# Patient Record
Sex: Female | Born: 1945 | Race: White | Hispanic: No | Smoking: Never smoker
Health system: Southern US, Community
[De-identification: ages and names within clinical notes are randomized; demographics above are authoritative.]

## PROBLEM LIST (undated history)

## (undated) DIAGNOSIS — E119 Type 2 diabetes mellitus without complications: Secondary | ICD-10-CM

## (undated) DIAGNOSIS — J45909 Unspecified asthma, uncomplicated: Secondary | ICD-10-CM

## (undated) DIAGNOSIS — F419 Anxiety disorder, unspecified: Secondary | ICD-10-CM

## (undated) DIAGNOSIS — I639 Cerebral infarction, unspecified: Secondary | ICD-10-CM

## (undated) DIAGNOSIS — I10 Essential (primary) hypertension: Secondary | ICD-10-CM

## (undated) HISTORY — PX: OTHER SURGICAL HISTORY: SHX169

## (undated) HISTORY — PX: CHOLECYSTECTOMY: SHX55

## (undated) HISTORY — DX: Anxiety disorder, unspecified: F41.9

---

## 2006-12-16 ENCOUNTER — Ambulatory Visit: Payer: Self-pay | Admitting: Family Medicine

## 2007-03-29 DIAGNOSIS — F419 Anxiety disorder, unspecified: Secondary | ICD-10-CM | POA: Insufficient documentation

## 2007-03-29 DIAGNOSIS — F411 Generalized anxiety disorder: Secondary | ICD-10-CM

## 2019-06-01 ENCOUNTER — Emergency Department (HOSPITAL_BASED_OUTPATIENT_CLINIC_OR_DEPARTMENT_OTHER): Payer: Medicare Other

## 2019-06-01 ENCOUNTER — Encounter (HOSPITAL_BASED_OUTPATIENT_CLINIC_OR_DEPARTMENT_OTHER): Payer: Self-pay | Admitting: *Deleted

## 2019-06-01 ENCOUNTER — Emergency Department (HOSPITAL_BASED_OUTPATIENT_CLINIC_OR_DEPARTMENT_OTHER)
Admission: EM | Admit: 2019-06-01 | Discharge: 2019-06-01 | Disposition: A | Discharge status: Home / Self Care | Payer: Medicare Other | Attending: Emergency Medicine | Admitting: Emergency Medicine

## 2019-06-01 ENCOUNTER — Emergency Department (HOSPITAL_COMMUNITY): Payer: Medicare Other

## 2019-06-01 ENCOUNTER — Other Ambulatory Visit: Payer: Self-pay

## 2019-06-01 DIAGNOSIS — W01190A Fall on same level from slipping, tripping and stumbling with subsequent striking against furniture, initial encounter: Secondary | ICD-10-CM | POA: Diagnosis not present

## 2019-06-01 DIAGNOSIS — J45909 Unspecified asthma, uncomplicated: Secondary | ICD-10-CM | POA: Diagnosis not present

## 2019-06-01 DIAGNOSIS — M79674 Pain in right toe(s): Secondary | ICD-10-CM | POA: Diagnosis not present

## 2019-06-01 DIAGNOSIS — E860 Dehydration: Secondary | ICD-10-CM | POA: Diagnosis not present

## 2019-06-01 DIAGNOSIS — I10 Essential (primary) hypertension: Secondary | ICD-10-CM | POA: Diagnosis not present

## 2019-06-01 DIAGNOSIS — N390 Urinary tract infection, site not specified: Secondary | ICD-10-CM | POA: Diagnosis not present

## 2019-06-01 DIAGNOSIS — Y939 Activity, unspecified: Secondary | ICD-10-CM | POA: Insufficient documentation

## 2019-06-01 DIAGNOSIS — Z79899 Other long term (current) drug therapy: Secondary | ICD-10-CM | POA: Diagnosis not present

## 2019-06-01 DIAGNOSIS — R4182 Altered mental status, unspecified: Secondary | ICD-10-CM | POA: Diagnosis present

## 2019-06-01 DIAGNOSIS — Y999 Unspecified external cause status: Secondary | ICD-10-CM | POA: Insufficient documentation

## 2019-06-01 DIAGNOSIS — Y929 Unspecified place or not applicable: Secondary | ICD-10-CM | POA: Diagnosis not present

## 2019-06-01 HISTORY — DX: Cerebral infarction, unspecified: I63.9

## 2019-06-01 HISTORY — DX: Unspecified asthma, uncomplicated: J45.909

## 2019-06-01 HISTORY — DX: Type 2 diabetes mellitus without complications: E11.9

## 2019-06-01 HISTORY — DX: Essential (primary) hypertension: I10

## 2019-06-01 LAB — URINALYSIS, ROUTINE W REFLEX MICROSCOPIC
Bilirubin Urine: NEGATIVE
Glucose, UA: NEGATIVE mg/dL
Ketones, ur: NEGATIVE mg/dL
Nitrite: NEGATIVE
Protein, ur: NEGATIVE mg/dL
Specific Gravity, Urine: 1.01 (ref 1.005–1.030)
pH: 7.5 (ref 5.0–8.0)

## 2019-06-01 LAB — COMPREHENSIVE METABOLIC PANEL
ALT: 22 U/L (ref 0–44)
AST: 32 U/L (ref 15–41)
Albumin: 4.1 g/dL (ref 3.5–5.0)
Alkaline Phosphatase: 85 U/L (ref 38–126)
Anion gap: 16 — ABNORMAL HIGH (ref 5–15)
BUN: 7 mg/dL — ABNORMAL LOW (ref 8–23)
CO2: 20 mmol/L — ABNORMAL LOW (ref 22–32)
Calcium: 9.3 mg/dL (ref 8.9–10.3)
Chloride: 97 mmol/L — ABNORMAL LOW (ref 98–111)
Creatinine, Ser: 0.58 mg/dL (ref 0.44–1.00)
GFR calc Af Amer: >60 mL/min (ref >60)
GFR calc non Af Amer: >60 mL/min (ref >60)
Glucose, Bld: 185 mg/dL — ABNORMAL HIGH (ref 70–99)
Potassium: 3.2 mmol/L — ABNORMAL LOW (ref 3.5–5.1)
Sodium: 133 mmol/L — ABNORMAL LOW (ref 135–145)
Total Bilirubin: 0.4 mg/dL (ref 0.3–1.2)
Total Protein: 7.9 g/dL (ref 6.5–8.1)

## 2019-06-01 LAB — CBC WITH DIFFERENTIAL/PLATELET
Abs Immature Granulocytes: 0.09 10*3/uL — ABNORMAL HIGH (ref 0.00–0.07)
Basophils Absolute: 0 10*3/uL (ref 0.0–0.1)
Basophils Relative: 0 %
Eosinophils Absolute: 0 10*3/uL (ref 0.0–0.5)
Eosinophils Relative: 0 %
HCT: 47.5 % — ABNORMAL HIGH (ref 36.0–46.0)
Hemoglobin: 15.5 g/dL — ABNORMAL HIGH (ref 12.0–15.0)
Immature Granulocytes: 0 %
Lymphocytes Relative: 7 %
Lymphs Abs: 1.4 10*3/uL (ref 0.7–4.0)
MCH: 27.9 pg (ref 26.0–34.0)
MCHC: 32.6 g/dL (ref 30.0–36.0)
MCV: 85.6 fL (ref 80.0–100.0)
Monocytes Absolute: 1.4 10*3/uL — ABNORMAL HIGH (ref 0.1–1.0)
Monocytes Relative: 7 %
Neutro Abs: 17.2 10*3/uL — ABNORMAL HIGH (ref 1.7–7.7)
Neutrophils Relative %: 86 %
Platelets: 394 10*3/uL (ref 150–400)
RBC: 5.55 MIL/uL — ABNORMAL HIGH (ref 3.87–5.11)
RDW: 13.3 % (ref 11.5–15.5)
WBC: 20.1 10*3/uL — ABNORMAL HIGH (ref 4.0–10.5)
nRBC: 0 % (ref 0.0–0.2)

## 2019-06-01 LAB — URINALYSIS, MICROSCOPIC (REFLEX)

## 2019-06-01 LAB — CBG MONITORING, ED: Glucose-Capillary: 181 mg/dL — ABNORMAL HIGH (ref 70–99)

## 2019-06-01 LAB — LACTIC ACID, PLASMA: Lactic Acid, Venous: 1.2 mmol/L (ref 0.5–1.9)

## 2019-06-01 IMAGING — DX DG CHEST 2V
2 series · 2 of 2 positions shown · non-contrast
Comparison: None.

CLINICAL DATA: Cough.

EXAM:
CHEST - 2 VIEW

[chest lat]
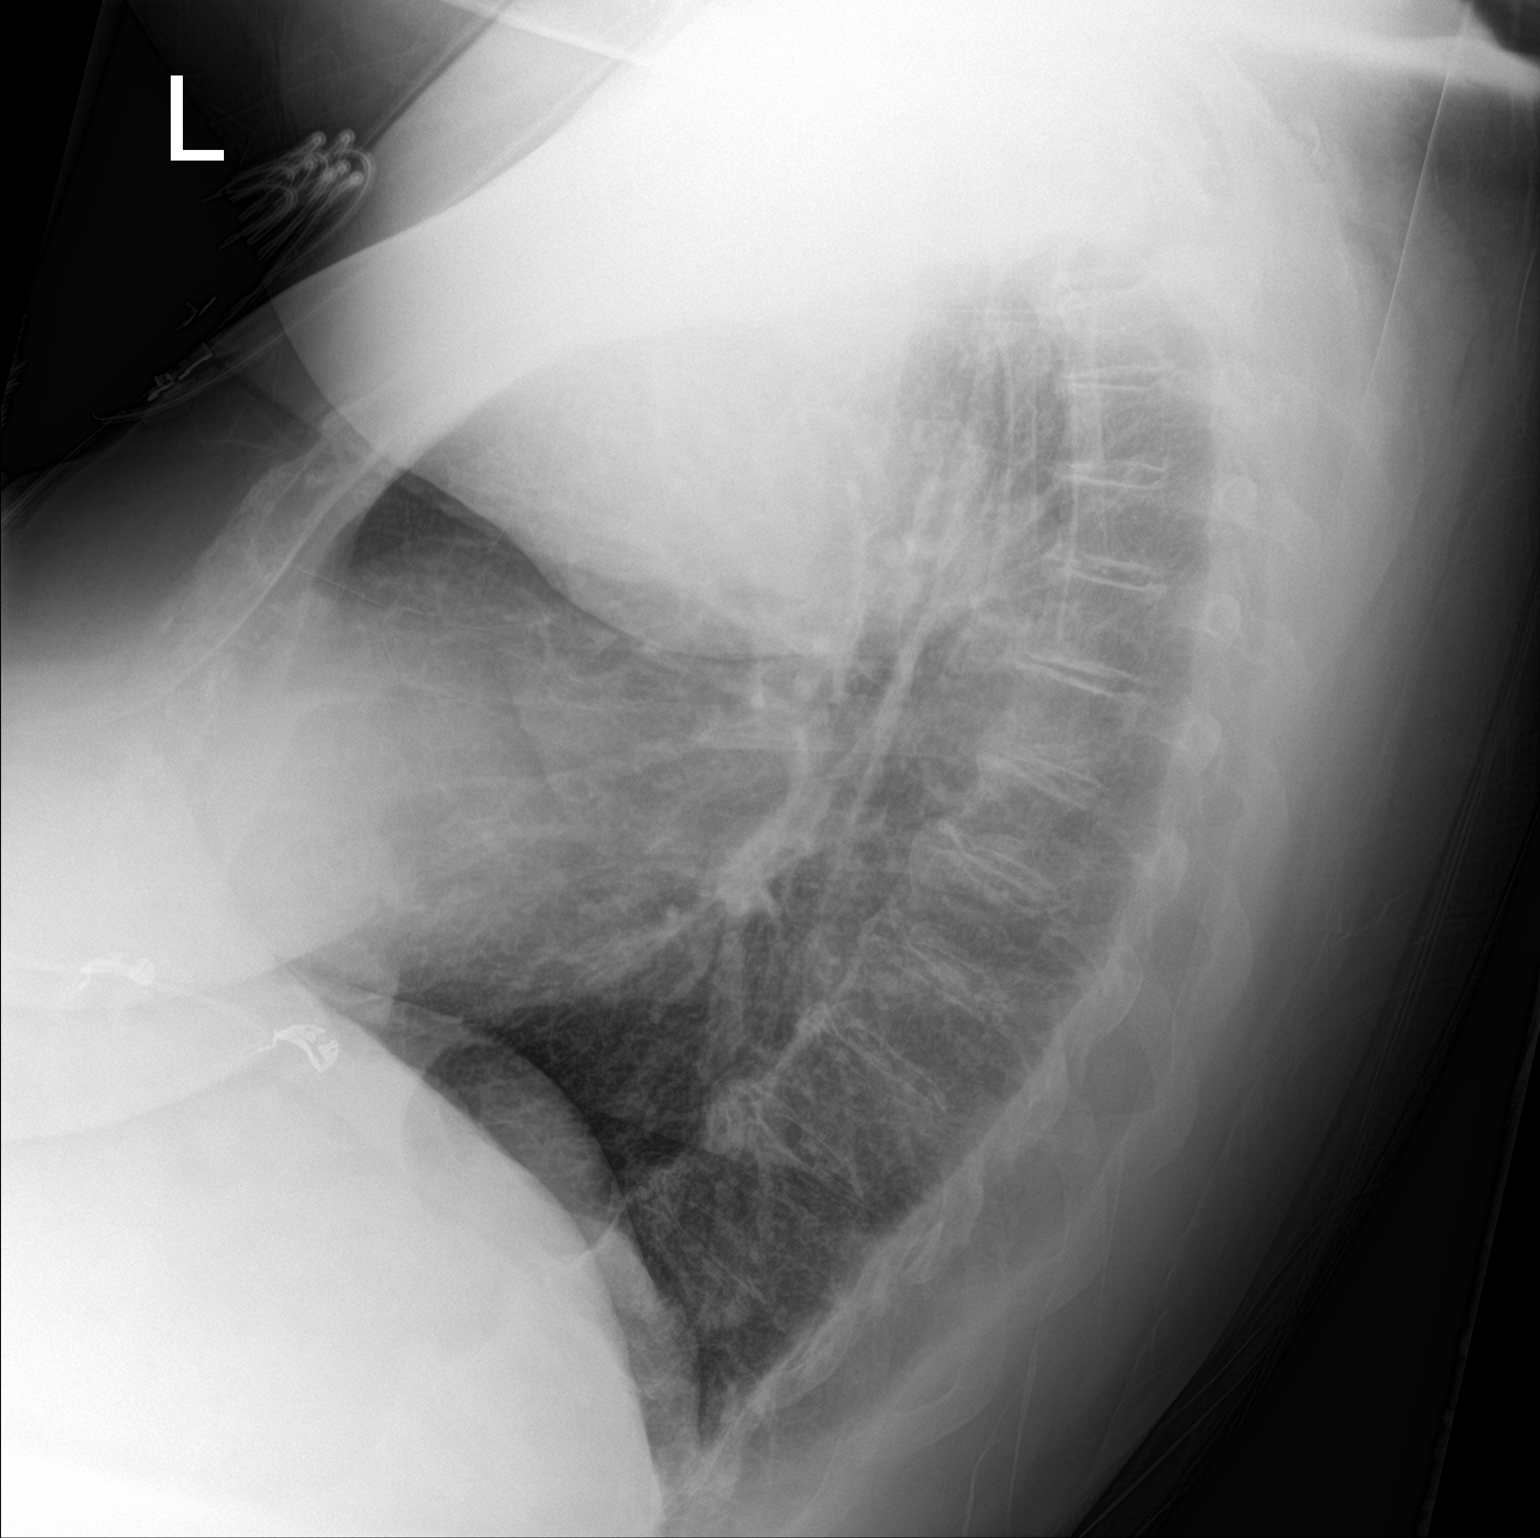

[chest ap strecther]
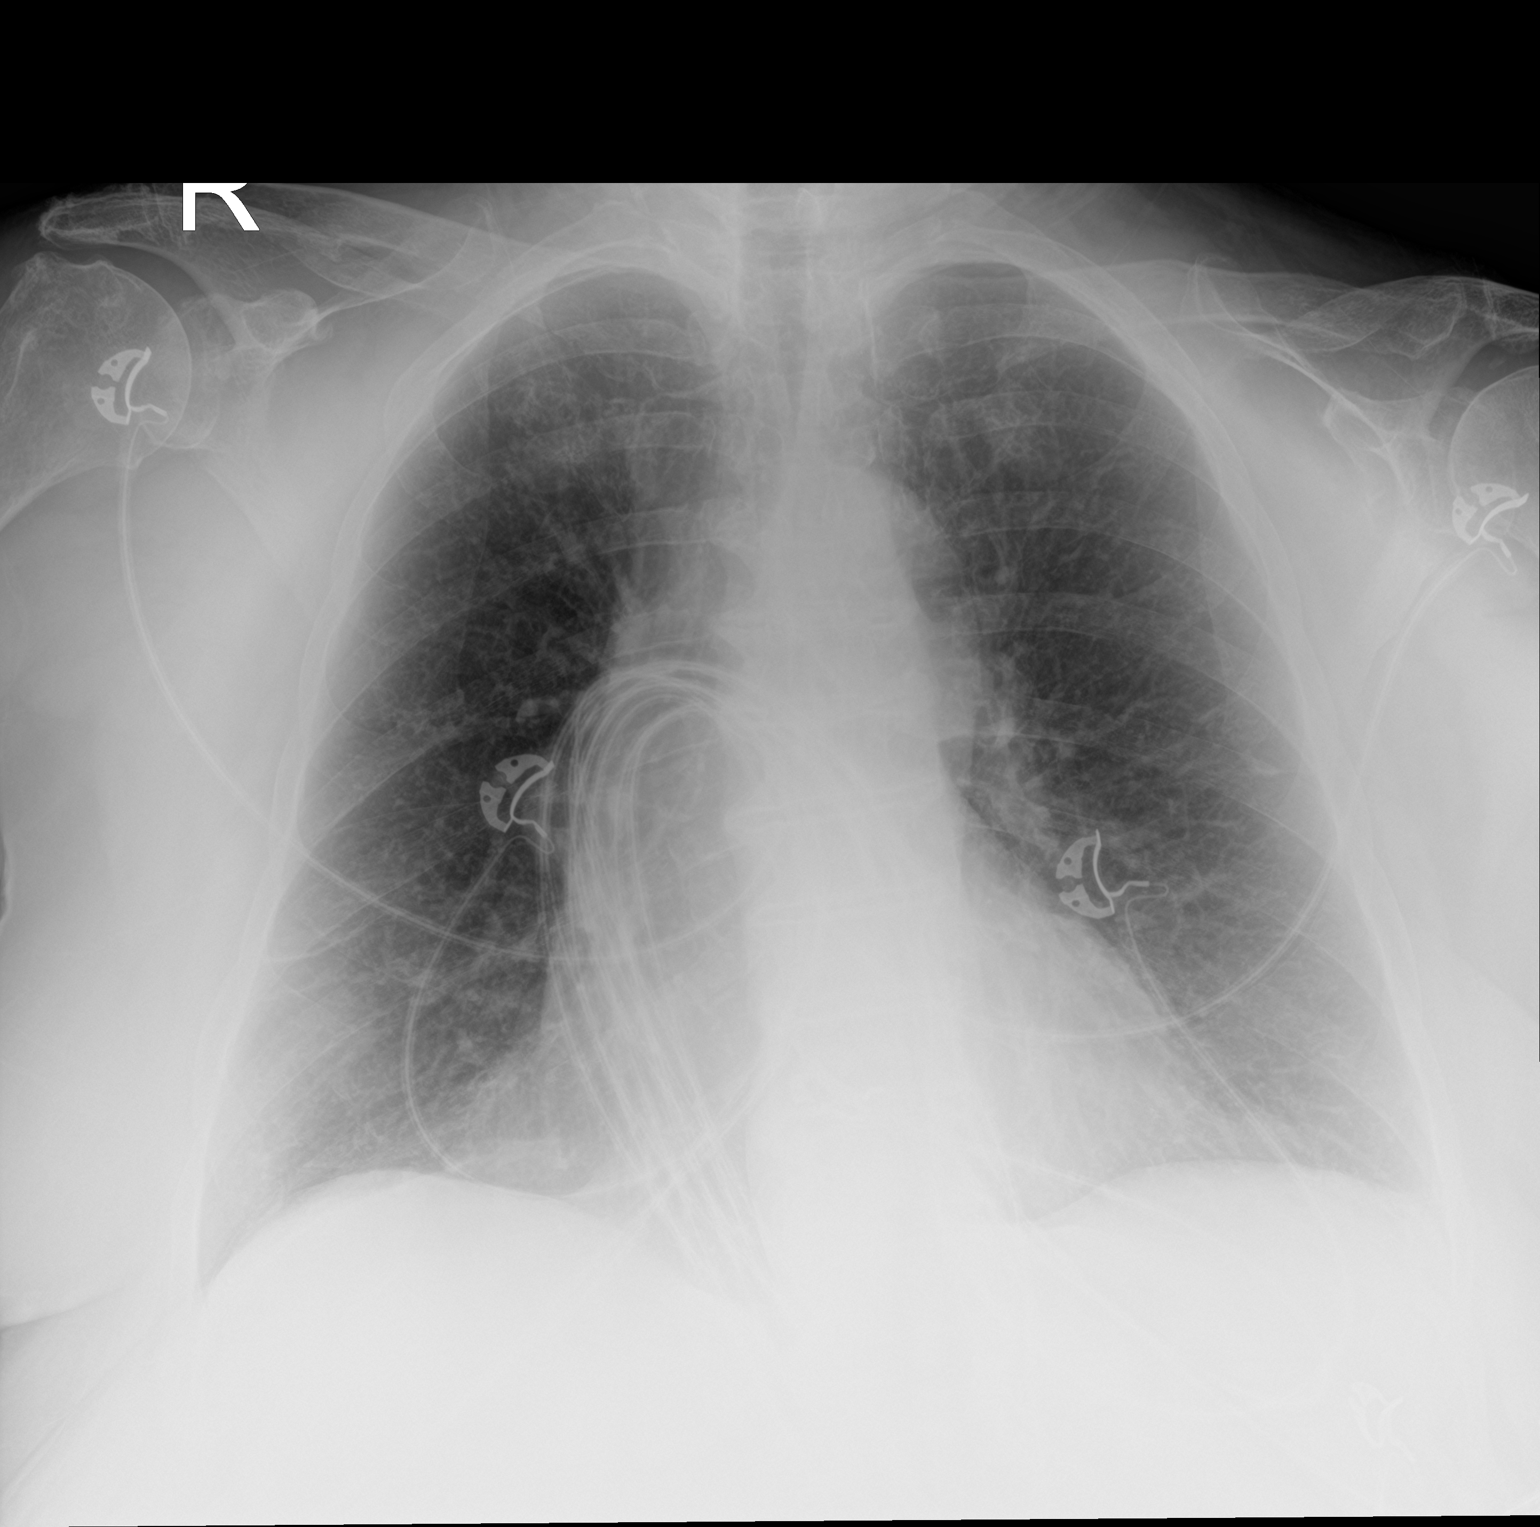

[2 of 2 positions shown; findings below may reference images not displayed]

FINDINGS: Heart size and pulmonary vascularity are normal and the lungs are
clear. No effusions. No significant bone abnormality.
IMPRESSION: No active cardiopulmonary disease.

## 2019-06-01 IMAGING — DX DG TOE 3RD 2+V*R*
3 series · 3 of 3 positions shown · non-contrast
Comparison: None.

CLINICAL DATA: Injure right third toe.

EXAM:
RIGHT THIRD TOE

[foot ap]
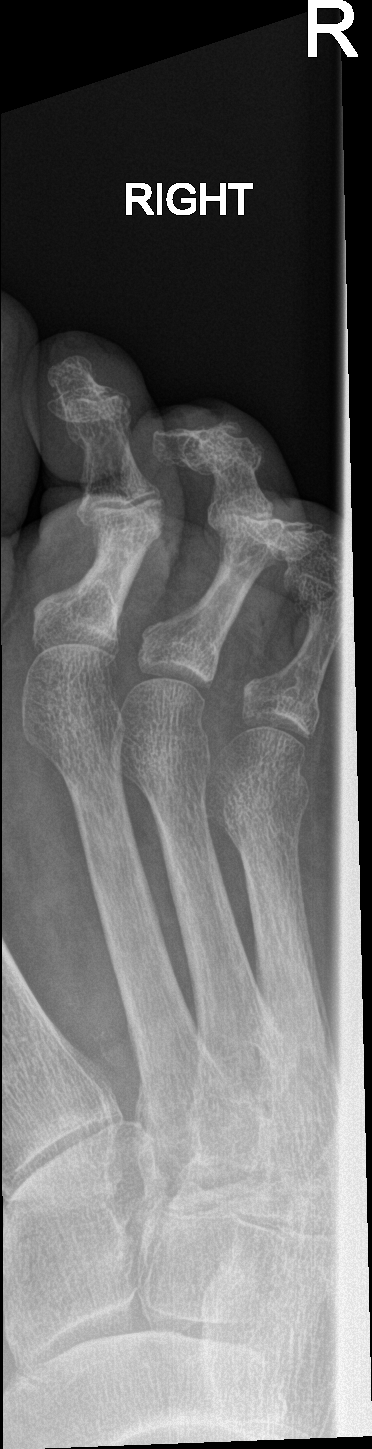

[foot lat]
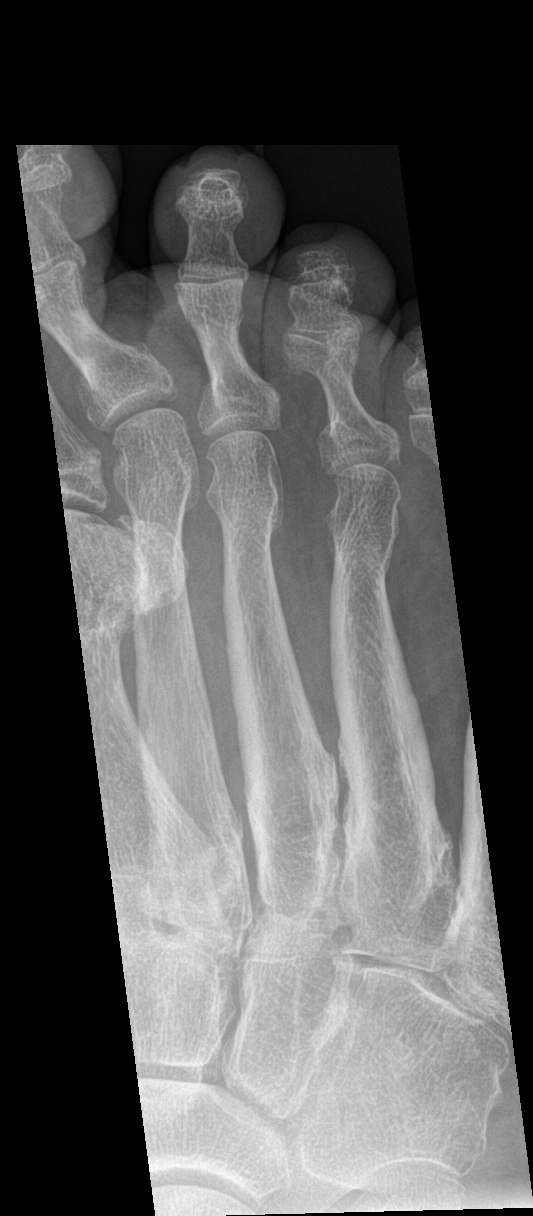

[foot obl]
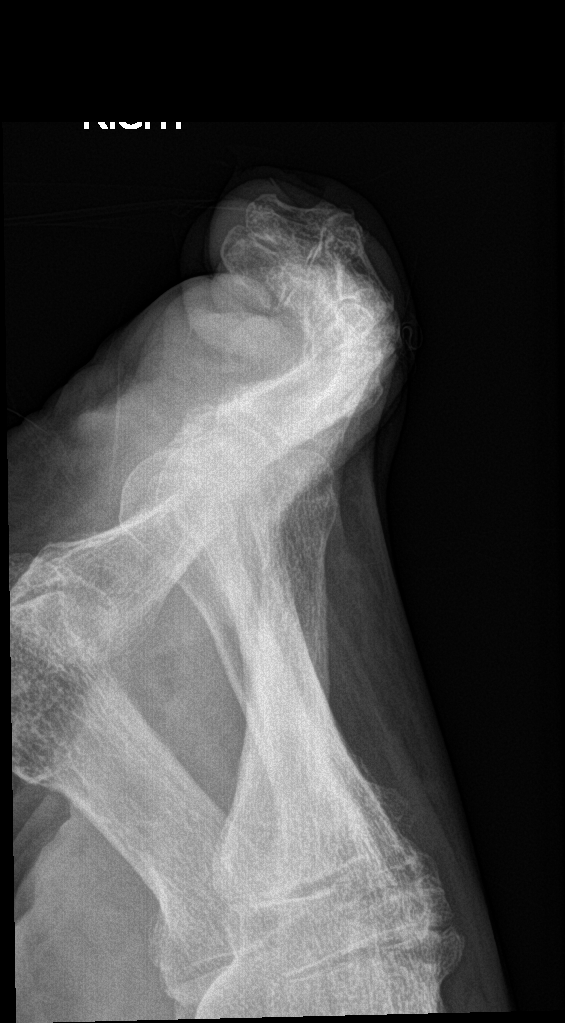

[3 of 3 positions shown; findings below may reference images not displayed]

FINDINGS: There is no evidence of fracture or dislocation. Exam is limited as
the toe is held in flexion on all views. Soft tissues are
unremarkable.
IMPRESSION: No gross acute fracture or dislocation.

## 2019-06-01 MED ORDER — LORAZEPAM 2 MG/ML IJ SOLN
1.0000 mg | Freq: Once | INTRAMUSCULAR | Status: AC
  Administered 2019-06-01: 1 mg via INTRAVENOUS
  Filled 2019-06-01: qty 1

## 2019-06-01 MED ORDER — SODIUM CHLORIDE 0.9 % IV BOLUS
1000.0000 mL | Freq: Once | INTRAVENOUS | Status: AC
  Administered 2019-06-01: 1000 mL via INTRAVENOUS

## 2019-06-01 MED ORDER — CEPHALEXIN 500 MG PO CAPS: 1000.0000 mg | ORAL_CAPSULE | Freq: Two times a day (BID) | ORAL | 0 refills | Status: DC

## 2019-06-01 NOTE — Discharge Instructions (Signed)
Return here as needed.  We will treat you for urinary tract infection.  Follow-up with your doctor.

## 2019-06-01 NOTE — ED Triage Notes (Signed)
Fell this morning, right middle toenail came off.  Patient's daughter stated that her mother is disoriented.

## 2019-06-01 NOTE — Progress Notes (Signed)
TOC CM received referral for [facility], contacted daughter, [name] left HIPAA compliant message for return call. [place], [place] ED TOC CM [phone]

## 2019-06-01 NOTE — ED Notes (Signed)
ED Provider at bedside. 

## 2019-06-01 NOTE — ED Notes (Signed)
Notified RN Elba Barman

## 2019-06-01 NOTE — ED Notes (Signed)
Dressing applied to right middle toe.  Patient is always crying and anxious.  Daughter at bedside.

## 2019-06-02 ENCOUNTER — Telehealth: Payer: Self-pay | Admitting: *Deleted

## 2019-06-02 NOTE — Telephone Encounter (Signed)
TOC CM attempted call to pt and number is not correct. Contacted dtr's number and left HIPAA compliant message for return call. Need to set up [facility]. [place], [place] ED TOC CM [phone]

## 2019-06-02 NOTE — Telephone Encounter (Signed)
TOC CM spoke to pt's dtr, [name]. Offered choice for [facility] and pt dtr agreeable to [place] for [facility]. Pt has a cane at home. States she recently moved from [place] and does not have a PCP. Will follow up with [facility] in [place] to arrange follow up appt. Will send notification to Dr Wynetta Emery to see if she will be willing to sign [place] orders until pt has established with a PCP in [place]. [place], [place] ED TOC CM [phone]

## 2019-06-04 ENCOUNTER — Telehealth: Payer: Self-pay | Admitting: *Deleted

## 2019-06-04 NOTE — Telephone Encounter (Signed)
TOC CM spoke to dtr, [name] to give information on MD appt on 06/06/2019 at 2 pm with Dr Luetta Nutting, at [place] [facility]. Updated dtr on [facility] with [place]. Sent message to [facility] liaison, Melissa about appt on 06/06/2019 . [place], [place] ED TOC CM [phone]

## 2019-06-05 NOTE — ED Provider Notes (Signed)
[facility] EMERGENCY DEPARTMENT Provider Note   CSN: [id number] Arrival date & time: 06/01/19  0909     History   Chief Complaint Chief Complaint  Patient presents with  . Fall  . Altered Mental Status    HPI Rebecca Barnes is a 73 y.o. female.     HPI Patient presents to the emergency department with an acute anxiety reaction and a fall where she struck her right third toe against the bed rail.  She states that the toe nail came off.  Patient states she is very upset because she has not taken her medicines today.  She states that nothing seems to make her condition better or worse.  She states she has no other complaints.  She states she did not fall and hit her head and has no other areas of pain other than her toe.  Patient has very upset and not giving me much history outside of this.  The daughter helps provide history for the patient as well. Past Medical History:  Diagnosis Date  . Asthma   . Diabetes mellitus without complication (HCC)   . Hypertension   . Stroke [facility])     Patient Active Problem List   Diagnosis Date Noted  . ANXIETY 03/29/2007    Past Surgical History:  Procedure Laterality Date  . arm fracture     arm surgery  . CHOLECYSTECTOMY       OB History    Gravida  1   Para      Term      Preterm      AB      Living  1     SAB      TAB      Ectopic      Multiple      Live Births               Home Medications    Prior to Admission medications   Medication Sig Start Date End Date Taking? Authorizing Provider  amLODipine (NORVASC) 5 MG tablet Take 5 mg by mouth daily.   Yes [provider]  busPIRone (BUSPAR) 5 MG tablet Take by mouth. 10/12/18  Yes [provider]  clonazePAM (KLONOPIN) 0.5 MG tablet Take 0.5 mg by mouth daily.   Yes [provider]  FLUoxetine (PROZAC) 20 MG capsule Take by mouth. 08/04/18  Yes [provider]  losartan (COZAAR) 100 MG tablet TAKE 1 TABLET  BY MOUTH EVERY DAY 01/08/19  Yes [provider]  metFORMIN (GLUCOPHAGE) 1000 MG tablet Take by mouth. 12/16/17  Yes [provider]  Multiple Vitamin (MULTI-VITAMIN) tablet Take by mouth.   Yes [provider]  rosuvastatin (CRESTOR) 5 MG tablet Take by mouth. 12/16/17  Yes [provider]  cephALEXin (KEFLEX) 500 MG capsule Take 2 capsules (1,000 mg total) by mouth 2 (two) times daily. 06/01/19   Kendrick Remigio, Cristal Deer, PA-C  fluticasone furoate-vilanterol (BREO ELLIPTA) 100-25 MCG/INH AEPB Inhale into the lungs. 03/17/18   [provider]    Family History Family History  Problem Relation Age of Onset  . Cancer Mother     Social History Social History   Tobacco Use  . Smoking status: Never Smoker  . Smokeless tobacco: Never Used  Substance Use Topics  . Alcohol use: Never    Frequency: Never  . Drug use: Yes    Types: Marijuana    Comment: ocassionally     Allergies   Patient has no known  allergies.   Review of Systems Review of Systems Level 5 caveat applies due to severe anxiety.  Physical Exam Updated Vital Signs BP (!) 143/80 (BP Location: Right Arm)   Pulse 86   Temp 98.3 F (36.8 C) (Oral)   Resp 16   Ht 5\' 6"  (1.676 m)   Wt 116.7 kg   SpO2 99%   BMI 41.53 kg/m   Physical Exam Vitals signs and nursing note reviewed.  Constitutional:      General: She is not in acute distress.    Appearance: She is well-developed.  HENT:     Head: Normocephalic and atraumatic.  Eyes:     Pupils: Pupils are equal, round, and reactive to light.  Neck:     Musculoskeletal: Normal range of motion and neck supple.  Cardiovascular:     Rate and Rhythm: Normal rate and regular rhythm.     Heart sounds: Normal heart sounds. No murmur. No friction rub. No gallop.   Pulmonary:     Effort: Pulmonary effort is normal. No respiratory distress.     Breath sounds: Normal breath sounds. No wheezing.  Abdominal:     General: Bowel sounds  are normal. There is no distension.     Palpations: Abdomen is soft.     Tenderness: There is no abdominal tenderness.  Skin:    General: Skin is warm and dry.     Capillary Refill: Capillary refill takes less than 2 seconds.     Findings: No erythema or rash.  Neurological:     Mental Status: She is alert and oriented to person, place, and time.     Motor: No abnormal muscle tone.     Coordination: Coordination normal.  Psychiatric:        Mood and Affect: Mood is anxious. Affect is tearful.        Behavior: Behavior is agitated.      ED Treatments / Results  Labs (all labs ordered are listed, but only abnormal results are displayed) Labs Reviewed  CBC WITH DIFFERENTIAL/PLATELET - Abnormal; Notable for the following components:      Result Value   WBC 20.1 (*)    RBC 5.55 (*)    Hemoglobin 15.5 (*)    HCT 47.5 (*)    Neutro Abs 17.2 (*)    Monocytes Absolute 1.4 (*)    Abs Immature Granulocytes 0.09 (*)    All other components within normal limits  COMPREHENSIVE METABOLIC PANEL - Abnormal; Notable for the following components:   Sodium 133 (*)    Potassium 3.2 (*)    Chloride 97 (*)    CO2 20 (*)    Glucose, Bld 185 (*)    BUN 7 (*)    Anion gap 16 (*)    All other components within normal limits  URINALYSIS, ROUTINE W REFLEX MICROSCOPIC - Abnormal; Notable for the following components:   Hgb urine dipstick TRACE (*)    Leukocytes,Ua TRACE (*)    All other components within normal limits  URINALYSIS, MICROSCOPIC (REFLEX) - Abnormal; Notable for the following components:   Bacteria, UA RARE (*)    All other components within normal limits  CBG MONITORING, ED - Abnormal; Notable for the following components:   Glucose-Capillary 181 (*)    All other components within normal limits  LACTIC ACID, PLASMA    EKG EKG Interpretation  Date/Time:  Friday June 01 2019 09:28:40 EDT Ventricular Rate:  112 PR Interval:    QRS Duration: 100 QT  Interval:  332 QTC  Calculation: 400 R Axis:   4 Text Interpretation:  Sinus tachycardia Consider right atrial enlargement Baseline wander in lead(s) II III aVR aVF V1 V2 V4 No STEMI  Confirmed by Octaviano Glow [phone]) on 06/01/2019 9:49:00 AM   Radiology No results found.  Procedures Procedures (including critical care time)  Medications Ordered in ED Medications  LORazepam (ATIVAN) injection 1 mg (1 mg Intravenous Given 06/01/19 1107)  sodium chloride 0.9 % bolus 1,000 mL (0 mLs Intravenous Stopped 06/01/19 1500)     Initial Impression / Assessment and Plan / ED Course  I have reviewed the triage vital signs and the nursing notes.  Pertinent labs & imaging results that were available during my care of the patient were reviewed by me and considered in my medical decision making (see chart for details).        Significantly calm down over her visit here in the emergency department her entire work-up today was negative other than a urinary tract infection.  I feel the patient is vastly improved and stable for discharge home.  Her vital signs have normalized and she is feeling better per her report.  Patient to be referred back to her primary care doctor. Final Clinical Impressions(s) / ED Diagnoses   Final diagnoses:  Lower urinary tract infectious disease  Dehydration    ED Discharge Orders         Ordered    cephALEXin (KEFLEX) 500 MG capsule  2 times daily     06/01/19 [address] PA-C 06/05/19 1622    Wyvonnia Dusky, MD 06/05/19 2040

## 2019-06-06 ENCOUNTER — Ambulatory Visit (INDEPENDENT_AMBULATORY_CARE_PROVIDER_SITE_OTHER): Payer: Medicare Other | Admitting: Family Medicine

## 2019-06-06 ENCOUNTER — Other Ambulatory Visit: Payer: Self-pay

## 2019-06-06 ENCOUNTER — Encounter: Payer: Self-pay | Admitting: Family Medicine

## 2019-06-06 VITALS — HR 94 | Temp 98.1°F | Ht 65.0 in | Wt 248.8 lb

## 2019-06-06 DIAGNOSIS — R209 Unspecified disturbances of skin sensation: Secondary | ICD-10-CM

## 2019-06-06 DIAGNOSIS — D72829 Elevated white blood cell count, unspecified: Secondary | ICD-10-CM | POA: Diagnosis not present

## 2019-06-06 DIAGNOSIS — Z23 Encounter for immunization: Secondary | ICD-10-CM

## 2019-06-06 DIAGNOSIS — F418 Other specified anxiety disorders: Secondary | ICD-10-CM

## 2019-06-06 DIAGNOSIS — G3281 Cerebellar ataxia in diseases classified elsewhere: Secondary | ICD-10-CM

## 2019-06-06 DIAGNOSIS — R4789 Other speech disturbances: Secondary | ICD-10-CM

## 2019-06-06 DIAGNOSIS — E559 Vitamin D deficiency, unspecified: Secondary | ICD-10-CM | POA: Diagnosis not present

## 2019-06-06 DIAGNOSIS — E119 Type 2 diabetes mellitus without complications: Secondary | ICD-10-CM

## 2019-06-06 DIAGNOSIS — R531 Weakness: Secondary | ICD-10-CM

## 2019-06-06 LAB — COMPREHENSIVE METABOLIC PANEL
ALT: 31 U/L (ref 0–35)
AST: 37 U/L (ref 0–37)
Albumin: 4.1 g/dL (ref 3.5–5.2)
Alkaline Phosphatase: 79 U/L (ref 39–117)
BUN: 9 mg/dL (ref 6–23)
CO2: 24 mEq/L (ref 19–32)
Calcium: 9.9 mg/dL (ref 8.4–10.5)
Chloride: 94 mEq/L — ABNORMAL LOW (ref 96–112)
Creatinine, Ser: 0.55 mg/dL (ref 0.40–1.20)
GFR: 108.31 mL/min (ref >60.00)
Glucose, Bld: 119 mg/dL — ABNORMAL HIGH (ref 70–99)
Potassium: 3.6 mEq/L (ref 3.5–5.1)
Sodium: 129 mEq/L — ABNORMAL LOW (ref 135–145)
Total Bilirubin: 0.4 mg/dL (ref 0.2–1.2)
Total Protein: 7.5 g/dL (ref 6.0–8.3)

## 2019-06-06 LAB — LIPID PANEL
Cholesterol: 198 mg/dL (ref 0–200)
HDL: 72.2 mg/dL (ref >39.00)
LDL Cholesterol: 91 mg/dL (ref 0–99)
NonHDL: 126.15
Total CHOL/HDL Ratio: 3
Triglycerides: 178 mg/dL — ABNORMAL HIGH (ref 0.0–149.0)
VLDL: 35.6 mg/dL (ref 0.0–40.0)

## 2019-06-06 LAB — CBC WITH DIFFERENTIAL/PLATELET
Basophils Absolute: 0 10*3/uL (ref 0.0–0.1)
Basophils Relative: 0.4 % (ref 0.0–3.0)
Eosinophils Absolute: 0.1 10*3/uL (ref 0.0–0.7)
Eosinophils Relative: 1.2 % (ref 0.0–5.0)
HCT: 42.3 % (ref 36.0–46.0)
Hemoglobin: 14.5 g/dL (ref 12.0–15.0)
Lymphocytes Relative: 27.9 % (ref 12.0–46.0)
Lymphs Abs: 3.1 10*3/uL (ref 0.7–4.0)
MCHC: 34.2 g/dL (ref 30.0–36.0)
MCV: 84.2 fl (ref 78.0–100.0)
Monocytes Absolute: 0.9 10*3/uL (ref 0.1–1.0)
Monocytes Relative: 8.2 % (ref 3.0–12.0)
Neutro Abs: 6.8 10*3/uL (ref 1.4–7.7)
Neutrophils Relative %: 62.3 % (ref 43.0–77.0)
Platelets: 392 10*3/uL (ref 150.0–400.0)
RBC: 5.03 Mil/uL (ref 3.87–5.11)
RDW: 13.4 % (ref 11.5–15.5)
WBC: 11 10*3/uL — ABNORMAL HIGH (ref 4.0–10.5)

## 2019-06-06 LAB — VITAMIN B12: Vitamin B-12: 319 pg/mL (ref 211–911)

## 2019-06-06 LAB — HEMOGLOBIN A1C: Hgb A1c MFr Bld: 7.4 % — ABNORMAL HIGH (ref 4.6–6.5)

## 2019-06-06 LAB — TSH: TSH: 1.53 u[IU]/mL (ref 0.35–4.50)

## 2019-06-06 LAB — VITAMIN D 25 HYDROXY (VIT D DEFICIENCY, FRACTURES): VITD: 39.94 ng/mL (ref 30.00–100.00)

## 2019-06-06 MED ORDER — CLONAZEPAM 0.5 MG PO TABS: 0.5000 mg | ORAL_TABLET | Freq: Two times a day (BID) | ORAL | 2 refills | Status: DC | PRN

## 2019-06-06 MED ORDER — FLUOXETINE HCL 20 MG PO CAPS: 20.0000 mg | ORAL_CAPSULE | Freq: Every day | ORAL | 3 refills | Status: DC

## 2019-06-06 NOTE — Patient Instructions (Signed)
It was very nice to meet you today! We'll be in touch with lab results You will be contacted to schedule MRI.   Please contact us with any questions.

## 2019-06-07 ENCOUNTER — Encounter: Payer: Self-pay | Admitting: Family Medicine

## 2019-06-07 DIAGNOSIS — R209 Unspecified disturbances of skin sensation: Secondary | ICD-10-CM | POA: Insufficient documentation

## 2019-06-07 DIAGNOSIS — E1351 Other specified diabetes mellitus with diabetic peripheral angiopathy without gangrene: Secondary | ICD-10-CM | POA: Insufficient documentation

## 2019-06-07 DIAGNOSIS — R531 Weakness: Secondary | ICD-10-CM | POA: Insufficient documentation

## 2019-06-07 DIAGNOSIS — D72829 Elevated white blood cell count, unspecified: Secondary | ICD-10-CM | POA: Insufficient documentation

## 2019-06-07 DIAGNOSIS — F418 Other specified anxiety disorders: Secondary | ICD-10-CM | POA: Insufficient documentation

## 2019-06-07 DIAGNOSIS — E119 Type 2 diabetes mellitus without complications: Secondary | ICD-10-CM | POA: Insufficient documentation

## 2019-06-07 DIAGNOSIS — R4789 Other speech disturbances: Secondary | ICD-10-CM | POA: Insufficient documentation

## 2019-06-07 NOTE — Assessment & Plan Note (Signed)
-  Some improvement with tx of UTI.  Has HH PT and nursing ordered.

## 2019-06-07 NOTE — Assessment & Plan Note (Signed)
-  Recheck WBC after starting tx from UTI, likely from combination of infection and hemoconcentration.

## 2019-06-07 NOTE — Assessment & Plan Note (Signed)
-  Continue fluoxetine with buspar and clonazepam as needed.  -Given information of [place] behavioral health counseling.

## 2019-06-07 NOTE — Assessment & Plan Note (Signed)
-  Update a1c today.  -Check b12 given sensation disturbance and gait issues with long term metformin use.

## 2019-06-07 NOTE — Progress Notes (Signed)
Rebecca Barnes - 73 y.o. female MRN [id number]  Date of birth: Aug 10, 1946  Subjective Chief Complaint  Patient presents with  . Establish Care    Pt had a bad fall last friday, and now she's having more falls than usual.    HPI Rebecca Barnes is a 73 y.o. female with history of HTN, T2DM, asthma and anxiety with depression here today for initial visit and hospital f/u.  She recently move from [place] to [place] and is living with her daughter Rebecca Barnes who accompanies her today.  She was recently in the hospital due to fall and confusion. She was found to have UA consistent with UTI and elevated WBC count.  She was started on cephalexin and has had some improvement since starting this.  She does have some continued generalzed weakness and trouble with word findings, mild confusion at times.  She does have anxiety and this is making this a little worse, they are interested in having her see a therapist.  She does take clonazepam as needed but she has been on this for several years without side effects and dosage has not changed. She denies urinary symptoms at this time.  She has not had any hypoglycemia and BP has been well controlled. 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  ROS:  A comprehensive ROS was completed and negative except as noted per HPI    Allergies  Allergen Reactions  . Sitagliptin     Other reaction(s): DIZZINESS. (JANUVIA)  . Sulfa Antibiotics     (SULFA)    Past Medical History:  Diagnosis Date  . Anxiety   . Asthma   . Diabetes mellitus without complication ([place])   . Hypertension   . Stroke [facility])     Past Surgical History:  Procedure Laterality Date  . arm fracture     arm surgery  . CHOLECYSTECTOMY      Social History   Socioeconomic History  . Marital status: Married    Spouse name: Not on file  . Number of children: Not on file  . Years of education: Not on file  . Highest education level: Not on file   Occupational History  . Not on file  Social Needs  . Financial resource strain: Not on file  . Food insecurity    Worry: Not on file    Inability: Not on file  . Transportation needs    Medical: Not on file    Non-medical: Not on file  Tobacco Use  . Smoking status: Never Smoker  . Smokeless tobacco: Never Used  Substance and Sexual Activity  . Alcohol use: Never    Frequency: Never  . Drug use: Yes    Types: Marijuana    Comment: ocassionally  . Sexual activity: Not Currently  Lifestyle  . Physical activity    Days per week: Not on file    Minutes per session: Not on file  . Stress: Not on file  Relationships  . Social Herbalist on phone: Not on file    Gets together: Not on file    Attends religious service: Not on file    Active member of club or organization: Not on file    Attends meetings of clubs or organizations: Not on file    Relationship status: Not on file  Other Topics Concern  . Not on file  Social History Narrative  . Not on file    Family History  Problem  Relation Age of Onset  . Cancer Mother     Health Maintenance  Topic Date Due  . Hepatitis C Screening  August 26, 1945  . MAMMOGRAM  04/22/1996  . COLONOSCOPY  04/22/1996  . DEXA SCAN  04/23/2011  . TETANUS/TDAP  09/17/2026  . INFLUENZA VACCINE  Completed  . PNA vac Low Risk Adult  Completed    ----------------------------------------------------------------------------------------------------------------------------------------------------------------------------------------------------------------- Physical Exam Pulse 94   Temp 98.1 F (36.7 C)   Ht 5\' 5"  (1.651 m)   Wt 248 lb 12.8 oz (112.9 kg)   SpO2 96%   BMI 41.40 kg/m   Physical Exam Constitutional:      Appearance: She is obese. She is not ill-appearing.  HENT:     Head: Normocephalic and atraumatic.     Mouth/Throat:     Mouth: Mucous membranes are moist.  Eyes:     General: No scleral icterus. Neck:      Musculoskeletal: Neck supple. No muscular tenderness.  Cardiovascular:     Rate and Rhythm: Normal rate and regular rhythm.  Pulmonary:     Effort: Pulmonary effort is normal.     Breath sounds: Normal breath sounds.  Abdominal:     General: Abdomen is flat.  Neurological:     Mental Status: She is alert.     Cranial Nerves: No cranial nerve deficit.     Comments: Generalized weakness with slow gait. LLE 4/5 compared to 5/5 on R.  LUE and RUE with normal strength.   Psychiatric:        Mood and Affect: Mood normal.        Behavior: Behavior normal.     ------------------------------------------------------------------------------------------------------------------------------------------------------------------------------------------------------------------- Assessment and Plan  Anxiety with depression -Continue fluoxetine with buspar and clonazepam as needed.  -Given information of [place] behavioral health counseling.   Type 2 diabetes mellitus without complication, without long-term current use of insulin (HCC) -Update a1c today.  -Check b12 given sensation disturbance and gait issues with long term metformin use.   Word finding difficulty -May be related to metabolic encephalopathy from UTI but really hasn't had a whole lot of improvement with treatment of UTI.   -Will order MRI brain.    Leukocytosis -Recheck WBC after starting tx from UTI, likely from combination of infection and hemoconcentration.   Generalized weakness -Some improvement with tx of UTI.  Has HH PT and nursing ordered.

## 2019-06-07 NOTE — Assessment & Plan Note (Signed)
-  May be related to metabolic encephalopathy from UTI but really hasn't had a whole lot of improvement with treatment of UTI.   -Will order MRI brain.

## 2019-06-08 ENCOUNTER — Telehealth: Payer: Self-pay | Admitting: Family Medicine

## 2019-06-08 NOTE — Telephone Encounter (Signed)
Home Health Verbal Orders - Caller/Agency: [facility] Number: [phone] Requesting PT Frequency: 1x a week for 1 week  2x's a week for 3 weeks 1x a week for 5 weeks

## 2019-06-11 NOTE — Telephone Encounter (Signed)
Talked to cecilia and let her know VO was okay per pcp.

## 2019-06-11 NOTE — Telephone Encounter (Signed)
Ok to give VO? 

## 2019-06-12 ENCOUNTER — Ambulatory Visit: Payer: Self-pay | Admitting: Family Medicine

## 2019-06-12 NOTE — Progress Notes (Signed)
Please let patient know that her labs show   - her sodium is a little low.  This appears to be consistent with prior levels and may be related to fluoxetine.  We don't need to do anything about this at this time but we'll continue to monitor with routine labs.   -Her WBC have improved -Cholesterol levels look good -A1c is up a little bit from 6.6 in February to 7.4 now.  Lets try following a low carb diet before adding additional medications.  I will plan to see her back in about 3 months.

## 2019-06-14 ENCOUNTER — Telehealth: Payer: Self-pay | Admitting: Family Medicine

## 2019-06-14 NOTE — Telephone Encounter (Signed)
Left Rebecca Barnes a VM to give me a callback to discuss her questions.

## 2019-06-14 NOTE — Telephone Encounter (Signed)
Patient daughter called and said that they have not heard from her MRI schedule. She would like a call back with more information to when she will have it done.

## 2019-06-18 ENCOUNTER — Telehealth: Payer: Self-pay | Admitting: Family Medicine

## 2019-06-18 NOTE — Telephone Encounter (Addendum)
I called and left message on voicemail that [facility] called and left message on patient voicemail on 06/15/2019 to call and schedule MR. I left [employer] number on voicemail.

## 2019-06-19 ENCOUNTER — Telehealth: Payer: Self-pay

## 2019-06-19 NOTE — Telephone Encounter (Signed)
Home Health nurse aware/thx dmf

## 2019-06-19 NOTE — Telephone Encounter (Signed)
If not having any symptoms at this time I think we can have nurse or PT check at next scheduled visit.

## 2019-06-19 NOTE — Telephone Encounter (Signed)
[name] at [facility] -BP 180/90 sitting a few minutes -BP Standing 170/92  She stated to the nurse that she had taken her BP med about 3 hours prior to her coming to the home (amlodipine) Pt denies any Sx associated with HTN; she was upset after fecal incontinence which could be a contributing factor She takes Losartan in afternoon Plz advise what you would like nurse to do and may leave detailed message at [phone]/XJOI has PT going to help pt as well/thx dmf

## 2019-07-05 ENCOUNTER — Telehealth: Payer: Self-pay

## 2019-07-05 NOTE — Telephone Encounter (Signed)
Copied from [place] [phone]. Topic: General - Other >> Jul 05, 2019  4:54 PM Yvette Rack wrote: Reason for CRM: Pattie with [facility] stated pt is having a hard time affording the fluticasone furoate-vilanterol (BREO ELLIPTA) 100-25 MCG/INH AEPB and would like to know if there are any samples that pt can be given. Pattie stated if there are no samples available please send a refill request to pt pharmacy. Cb# [phone]

## 2019-07-06 ENCOUNTER — Other Ambulatory Visit: Payer: Self-pay

## 2019-07-06 MED ORDER — FLUOXETINE HCL 20 MG PO CAPS: 20.0000 mg | ORAL_CAPSULE | Freq: Every day | ORAL | 3 refills | Status: DC

## 2019-07-06 NOTE — Telephone Encounter (Signed)
We do not have samples at our clinic.  Ok to refill.

## 2019-07-09 ENCOUNTER — Other Ambulatory Visit: Payer: Self-pay | Admitting: Family Medicine

## 2019-07-09 ENCOUNTER — Telehealth: Payer: Self-pay | Admitting: Family Medicine

## 2019-07-09 ENCOUNTER — Other Ambulatory Visit: Payer: Self-pay

## 2019-07-09 MED ORDER — METFORMIN HCL 1000 MG PO TABS: 1000.0000 mg | ORAL_TABLET | Freq: Two times a day (BID) | ORAL | 2 refills | Status: DC

## 2019-07-09 NOTE — Telephone Encounter (Signed)
Pharm is calling and pt needs a refill on metformin 1000 mg. This med prescribed by her old md

## 2019-07-09 NOTE — Telephone Encounter (Signed)
Completed.

## 2019-07-14 ENCOUNTER — Ambulatory Visit
Admission: RE | Admit: 2019-07-14 | Discharge: 2019-07-14 | Disposition: A | Discharge status: Home / Self Care | Payer: Medicare Other | Source: Ambulatory Visit | Attending: Family Medicine | Admitting: Family Medicine

## 2019-07-14 ENCOUNTER — Other Ambulatory Visit: Payer: Self-pay

## 2019-07-14 DIAGNOSIS — R4789 Other speech disturbances: Secondary | ICD-10-CM

## 2019-07-14 DIAGNOSIS — G3281 Cerebellar ataxia in diseases classified elsewhere: Secondary | ICD-10-CM

## 2019-07-14 IMAGING — MR MR HEAD W/O CM
10 series · 48 of 48 positions shown · non-contrast
Comparison: None.

CLINICAL DATA: Cerebellar ataxia.  Word-finding difficulty.

EXAM:
MRI HEAD WITHOUT CONTRAST
TECHNIQUE: Multiplanar, multiecho pulse sequences of the brain and surrounding
structures were obtained without intravenous contrast.

[Series 6: T1 · sagittal · 4.0mm · 0.75mm/px · 3 of 31 slices shown (1 of 2)]
[im 1/31]
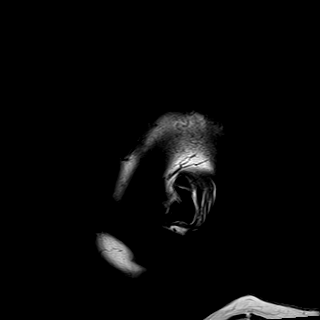
[im 16/31]
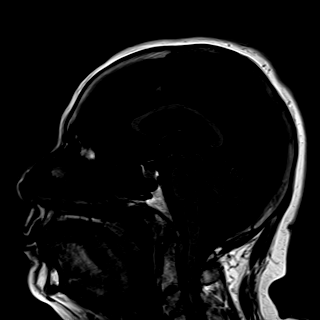
[im 31/31]
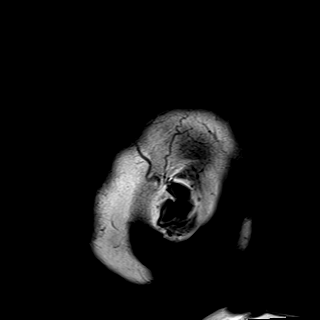

[Series 7: DWI · axial · 3.0mm · 1.50mm/px · z∈[-81,+63]mm · 7 of 84 slices shown (1 of 4)]
[im 1/84]
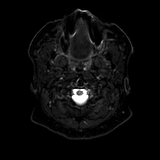
[im 14/84]
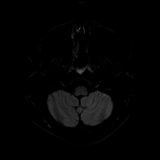
[im 28/84]
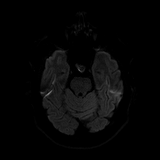
[im 42/84]
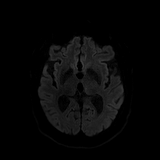
[im 56/84]
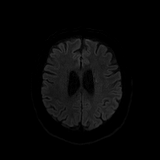
[im 70/84]
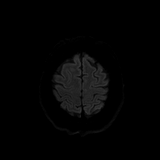
[im 84/84]
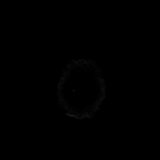

[Series 8: DWI · axial · 3.0mm · 1.50mm/px · z∈[-81,+63]mm · 3 of 42 slices shown (2 of 4)]
[im 1/42]
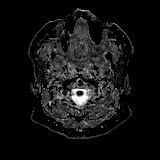
[im 21/42]
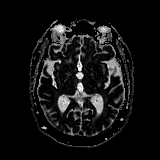
[im 42/42]
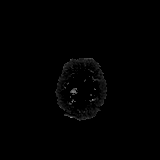

[Series 9: DWI · coronal · 5.0mm · 1.44mm/px · 5 of 60 slices shown (3 of 4)]
[im 1/60]
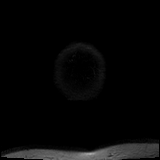
[im 15/60]
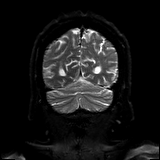
[im 30/60]
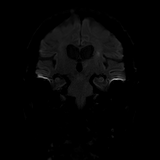
[im 45/60]
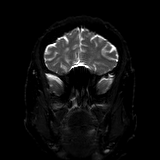
[im 60/60]
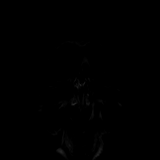

[Series 10: DWI · coronal · 5.0mm · 1.44mm/px · 2 of 30 slices shown (4 of 4)]
[im 1/30]
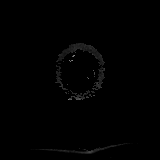
[im 30/30]
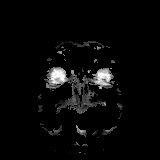

[Series 11: T2 · axial · 4.0mm · 0.38mm/px · z∈[-85,+67]mm · 2 of 31 slices shown (1 of 2)]
[im 1/31]
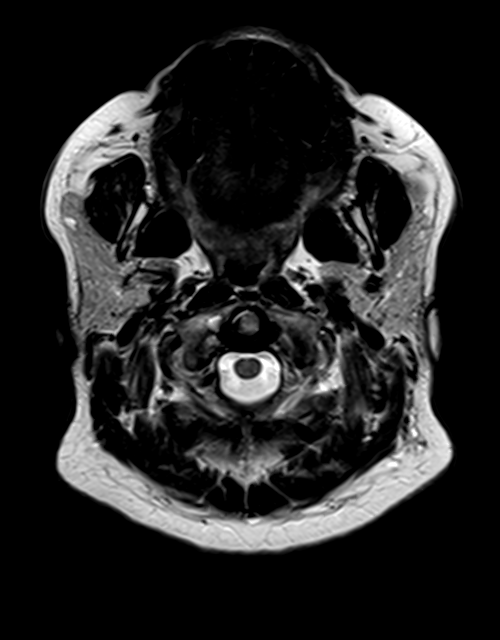
[im 31/31]
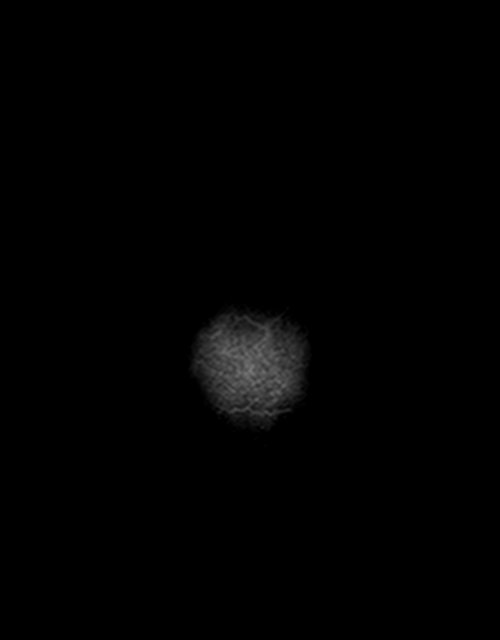

[Series 12: FLAIR · axial · 3.0mm · 0.75mm/px · z∈[-85,+67]mm · 2 of 27 slices shown]
[im 1/27]
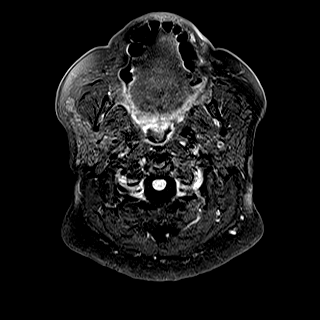
[im 27/27]
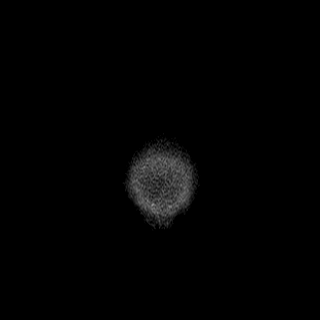

[Series 14: swi_images · axial · 1.5mm · 0.94mm/px · z∈[-85,+66]mm · 8 of 104 slices shown]
[im 1/104]
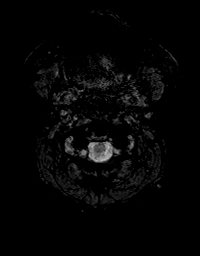
[im 15/104]
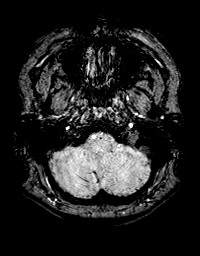
[im 30/104]
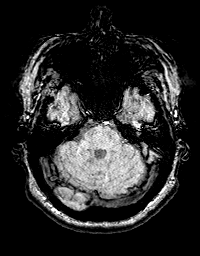
[im 45/104]
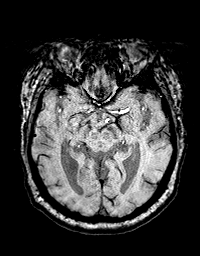
[im 59/104]
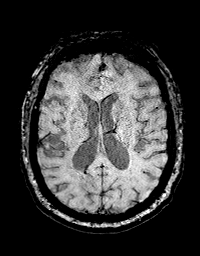
[im 74/104]
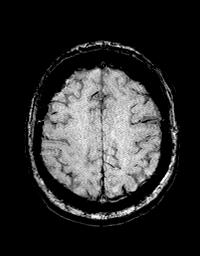
[im 89/104]
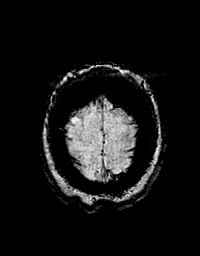
[im 104/104]
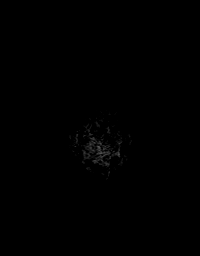

[Series 15: T1 · axial · 1.0mm · 0.94mm/px · z∈[-87,+68]mm · 13 of 160 slices shown (2 of 2)]
[im 1/160]
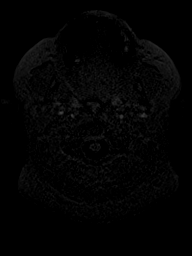
[im 14/160]
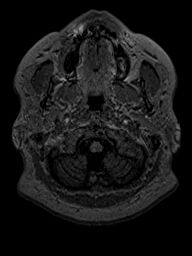
[im 27/160]
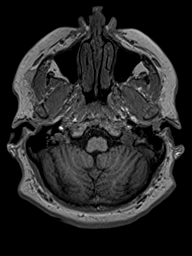
[im 40/160]
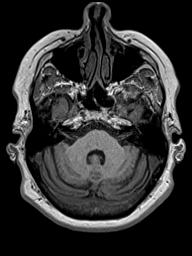
[im 54/160]
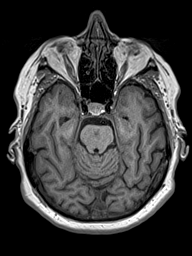
[im 67/160]
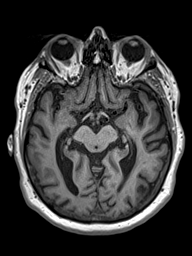
[im 80/160]
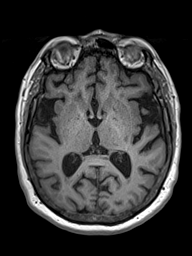
[im 93/160]
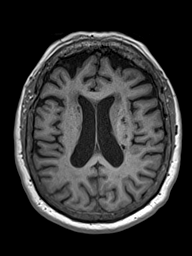
[im 107/160]
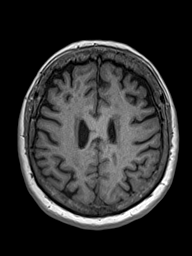
[im 120/160]
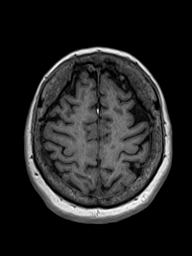
[im 133/160]
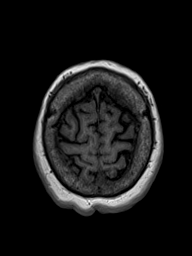
[im 146/160]
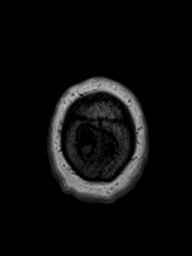
[im 160/160]
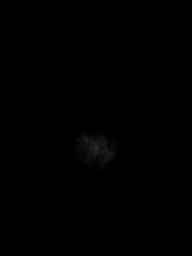

[Series 16: T2 · coronal · 4.5mm · 0.36mm/px · 3 of 33 slices shown (2 of 2)]
[im 1/33]
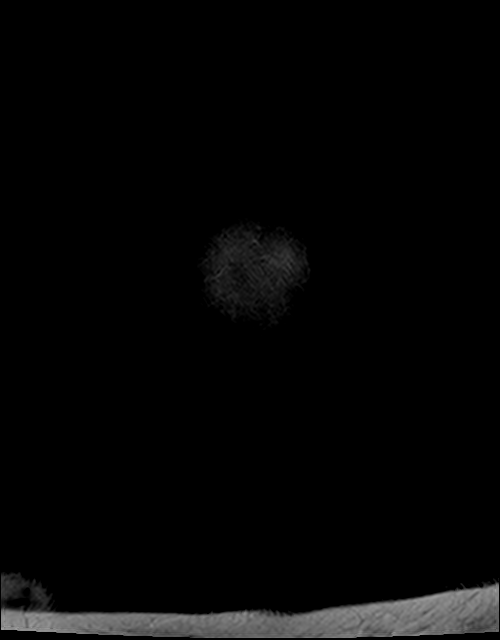
[im 17/33]
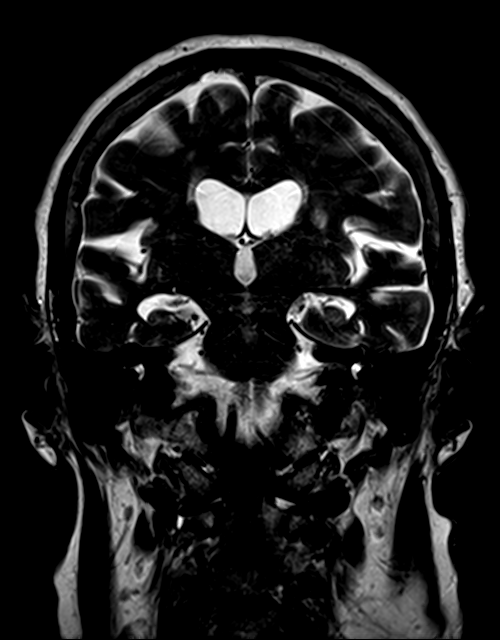
[im 33/33]
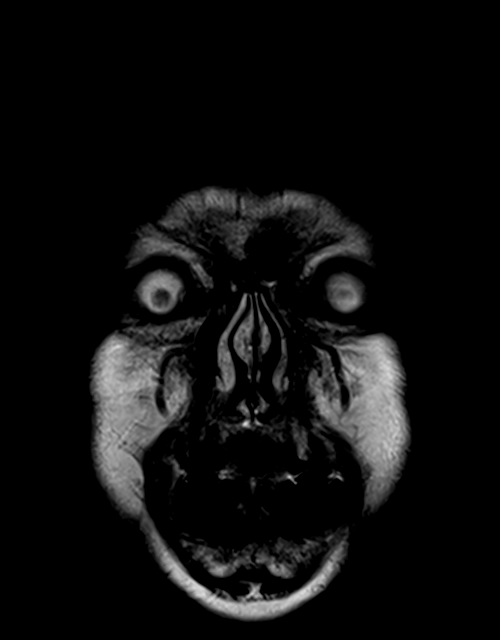

[48 of 48 positions shown; findings below may reference images not displayed]

FINDINGS: Brain: Mild cerebral atrophy. Cerebellar volume normal. Negative for
hydrocephalus. Negative for acute infarct. Moderate chronic
microvascular ischemic changes in the white matter. Chronic ischemia
in the left thalamus and central pons. Negative for hemorrhage mass
or fluid collection. No midline shift.

Vascular: Normal arterial flow voids

Skull and upper cervical spine: Negative

Sinuses/Orbits: Negative

Other: None
IMPRESSION: No acute abnormality

Cerebral atrophy with moderate chronic ischemic changes in the white
matter and pons.

## 2019-07-17 NOTE — Progress Notes (Signed)
Please let patient and/or daughter know that MRI shows some atrophy of the brain and chronic changes related to vascular disease likely from diabetes.  Has her gait or word finding difficulty improved at all?  If not I would recommend that we have her see neurology.

## 2019-09-10 ENCOUNTER — Telehealth: Payer: Self-pay | Admitting: Family Medicine

## 2019-09-10 ENCOUNTER — Other Ambulatory Visit: Payer: Self-pay | Admitting: Family Medicine

## 2019-09-10 MED ORDER — CLONAZEPAM 0.5 MG PO TABS: 0.5000 mg | ORAL_TABLET | Freq: Two times a day (BID) | ORAL | 2 refills | Status: DC | PRN

## 2019-09-10 MED ORDER — FLUOXETINE HCL 20 MG PO CAPS: 20.0000 mg | ORAL_CAPSULE | Freq: Every day | ORAL | 3 refills | Status: DC

## 2019-09-10 NOTE — Telephone Encounter (Signed)
Last OV 06/06/19 Last fill Klonopin 06/06/19  #60/2 Last fill Prozac 07/06/19  #30/3

## 2019-09-10 NOTE — Telephone Encounter (Signed)
Rx renewal completed.

## 2019-09-10 NOTE — Telephone Encounter (Signed)
Patient calling office for RX refill for klonopin and prozac. Please call patient at [phone] once meds have been refilled.

## 2019-09-13 NOTE — Telephone Encounter (Signed)
Patient called office about the status of RX. Patient advised RX had been sent to pharmacy.

## 2019-09-17 ENCOUNTER — Other Ambulatory Visit: Payer: Self-pay

## 2019-09-17 ENCOUNTER — Ambulatory Visit: Payer: Medicare Other | Admitting: Family Medicine

## 2019-09-18 ENCOUNTER — Encounter: Payer: Self-pay | Admitting: Family Medicine

## 2019-09-18 ENCOUNTER — Ambulatory Visit (INDEPENDENT_AMBULATORY_CARE_PROVIDER_SITE_OTHER): Payer: Medicare Other | Admitting: Family Medicine

## 2019-09-18 VITALS — BP 140/82 | HR 101 | Temp 97.0°F | Ht 65.0 in | Wt 256.8 lb

## 2019-09-18 DIAGNOSIS — F418 Other specified anxiety disorders: Secondary | ICD-10-CM

## 2019-09-18 DIAGNOSIS — E119 Type 2 diabetes mellitus without complications: Secondary | ICD-10-CM

## 2019-09-18 DIAGNOSIS — Z1231 Encounter for screening mammogram for malignant neoplasm of breast: Secondary | ICD-10-CM

## 2019-09-18 DIAGNOSIS — R4789 Other speech disturbances: Secondary | ICD-10-CM | POA: Diagnosis not present

## 2019-09-18 DIAGNOSIS — Z135 Encounter for screening for eye and ear disorders: Secondary | ICD-10-CM

## 2019-09-18 DIAGNOSIS — I10 Essential (primary) hypertension: Secondary | ICD-10-CM | POA: Insufficient documentation

## 2019-09-18 DIAGNOSIS — R531 Weakness: Secondary | ICD-10-CM

## 2019-09-18 LAB — HEMOGLOBIN A1C: Hgb A1c MFr Bld: 7.3 % — ABNORMAL HIGH (ref 4.6–6.5)

## 2019-09-18 MED ORDER — CLONAZEPAM 0.5 MG PO TABS: 0.5000 mg | ORAL_TABLET | Freq: Two times a day (BID) | ORAL | 2 refills | Status: DC | PRN

## 2019-09-18 NOTE — Assessment & Plan Note (Signed)
Update a1c today.  Continue metformin. Recommend low carb diet.

## 2019-09-18 NOTE — Assessment & Plan Note (Signed)
Generalized deconditioning in setting of acute UTI. Improved with tx of infection and course of PT.

## 2019-09-18 NOTE — Assessment & Plan Note (Addendum)
Stable with current medications.  PDMP reviewed.  Discussed limiting clonazepam as much as possible and continue fluoxetine and buspar.

## 2019-09-18 NOTE — Patient Instructions (Addendum)
Health Maintenance Due  Topic Date Due  . Hepatitis C Screening  [birth year]  . FOOT EXAM  04/22/1956  . OPHTHALMOLOGY EXAM  04/22/1956  . MAMMOGRAM  04/22/1996    No flowsheet data found.    Diabetes Mellitus and Nutrition, Adult When you have diabetes (diabetes mellitus), it is very important to have healthy eating habits because your blood sugar (glucose) levels are greatly affected by what you eat and drink. Eating healthy foods in the appropriate amounts, at about the same times every day, can help you:  Control your blood glucose.  Lower your risk of heart disease.  Improve your blood pressure.  Reach or maintain a healthy weight. Every person with diabetes is different, and each person has different needs for a meal plan. Your health care provider may recommend that you work with a diet and nutrition specialist (dietitian) to make a meal plan that is best for you. Your meal plan may vary depending on factors such as:  The calories you need.  The medicines you take.  Your weight.  Your blood glucose, blood pressure, and cholesterol levels.  Your activity level.  Other health conditions you have, such as heart or kidney disease. How do carbohydrates affect me? Carbohydrates, also called carbs, affect your blood glucose level more than any other type of food. Eating carbs naturally raises the amount of glucose in your blood. Carb counting is a method for keeping track of how many carbs you eat. Counting carbs is important to keep your blood glucose at a healthy level, especially if you use insulin or take certain oral diabetes medicines. It is important to know how many carbs you can safely have in each meal. This is different for every person. Your dietitian can help you calculate how many carbs you should have at each meal and for each snack. Foods that contain carbs include:  Bread, cereal, rice, pasta, and crackers.  Potatoes and corn.  Peas, beans, and  lentils.  Milk and yogurt.  Fruit and juice.  Desserts, such as cakes, cookies, ice cream, and candy. How does alcohol affect me? Alcohol can cause a sudden decrease in blood glucose (hypoglycemia), especially if you use insulin or take certain oral diabetes medicines. Hypoglycemia can be a life-threatening condition. Symptoms of hypoglycemia (sleepiness, dizziness, and confusion) are similar to symptoms of having too much alcohol. If your health care provider says that alcohol is safe for you, follow these guidelines:  Limit alcohol intake to no more than 1 drink per day for nonpregnant women and 2 drinks per day for men. One drink equals 12 oz of beer, 5 oz of wine, or 1 oz of hard liquor.  Do not drink on an empty stomach.  Keep yourself hydrated with water, diet soda, or unsweetened iced tea.  Keep in mind that regular soda, juice, and other mixers may contain a lot of sugar and must be counted as carbs. What are tips for following this plan?  Reading food labels  Start by checking the serving size on the "Nutrition Facts" label of packaged foods and drinks. The amount of calories, carbs, fats, and other nutrients listed on the label is based on one serving of the item. Many items contain more than one serving per package.  Check the total grams (g) of carbs in one serving. You can calculate the number of servings of carbs in one serving by dividing the total carbs by 15. For example, if a food has 30 g of total  carbs, it would be equal to 2 servings of carbs.  Check the number of grams (g) of saturated and trans fats in one serving. Choose foods that have low or no amount of these fats.  Check the number of milligrams (mg) of salt (sodium) in one serving. Most people should limit total sodium intake to less than 2,300 mg per day.  Always check the nutrition information of foods labeled as "low-fat" or "nonfat". These foods may be higher in added sugar or refined carbs and should  be avoided.  Talk to your dietitian to identify your daily goals for nutrients listed on the label. Shopping  Avoid buying canned, premade, or processed foods. These foods tend to be high in fat, sodium, and added sugar.  Shop around the outside edge of the grocery store. This includes fresh fruits and vegetables, bulk grains, fresh meats, and fresh dairy. Cooking  Use low-heat cooking methods, such as baking, instead of high-heat cooking methods like deep frying.  Cook using healthy oils, such as olive, canola, or sunflower oil.  Avoid cooking with butter, cream, or high-fat meats. Meal planning  Eat meals and snacks regularly, preferably at the same times every day. Avoid going long periods of time without eating.  Eat foods high in fiber, such as fresh fruits, vegetables, beans, and whole grains. Talk to your dietitian about how many servings of carbs you can eat at each meal.  Eat 4-6 ounces (oz) of lean protein each day, such as lean meat, chicken, fish, eggs, or tofu. One oz of lean protein is equal to: ? 1 oz of meat, chicken, or fish. ? 1 egg. ?  cup of tofu.  Eat some foods each day that contain healthy fats, such as avocado, nuts, seeds, and fish. Lifestyle  Check your blood glucose regularly.  Exercise regularly as told by your health care provider. This may include: ? 150 minutes of moderate-intensity or vigorous-intensity exercise each week. This could be brisk walking, biking, or water aerobics. ? Stretching and doing strength exercises, such as yoga or weightlifting, at least 2 times a week.  Take medicines as told by your health care provider.  Do not use any products that contain nicotine or tobacco, such as cigarettes and e-cigarettes. If you need help quitting, ask your health care provider.  Work with a Veterinary surgeon or diabetes educator to identify strategies to manage stress and any emotional and social challenges. Questions to ask a health care  provider  Do I need to meet with a diabetes educator?  Do I need to meet with a dietitian?  What number can I call if I have questions?  When are the best times to check my blood glucose? Where to find more information:  American Diabetes Association: diabetes.org  Academy of Nutrition and Dietetics: www.eatright.[employer] of Diabetes and Digestive and Kidney Diseases (NIH): CarFlippers.tn Summary  A healthy meal plan will help you control your blood glucose and maintain a healthy lifestyle.  Working with a diet and nutrition specialist (dietitian) can help you make a meal plan that is best for you.  Keep in mind that carbohydrates (carbs) and alcohol have immediate effects on your blood glucose levels. It is important to count carbs and to use alcohol carefully. This information is not intended to replace advice given to you by your health care provider. Make sure you discuss any questions you have with your health care provider. Document Revised: 07/22/2017 Document Reviewed: 09/13/2016 Elsevier Patient Education  [phone]  [employer].

## 2019-09-18 NOTE — Progress Notes (Signed)
Rebecca Barnes - 74 y.o. female MRN [id number]  Date of birth: 20-Feb-1946  Subjective Chief Complaint  Patient presents with  . Diabetes    follow up.  Pt hhad a recent ER visit for rt foot, 2nd toe issuse.  Ptr is due for a foot exam.    HPI Rebecca Barnes is a 74 y.o. female with history of HTN, T2DM, and anxiety here today for routine follow up.  Since her last visit with me she was seen in ED due to splitting part of her toenail off.  This has healed well.   -HTN:  Current mgmt with amlodipine and losartan.  She is doing well with this and denies side effects including symptoms of hypotension.  She has not had headache, vision change, chest pain, increased edema or shortness of breath.   -T2DM:  She does not check glucose at home regularly.  Reports that she feels "good".  She is taking metformin as directed.  She denies increased thirst or urination. She is taking crestor 5mg  for cholesterol as well.   -Anxiety:  She report that anxiety is pretty well controlled at this time.  She is currently taking fluoxetine and buspar with clonazepam as needed.  #60 clonazepam is lasting her about 2 months at this time.  She denies side effects with medication.  She was having some problems with word finding but this seemed to be related to encephalopathy from  UTI. This has improved.  MRI of the brain in 06/2020 without acute findings.   -Gait abnormality:  Has completed course of PT with improvement in her gait.  She feels much more steady on her feet now.   ROS:  A comprehensive ROS was completed and negative except as noted per HPI  Allergies  Allergen Reactions  . Sitagliptin     Other reaction(s): DIZZINESS. (JANUVIA)  . Sulfa Antibiotics     (SULFA)    Past Medical History:  Diagnosis Date  . Anxiety   . Asthma   . Diabetes mellitus without complication ([place])   . Hypertension   . Stroke [facility])     Past Surgical History:  Procedure Laterality Date  . arm fracture     arm  surgery  . CHOLECYSTECTOMY      Social History   Socioeconomic History  . Marital status: Married    Spouse name: Not on file  . Number of children: Not on file  . Years of education: Not on file  . Highest education level: Not on file  Occupational History  . Not on file  Tobacco Use  . Smoking status: Never Smoker  . Smokeless tobacco: Never Used  Substance and Sexual Activity  . Alcohol use: Never  . Drug use: Yes    Types: Marijuana    Comment: ocassionally  . Sexual activity: Not Currently  Other Topics Concern  . Not on file  Social History Narrative  . Not on file   Social Determinants of Health   Financial Resource Strain:   . Difficulty of Paying Living Expenses: Not on file  Food Insecurity:   . Worried About Charity fundraiser in the Last Year: Not on file  . Ran Out of Food in the Last Year: Not on file  Transportation Needs:   . Lack of Transportation (Medical): Not on file  . Lack of Transportation (Non-Medical): Not on file  Physical Activity:   . Days of Exercise per Week: Not on file  . Minutes of Exercise per  Session: Not on file  Stress:   . Feeling of Stress : Not on file  Social Connections:   . Frequency of Communication with Friends and Family: Not on file  . Frequency of Social Gatherings with Friends and Family: Not on file  . Attends Religious Services: Not on file  . Active Member of Clubs or Organizations: Not on file  . Attends Banker Meetings: Not on file  . Marital Status: Not on file    Family History  Problem Relation Age of Onset  . Cancer Mother     Health Maintenance  Topic Date Due  . Hepatitis C Screening  [birth year]  . FOOT EXAM  04/22/1956  . OPHTHALMOLOGY EXAM  04/22/1956  . MAMMOGRAM  04/22/1996  . HEMOGLOBIN A1C  12/05/2019  . Fecal DNA (Cologuard)  07/11/2020  . TETANUS/TDAP  09/17/2026  . INFLUENZA VACCINE  Completed  . DEXA SCAN  Completed  . PNA vac Low Risk Adult  Completed     ----------------------------------------------------------------------------------------------------------------------------------------------------------------------------------------------------------------- Physical Exam BP 140/82 (BP Location: Left Arm, Patient Position: Sitting, Cuff Size: Large)   Pulse (!) 101   Temp (!) 97 F (36.1 C) (Temporal)   Ht 5\' 5"  (1.651 m)   Wt 256 lb 12.8 oz (116.5 kg)   SpO2 98%   BMI 42.73 kg/m   Physical Exam Constitutional:      Appearance: Normal appearance.  HENT:     Head: Normocephalic and atraumatic.     Mouth/Throat:     Mouth: Mucous membranes are moist.  Eyes:     General: No scleral icterus. Cardiovascular:     Rate and Rhythm: Normal rate and regular rhythm.  Pulmonary:     Effort: Pulmonary effort is normal.     Breath sounds: Normal breath sounds.  Musculoskeletal:     Cervical back: Neck supple.  Skin:    General: Skin is warm and dry.  Neurological:     Mental Status: She is alert.  Psychiatric:        Mood and Affect: Mood normal.        Behavior: Behavior normal.    Diabetic Foot Exam - Simple   Simple Foot Form Diabetic Foot exam was performed with the following findings: Yes 09/18/2019  2:37 PM  Visual Inspection No deformities, no ulcerations, no other skin breakdown bilaterally: Yes Sensation Testing Intact to touch and monofilament testing bilaterally: Yes Pulse Check Posterior Tibialis and Dorsalis pulse intact bilaterally: Yes Comments      ------------------------------------------------------------------------------------------------------------------------------------------------------------------------------------------------------------------- Assessment and Plan  Word finding difficulty Resolved  Anxiety with depression Stable with current medications.  PDMP reviewed.  Discussed limiting clonazepam as much as possible and continue fluoxetine and buspar.    Generalized  weakness Generalized deconditioning in setting of acute UTI. Improved with tx of infection and course of PT.   Type 2 diabetes mellitus without complication, without long-term current use of insulin (HCC) Update a1c today.  Continue metformin. Recommend low carb diet.   Essential hypertension BP is well controlled, continue current medications.    This visit occurred during the SARS-CoV-2 public health emergency.  Safety protocols were in place, including screening questions prior to the visit, additional usage of staff PPE, and extensive cleaning of exam room while observing appropriate contact time as indicated for disinfecting solutions.

## 2019-09-18 NOTE — Assessment & Plan Note (Signed)
-  BP is well controlled, continue current medications.  

## 2019-09-18 NOTE — Assessment & Plan Note (Signed)
Resolved

## 2019-10-19 ENCOUNTER — Telehealth: Payer: Self-pay | Admitting: Family Medicine

## 2019-10-19 MED ORDER — BUSPIRONE HCL 5 MG PO TABS: 5.0000 mg | ORAL_TABLET | Freq: Two times a day (BID) | ORAL | 0 refills | Status: DC

## 2019-10-19 MED ORDER — AMLODIPINE BESYLATE 5 MG PO TABS: 5.0000 mg | ORAL_TABLET | Freq: Every day | ORAL | 0 refills | Status: DC

## 2019-10-19 NOTE — Telephone Encounter (Signed)
Pt has toc visit set with Dr Barron Alvine but needs a refill on Amlodipine and Buspirone she is out

## 2019-10-19 NOTE — Telephone Encounter (Signed)
Last OV 09/18/2019 Last fill for Buspirone 10/12/18, historical provider Amlodipine historical provider

## 2019-10-19 NOTE — Telephone Encounter (Signed)
Both Rx refilled 

## 2019-10-25 ENCOUNTER — Ambulatory Visit (INDEPENDENT_AMBULATORY_CARE_PROVIDER_SITE_OTHER): Payer: Medicare Other | Admitting: Family Medicine

## 2019-10-25 ENCOUNTER — Encounter: Payer: Self-pay | Admitting: Family Medicine

## 2019-10-25 VITALS — Ht 65.0 in | Wt 256.0 lb

## 2019-10-25 DIAGNOSIS — F418 Other specified anxiety disorders: Secondary | ICD-10-CM

## 2019-10-25 MED ORDER — FLUOXETINE HCL 20 MG PO CAPS: 20.0000 mg | ORAL_CAPSULE | Freq: Every day | ORAL | 3 refills | Status: DC

## 2019-10-25 NOTE — Progress Notes (Signed)
Virtual Visit via Video Note  I connected with Rebecca Barnes on 10/25/19 at 10:30 AM EST by a video enabled telemedicine application and verified that I am speaking with the correct person using two identifiers. Location patient: home Location provider:  home office Persons participating in the virtual visit: patient, provider  I discussed the limitations of evaluation and management by telemedicine and the availability of in person appointments. The patient expressed understanding and agreed to proceed.  Chief Complaint  Patient presents with  . Establish Care    TOC/medication refill     HPI: Rebecca Barnes is a 74 y.o. female who is a former pt of Dr. Zigmund Daniel. Today she is following up on her anxiety which is not a new issue for the patient. She is on prozac 20mg  daily as well as buspar 5mg  BID. She has been on prozac 20mg  daily x years, doing well. No issues/concerns. Denies side effects from medications.     Past Medical History:  Diagnosis Date  . Anxiety   . Asthma   . Diabetes mellitus without complication ([place])   . Hypertension   . Stroke [facility])     Past Surgical History:  Procedure Laterality Date  . arm fracture     arm surgery  . CHOLECYSTECTOMY      Family History  Problem Relation Age of Onset  . Cancer Mother     Social History   Tobacco Use  . Smoking status: Never Smoker  . Smokeless tobacco: Never Used  Substance Use Topics  . Alcohol use: Never  . Drug use: Yes    Types: Marijuana    Comment: ocassionally     Current Outpatient Medications:  .  amLODipine (NORVASC) 5 MG tablet, Take 1 tablet (5 mg total) by mouth daily., Disp: 90 tablet, Rfl: 0 .  busPIRone (BUSPAR) 5 MG tablet, Take 1 tablet (5 mg total) by mouth 2 (two) times daily., Disp: 180 tablet, Rfl: 0 .  clonazePAM (KLONOPIN) 0.5 MG tablet, Take 1 tablet (0.5 mg total) by mouth 2 (two) times daily as needed for anxiety., Disp: 60 tablet, Rfl: 2 .  FLUoxetine (PROZAC) 20 MG  capsule, Take 1 capsule (20 mg total) by mouth daily., Disp: 30 capsule, Rfl: 3 .  losartan (COZAAR) 100 MG tablet, TAKE 1 TABLET BY MOUTH EVERY DAY, Disp: , Rfl:  .  metFORMIN (GLUCOPHAGE) 1000 MG tablet, Take 1 tablet (1,000 mg total) by mouth 2 (two) times daily with a meal., Disp: 180 tablet, Rfl: 2 .  Multiple Vitamin (MULTI-VITAMIN) tablet, Take by mouth., Disp: , Rfl:  .  rosuvastatin (CRESTOR) 5 MG tablet, Take by mouth., Disp: , Rfl:  .  fluticasone furoate-vilanterol (BREO ELLIPTA) 100-25 MCG/INH AEPB, Inhale into the lungs., Disp: , Rfl:   Allergies  Allergen Reactions  . Sitagliptin     Other reaction(s): DIZZINESS. (JANUVIA)  . Sulfa Antibiotics     (SULFA)      ROS: See pertinent positives and negatives per HPI.   EXAM:  VITALS per patient if applicable: Ht 5\' 5"  (1.651 m)   Wt 256 lb (116.1 kg)   BMI 42.60 kg/m    GENERAL: alert, oriented, appears well and in no acute distress  HEENT: atraumatic, conjunctiva clear, no obvious abnormalities on inspection of external nose and ears  NECK: normal movements of the head and neck  LUNGS: on inspection no signs of respiratory distress, breathing rate appears normal, no obvious gross SOB, gasping or wheezing, no conversational dyspnea  CV:  no obvious cyanosis  PSYCH/NEURO: pleasant and cooperative, speech and thought processing grossly intact   ASSESSMENT AND PLAN: 1. Anxiety with depression - chronic issue, stable Refill: - FLUoxetine (PROZAC) 20 MG capsule; Take 1 capsule (20 mg total) by mouth daily.  Dispense: 90 capsule; Refill: 3 - cont with buspar 5mg  BID  - f/u PRN Discussed plan and reviewed medications with patient, including risks, benefits, and potential side effects. Pt expressed understand. All questions answered.  I discussed the assessment and treatment plan with the patient. The patient was provided an opportunity to ask questions and all were answered. The patient agreed with the plan and  demonstrated an understanding of the instructions.   The patient was advised to call back or seek an in-person evaluation if the symptoms worsen or if the condition fails to improve as anticipated.   , DO

## 2019-11-06 ENCOUNTER — Ambulatory Visit: Payer: Medicare Other

## 2019-11-29 ENCOUNTER — Ambulatory Visit
Admission: RE | Admit: 2019-11-29 | Discharge: 2019-11-29 | Disposition: A | Discharge status: Home / Self Care | Payer: Medicare Other | Source: Ambulatory Visit | Attending: Family Medicine | Admitting: Family Medicine

## 2019-11-29 ENCOUNTER — Other Ambulatory Visit: Payer: Self-pay

## 2019-11-29 DIAGNOSIS — Z1231 Encounter for screening mammogram for malignant neoplasm of breast: Secondary | ICD-10-CM

## 2019-12-31 ENCOUNTER — Telehealth: Payer: Self-pay | Admitting: Family Medicine

## 2019-12-31 NOTE — Telephone Encounter (Signed)
Caller Name: Anarely Phone: [phone]  clonazePAM (KLONOPIN) 0.5 MG tablet - pt is out - pt takes 1-2/day prn FLUoxetine (PROZAC) 20 MG capsule  - pt is out - pt taking 2/day. RX from Dr. Ashley Royalty was 2/day, most recent RX from Dr. Salena Saner is 1/day.   Pt was advised she may have to wait until Dr. Salena Saner is in the office Wednesday. Please advise pt.  [id number] [place], [place] - [address] AT [facility] [address] Phone:  [phone]  Fax:  [phone]

## 2020-01-01 ENCOUNTER — Other Ambulatory Visit: Payer: Self-pay

## 2020-01-01 DIAGNOSIS — F418 Other specified anxiety disorders: Secondary | ICD-10-CM

## 2020-01-01 NOTE — Telephone Encounter (Signed)
Last fill Clonazepam 09/18/19  #60/2 Last OV 10/25/19 I informed pt that there should be 3 refills left for the Fluoxentine and if not then to call office back.

## 2020-01-01 NOTE — Telephone Encounter (Signed)
I informed pt that there should be 3 refills left for the Fluoxentine and if not then to call office back.  Pt aware.

## 2020-01-02 MED ORDER — CLONAZEPAM 0.5 MG PO TABS: 0.5000 mg | ORAL_TABLET | Freq: Two times a day (BID) | ORAL | 2 refills | Status: DC | PRN

## 2020-01-17 ENCOUNTER — Other Ambulatory Visit: Payer: Self-pay | Admitting: Family Medicine

## 2020-01-18 ENCOUNTER — Other Ambulatory Visit: Payer: Self-pay | Admitting: Family Medicine

## 2020-01-28 ENCOUNTER — Other Ambulatory Visit: Payer: Self-pay | Admitting: Family Medicine

## 2020-01-28 NOTE — Telephone Encounter (Signed)
Last OV 10/25/19 Last fill 10/19/19  #90/0

## 2020-02-12 ENCOUNTER — Other Ambulatory Visit: Payer: Self-pay

## 2020-02-12 NOTE — Progress Notes (Signed)
Subjective:   Rebecca Barnes is a 74 y.o. female who presents for Medicare Annual (Subsequent) preventive examination.  Review of Systems:   Cardiac Risk Factors include: advanced age (>48men, >69 women);diabetes mellitus;hypertension;obesity (BMI >30kg/m2);sedentary lifestyle     Objective:     Vitals: BP 136/78 (BP Location: Right Arm, Patient Position: Sitting, Cuff Size: Large)   Pulse 100   Temp (!) 97.5 F (36.4 C) (Temporal)   Resp 16   Ht 5\' 5"  (1.651 m)   Wt 269 lb 11.2 oz (122.3 kg)   SpO2 95%   BMI 44.88 kg/m   Body mass index is 44.88 kg/m.  Advanced Directives 02/13/2020 06/01/2019  Does Patient Have a Medical Advance Directive? No No  Would patient like information on creating a medical advance directive? No - Patient declined -    Tobacco Social History   Tobacco Use  Smoking Status Never Smoker  Smokeless Tobacco Never Used     Counseling given: Not Answered   Clinical Intake:  Pre-visit preparation completed: Yes  Pain : No/denies pain     Nutritional Status: BMI > 30  Obese Nutritional Risks: Nausea/ vomitting/ diarrhea (Diarrhea x several days) Diabetes: Yes CBG done?: No Did pt. bring in CBG monitor from home?: No  How often do you need to have someone help you when you read instructions, pamphlets, or other written materials from your doctor or pharmacy?: 1 - Never  Nutrition Risk Assessment:  Has the patient had any N/V/D within the last 2 months?  Yes-diarrhea x 2 days Does the patient have any non-healing wounds?  No  Has the patient had any unintentional weight loss or weight gain?  No   Diabetes:  Is the patient diabetic?  Yes  If diabetic, was a CBG obtained today?  No  Did the patient bring in their glucometer from home?  No  How often do you monitor your CBG's? occasionally.   Financial Strains and Diabetes Management:  Are you having any financial strains with the device, your supplies or your medication? Yes .  Cannot afford inhaler-referred to CCM-pharmacist Does the patient want to be seen by Chronic Care Management for management of their diabetes?  No  Would the patient like to be referred to a Nutritionist or for Diabetic Management?  No   Diabetic Exams:  Diabetic Eye Exam:Overdue for diabetic eye exam. Pt has been advised about the importance in completing this exam. Patient states she has an appt in July.  Diabetic Foot Exam: Completed 09/18/2019..   Interpreter Needed?: No  Information entered by :: Caroleen Hamman LPN  Past Medical History:  Diagnosis Date  . Anxiety   . Asthma   . Diabetes mellitus without complication ([place])   . Hypertension   . Stroke [facility])    Past Surgical History:  Procedure Laterality Date  . arm fracture     arm surgery  . CHOLECYSTECTOMY     Family History  Problem Relation Age of Onset  . Cancer Mother    Social History   Socioeconomic History  . Marital status: Widowed    Spouse name: Not on file  . Number of children: Not on file  . Years of education: Not on file  . Highest education level: Not on file  Occupational History  . Occupation: Retired  Tobacco Use  . Smoking status: Never Smoker  . Smokeless tobacco: Never Used  Substance and Sexual Activity  . Alcohol use: Never  . Drug use: Yes  Types: Marijuana    Comment: ocassionally  . Sexual activity: Not Currently  Other Topics Concern  . Not on file  Social History Narrative  . Not on file   Social Determinants of Health   Financial Resource Strain: Low Risk   . Difficulty of Paying Living Expenses: Not hard at all  Food Insecurity: No Food Insecurity  . Worried About Programme researcher, broadcasting/film/video in the Last Year: Never true  . Ran Out of Food in the Last Year: Never true  Transportation Needs: No Transportation Needs  . Lack of Transportation (Medical): No  . Lack of Transportation (Non-Medical): No  Physical Activity: Inactive  . Days of Exercise per Week: 0 days  .  Minutes of Exercise per Session: 0 min  Stress: No Stress Concern Present  . Feeling of Stress : Not at all  Social Connections: Socially Isolated  . Frequency of Communication with Friends and Family: More than three times a week  . Frequency of Social Gatherings with Friends and Family: Once a week  . Attends Religious Services: Never  . Active Member of Clubs or Organizations: No  . Attends Banker Meetings: Never  . Marital Status: Widowed    Outpatient Encounter Medications as of 02/13/2020  Medication Sig  . amLODipine (NORVASC) 5 MG tablet TAKE 1 TABLET(5 MG) BY MOUTH DAILY  . busPIRone (BUSPAR) 5 MG tablet Take 1 tablet (5 mg total) by mouth 2 (two) times daily.  . clonazePAM (KLONOPIN) 0.5 MG tablet Take 1 tablet (0.5 mg total) by mouth 2 (two) times daily as needed for anxiety.  Marland Kitchen FLUoxetine (PROZAC) 20 MG capsule Take 1 capsule (20 mg total) by mouth daily.  Marland Kitchen losartan (COZAAR) 100 MG tablet TAKE 1 TABLET BY MOUTH EVERY DAY  . metFORMIN (GLUCOPHAGE) 1000 MG tablet Take 1 tablet (1,000 mg total) by mouth 2 (two) times daily with a meal.  . Multiple Vitamin (MULTI-VITAMIN) tablet Take by mouth.  . rosuvastatin (CRESTOR) 5 MG tablet Take by mouth.  . fluticasone furoate-vilanterol (BREO ELLIPTA) 100-25 MCG/INH AEPB Inhale into the lungs. (Patient not taking: Reported on 02/13/2020)  . [DISCONTINUED] amLODipine (NORVASC) 5 MG tablet TAKE 1 TABLET(5 MG) BY MOUTH DAILY   No facility-administered encounter medications on file as of 02/13/2020.    Activities of Daily Living In your present state of health, do you have any difficulty performing the following activities: 02/13/2020  Hearing? Y  Comment hearing aids  Vision? N  Difficulty concentrating or making decisions? Y  Comment short term memory loss  Walking or climbing stairs? Y  Comment stairs  Dressing or bathing? N  Doing errands, shopping? N  Preparing Food and eating ? N  Using the Toilet? N  In the past  six months, have you accidently leaked urine? N  Do you have problems with loss of bowel control? N  Managing your Medications? N  Managing your Finances? N  Housekeeping or managing your Housekeeping? N  Some recent data might be hidden    Patient Care Team: [name], DO as PCP - General (Family Medicine)    Assessment:   This is a routine wellness examination for Myda.  Exercise Activities and Dietary recommendations Current Exercise Habits: The patient does not participate in regular exercise at present, Exercise limited by: None identified  Goals Addressed            This Visit's Progress   . Patient Stated       More exercise.  Joined the gym       Fall Risk: Fall Risk  02/13/2020 06/06/2019  Falls in the past year? 0 1  Number falls in past yr: 0 1  Injury with Fall? 0 1  Risk for fall due to : History of fall(s) -  Follow up Falls prevention discussed -    FALL RISK PREVENTION PERTAINING TO THE HOME:  Any stairs in or around the home? No    Home free of loose throw rugs in walkways, pet beds, electrical cords, etc? Yes  Adequate lighting in your home to reduce risk of falls? Yes   ASSISTIVE DEVICES UTILIZED TO PREVENT FALLS:  Life alert? No  Use of a cane, walker or w/c? No  Grab bars in the bathroom? No  Shower chair or bench in shower? No  Elevated toilet seat or a handicapped toilet? No   TIMED UP AND GO:  Was the test performed? Yes .  Length of time to ambulate 10 feet: 12 sec.   GAIT:  Appearance of gait:  Gait slow and steady without the use of an assistive device.  Education: Fall risk prevention has been discussed.  Intervention(s) required? No   DME/home health order needed?  No    Depression Screen PHQ 2/9 Scores 02/13/2020 09/18/2019  PHQ - 2 Score 0 0  PHQ- 9 Score - 3     Cognitive Function: Patient states she has some mild issues with short term memory but nothing severe. She still drives & lives alone.       6CIT Screen 02/13/2020  What Year? 0 points  What month? 0 points  What time? 0 points  Count back from 20 0 points  Months in reverse 0 points  Repeat phrase 0 points  Total Score 0    Immunization History  Administered Date(s) Administered  . Fluad Quad(high Dose 65+) 06/06/2019  . Influenza-Unspecified 04/16/2017  . Pneumococcal Conjugate-13 09/17/2016  . Pneumococcal Polysaccharide-23 02/11/2009, 07/06/2018  . Tdap 09/17/2016    Qualifies for Shingles Vaccine? Yes  Zostavax completed never. Due for Shingrix. Education has been provided regarding the importance of this vaccine. Pt has been advised to call insurance company to determine out of pocket expense. Advised may also receive vaccine at local pharmacy or Health Dept. Verbalized acceptance and understanding.  Tdap: Up to date   Flu Vaccine: Due 04/2020  Pneumococcal Vaccine: Completed  Covid-19 Vaccine:  Completed vaccines; unsure of date. Patient to bring card to next office visit  Screening Tests Health Maintenance  Topic Date Due  . Hepatitis C Screening  Never done  . OPHTHALMOLOGY EXAM  Never done  . COVID-19 Vaccine (1) Never done  . MAMMOGRAM  02/12/2021 (Originally 04/22/1996)  . HEMOGLOBIN A1C  03/17/2020  . INFLUENZA VACCINE  03/23/2020  . Fecal DNA (Cologuard)  07/11/2020  . FOOT EXAM  09/17/2020  . TETANUS/TDAP  09/17/2026  . DEXA SCAN  Completed  . PNA vac Low Risk Adult  Completed    Cancer Screenings:  Colorectal Screening: Completed Cologuard 07/11/2017. Repeat every 3 years.  Mammogram: Completed unsure of date. Repeat every year. Patient declined  Bone Density: Completed 05/02/2018.  Repeat every 2 years.   Lung Cancer Screening: (Low Dose CT Chest recommended if Age 4-80 years, 30 pack-year currently smoking OR have quit w/in 15years.) does not qualify.    Additional Screening:  Hepatitis C Screening: does qualify. Discuss with PCP  Dental Screening: Recommended annual dental  exams for proper oral hygiene  Community Resource Referral:  CRR required this visit?  No       Plan:  I have personally reviewed and addressed the Medicare Annual Wellness questionnaire and have noted the following in the patient's chart:  A. Medical and social history B. Use of alcohol, tobacco or illicit drugs  C. Current medications and supplements D. Functional ability and status E.  Nutritional status F.  Physical activity G. Advance directives H. List of other physicians I.  Hospitalizations, surgeries, and ER visits in previous 12 months J.  Vitals K. Screenings such as hearing and vision if needed, cognitive and depression L. Referrals and appointments   In addition, I have reviewed and discussed with patient certain preventive protocols, quality metrics, and best practice recommendations. A written personalized care plan for preventive services as well as general preventive health recommendations were provided to patient.   Signed,    Roanna Raider, LPN  10/16/95 Nurse Health Advisor   Nurse Notes: Referral placed to CCM-pharmacist to discuss options for assistance with cost of inhaler.

## 2020-02-13 ENCOUNTER — Ambulatory Visit (INDEPENDENT_AMBULATORY_CARE_PROVIDER_SITE_OTHER): Payer: Medicare Other

## 2020-02-13 ENCOUNTER — Ambulatory Visit: Payer: Medicare Other | Admitting: *Deleted

## 2020-02-13 VITALS — BP 136/78 | HR 100 | Temp 97.5°F | Resp 16 | Ht 65.0 in | Wt 269.7 lb

## 2020-02-13 DIAGNOSIS — Z0000 Encounter for general adult medical examination without abnormal findings: Secondary | ICD-10-CM | POA: Diagnosis not present

## 2020-02-13 NOTE — Patient Instructions (Signed)
Rebecca Barnes , Thank you for taking time to come for your Medicare Wellness Visit. I appreciate your ongoing commitment to your health goals. Please review the following plan we discussed and let me know if I can assist you in the future.   Screening recommendations/referrals: Colonoscopy: Cologuard completed-07/11/2017- Due 07/11/2020 Mammogram: Declined. Call the office if you change your mind. Bone Density: Completed 05/02/2018-Due 05/02/2020 Recommended yearly ophthalmology/optometry visit for glaucoma screening and checkup Recommended yearly dental visit for hygiene and checkup  Vaccinations: Influenza vaccine: Up to Date- Due 04/2020 Pneumococcal vaccine: Completed vaccines Tdap vaccine: Up to date-09/17/2016- Next due 09/17/2026 Shingles vaccine: Discuss with pharmacy   Covid-19:Completed vaccines. Bring card to next office visit.  Advanced directives: Discussed. Declined at this time.  Conditions/risks identified: See problem list Will place referral to discuss options for help with cost of inhaler.  Next appointment: Follow up in one year for your annual wellness visit    Preventive Care 65 Years and Older, Female Preventive care refers to lifestyle choices and visits with your health care provider that can promote health and wellness. What does preventive care include?  A yearly physical exam. This is also called an annual well check.  Dental exams once or twice a year.  Routine eye exams. Ask your health care provider how often you should have your eyes checked.  Personal lifestyle choices, including:  Daily care of your teeth and gums.  Regular physical activity.  Eating a healthy diet.  Avoiding tobacco and drug use.  Limiting alcohol use.  Practicing safe sex.  Taking low-dose aspirin every day.  Taking vitamin and mineral supplements as recommended by your health care provider. What happens during an annual well check? The services and screenings done by  your health care provider during your annual well check will depend on your age, overall health, lifestyle risk factors, and family history of disease. Counseling  Your health care provider may ask you questions about your:  Alcohol use.  Tobacco use.  Drug use.  Emotional well-being.  Home and relationship well-being.  Sexual activity.  Eating habits.  History of falls.  Memory and ability to understand (cognition).  Work and work Statistician.  Reproductive health. Screening  You may have the following tests or measurements:  Height, weight, and BMI.  Blood pressure.  Lipid and cholesterol levels. These may be checked every 5 years, or more frequently if you are over 47 years old.  Skin check.  Lung cancer screening. You may have this screening every year starting at age 17 if you have a 30-pack-year history of smoking and currently smoke or have quit within the past 15 years.  Fecal occult blood test (FOBT) of the stool. You may have this test every year starting at age 85.  Flexible sigmoidoscopy or colonoscopy. You may have a sigmoidoscopy every 5 years or a colonoscopy every 10 years starting at age 60.  Hepatitis C blood test.  Hepatitis B blood test.  Sexually transmitted disease (STD) testing.  Diabetes screening. This is done by checking your blood sugar (glucose) after you have not eaten for a while (fasting). You may have this done every 1-3 years.  Bone density scan. This is done to screen for osteoporosis. You may have this done starting at age 30.  Mammogram. This may be done every 1-2 years. Talk to your health care provider about how often you should have regular mammograms. Talk with your health care provider about your test results, treatment options, and if  necessary, the need for more tests. Vaccines  Your health care provider may recommend certain vaccines, such as:  Influenza vaccine. This is recommended every year.  Tetanus,  diphtheria, and acellular pertussis (Tdap, Td) vaccine. You may need a Td booster every 10 years.  Zoster vaccine. You may need this after age 90.  Pneumococcal 13-valent conjugate (PCV13) vaccine. One dose is recommended after age 66.  Pneumococcal polysaccharide (PPSV23) vaccine. One dose is recommended after age 96. Talk to your health care provider about which screenings and vaccines you need and how often you need them. This information is not intended to replace advice given to you by your health care provider. Make sure you discuss any questions you have with your health care provider. Document Released: 09/05/2015 Document Revised: 04/28/2016 Document Reviewed: 06/10/2015 Elsevier Interactive Patient Education  2017 [employer].  Fall Prevention in the Home Falls can cause injuries. They can happen to people of all ages. There are many things you can do to make your home safe and to help prevent falls. What can I do on the outside of my home?  Regularly fix the edges of walkways and driveways and fix any cracks.  Remove anything that might make you trip as you walk through a door, such as a raised step or threshold.  Trim any bushes or trees on the path to your home.  Use bright outdoor lighting.  Clear any walking paths of anything that might make someone trip, such as rocks or tools.  Regularly check to see if handrails are loose or broken. Make sure that both sides of any steps have handrails.  Any raised decks and porches should have guardrails on the edges.  Have any leaves, snow, or ice cleared regularly.  Use sand or salt on walking paths during winter.  Clean up any spills in your garage right away. This includes oil or grease spills. What can I do in the bathroom?  Use night lights.  Install grab bars by the toilet and in the tub and shower. Do not use towel bars as grab bars.  Use non-skid mats or decals in the tub or shower.  If you need to sit down in  the shower, use a plastic, non-slip stool.  Keep the floor dry. Clean up any water that spills on the floor as soon as it happens.  Remove soap buildup in the tub or shower regularly.  Attach bath mats securely with double-sided non-slip rug tape.  Do not have throw rugs and other things on the floor that can make you trip. What can I do in the bedroom?  Use night lights.  Make sure that you have a light by your bed that is easy to reach.  Do not use any sheets or blankets that are too big for your bed. They should not hang down onto the floor.  Have a firm chair that has side arms. You can use this for support while you get dressed.  Do not have throw rugs and other things on the floor that can make you trip. What can I do in the kitchen?  Clean up any spills right away.  Avoid walking on wet floors.  Keep items that you use a lot in easy-to-reach places.  If you need to reach something above you, use a strong step stool that has a grab bar.  Keep electrical cords out of the way.  Do not use floor polish or wax that makes floors slippery. If you must  use wax, use non-skid floor wax.  Do not have throw rugs and other things on the floor that can make you trip. What can I do with my stairs?  Do not leave any items on the stairs.  Make sure that there are handrails on both sides of the stairs and use them. Fix handrails that are broken or loose. Make sure that handrails are as long as the stairways.  Check any carpeting to make sure that it is firmly attached to the stairs. Fix any carpet that is loose or worn.  Avoid having throw rugs at the top or bottom of the stairs. If you do have throw rugs, attach them to the floor with carpet tape.  Make sure that you have a light switch at the top of the stairs and the bottom of the stairs. If you do not have them, ask someone to add them for you. What else can I do to help prevent falls?  Wear shoes that:  Do not have high  heels.  Have rubber bottoms.  Are comfortable and fit you well.  Are closed at the toe. Do not wear sandals.  If you use a stepladder:  Make sure that it is fully opened. Do not climb a closed stepladder.  Make sure that both sides of the stepladder are locked into place.  Ask someone to hold it for you, if possible.  Clearly mark and make sure that you can see:  Any grab bars or handrails.  First and last steps.  Where the edge of each step is.  Use tools that help you move around (mobility aids) if they are needed. These include:  Canes.  Walkers.  Scooters.  Crutches.  Turn on the lights when you go into a dark area. Replace any light bulbs as soon as they burn out.  Set up your furniture so you have a clear path. Avoid moving your furniture around.  If any of your floors are uneven, fix them.  If there are any pets around you, be aware of where they are.  Review your medicines with your doctor. Some medicines can make you feel dizzy. This can increase your chance of falling. Ask your doctor what other things that you can do to help prevent falls. This information is not intended to replace advice given to you by your health care provider. Make sure you discuss any questions you have with your health care provider. Document Released: 06/05/2009 Document Revised: 01/15/2016 Document Reviewed: 09/13/2014 Elsevier Interactive Patient Education  2017 [employer].

## 2020-02-14 ENCOUNTER — Telehealth: Payer: Self-pay | Admitting: Family Medicine

## 2020-02-14 NOTE — Progress Notes (Signed)
  Chronic Care Management   Outreach Note  02/14/2020 Name: Rebecca Barnes MRN: [id number] DOB: 09/02/1945  Referred by: [name], DO Reason for referral : No chief complaint on file.   An unsuccessful telephone outreach was attempted today. The patient was referred to the pharmacist for assistance with care management and care coordination. This note is not being shared with the patient for the following reason: To respect privacy (The patient or proxy has requested that the information not be shared).  Follow Up Plan:   Lynnae January Upstream Scheduler

## 2020-02-29 ENCOUNTER — Other Ambulatory Visit: Payer: Self-pay | Admitting: Family Medicine

## 2020-03-24 ENCOUNTER — Telehealth: Payer: Self-pay

## 2020-03-24 NOTE — Telephone Encounter (Signed)
03/24/20 Left message for patient to return my call regarding information for Medicare Extrahelp. Olean Ree [phone]

## 2020-04-02 NOTE — Telephone Encounter (Signed)
04/02/20 Spoke with patient to fill out patient portion on [employer]  PAP application for Breo. Emailed form to [employer] to have physician complete and sign the prescriber portion.  Patient stated she could come in and sign the application  when ready.  Rebecca Barnes [phone]

## 2020-04-03 ENCOUNTER — Telehealth: Payer: Self-pay | Admitting: Family Medicine

## 2020-04-03 NOTE — Progress Notes (Signed)
  Chronic Care Management   Outreach Note  04/03/2020 Name: Rebecca Barnes MRN: [id number] DOB: 1946/05/01  Referred by: [name], DO Reason for referral : No chief complaint on file.   An unsuccessful telephone outreach was attempted today. The patient was referred to the pharmacist for assistance with care management and care coordination.   Follow Up Plan:   Lynnae January Upstream Scheduler

## 2020-04-07 NOTE — Telephone Encounter (Signed)
04/07/20 Spoke with patient to let her know she can come to the office to sign the [facility] patient assistance program application per [employer].  Also let her know that she would need a copy of her Medicare card and prescription receipts. No other resources needed at this time. Olean Ree [phone]

## 2020-04-09 MED ORDER — FLUTICASONE FUROATE-VILANTEROL 100-25 MCG/INH IN AEPB: 1.0000 | INHALATION_SPRAY | Freq: Every day | RESPIRATORY_TRACT | 5 refills | Status: DC

## 2020-04-09 NOTE — Telephone Encounter (Signed)
Rx printed and signed. Will have this placed at front desk for pt to pick up

## 2020-04-09 NOTE — Addendum Note (Signed)
Addended by: [name] on: 04/09/2020 10:21 AM   Modules accepted: Orders

## 2020-04-09 NOTE — Telephone Encounter (Signed)
Patient has asked that someone give her a call regarding previous messages.  Thank you

## 2020-04-09 NOTE — Telephone Encounter (Signed)
Patient is applying for assistance from [employer] for her Earlie Server. She will need to send the prescription with the application. Can you please print a prescription for her. I will call her to pick up when ready. Thanks, MS LPN

## 2020-04-09 NOTE — Telephone Encounter (Signed)
Patient notified that form & prescription are ready to be picked up.

## 2020-04-09 NOTE — Telephone Encounter (Signed)
Rx was left up front in the folder with pt's name on it for pick up.

## 2020-04-10 ENCOUNTER — Telehealth: Payer: Self-pay | Admitting: Family Medicine

## 2020-04-10 NOTE — Telephone Encounter (Signed)
Attempted to reach patient for CCM appointment.  Left voicemail to return call.

## 2020-04-10 NOTE — Progress Notes (Signed)
  Chronic Care Management   Outreach Note  04/10/2020 Name: Rebecca Barnes MRN: [id number] DOB: 07/11/1946  Referred by: [name], DO Reason for referral : No chief complaint on file.   An unsuccessful telephone outreach was attempted today. The patient was referred to the pharmacist for assistance with care management and care coordination.   Follow Up Plan:   Lynnae January Upstream Scheduler

## 2020-04-16 LAB — HM DIABETES EYE EXAM

## 2020-04-18 ENCOUNTER — Encounter: Payer: Self-pay | Admitting: Family Medicine

## 2020-04-22 ENCOUNTER — Other Ambulatory Visit: Payer: Self-pay

## 2020-04-23 ENCOUNTER — Encounter: Payer: Self-pay | Admitting: Family Medicine

## 2020-04-23 ENCOUNTER — Ambulatory Visit (INDEPENDENT_AMBULATORY_CARE_PROVIDER_SITE_OTHER): Payer: Medicare Other | Admitting: Family Medicine

## 2020-04-23 VITALS — BP 138/82 | HR 109 | Temp 98.6°F | Ht 65.0 in | Wt 264.4 lb

## 2020-04-23 DIAGNOSIS — E119 Type 2 diabetes mellitus without complications: Secondary | ICD-10-CM | POA: Diagnosis not present

## 2020-04-23 DIAGNOSIS — S39011A Strain of muscle, fascia and tendon of abdomen, initial encounter: Secondary | ICD-10-CM

## 2020-04-23 DIAGNOSIS — I10 Essential (primary) hypertension: Secondary | ICD-10-CM

## 2020-04-23 DIAGNOSIS — F418 Other specified anxiety disorders: Secondary | ICD-10-CM | POA: Diagnosis not present

## 2020-04-23 LAB — LIPID PANEL
Cholesterol: 189 mg/dL (ref 0–200)
HDL: 69.7 mg/dL (ref >39.00)
LDL Cholesterol: 86 mg/dL (ref 0–99)
NonHDL: 118.89
Total CHOL/HDL Ratio: 3
Triglycerides: 165 mg/dL — ABNORMAL HIGH (ref 0.0–149.0)
VLDL: 33 mg/dL (ref 0.0–40.0)

## 2020-04-23 LAB — BASIC METABOLIC PANEL
BUN: 7 mg/dL (ref 6–23)
CO2: 25 mEq/L (ref 19–32)
Calcium: 9.4 mg/dL (ref 8.4–10.5)
Chloride: 93 mEq/L — ABNORMAL LOW (ref 96–112)
Creatinine, Ser: 0.58 mg/dL (ref 0.40–1.20)
GFR: 101.62 mL/min (ref >60.00)
Glucose, Bld: 221 mg/dL — ABNORMAL HIGH (ref 70–99)
Potassium: 3.9 mEq/L (ref 3.5–5.1)
Sodium: 128 mEq/L — ABNORMAL LOW (ref 135–145)

## 2020-04-23 LAB — HEMOGLOBIN A1C: Hgb A1c MFr Bld: 7.8 % — ABNORMAL HIGH (ref 4.6–6.5)

## 2020-04-23 LAB — ALT: ALT: 19 U/L (ref 0–35)

## 2020-04-23 LAB — AST: AST: 19 U/L (ref 0–37)

## 2020-04-23 MED ORDER — LOSARTAN POTASSIUM 100 MG PO TABS: 100.0000 mg | ORAL_TABLET | Freq: Every day | ORAL | 3 refills | Status: DC

## 2020-04-23 MED ORDER — ROSUVASTATIN CALCIUM 5 MG PO TABS: 5.0000 mg | ORAL_TABLET | Freq: Every day | ORAL | 3 refills | Status: DC

## 2020-04-23 MED ORDER — CLONAZEPAM 0.5 MG PO TABS: 0.5000 mg | ORAL_TABLET | Freq: Two times a day (BID) | ORAL | 2 refills | Status: DC | PRN

## 2020-04-23 MED ORDER — AMLODIPINE BESYLATE 5 MG PO TABS: 5.0000 mg | ORAL_TABLET | Freq: Every day | ORAL | 3 refills | Status: DC

## 2020-04-23 MED ORDER — IBUPROFEN 600 MG PO TABS: 600.0000 mg | ORAL_TABLET | Freq: Two times a day (BID) | ORAL | 0 refills | Status: DC | PRN

## 2020-04-23 MED ORDER — METFORMIN HCL 1000 MG PO TABS: 1000.0000 mg | ORAL_TABLET | Freq: Two times a day (BID) | ORAL | 2 refills | Status: DC

## 2020-04-23 MED ORDER — CYCLOBENZAPRINE HCL 5 MG PO TABS: 5.0000 mg | ORAL_TABLET | Freq: Every day | ORAL | 0 refills | Status: DC

## 2020-04-23 NOTE — Patient Instructions (Addendum)
[email or web address]  [facility]: [email or web address]  [facility] [email or web address]  [facility] [email or web address]  [email or web address]   For your side: Warm compress 2-3x/day - 15 min on then off Take ibuprofen 600mg  2x/day with food x 7-10 days Take flexeril 5mg  at bedtime x 3-4 nights - may make sleepy Gentle stretching exercise after heating pad

## 2020-04-23 NOTE — Progress Notes (Signed)
Rebecca Barnes is a 74 y.o. female  Chief Complaint  Patient presents with  . Fall    fell last Sunday//pt said her right side and hip is in alot of pain an asking for something for pain//pt needs refills on clonasepam and metformin    HPI: Rebecca Barnes is a 74 y.o. female who complains of falling 1+ week ago onto her right side. She crawled into her bathroom and tried to pull herself up. She feels she pulled a muscle in her Rt side as she was working to get herself up.  She has a PMHx significant for CVA and states her balance is not that great.   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 She needs a refill of her clonazepam.   She is overdue for labs for HTN, DM. She needs med refills   Past Medical History:  Diagnosis Date  . Anxiety   . Asthma   . Diabetes mellitus without complication (HCC)   . Hypertension   . Stroke [facility])     Past Surgical History:  Procedure Laterality Date  . arm fracture     arm surgery  . CHOLECYSTECTOMY      Social History   Socioeconomic History  . Marital status: Widowed    Spouse name: Not on file  . Number of children: Not on file  . Years of education: Not on file  . Highest education level: Not on file  Occupational History  . Occupation: Retired  Tobacco Use  . Smoking status: Never Smoker  . Smokeless tobacco: Never Used  Substance and Sexual Activity  . Alcohol use: Never  . Drug use: Yes    Types: Marijuana    Comment: ocassionally  . Sexual activity: Not Currently  Other Topics Concern  . Not on file  Social History Narrative  . Not on file   Social Determinants of Health   Financial Resource Strain: Low Risk   . Difficulty of Paying Living Expenses: Not hard at all  Food Insecurity: No Food Insecurity  . Worried About Programme researcher, broadcasting/film/video in the Last Year: Never true  . Ran Out of Food in the Last Year: Never true  Transportation Needs: No Transportation  Needs  . Lack of Transportation (Medical): No  . Lack of Transportation (Non-Medical): No  Physical Activity: Inactive  . Days of Exercise per Week: 0 days  . Minutes of Exercise per Session: 0 min  Stress: No Stress Concern Present  . Feeling of Stress : Not at all  Social Connections: Socially Isolated  . Frequency of Communication with Friends and Family: More than three times a week  . Frequency of Social Gatherings with Friends and Family: Once a week  . Attends Religious Services: Never  . Active Member of Clubs or Organizations: No  . Attends Banker Meetings: Never  . Marital Status: Widowed  Intimate Partner Violence: Not At Risk  . Fear of Current or Ex-Partner: No  . Emotionally Abused: No  . Physically Abused: No  . Sexually Abused: No    Family History  Problem Relation Age of Onset  . Cancer Mother      Immunization History  Administered Date(s) Administered  . Fluad Quad(high Dose 65+) 06/06/2019  . Influenza-Unspecified 04/16/2017  . Pneumococcal Conjugate-13 09/17/2016  . Pneumococcal Polysaccharide-23 02/11/2009, 07/06/2018  . Tdap 09/17/2016    Outpatient Encounter Medications as of 04/23/2020  Medication  Sig  . amLODipine (NORVASC) 5 MG tablet TAKE 1 TABLET(5 MG) BY MOUTH DAILY  . busPIRone (BUSPAR) 5 MG tablet TAKE 1 TABLET(5 MG) BY MOUTH TWICE DAILY  . clonazePAM (KLONOPIN) 0.5 MG tablet Take 1 tablet (0.5 mg total) by mouth 2 (two) times daily as needed for anxiety.  Marland Kitchen FLUoxetine (PROZAC) 20 MG capsule Take 1 capsule (20 mg total) by mouth daily.  . fluticasone furoate-vilanterol (BREO ELLIPTA) 100-25 MCG/INH AEPB Inhale 1 puff into the lungs daily.  Marland Kitchen losartan (COZAAR) 100 MG tablet TAKE 1 TABLET BY MOUTH EVERY DAY  . metFORMIN (GLUCOPHAGE) 1000 MG tablet Take 1 tablet (1,000 mg total) by mouth 2 (two) times daily with a meal.  . Multiple Vitamin (MULTI-VITAMIN) tablet Take by mouth.  . rosuvastatin (CRESTOR) 5 MG tablet Take by mouth.     No facility-administered encounter medications on file as of 04/23/2020.     ROS: Pertinent positives and negatives noted in HPI. Remainder of ROS non-contributory   Allergies  Allergen Reactions  . Sitagliptin     Other reaction(s): DIZZINESS. (JANUVIA)  . Sulfa Antibiotics     (SULFA)    BP 138/82 (BP Location: Right Arm, Patient Position: Sitting, Cuff Size: Large)   Pulse (!) 109   Temp 98.6 F (37 C) (Temporal)   Ht 5\' 5"  (1.651 m)   Wt 264 lb 6.4 oz (119.9 kg)   SpO2 97%   BMI 44.00 kg/m   Physical Exam Constitutional:      General: She is not in acute distress.    Appearance: She is obese. She is not ill-appearing.  Cardiovascular:     Rate and Rhythm: Tachycardia present.     Pulses: Normal pulses.     Heart sounds: Normal heart sounds.  Pulmonary:     Effort: Pulmonary effort is normal.     Breath sounds: Normal breath sounds. No wheezing.  Musculoskeletal:     Right lower leg: No edema.     Left lower leg: No edema.  Neurological:     Mental Status: She is alert.  Psychiatric:        Attention and Perception: Attention normal.        Mood and Affect: Mood is anxious.        Speech: Speech normal.        Behavior: Behavior normal.      A/P:   1. Type 2 diabetes mellitus without complication, without long-term current use of insulin (HCC) - controlled, not regularly checking BS at home - Hemoglobin A1c - ALT - AST - Lipid panel - Microalbumin / creatinine urine ratio Refill: - metFORMIN (GLUCOPHAGE) 1000 MG tablet; Take 1 tablet (1,000 mg total) by mouth 2 (two) times daily with a meal.  Dispense: 180 tablet; Refill: 2 - rosuvastatin (CRESTOR) 5 MG tablet; Take 1 tablet (5 mg total) by mouth daily.  Dispense: 90 tablet; Refill: 3 - f/u in 3 mo  2. Essential hypertension - controlled, at goal - Basic metabolic panel Refill: - losartan (COZAAR) 100 MG tablet; Take 1 tablet (100 mg total) by mouth daily.  Dispense: 90 tablet; Refill: 3 -  amLODipine (NORVASC) 5 MG tablet; Take 1 tablet (5 mg total) by mouth daily.  Dispense: 90 tablet; Refill: 3  3. Anxiety with depression - not well-controlled - names of BH counselors/groups provided - strongly encouraged pt to establish care - cont prozac Refill: - clonazePAM (KLONOPIN) 0.5 MG tablet; Take 1 tablet (0.5 mg total) by mouth 2 (two)  times daily as needed for anxiety.  Dispense: 60 tablet; Refill: 2 - database reviewed and appropriate  4. Strain of abdominal wall, initial encounter - heat 2-3x/day followed by ROM/stretching exercises Rx: - ibuprofen (ADVIL) 600 MG tablet; Take 1 tablet (600 mg total) by mouth 2 (two) times daily as needed.  Dispense: 30 tablet; Refill: 0 - take BID with food x 7-10 days Rx: - cyclobenzaprine (FLEXERIL) 5 MG tablet; Take 1 tablet (5 mg total) by mouth at bedtime.  Dispense: 30 tablet; Refill: 0 - pt to take at bedtime x 3-4 nights   This visit occurred during the SARS-CoV-2 public health emergency.  Safety protocols were in place, including screening questions prior to the visit, additional usage of staff PPE, and extensive cleaning of exam room while observing appropriate contact time as indicated for disinfecting solutions.

## 2020-05-05 ENCOUNTER — Other Ambulatory Visit: Payer: Self-pay

## 2020-05-05 ENCOUNTER — Telehealth: Payer: Self-pay | Admitting: *Deleted

## 2020-05-05 ENCOUNTER — Emergency Department (HOSPITAL_BASED_OUTPATIENT_CLINIC_OR_DEPARTMENT_OTHER)
Admission: EM | Admit: 2020-05-05 | Discharge: 2020-05-05 | Disposition: A | Discharge status: Home / Self Care | Payer: Medicare Other | Attending: Emergency Medicine | Admitting: Emergency Medicine

## 2020-05-05 ENCOUNTER — Telehealth: Payer: Self-pay | Admitting: Family Medicine

## 2020-05-05 ENCOUNTER — Encounter (HOSPITAL_BASED_OUTPATIENT_CLINIC_OR_DEPARTMENT_OTHER): Payer: Self-pay | Admitting: Emergency Medicine

## 2020-05-05 ENCOUNTER — Emergency Department (HOSPITAL_BASED_OUTPATIENT_CLINIC_OR_DEPARTMENT_OTHER): Payer: Medicare Other

## 2020-05-05 DIAGNOSIS — Z79899 Other long term (current) drug therapy: Secondary | ICD-10-CM | POA: Insufficient documentation

## 2020-05-05 DIAGNOSIS — E119 Type 2 diabetes mellitus without complications: Secondary | ICD-10-CM | POA: Diagnosis not present

## 2020-05-05 DIAGNOSIS — R42 Dizziness and giddiness: Secondary | ICD-10-CM | POA: Diagnosis present

## 2020-05-05 DIAGNOSIS — I10 Essential (primary) hypertension: Secondary | ICD-10-CM | POA: Insufficient documentation

## 2020-05-05 DIAGNOSIS — J45909 Unspecified asthma, uncomplicated: Secondary | ICD-10-CM | POA: Insufficient documentation

## 2020-05-05 DIAGNOSIS — Z7984 Long term (current) use of oral hypoglycemic drugs: Secondary | ICD-10-CM | POA: Diagnosis not present

## 2020-05-05 LAB — CBC
HCT: 43.8 % (ref 36.0–46.0)
Hemoglobin: 14.5 g/dL (ref 12.0–15.0)
MCH: 27 pg (ref 26.0–34.0)
MCHC: 33.1 g/dL (ref 30.0–36.0)
MCV: 81.6 fL (ref 80.0–100.0)
Platelets: 434 10*3/uL — ABNORMAL HIGH (ref 150–400)
RBC: 5.37 MIL/uL — ABNORMAL HIGH (ref 3.87–5.11)
RDW: 13.9 % (ref 11.5–15.5)
WBC: 12.7 10*3/uL — ABNORMAL HIGH (ref 4.0–10.5)
nRBC: 0 % (ref 0.0–0.2)

## 2020-05-05 LAB — URINALYSIS, ROUTINE W REFLEX MICROSCOPIC
Bilirubin Urine: NEGATIVE
Glucose, UA: 250 mg/dL — AB
Ketones, ur: NEGATIVE mg/dL
Leukocytes,Ua: NEGATIVE
Nitrite: NEGATIVE
Protein, ur: 30 mg/dL — AB
Specific Gravity, Urine: 1.02 (ref 1.005–1.030)
pH: 6.5 (ref 5.0–8.0)

## 2020-05-05 LAB — BASIC METABOLIC PANEL
Anion gap: 14 (ref 5–15)
BUN: 12 mg/dL (ref 8–23)
CO2: 22 mmol/L (ref 22–32)
Calcium: 9.3 mg/dL (ref 8.9–10.3)
Chloride: 95 mmol/L — ABNORMAL LOW (ref 98–111)
Creatinine, Ser: 0.57 mg/dL (ref 0.44–1.00)
GFR calc Af Amer: >60 mL/min (ref >60)
GFR calc non Af Amer: >60 mL/min (ref >60)
Glucose, Bld: 238 mg/dL — ABNORMAL HIGH (ref 70–99)
Potassium: 3.4 mmol/L — ABNORMAL LOW (ref 3.5–5.1)
Sodium: 131 mmol/L — ABNORMAL LOW (ref 135–145)

## 2020-05-05 LAB — URINALYSIS, MICROSCOPIC (REFLEX)

## 2020-05-05 IMAGING — CT CT HEAD W/O CM
3 series · 15 of 47 positions shown, 18 images · non-contrast
Comparison: None.

CLINICAL DATA: Dizziness with recent fall

EXAM:
CT HEAD WITHOUT CONTRAST
TECHNIQUE: Contiguous axial images were obtained from the base of the skull
through the vertex without intravenous contrast.

[Series 2: head wo · axial · 0.40mm/px · z∈[-171,-41]mm · 9 of 32 slices shown, 12 images]
[im 3/32  brain]
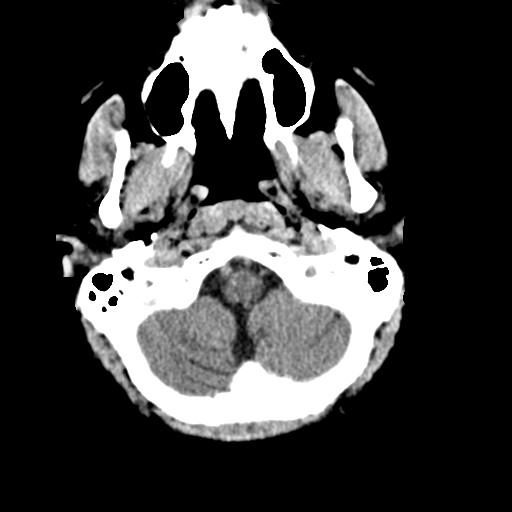
[im 3/32  bone]
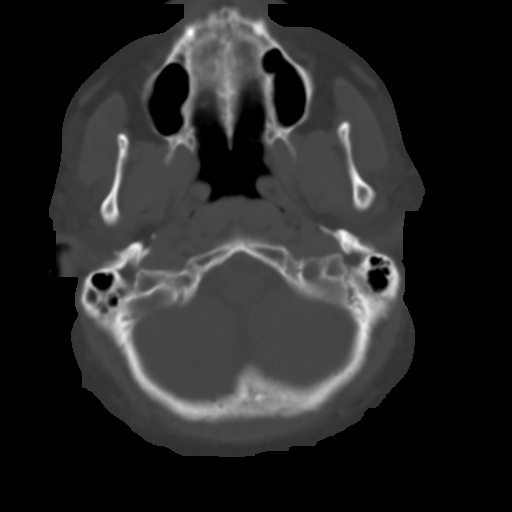
[im 6/32  brain]
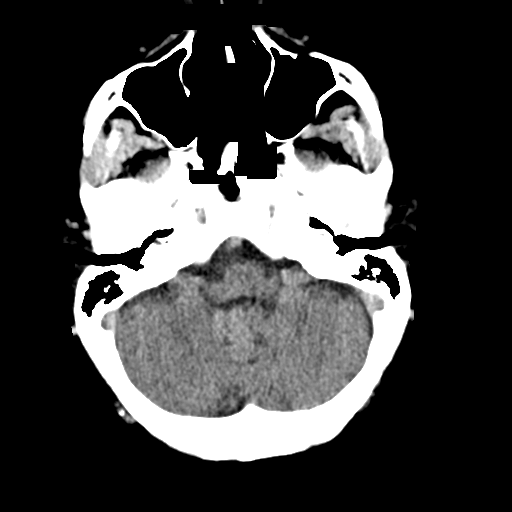
[im 9/32  brain]
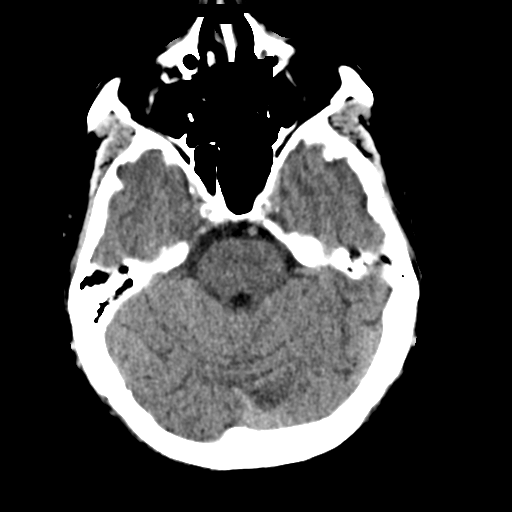
[im 12/32  brain]
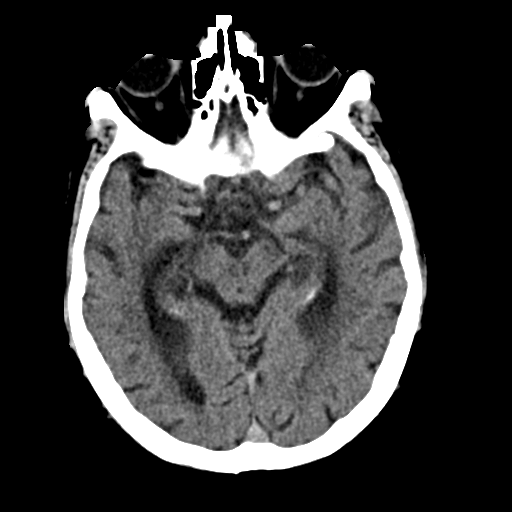
[im 17/32  brain]
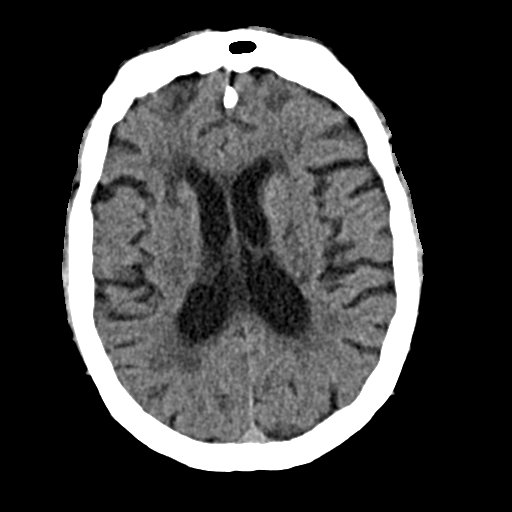
[im 17/32  bone]
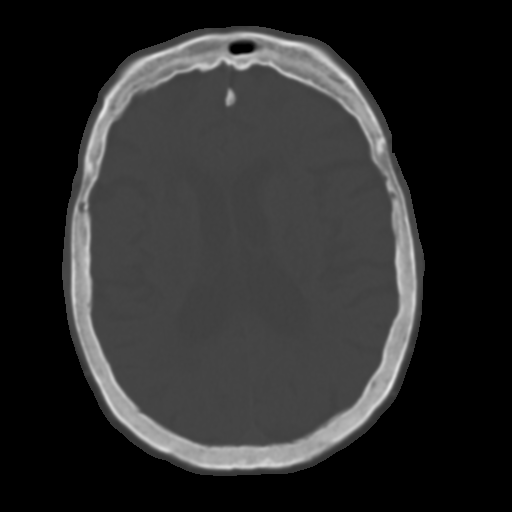
[im 20/32  brain]
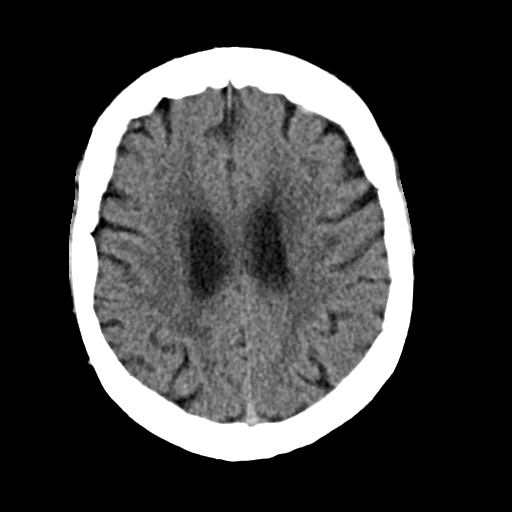
[im 23/32  brain]
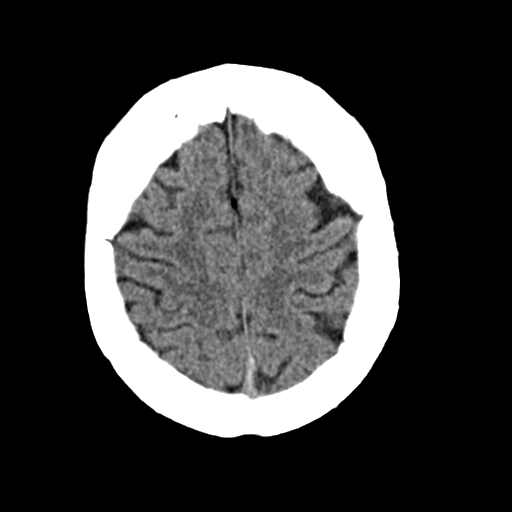
[im 26/32  brain]
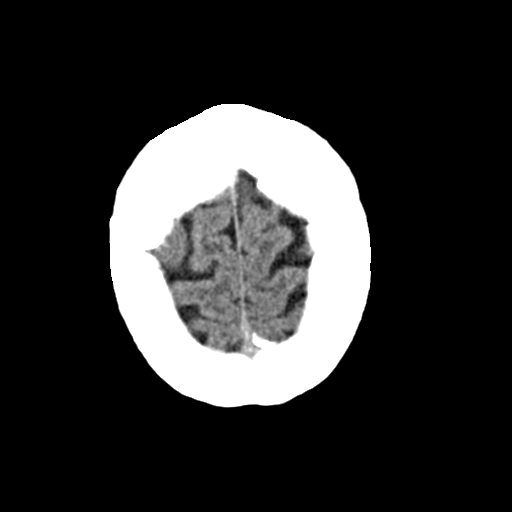
[im 29/32  brain]
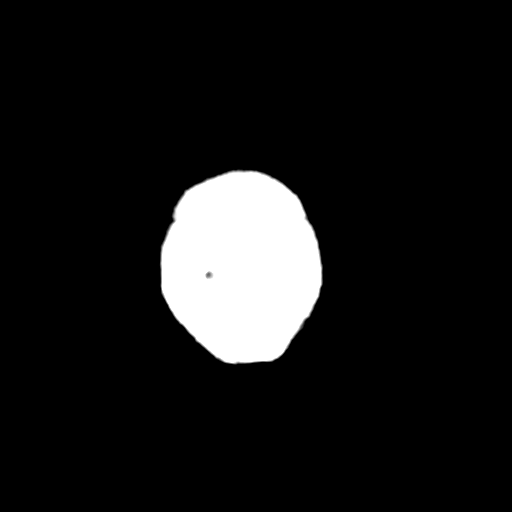
[im 29/32  bone]
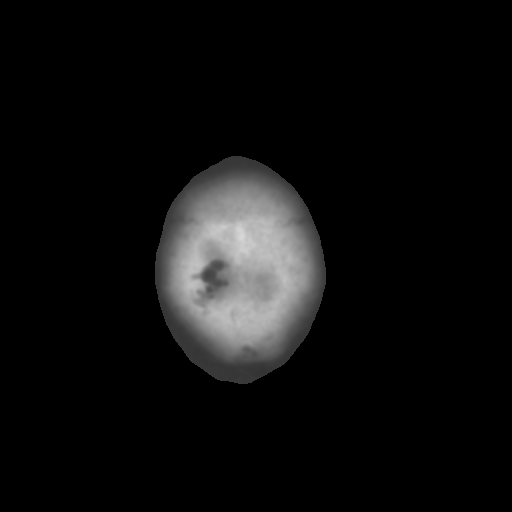

[Series 4: coronal soft · coronal · 0.31mm/px · 3 of 73 slices shown]
[im 25/73  brain]
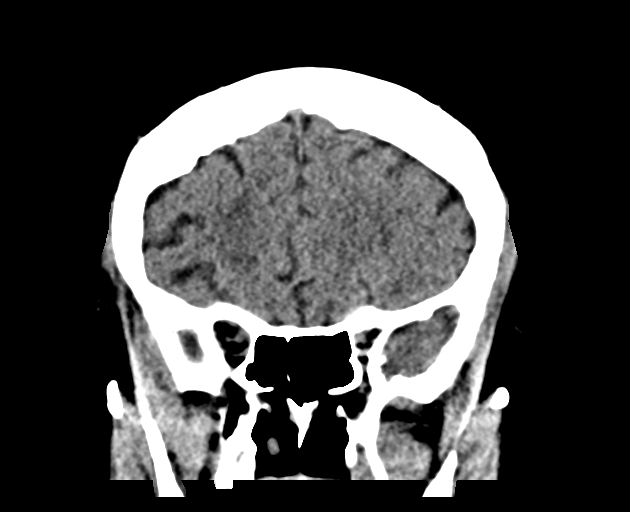
[im 33/73  brain]
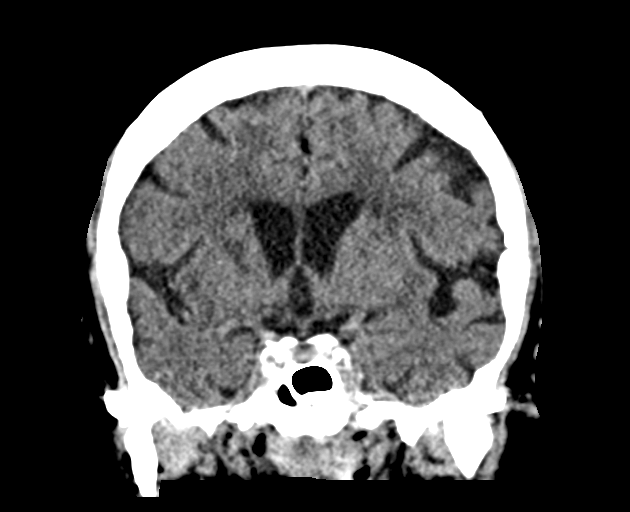
[im 41/73  brain]
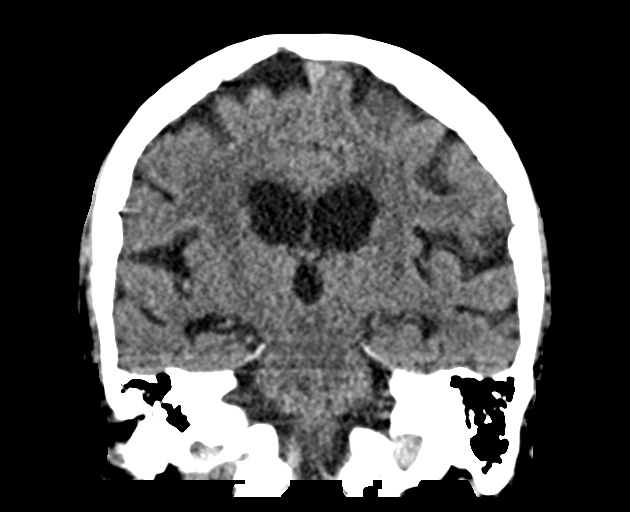

[Series 5: sag soft · sagittal · 0.31mm/px · 3 of 67 slices shown]
[im 23/67  brain]
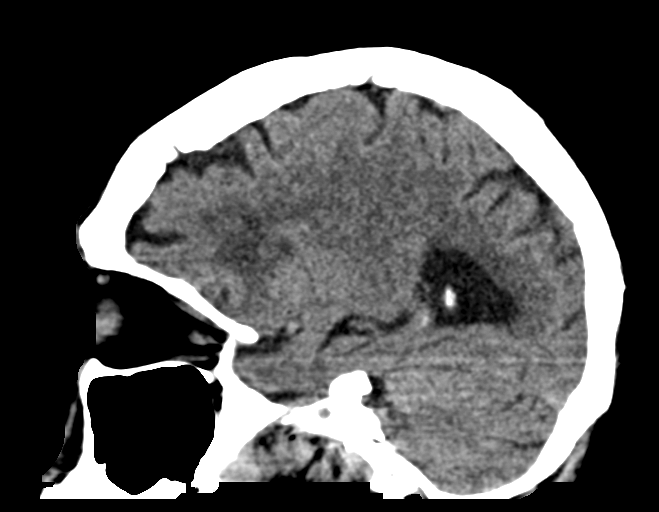
[im 34/67  brain]
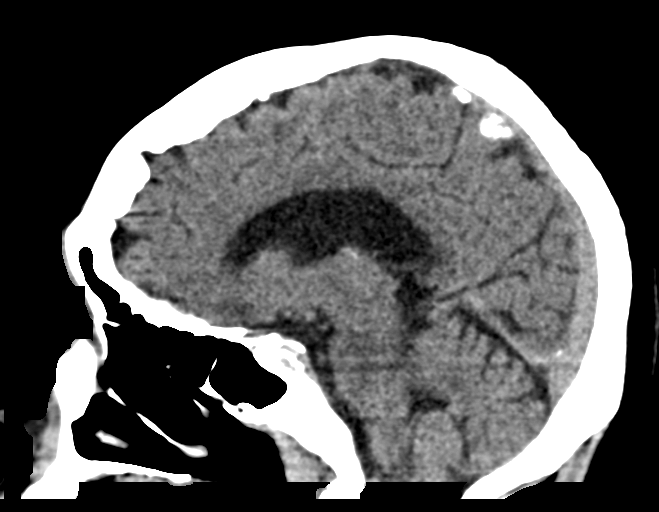
[im 45/67  brain]
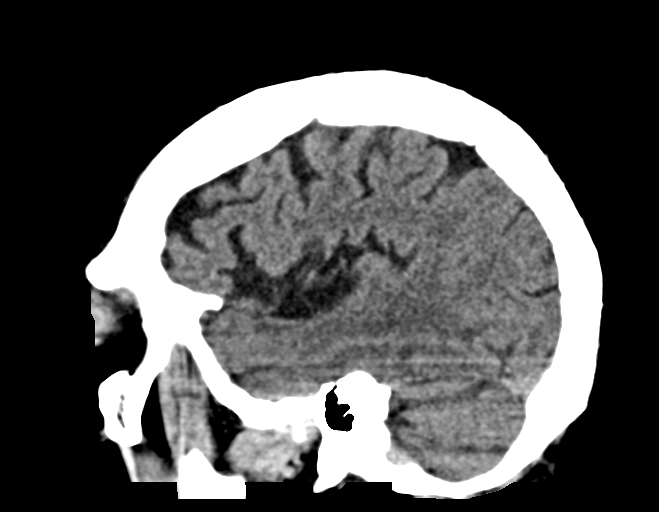

[15 of 47 positions shown; findings below may reference images not displayed]

FINDINGS: Brain: There is mild to moderate diffuse atrophy. There is no
intracranial mass, hemorrhage, extra-axial fluid collection, or
midline shift. There is small vessel disease in the centra semiovale
bilaterally. There is evidence of a small prior lacunar infarct in
the inferior left centrum semiovale. Small vessel disease is
apparent in the mid pons region. No acute appearing infarct is
evident on this study.

Vascular: There is no hyperdense vessel. There is calcification in
each carotid siphon region.

Skull: The bony calvarium appears intact.

Sinuses/Orbits: Paranasal sinuses are clear. Orbits appear symmetric
bilaterally.

Other: Mastoid air cells are clear.
IMPRESSION: Atrophy with small vessel disease in the periventricular white
matter and mid pons region. Prior small lacunar infarct inferior
left centrum semiovale. No acute infarct. No mass or hemorrhage.

Foci of arterial vascular calcification noted.

## 2020-05-05 MED ORDER — MECLIZINE HCL 25 MG PO TABS: 25.0000 mg | ORAL_TABLET | Freq: Three times a day (TID) | ORAL | 0 refills | Status: DC | PRN

## 2020-05-05 MED ORDER — MECLIZINE HCL 25 MG PO TABS
25.0000 mg | ORAL_TABLET | Freq: Once | ORAL | Status: AC
  Administered 2020-05-05: 25 mg via ORAL
  Filled 2020-05-05: qty 1

## 2020-05-05 NOTE — Telephone Encounter (Signed)
Patient is calling and stated that she experiencing some dizziness and has had some recent falls. Transferred caller to the nurse triage for further evaluation.

## 2020-05-05 NOTE — ED Triage Notes (Signed)
Pt from home brought in by PT[facility] stating her anxiety is really high started on crestor  And she thinks  It is making her dizzy she stopped it and then her daughter told her to take it and she got dizzy agagin and fell last night, pt is very anxious

## 2020-05-05 NOTE — Telephone Encounter (Signed)
TOC CM referral to assist with SNF. Message sent to ED provider necessary  Paperwork for [facility]. Dtr states she has Rollator. She has stopped eating her meals. Dtr scheduled a f/u appt for 05/09/2020. Explained she can have MD assist with SNF placement from home setting. States the long term goal is to put in a Senior home and/or ALF. Dtr has CM contact information for any assistance. Isidoro Donning RN CCM, [facility] ED TOC CM [phone]

## 2020-05-05 NOTE — ED Notes (Signed)
Pt back from CT her IV is out

## 2020-05-05 NOTE — ED Provider Notes (Signed)
[facility] EMERGENCY DEPARTMENT Provider Note  CSN: [id number] Arrival date & time: 05/05/20 1028    History Chief Complaint  Patient presents with  . Dizziness  . Fall  . Anxiety    HPI  Rebecca Barnes is a 74 y.o. female with history of gait problems and anxiety was recently started on Crestor by her PCP. Daughter at bedside provides much of the history. She reports the patient took her first and only dose of Crestor 2 nights ago. She reports she had onset of room-spinning dizziness the following day. Some positional component to her dizziness but not consistently so. She had a fall last night but denies any injuries. She has had several falls over the last few months. Had done some PT at one point which helped. The patient lives alone and gets very anxious at times. She takes Klonopin for same. She denies any numbness, weakness, facial droop, slurred speech. She has not had any fever, cough, congestion, CP or SOB. No abdominal pain, N/V/D. She reports polyuria, but no dysuria.    Past Medical History:  Diagnosis Date  . Anxiety   . Asthma   . Diabetes mellitus without complication (HCC)   . Hypertension   . Stroke [facility])     Past Surgical History:  Procedure Laterality Date  . arm fracture     arm surgery  . CHOLECYSTECTOMY      Family History  Problem Relation Age of Onset  . Cancer Mother     Social History   Tobacco Use  . Smoking status: Never Smoker  . Smokeless tobacco: Never Used  Substance Use Topics  . Alcohol use: Never  . Drug use: Yes    Types: Marijuana    Comment: ocassionally     Home Medications Prior to Admission medications   Medication Sig Start Date End Date Taking? Authorizing Provider  amLODipine (NORVASC) 5 MG tablet Take 1 tablet (5 mg total) by mouth daily. 04/23/20   Cirigliano, Mary K, DO  busPIRone (BUSPAR) 5 MG tablet TAKE 1 TABLET(5 MG) BY MOUTH TWICE DAILY 03/03/20   Cirigliano, Jearld Lesch, DO  clonazePAM (KLONOPIN) 0.5  MG tablet Take 1 tablet (0.5 mg total) by mouth 2 (two) times daily as needed for anxiety. 04/23/20   Cirigliano, Jearld Lesch, DO  cyclobenzaprine (FLEXERIL) 5 MG tablet Take 1 tablet (5 mg total) by mouth at bedtime. 04/23/20   Cirigliano, Jearld Lesch, DO  FLUoxetine (PROZAC) 20 MG capsule Take 1 capsule (20 mg total) by mouth daily. 10/25/19   Cirigliano, Mary K, DO  fluticasone furoate-vilanterol (BREO ELLIPTA) 100-25 MCG/INH AEPB Inhale 1 puff into the lungs daily. 04/09/20   Cirigliano, Jearld Lesch, DO  ibuprofen (ADVIL) 600 MG tablet Take 1 tablet (600 mg total) by mouth 2 (two) times daily as needed. 04/23/20   Cirigliano, Jearld Lesch, DO  losartan (COZAAR) 100 MG tablet Take 1 tablet (100 mg total) by mouth daily. 04/23/20   Cirigliano, Jearld Lesch, DO  meclizine (ANTIVERT) 25 MG tablet Take 1 tablet (25 mg total) by mouth 3 (three) times daily as needed for dizziness. 05/05/20   Pollyann Savoy, MD  metFORMIN (GLUCOPHAGE) 1000 MG tablet Take 1 tablet (1,000 mg total) by mouth 2 (two) times daily with a meal. 04/23/20   Cirigliano, Jearld Lesch, DO  Multiple Vitamin (MULTI-VITAMIN) tablet Take by mouth.    [provider]  rosuvastatin (CRESTOR) 5 MG tablet Take 1 tablet (5 mg total) by mouth daily. 04/23/20  [name], DO     Allergies    Sitagliptin and Sulfa antibiotics   Review of Systems   Review of Systems A comprehensive review of systems was completed and negative except as noted in HPI.    Physical Exam BP (!) 152/65 (BP Location: Right Arm)   Pulse (!) 107   Temp 98.3 F (36.8 C)   Resp 19   Ht 5\' 5"  (1.651 m)   Wt 121.6 kg   SpO2 95%   BMI 44.60 kg/m   Physical Exam Vitals and nursing note reviewed.  Constitutional:      Appearance: Normal appearance.  HENT:     Head: Normocephalic and atraumatic.     Right Ear: Tympanic membrane normal.     Left Ear: Tympanic membrane normal.     Nose: Nose normal.     Mouth/Throat:     Mouth: Mucous membranes are moist.  Eyes:      Extraocular Movements: Extraocular movements intact.     Conjunctiva/sclera: Conjunctivae normal.  Cardiovascular:     Rate and Rhythm: Normal rate.  Pulmonary:     Effort: Pulmonary effort is normal.     Breath sounds: Normal breath sounds.  Abdominal:     General: Abdomen is flat.     Palpations: Abdomen is soft.     Tenderness: There is no abdominal tenderness.  Musculoskeletal:        General: No swelling. Normal range of motion.     Cervical back: Neck supple.  Skin:    General: Skin is warm and dry.  Neurological:     General: No focal deficit present.     Mental Status: She is alert.     Cranial Nerves: No cranial nerve deficit.     Sensory: No sensory deficit.     Motor: No weakness.     Coordination: Coordination normal.  Psychiatric:        Mood and Affect: Mood normal.      ED Results / Procedures / Treatments   Labs (all labs ordered are listed, but only abnormal results are displayed) Labs Reviewed  BASIC METABOLIC PANEL - Abnormal; Notable for the following components:      Result Value   Sodium 131 (*)    Potassium 3.4 (*)    Chloride 95 (*)    Glucose, Bld 238 (*)    All other components within normal limits  CBC - Abnormal; Notable for the following components:   WBC 12.7 (*)    RBC 5.37 (*)    Platelets 434 (*)    All other components within normal limits  URINALYSIS, ROUTINE W REFLEX MICROSCOPIC - Abnormal; Notable for the following components:   APPearance CLOUDY (*)    Glucose, UA 250 (*)    Hgb urine dipstick TRACE (*)    Protein, ur 30 (*)    All other components within normal limits  URINALYSIS, MICROSCOPIC (REFLEX) - Abnormal; Notable for the following components:   Bacteria, UA FEW (*)    All other components within normal limits  URINE CULTURE    EKG EKG Interpretation  Date/Time:  Monday May 05 2020 10:42:20 EDT Ventricular Rate:  110 PR Interval:    QRS Duration: 102 QT Interval:  349 QTC Calculation: 473 R  Axis:   29 Text Interpretation: Sinus tachycardia Consider right atrial enlargement Baseline wander in lead(s) I II aVR No significant change since last tracing Confirmed by [id number] [phone]) on 05/05/2020 10:44:36 AM  Radiology CT Head Wo Contrast  Result Date: 05/05/2020 CLINICAL DATA:  Dizziness with recent fall EXAM: CT HEAD WITHOUT CONTRAST TECHNIQUE: Contiguous axial images were obtained from the base of the skull through the vertex without intravenous contrast. COMPARISON:  None. FINDINGS: Brain: There is mild to moderate diffuse atrophy. There is no intracranial mass, hemorrhage, extra-axial fluid collection, or midline shift. There is small vessel disease in the centra semiovale bilaterally. There is evidence of a small prior lacunar infarct in the inferior left centrum semiovale. Small vessel disease is apparent in the mid pons region. No acute appearing infarct is evident on this study. Vascular: There is no hyperdense vessel. There is calcification in each carotid siphon region. Skull: The bony calvarium appears intact. Sinuses/Orbits: Paranasal sinuses are clear. Orbits appear symmetric bilaterally. Other: Mastoid air cells are clear. IMPRESSION: Atrophy with small vessel disease in the periventricular white matter and mid pons region. Prior small lacunar infarct inferior left centrum semiovale. No acute infarct. No mass or hemorrhage. Foci of arterial vascular calcification noted. Electronically Signed   By: Bretta Bang III M.D.   On: 05/05/2020 11:18    Procedures Procedures  Medications Ordered in the ED Medications  meclizine (ANTIVERT) tablet 25 mg (25 mg Oral Given 05/05/20 1157)     MDM Rules/Calculators/A&P MDM Patient with nonspecific dizziness and anxiety with a fall last night. She describes the dizziness as vertiginous. Will check her labs, including UA and add head CT. EKG with mild tachycardia, but otherwise unremarkable. Exam is benign.  ED Course  I  have reviewed the triage vital signs and the nursing notes.  Pertinent labs & imaging results that were available during my care of the patient were reviewed by me and considered in my medical decision making (see chart for details).  Clinical Course as of May 05 1406  Mon May 05, 2020  1123 CT neg for acute process. CBC with mildly elevated WBC but otherwise normal.    [CS]  1133 BMP shows mild hyperglycemia, Na is about at baseline.    [CS]  1339 Patient refuses in-and-out cath, will continue to encourage to provide a urine sample for analysis. Otherwise ED workup unremarkable thus far.    [CS]  1358 UA with few bacterial but not convincing for UTI. Will send for culture. Patient reports symptom improvement and would like to go home. Plan Rx for Meclizine for vertiginous dizziness. Daughter requesting call back from [facility] to assist with getting the patient into an independent living facility. PCP followup. RTED for any other concerns.    [CS]    Clinical Course User Index [CS] Pollyann Savoy, MD    Final Clinical Impression(s) / ED Diagnoses Final diagnoses:  Vertigo    Rx / DC Orders ED Discharge Orders         Ordered    meclizine (ANTIVERT) 25 MG tablet  3 times daily PRN        05/05/20 1406           Pollyann Savoy, MD 05/05/20 1407

## 2020-05-05 NOTE — ED Triage Notes (Signed)
Per EMS: pt stated on Crestor on Friday, dizziness started Friday evening.  Pt fell at home yesterday.

## 2020-05-06 LAB — URINE CULTURE

## 2020-05-09 ENCOUNTER — Ambulatory Visit (INDEPENDENT_AMBULATORY_CARE_PROVIDER_SITE_OTHER): Payer: Medicare Other | Admitting: Family Medicine

## 2020-05-09 ENCOUNTER — Other Ambulatory Visit: Payer: Self-pay

## 2020-05-09 ENCOUNTER — Encounter: Payer: Self-pay | Admitting: Family Medicine

## 2020-05-09 VITALS — BP 140/76 | HR 105 | Temp 96.8°F | Ht 65.0 in | Wt 263.4 lb

## 2020-05-09 DIAGNOSIS — R42 Dizziness and giddiness: Secondary | ICD-10-CM | POA: Diagnosis not present

## 2020-05-09 DIAGNOSIS — Z23 Encounter for immunization: Secondary | ICD-10-CM

## 2020-05-09 DIAGNOSIS — Z9181 History of falling: Secondary | ICD-10-CM

## 2020-05-09 DIAGNOSIS — F418 Other specified anxiety disorders: Secondary | ICD-10-CM

## 2020-05-09 NOTE — Progress Notes (Signed)
Rebecca Barnes is a 74 y.o. female  Chief Complaint  Patient presents with  . Hospitalization Follow-up    Vertigo 05/05/20, wants flu shot today    HPI: Rebecca Barnes is a 74 y.o. female seen today for ER f/u. She is alone for this appt. Pt was seen at [facility] ER on 05/05/20 for vertigo. ER physician note states pt experienced symptoms after started new med - crestor - recently Rx'd by PCP however pt has been on this med for quite some time. It was refilled at her last OV. CT head was done and no acute findings. Labs remarkable for mildly increased WBC, mild hyponatremia and hypokalemia, and urine culture showed "multiple species present" and repeat/recollection was advised.   Today pt denies dizziness, lightheadedness. She is taking meclizine once per day and feels this works Personnel officer" She has been using a Teaching laboratory technician. She feels this helps her balance and makes her less anxious about falling.   Pt is open to moving to ALF and daughter is helping pt with that. They have appt set up next week (pt is unsure of name of facility, it is in [place] near her daughter)  Pt has significant anxiety, depression - not new. She is open to seeing a Veterinary surgeon.  [facility] [phone] Karin Golden  Past Medical History:  Diagnosis Date  . Anxiety   . Asthma   . Diabetes mellitus without complication (HCC)   . Hypertension   . Stroke [facility])     Past Surgical History:  Procedure Laterality Date  . arm fracture     arm surgery  . CHOLECYSTECTOMY      Social History   Socioeconomic History  . Marital status: Widowed    Spouse name: Not on file  . Number of children: Not on file  . Years of education: Not on file  . Highest education level: Not on file  Occupational History  . Occupation: Retired  Tobacco Use  . Smoking status: Never Smoker  . Smokeless tobacco: Never Used  Substance and Sexual Activity  . Alcohol use: Never  . Drug use: Yes     Types: Marijuana    Comment: ocassionally  . Sexual activity: Not Currently  Other Topics Concern  . Not on file  Social History Narrative  . Not on file   Social Determinants of Health   Financial Resource Strain: Low Risk   . Difficulty of Paying Living Expenses: Not hard at all  Food Insecurity: No Food Insecurity  . Worried About Programme researcher, broadcasting/film/video in the Last Year: Never true  . Ran Out of Food in the Last Year: Never true  Transportation Needs: No Transportation Needs  . Lack of Transportation (Medical): No  . Lack of Transportation (Non-Medical): No  Physical Activity: Inactive  . Days of Exercise per Week: 0 days  . Minutes of Exercise per Session: 0 min  Stress: No Stress Concern Present  . Feeling of Stress : Not at all  Social Connections: Socially Isolated  . Frequency of Communication with Friends and Family: More than three times a week  . Frequency of Social Gatherings with Friends and Family: Once a week  . Attends Religious Services: Never  . Active Member of Clubs or Organizations: No  . Attends Banker Meetings: Never  . Marital Status: Widowed  Intimate Partner Violence: Not At Risk  . Fear of Current or Ex-Partner: No  . Emotionally Abused: No  . Physically Abused:  No  . Sexually Abused: No    Family History  Problem Relation Age of Onset  . Cancer Mother      Immunization History  Administered Date(s) Administered  . Fluad Quad(high Dose 65+) 06/06/2019  . Influenza-Unspecified 04/16/2017  . Pneumococcal Conjugate-13 09/17/2016  . Pneumococcal Polysaccharide-23 02/11/2009, 07/06/2018  . Tdap 09/17/2016    Outpatient Encounter Medications as of 05/09/2020  Medication Sig  . amLODipine (NORVASC) 5 MG tablet Take 1 tablet (5 mg total) by mouth daily.  . busPIRone (BUSPAR) 5 MG tablet TAKE 1 TABLET(5 MG) BY MOUTH TWICE DAILY  . clonazePAM (KLONOPIN) 0.5 MG tablet Take 1 tablet (0.5 mg total) by mouth 2 (two) times daily as needed  for anxiety.  . cyclobenzaprine (FLEXERIL) 5 MG tablet Take 1 tablet (5 mg total) by mouth at bedtime.  Marland Kitchen FLUoxetine (PROZAC) 20 MG capsule Take 1 capsule (20 mg total) by mouth daily.  . fluticasone furoate-vilanterol (BREO ELLIPTA) 100-25 MCG/INH AEPB Inhale 1 puff into the lungs daily.  Marland Kitchen ibuprofen (ADVIL) 600 MG tablet Take 1 tablet (600 mg total) by mouth 2 (two) times daily as needed.  Marland Kitchen losartan (COZAAR) 100 MG tablet Take 1 tablet (100 mg total) by mouth daily.  . metFORMIN (GLUCOPHAGE) 1000 MG tablet Take 1 tablet (1,000 mg total) by mouth 2 (two) times daily with a meal.  . Multiple Vitamin (MULTI-VITAMIN) tablet Take by mouth.  . rosuvastatin (CRESTOR) 5 MG tablet Take 1 tablet (5 mg total) by mouth daily.  . meclizine (ANTIVERT) 25 MG tablet Take 1 tablet (25 mg total) by mouth 3 (three) times daily as needed for dizziness.   No facility-administered encounter medications on file as of 05/09/2020.     ROS: Pertinent positives and negatives noted in HPI. Remainder of ROS non-contributory    Allergies  Allergen Reactions  . Sitagliptin     Other reaction(s): DIZZINESS. (JANUVIA)  . Sulfa Antibiotics     (SULFA)    BP 140/76   Pulse (!) 105   Temp (!) 96.8 F (36 C) (Temporal)   Ht 5\' 5"  (1.651 m)   Wt 263 lb 6.4 oz (119.5 kg)   SpO2 94%   BMI 43.83 kg/m    BP Readings from Last 3 Encounters:  05/09/20 140/76  05/05/20 (!) 152/65  04/23/20 138/82   Pulse Readings from Last 3 Encounters:  05/09/20 (!) 105  05/05/20 (!) 107  04/23/20 (!) 109   Wt Readings from Last 3 Encounters:  05/09/20 263 lb 6.4 oz (119.5 kg)  05/05/20 268 lb (121.6 kg)  04/23/20 264 lb 6.4 oz (119.9 kg)     Physical Exam Constitutional:      General: She is not in acute distress.    Appearance: She is obese. She is not ill-appearing.  HENT:     Right Ear: Tympanic membrane and ear canal normal.     Left Ear: Tympanic membrane and ear canal normal.  Cardiovascular:     Rate  and Rhythm: Normal rate and regular rhythm.     Pulses: Normal pulses.  Pulmonary:     Effort: Pulmonary effort is normal. No respiratory distress.  Lymphadenopathy:     Cervical: No cervical adenopathy.  Neurological:     Mental Status: She is alert and oriented to person, place, and time. Mental status is at baseline.  Psychiatric:        Mood and Affect: Affect is tearful.      A/P:  1. Need for influenza vaccination -  Flu Vaccine QUAD High Dose(Fluad)  2. At high risk for falls - using rollator walker and pt feels more stable with this - pt and daughter are looking into ALF. Pt understands need for this but is also anxious about it  3. Anxiety with depression - long-standing issue, worse since husband died and pt lives alone - Ambulatory referral to Psychology  4. Vertigo - resolved, taking meclizine once daily - gave pt Brandt-Darhof exercises to do at home   This visit occurred during the SARS-CoV-2 public health emergency.  Safety protocols were in place, including screening questions prior to the visit, additional usage of staff PPE, and extensive cleaning of exam room while observing appropriate contact time as indicated for disinfecting solutions.

## 2020-05-09 NOTE — Patient Instructions (Addendum)
[facility] [phone] Karin Golden Referral placed today, ok to call to schedule

## 2020-05-27 ENCOUNTER — Encounter: Payer: Self-pay | Admitting: Family Medicine

## 2020-06-17 ENCOUNTER — Telehealth: Payer: Self-pay | Admitting: Family Medicine

## 2020-06-17 NOTE — Telephone Encounter (Signed)
Deridre from [facility] health requesting call back from Dr Frankey Shown nurse at [phone]. Please advise

## 2020-06-18 ENCOUNTER — Other Ambulatory Visit: Payer: Self-pay

## 2020-06-18 NOTE — Progress Notes (Signed)
Added new medication yto patients med list , per [facility].

## 2020-06-18 NOTE — Telephone Encounter (Signed)
I spoke with Rebecca Barnes and she is requesting orders for Nursing Home Health, disease and medication management.

## 2020-06-18 NOTE — Telephone Encounter (Signed)
Ok to give verbal order for services requested 

## 2020-06-19 NOTE — Telephone Encounter (Signed)
Ok to give verbal order for all requests below

## 2020-06-19 NOTE — Telephone Encounter (Signed)
Rebecca Barnes from [facility] calling about occupational therapy, 1 time a week for 4 weeks for ADL Transfers and IADL. Rebecca Barnes's number is [phone], Ok to leave message on his secure voicemail

## 2020-06-19 NOTE — Telephone Encounter (Signed)
lft a VM on secure line that occupational therapy is okay per Dr Salena Saner to [facility].   Then called and spoke to Deridre @ [facility] to give verbal okay for nursing home health and medication management.  Dm/cma

## 2020-06-20 ENCOUNTER — Telehealth: Payer: Self-pay | Admitting: Family Medicine

## 2020-06-20 NOTE — Telephone Encounter (Signed)
[facility]ise[facility] is calling in regards to patients Amlodipine. She states that patient has been taking 5mg  instead of 10mg . Her blood pressure today was 140/70. Rehab ordered Plavix 75mg  for 1 week, but patient never received a prescription for it. Please give her a call back at [phone] and advise.

## 2020-06-20 NOTE — Telephone Encounter (Signed)
plavix was to be for 21 days end date of 06/18/20 and then transition to 325mg  aspirin so at this point pt can continue on 325mg  aspirin

## 2020-06-20 NOTE — Telephone Encounter (Signed)
Spoke to [place], patient's BP is well controled on the Amlodipine 5 mg and  Wants to know since patient never received the weeks worth of plavix from Rehab and is now on Aspirin 325 mg daily, what your thoughts were. Please review and advise.   Thanks.   Dm/cma

## 2020-06-23 NOTE — Telephone Encounter (Signed)
Spoke to [employer], and advise of Dr [employer] recommendations.   No further questions.  Dm/cma

## 2020-06-25 ENCOUNTER — Inpatient Hospital Stay: Payer: Medicare Other | Admitting: Family Medicine

## 2020-06-26 ENCOUNTER — Other Ambulatory Visit: Payer: Self-pay

## 2020-06-27 ENCOUNTER — Encounter: Payer: Self-pay | Admitting: Family Medicine

## 2020-06-27 ENCOUNTER — Ambulatory Visit (INDEPENDENT_AMBULATORY_CARE_PROVIDER_SITE_OTHER): Payer: Medicare Other | Admitting: Family Medicine

## 2020-06-27 VITALS — BP 124/70 | HR 75 | Temp 98.6°F | Ht 65.0 in | Wt 259.2 lb

## 2020-06-27 DIAGNOSIS — R531 Weakness: Secondary | ICD-10-CM | POA: Diagnosis not present

## 2020-06-27 DIAGNOSIS — I639 Cerebral infarction, unspecified: Secondary | ICD-10-CM | POA: Diagnosis not present

## 2020-06-27 DIAGNOSIS — E119 Type 2 diabetes mellitus without complications: Secondary | ICD-10-CM

## 2020-06-27 DIAGNOSIS — I10 Essential (primary) hypertension: Secondary | ICD-10-CM | POA: Diagnosis not present

## 2020-06-27 NOTE — Patient Instructions (Addendum)
Shingrix (shingles vaccine) 2 shot series  Covid booster  You do not need to be taking: crestor (rosuvastatin) Baby aspirin 81mg  daily   You should be taking: Atorvastatin (lipitor) 80mg  daily Aspirin 325mg  daily

## 2020-06-27 NOTE — Progress Notes (Signed)
Rebecca Barnes is a 74 y.o. female  Chief Complaint  Patient presents with  . Hospitalization Follow-up    hospital f/u (05/30/20) stroke,    HPI: Rebecca Barnes is a 74 y.o. female is seen today with her daughter for f/u after hospitalization and STR for CVA. She was admitted to [facility] on 05/28/2020 with recurrent falls, dizziness and ataxia. Seen by neurology, diagnosed with acute left pontine infarct. Treated with aspirin, Plavix (21 days) then recommended to de-escalate to aspirin 325 mg monotherapy along with lipitor 80mg  daily.  D/C recommendations: -Check lipid panel (cholesterol) in 6-9 months. Last LDL was 46 on 10/6 - goal LDL <70 -Check Hgb A1c (diabetes) in 3 months Last Hba1c 7.3 on 10/6 - goal A1C <7 -Regular BP monitoring  Hospital records reviewed by me today.   Pt is doing PT for Rt knee pain. She is ambulating with a walker. She has home health aid, OT, PT, speech therapy.    Past Medical History:  Diagnosis Date  . Anxiety   . Asthma   . Diabetes mellitus without complication (HCC)   . Hypertension   . Stroke [facility])     Past Surgical History:  Procedure Laterality Date  . arm fracture     arm surgery  . CHOLECYSTECTOMY      Social History   Socioeconomic History  . Marital status: Widowed    Spouse name: Not on file  . Number of children: Not on file  . Years of education: Not on file  . Highest education level: Not on file  Occupational History  . Occupation: Retired  Tobacco Use  . Smoking status: Never Smoker  . Smokeless tobacco: Never Used  Substance and Sexual Activity  . Alcohol use: Never  . Drug use: Yes    Types: Marijuana    Comment: ocassionally  . Sexual activity: Not Currently  Other Topics Concern  . Not on file  Social History Narrative  . Not on file   Social Determinants of Health   Financial Resource Strain: Low Risk   . Difficulty of Paying Living Expenses: Not hard at all  Food Insecurity: No Food Insecurity  .  Worried About [facility] in the Last Year: Never true  . Ran Out of Food in the Last Year: Never true  Transportation Needs: No Transportation Needs  . Lack of Transportation (Medical): No  . Lack of Transportation (Non-Medical): No  Physical Activity: Inactive  . Days of Exercise per Week: 0 days  . Minutes of Exercise per Session: 0 min  Stress: No Stress Concern Present  . Feeling of Stress : Not at all  Social Connections: Socially Isolated  . Frequency of Communication with Friends and Family: More than three times a week  . Frequency of Social Gatherings with Friends and Family: Once a week  . Attends Religious Services: Never  . Active Member of Clubs or Organizations: No  . Attends Programme researcher, broadcasting/film/video Meetings: Never  . Marital Status: Widowed  Intimate Partner Violence: Not At Risk  . Fear of Current or Ex-Partner: No  . Emotionally Abused: No  . Physically Abused: No  . Sexually Abused: No    Family History  Problem Relation Age of Onset  . Cancer Mother      Immunization History  Administered Date(s) Administered  . Fluad Quad(high Dose 65+) 06/06/2019  . Influenza-Unspecified 04/16/2017, 05/08/2020  . Janssen (J&J) SARS-COV-2 Vaccination 11/04/2019  . Pneumococcal Conjugate-13 09/17/2016  . Pneumococcal Polysaccharide-23  02/11/2009, 07/06/2018  . Tdap 09/17/2016    Outpatient Encounter Medications as of 06/27/2020  Medication Sig  . acetaminophen (TYLENOL) 325 MG tablet Take by mouth.  Marland Kitchen amLODipine (NORVASC) 5 MG tablet Take 1 tablet (5 mg total) by mouth daily.  Marland Kitchen aspirin 325 MG tablet Take by mouth.  Marland Kitchen atorvastatin (LIPITOR) 80 MG tablet Take by mouth.  . bisacodyl (DULCOLAX) 10 MG suppository Place rectally.  . busPIRone (BUSPAR) 5 MG tablet TAKE 1 TABLET(5 MG) BY MOUTH TWICE DAILY  . clonazePAM (KLONOPIN) 0.5 MG tablet Take 1 tablet (0.5 mg total) by mouth 2 (two) times daily as needed for anxiety.  Marland Kitchen FLUoxetine (PROZAC) 20 MG capsule Take 1  capsule (20 mg total) by mouth daily.  Marland Kitchen losartan (COZAAR) 100 MG tablet Take 1 tablet (100 mg total) by mouth daily.  . metFORMIN (GLUCOPHAGE-XR) 500 MG 24 hr tablet Take by mouth.  . Multiple Vitamin (MULTI-VITAMIN) tablet Take by mouth.  . senna-docusate (SENOKOT-S) 8.6-50 MG tablet Take by mouth.  . vitamin B-12 (CYANOCOBALAMIN) 500 MCG tablet Take by mouth.  . [DISCONTINUED] clopidogrel (PLAVIX) 75 MG tablet Take by mouth.  . [DISCONTINUED] rosuvastatin (CRESTOR) 5 MG tablet Take 1 tablet (5 mg total) by mouth daily.  . [DISCONTINUED] amLODipine (NORVASC) 10 MG tablet Take by mouth. (Patient not taking: Reported on 06/27/2020)  . [DISCONTINUED] cyclobenzaprine (FLEXERIL) 5 MG tablet Take 1 tablet (5 mg total) by mouth at bedtime. (Patient not taking: Reported on 06/27/2020)  . [DISCONTINUED] fluticasone furoate-vilanterol (BREO ELLIPTA) 100-25 MCG/INH AEPB Inhale 1 puff into the lungs daily. (Patient not taking: Reported on 06/27/2020)  . [DISCONTINUED] ibuprofen (ADVIL) 600 MG tablet Take 1 tablet (600 mg total) by mouth 2 (two) times daily as needed. (Patient not taking: Reported on 06/27/2020)  . [DISCONTINUED] meclizine (ANTIVERT) 25 MG tablet Take 1 tablet (25 mg total) by mouth 3 (three) times daily as needed for dizziness. (Patient not taking: Reported on 06/27/2020)   No facility-administered encounter medications on file as of 06/27/2020.     ROS: Pertinent positives and negatives noted in HPI. Remainder of ROS non-contributory   Allergies  Allergen Reactions  . Sitagliptin     Other reaction(s): DIZZINESS. (JANUVIA)  . Sulfa Antibiotics     (SULFA)    BP 124/70   Pulse 75   Temp 98.6 F (37 C) (Temporal)   Ht 5\' 5"  (1.651 m)   Wt 259 lb 3.2 oz (117.6 kg)   SpO2 93%   BMI 43.13 kg/m    BP Readings from Last 3 Encounters:  06/27/20 124/70  05/09/20 140/76  05/05/20 (!) 152/65   Pulse Readings from Last 3 Encounters:  06/27/20 75  05/09/20 (!) 105  05/05/20 (!)  107   Wt Readings from Last 3 Encounters:  06/27/20 259 lb 3.2 oz (117.6 kg)  05/09/20 263 lb 6.4 oz (119.5 kg)  05/05/20 268 lb (121.6 kg)    Physical Exam Constitutional:      General: She is not in acute distress.    Appearance: She is obese. She is not ill-appearing.  Cardiovascular:     Rate and Rhythm: Normal rate and regular rhythm.  Pulmonary:     Effort: No respiratory distress.  Musculoskeletal:     Right lower leg: No edema.     Left lower leg: No edema.  Neurological:     General: No focal deficit present.     Mental Status: She is alert and oriented to person, place, and time.  Psychiatric:        Mood and Affect: Mood normal.        Behavior: Behavior normal.      A/P:  1. Cerebrovascular accident (CVA), unspecified mechanism (HCC) - minimal residual effects - on ASA 325mg , lipitor 80mg  daily - cont with HH - PT, OT, speech therapy - ambulating with walker  - lipid panel in/around 11/2020  2. Essential hypertension - controlled, at goal - cont current meds - amlodipine 5mg  daily, losartan 100mg  daily  3. Type 2 diabetes mellitus without complication, without long-term current use of insulin (HCC) - last A1C = 7.3 (05/2020) - due in 08/2020 - cont on metformin XR 500mg  daily  4. Generalized weakness - cont with home PT/OT    This visit occurred during the SARS-CoV-2 public health emergency.  Safety protocols were in place, including screening questions prior to the visit, additional usage of staff PPE, and extensive cleaning of exam room while observing appropriate contact time as indicated for disinfecting solutions.

## 2020-07-03 ENCOUNTER — Telehealth: Payer: Medicare Other | Admitting: Family Medicine

## 2020-07-03 ENCOUNTER — Telehealth: Payer: Self-pay

## 2020-07-03 NOTE — Telephone Encounter (Signed)
Pt experienced a fall yesterday, loss of balance, w/no injury. Deirdre, with [facility] wanted to let pt's PCP know.

## 2020-07-03 NOTE — Telephone Encounter (Signed)
Message below noted  Thank you

## 2020-07-21 NOTE — Telephone Encounter (Signed)
Spoke to patient and she states that she has only been taking 500 mg of Metformin daily and wants to know if she should increase to the 1000 mg daily due to her last lab results? She also needs a refill on the Buspirone 5 mg.  She took the last one today. patient aware that you ar not in the office till weds and okay with waiting for response.    Please review and advise.  Thanks.  Dm/cma

## 2020-07-21 NOTE — Telephone Encounter (Signed)
Caller Name: Dynesha Call back phone #: [phone]  MEDICATION(S): metformin 500mg  1/day - pt said that this is what she currently has. She asked for a phone call to notify if needing to be changed based on the msg below.  Pt also needing busPIRone 5mg .   Pt is out of both medications  Has the patient contacted their pharmacy? Yes.    Preferred Pharmacy: [facility], #[id number] - [address] AT [facility] [address]  [address], [place] [facility]

## 2020-07-21 NOTE — Telephone Encounter (Signed)
lft VM to rtn call. Dm/cma  

## 2020-07-22 ENCOUNTER — Other Ambulatory Visit: Payer: Self-pay

## 2020-07-22 MED ORDER — METFORMIN HCL ER (OSM) 1000 MG PO TB24
1000.0000 mg | ORAL_TABLET | Freq: Every day | ORAL | 1 refills | Status: DC
Start: 2020-07-22 — End: 2020-10-29

## 2020-07-22 MED ORDER — BUSPIRONE HCL 5 MG PO TABS
ORAL_TABLET | ORAL | 0 refills | Status: DC
Start: 2020-07-22 — End: 2020-10-24

## 2020-07-22 NOTE — Telephone Encounter (Signed)
lft VM that metformin was changed to 1000  Mg daily and refill of Buspar sent to the pharmacy.  Dm/cma

## 2020-07-22 NOTE — Addendum Note (Signed)
Addended by: Waymond Cera on: 07/22/2020 04:26 PM   Modules accepted: Orders

## 2020-08-04 ENCOUNTER — Encounter: Payer: Self-pay | Admitting: Family Medicine

## 2020-08-11 ENCOUNTER — Telehealth: Payer: Self-pay

## 2020-08-11 DIAGNOSIS — Z111 Encounter for screening for respiratory tuberculosis: Secondary | ICD-10-CM

## 2020-08-11 NOTE — Telephone Encounter (Signed)
Patient calling to inquire about some lab work that is needed for nursing home.  Patient stated that a TB gold blood test is needed and also a form needs to be signed and faxed off to [facility]. Fax# [phone], [facility]. Pt is asking to come in for the lab on Thursday of this week. Please advise.  CB# [phone]

## 2020-08-13 NOTE — Telephone Encounter (Signed)
Lab ordered and pt can schedule lab appt. Form can be dropped off for completion

## 2020-08-14 ENCOUNTER — Other Ambulatory Visit: Payer: Self-pay

## 2020-08-14 ENCOUNTER — Other Ambulatory Visit (INDEPENDENT_AMBULATORY_CARE_PROVIDER_SITE_OTHER): Payer: Medicare Other

## 2020-08-14 DIAGNOSIS — Z111 Encounter for screening for respiratory tuberculosis: Secondary | ICD-10-CM

## 2020-08-14 NOTE — Telephone Encounter (Signed)
Patient dropped off FL-2 form to be completed by her provider. She will also need a copy of her labs sent with the FL-2. Patient is also requesting orders for a hospital bed. Form placed in folder in front office.

## 2020-08-17 LAB — QUANTIFERON-TB GOLD PLUS
Mitogen-NIL: >10 IU/mL
NIL: 0.05 IU/mL
QuantiFERON-TB Gold Plus: NEGATIVE
TB1-NIL: <0 IU/mL
TB2-NIL: 0 IU/mL

## 2020-08-19 NOTE — Telephone Encounter (Signed)
Patients daughter is calling to see if paperwork can be completed by Friday.

## 2020-08-19 NOTE — Telephone Encounter (Signed)
Pt's daughter calling about the FL2 form.  Pt's daughter would like to know if it would be done by the 30th of this month.  I informed the daughter that Dr. Salena Saner is out of the office until next Wednesday, and that I would send message out tomorrow to our NP that will be here helping out.  Please advise.  CB# [phone]

## 2020-08-20 NOTE — Telephone Encounter (Signed)
After showing Hyman Hopes the forms, notice they require an MD sig.

## 2020-08-21 ENCOUNTER — Other Ambulatory Visit: Payer: Self-pay | Admitting: Family Medicine

## 2020-08-21 DIAGNOSIS — F418 Other specified anxiety disorders: Secondary | ICD-10-CM

## 2020-08-21 NOTE — Telephone Encounter (Signed)
lft VM to rtn call @ [phone], daughter.  Dm/cma

## 2020-08-21 NOTE — Telephone Encounter (Signed)
Spoke to [place]  @ [employer], since provider is out of office, they will accept signature of a NP. Form given to provider to fill out.  Dm/cma

## 2020-08-21 NOTE — Telephone Encounter (Signed)
Filled out the FL-2 form after Worthy Rancher signed it.  Called and spoke to [place] @ [facility] to help get information correct on the form then faxed it to [phone] Attn: Olivia Mackie Hopan.  Dm/cma

## 2020-08-25 NOTE — Telephone Encounter (Signed)
Last OV 06/27/20 Last fill 10/25/19  #90/3

## 2020-10-24 ENCOUNTER — Other Ambulatory Visit: Payer: Self-pay | Admitting: Family Medicine

## 2020-10-24 NOTE — Telephone Encounter (Signed)
Refill request for   Buspirone 5 mg  LR 07/22/20, #180, 0 rf LOV 06/27/20 FOV  None scheduled.   please review and advise. Thanks.  Dm/cma

## 2020-10-28 ENCOUNTER — Telehealth: Payer: Self-pay | Admitting: Family Medicine

## 2020-10-28 DIAGNOSIS — F418 Other specified anxiety disorders: Secondary | ICD-10-CM

## 2020-10-28 NOTE — Telephone Encounter (Signed)
Pt called and said she is now using the [facility] on [address] [place], [place] [address], she needs meds sent in 1. FLUoxetine (PROZAC) 20 MG capsule 2. metformin (FORTAMET) 1000 MG (OSM) 24 hr tablet 3. busPIRone (BUSPAR) 5 MG tablet (This was filled on 10/24/20 but was sent to her old pharmacy so she wanted to know if it could be sent here instead)

## 2020-10-29 ENCOUNTER — Other Ambulatory Visit: Payer: Self-pay

## 2020-10-29 DIAGNOSIS — I10 Essential (primary) hypertension: Secondary | ICD-10-CM

## 2020-10-29 DIAGNOSIS — F418 Other specified anxiety disorders: Secondary | ICD-10-CM

## 2020-10-29 MED ORDER — METFORMIN HCL ER (OSM) 1000 MG PO TB24
1000.0000 mg | ORAL_TABLET | Freq: Every day | ORAL | 1 refills | Status: DC
Start: 2020-10-29 — End: 2020-11-06

## 2020-10-29 MED ORDER — FLUOXETINE HCL 20 MG PO CAPS: ORAL_CAPSULE | ORAL | 1 refills | Status: DC

## 2020-10-29 MED ORDER — BUSPIRONE HCL 5 MG PO TABS
ORAL_TABLET | ORAL | 1 refills | Status: DC
Start: 2020-10-29 — End: 2021-03-05

## 2020-10-29 NOTE — Telephone Encounter (Signed)
Sent a message to Dr. Salena Saner about refill requests.

## 2020-10-29 NOTE — Telephone Encounter (Signed)
Please refill requested meds for 90 day supply and 1 RF

## 2020-10-29 NOTE — Telephone Encounter (Signed)
I have them teed up and ready for you.

## 2020-10-29 NOTE — Telephone Encounter (Signed)
Pt request pharmacy change and for medication to be sent to new pharmacy. Last OV 06/27/20  Last fill for Fluoxetine 08/25/20  #90/3 Last fill for Metformin 11//  #90/1 Last fill Buspirone /  #180/0/

## 2020-10-29 NOTE — Telephone Encounter (Signed)
Refills sent. Thank you!

## 2020-10-30 MED ORDER — CLONAZEPAM 0.5 MG PO TABS: 0.5000 mg | ORAL_TABLET | Freq: Two times a day (BID) | ORAL | 2 refills | Status: DC | PRN

## 2020-10-30 NOTE — Telephone Encounter (Signed)
Refill sent.

## 2020-10-30 NOTE — Telephone Encounter (Signed)
Pt called and forgot to request refill for clonazePAM (KLONOPIN) 0.5 MG tablet as well

## 2020-11-05 NOTE — Telephone Encounter (Signed)
Rebecca Barnes (Key: [id number]) [phone] metFORMIN HCl ER (OSM) 1000MG  er tablets     Status: PA Response - Denied [employer] denied medication due to it not in pt's plan Drug list.  Metformin ER, or provider need a specific medical reasons why covered drug is not appropriate for pt.  Please advise.

## 2020-11-06 MED ORDER — METFORMIN HCL ER 500 MG PO TB24: 1000.0000 mg | ORAL_TABLET | Freq: Every day | ORAL | 3 refills | Status: DC

## 2020-11-06 NOTE — Addendum Note (Signed)
Addended by: [name] on: 11/06/2020 10:02 AM   Modules accepted: Orders

## 2020-11-06 NOTE — Telephone Encounter (Signed)
New Rx sent.

## 2020-12-03 ENCOUNTER — Telehealth: Payer: Self-pay | Admitting: Family Medicine

## 2020-12-03 NOTE — Telephone Encounter (Signed)
Steven-[facility] is calling to get verbal orders for  PT 1 week 1, 2 week 2, and 1 week 2. Please call him at [phone].

## 2020-12-03 NOTE — Telephone Encounter (Signed)
I LM for pt to call the office to schedule an office visit with provider within 28days.

## 2020-12-03 NOTE — Telephone Encounter (Signed)
Steven-from [facility] has been informed with the [facility], per Dr. Doreene Burke.

## 2020-12-03 NOTE — Telephone Encounter (Signed)
I called and spoke with Rebecca Barnes to inform him that Dr. Salena Saner is ooo for the rest of this week.  Rebecca Barnes asked if another provider could possibly give the orders, pt has had a recent stroke in Nov and has had 2 falls in the 2 weeks.  I told him that I would send to our office doctor of the day and give him a call back.  Please advise.

## 2020-12-03 NOTE — Telephone Encounter (Signed)
Okay to refer? 

## 2020-12-11 NOTE — Telephone Encounter (Signed)
Melissa from [facility] is calling to get verbal orders for OT twice a week for 2 weeks and once a week for 5 weeks. Please call her back at [phone].

## 2020-12-11 NOTE — Telephone Encounter (Signed)
Left a detailed message on voicemail, informing Melissa of the ok for verbal order, per Dr. Salena Saner.

## 2020-12-11 NOTE — Telephone Encounter (Signed)
Ok for verbal order for requested order

## 2020-12-21 ENCOUNTER — Other Ambulatory Visit: Payer: Self-pay | Admitting: Family Medicine

## 2020-12-21 DIAGNOSIS — F418 Other specified anxiety disorders: Secondary | ICD-10-CM

## 2020-12-22 NOTE — Telephone Encounter (Signed)
Last OV 06/27/20 Last fill 10/30/20  #60/2

## 2021-01-02 ENCOUNTER — Telehealth: Payer: Self-pay | Admitting: Family Medicine

## 2021-01-02 NOTE — Telephone Encounter (Signed)
I spoke with Rebecca Barnes, Clinical manager for [facility],  and informed her that Dr. Salena Saner is out of the office until Wednesday of next week.  Rebecca Barnes said that is ok for Dr. Salena Saner to get back to them next week.  Rebecca Barnes said that if she is not there, to leave the message with Clydie Braun.

## 2021-01-02 NOTE — Telephone Encounter (Signed)
[name] Clinical manager at [facility] calling so they can get an order for skilled nursing for pt. Callback [phone]

## 2021-01-08 NOTE — Telephone Encounter (Signed)
I called the number below and [name] was not in today.  I spoke with [facility] and gave her the ok for skilled nursing, per Dr. Salena Saner.

## 2021-01-08 NOTE — Telephone Encounter (Signed)
Ok to give verbal order for skilled nursing for pt

## 2021-02-05 ENCOUNTER — Telehealth: Payer: Medicare Other | Admitting: Family Medicine

## 2021-02-05 ENCOUNTER — Other Ambulatory Visit: Payer: Self-pay

## 2021-02-05 NOTE — Progress Notes (Signed)
Pt cancelled appt

## 2021-02-24 ENCOUNTER — Ambulatory Visit: Payer: Medicare Other

## 2021-02-24 ENCOUNTER — Telehealth: Payer: Self-pay | Admitting: Family Medicine

## 2021-02-24 NOTE — Telephone Encounter (Signed)
Left message for patient to call back and reschedule 02/24/21 Medicare Annual Wellness Visit (AWV) in office.   If not able to come in office, please offer to do virtually or by telephone.   Last AWV:02/13/2020  Please schedule at anytime with Nurse Health Advisor.

## 2021-02-26 ENCOUNTER — Telehealth: Payer: Medicare Other | Admitting: Family Medicine

## 2021-03-05 ENCOUNTER — Encounter: Payer: Self-pay | Admitting: Family Medicine

## 2021-03-05 ENCOUNTER — Telehealth (INDEPENDENT_AMBULATORY_CARE_PROVIDER_SITE_OTHER): Payer: Medicare Other | Admitting: Family Medicine

## 2021-03-05 DIAGNOSIS — I10 Essential (primary) hypertension: Secondary | ICD-10-CM

## 2021-03-05 DIAGNOSIS — F418 Other specified anxiety disorders: Secondary | ICD-10-CM | POA: Diagnosis not present

## 2021-03-05 DIAGNOSIS — E119 Type 2 diabetes mellitus without complications: Secondary | ICD-10-CM

## 2021-03-05 MED ORDER — METFORMIN HCL ER 500 MG PO TB24: 1000.0000 mg | ORAL_TABLET | Freq: Every day | ORAL | 1 refills | Status: DC

## 2021-03-05 MED ORDER — CLONAZEPAM 0.5 MG PO TABS: 0.5000 mg | ORAL_TABLET | Freq: Two times a day (BID) | ORAL | 2 refills | Status: DC | PRN

## 2021-03-05 MED ORDER — ROSUVASTATIN CALCIUM 5 MG PO TABS: 5.0000 mg | ORAL_TABLET | Freq: Every day | ORAL | 1 refills | Status: DC

## 2021-03-05 MED ORDER — FLUOXETINE HCL 20 MG PO CAPS: ORAL_CAPSULE | ORAL | 1 refills | Status: DC

## 2021-03-05 NOTE — Progress Notes (Signed)
Virtual Visit via Telephone Note  I connected with Rebecca Barnes on 03/05/21 at  4:00 PM EDT by telephone and verified that I am speaking with the correct person using two identifiers.   I discussed the limitations, risks, security and privacy concerns of performing an evaluation and management service by telephone and the availability of in person appointments. I also discussed with the patient that there may be a patient responsible charge related to this service. The patient expressed understanding and agreed to proceed.  Location patient: home Location provider: work  Participants present for the call: patient, provider Patient did not have a visit in the prior 7 days to address this/these issue(s).  Chief Complaint  Patient presents with   Follow-up    Pt call for DM follow up and Med refill     History of Present Illness: Rebecca Barnes is a 75 y.o. female patient seen today for medication refills and f/u on HTN, DM, anxiety. For HTN, pt takes losartan 100mg  daily and norvasc 5mg  daily.    Past Medical History:  Diagnosis Date   Anxiety    Asthma    Diabetes mellitus without complication (HCC)    Hypertension    Stroke [facility])     Past Surgical History:  Procedure Laterality Date   arm fracture     arm surgery   CHOLECYSTECTOMY      Social History   Tobacco Use   Smoking status: Never   Smokeless tobacco: Never  Substance Use Topics   Alcohol use: Never   Drug use: Yes    Types: Marijuana    Comment: ocassionally    Family History  Problem Relation Age of Onset   Cancer Mother     Outpatient Encounter Medications as of 03/05/2021  Medication Sig   acetaminophen (TYLENOL) 325 MG tablet Take by mouth.   amLODipine (NORVASC) 5 MG tablet Take 1 tablet (5 mg total) by mouth daily.   aspirin 325 MG tablet Take by mouth.   losartan (COZAAR) 100 MG tablet Take 1 tablet (100 mg total) by mouth daily.   Multiple Vitamin (MULTI-VITAMIN) tablet Take by mouth.    [DISCONTINUED] clonazePAM (KLONOPIN) 0.5 MG tablet Take 1 tablet (0.5 mg total) by mouth 2 (two) times daily as needed for anxiety.   [DISCONTINUED] FLUoxetine (PROZAC) 20 MG capsule TAKE 1 CAPSULE(20 MG) BY MOUTH DAILY   [DISCONTINUED] metFORMIN (GLUCOPHAGE XR) 500 MG 24 hr tablet Take 2 tablets (1,000 mg total) by mouth daily with breakfast.   clonazePAM (KLONOPIN) 0.5 MG tablet Take 1 tablet (0.5 mg total) by mouth 2 (two) times daily as needed for anxiety.   FLUoxetine (PROZAC) 20 MG capsule TAKE 1 CAPSULE(20 MG) BY MOUTH DAILY   metFORMIN (GLUCOPHAGE XR) 500 MG 24 hr tablet Take 2 tablets (1,000 mg total) by mouth daily with breakfast.   rosuvastatin (CRESTOR) 5 MG tablet Take 1 tablet (5 mg total) by mouth daily.   [DISCONTINUED] atorvastatin (LIPITOR) 80 MG tablet Take by mouth. (Patient not taking: Reported on 03/05/2021)   [DISCONTINUED] bisacodyl (DULCOLAX) 10 MG suppository Place rectally. (Patient not taking: Reported on 03/05/2021)   [DISCONTINUED] busPIRone (BUSPAR) 5 MG tablet TAKE 1 TABLET(5 MG) BY MOUTH TWICE DAILY. (Patient not taking: Reported on 03/05/2021)   [DISCONTINUED] busPIRone (BUSPAR) 5 MG tablet Take 1 tablet by mouth daily. (Patient not taking: Reported on 03/05/2021)   [DISCONTINUED] rosuvastatin (CRESTOR) 5 MG tablet Take 1 tablet by mouth daily. (Patient not taking: Reported on 03/05/2021)   [DISCONTINUED]  senna-docusate (SENOKOT-S) 8.6-50 MG tablet Take by mouth. (Patient not taking: Reported on 03/05/2021)   No facility-administered encounter medications on file as of 03/05/2021.     Allergies  Allergen Reactions   Sitagliptin     Other reaction(s): DIZZINESS. (JANUVIA)   Sulfa Antibiotics     (SULFA)      ROS: See pertinent positives and negatives per HPI.   Observations/Objective:  Wt Readings from Last 3 Encounters:  06/27/20 259 lb 3.2 oz (117.6 kg)  05/09/20 263 lb 6.4 oz (119.5 kg)  05/05/20 268 lb (121.6 kg)   Temp Readings from Last 3  Encounters:  06/27/20 98.6 F (37 C) (Temporal)  05/09/20 (!) 96.8 F (36 C) (Temporal)  05/05/20 98.3 F (36.8 C)   BP Readings from Last 3 Encounters:  06/27/20 124/70  05/09/20 140/76  05/05/20 (!) 152/65   Pulse Readings from Last 3 Encounters:  06/27/20 75  05/09/20 (!) 105  05/05/20 (!) 107    Patient sounds well on the phone. I do not appreciate any SOB. Speech and thought processing are grossly intact. Patient reported vitals:  There were no vitals taken for this visit.   Assessment and Plan: 1. Anxiety with depression - overall controlled, stable - database reviewed and appropriate Refill: - FLUoxetine (PROZAC) 20 MG capsule; TAKE 1 CAPSULE(20 MG) BY MOUTH DAILY  Dispense: 90 capsule; Refill: 1 - clonazePAM (KLONOPIN) 0.5 MG tablet; Take 1 tablet (0.5 mg total) by mouth 2 (two) times daily as needed for anxiety.  Dispense: 60 tablet; Refill: 2 - needs UTD at next OV in 14mo  2. Type 2 diabetes mellitus without complication, without long-term current use of insulin (HCC) - overdue for A1C - last done in 05/2020 = 7.3 - Hemoglobin A1c; Future Refill: - metFORMIN (GLUCOPHAGE XR) 500 MG 24 hr tablet; Take 2 tablets (1,000 mg total) by mouth daily with breakfast.  Dispense: 180 tablet; Refill: 1 - rosuvastatin (CRESTOR) 5 MG tablet; Take 1 tablet (5 mg total) by mouth daily.  Dispense: 90 tablet; Refill: 1  3. Essential hypertension - controlled, at goal - cont losartan 100mg  daily and norvasc 5mg  daily - Basic metabolic panel; Future - CBC; Future  Lab appt in coming weeks then in-person f/u in 3 mo  I did not refer this patient for an OV in the next 24 hours for this/these issue(s).  I discussed the assessment and treatment plan with the patient. The patient was provided an opportunity to ask questions and all were answered. The patient agreed with the plan and demonstrated an understanding of the instructions.   The patient was advised to call back or seek  an in-person evaluation if the symptoms worsen or if the condition fails to improve as anticipated.  I provided 16 minutes of non-face-to-face time during this encounter.   , DO

## 2021-03-11 ENCOUNTER — Telehealth: Payer: Self-pay | Admitting: Family Medicine

## 2021-03-11 NOTE — Telephone Encounter (Signed)
Left message for patient to call back and schedule Medicare Annual Wellness Visit (AWV).  Please offer to do virtually or by telephone.   Last AWV:02/13/2020  Please schedule at anytime with Nurse Health Advisor.

## 2021-03-14 ENCOUNTER — Inpatient Hospital Stay (HOSPITAL_COMMUNITY)
Admission: EM | Admit: 2021-03-14 | Discharge: 2021-03-18 | DRG: 641 | Disposition: A | Discharge status: Skilled Nursing Facility | Payer: Medicare Other | Source: Skilled Nursing Facility | Attending: Internal Medicine | Admitting: Internal Medicine

## 2021-03-14 ENCOUNTER — Other Ambulatory Visit: Payer: Self-pay

## 2021-03-14 ENCOUNTER — Encounter (HOSPITAL_COMMUNITY): Payer: Self-pay | Admitting: Emergency Medicine

## 2021-03-14 ENCOUNTER — Emergency Department (HOSPITAL_COMMUNITY): Payer: Medicare Other

## 2021-03-14 DIAGNOSIS — E871 Hypo-osmolality and hyponatremia: Principal | ICD-10-CM

## 2021-03-14 DIAGNOSIS — W19XXXA Unspecified fall, initial encounter: Secondary | ICD-10-CM | POA: Diagnosis present

## 2021-03-14 DIAGNOSIS — D75839 Thrombocytosis, unspecified: Secondary | ICD-10-CM | POA: Diagnosis present

## 2021-03-14 DIAGNOSIS — N179 Acute kidney failure, unspecified: Secondary | ICD-10-CM | POA: Diagnosis present

## 2021-03-14 DIAGNOSIS — J45909 Unspecified asthma, uncomplicated: Secondary | ICD-10-CM | POA: Diagnosis present

## 2021-03-14 DIAGNOSIS — Z6841 Body Mass Index (BMI) 40.0 and over, adult: Secondary | ICD-10-CM

## 2021-03-14 DIAGNOSIS — E861 Hypovolemia: Secondary | ICD-10-CM | POA: Diagnosis present

## 2021-03-14 DIAGNOSIS — F418 Other specified anxiety disorders: Secondary | ICD-10-CM

## 2021-03-14 DIAGNOSIS — Z79899 Other long term (current) drug therapy: Secondary | ICD-10-CM

## 2021-03-14 DIAGNOSIS — Z7982 Long term (current) use of aspirin: Secondary | ICD-10-CM

## 2021-03-14 DIAGNOSIS — F32A Depression, unspecified: Secondary | ICD-10-CM | POA: Diagnosis present

## 2021-03-14 DIAGNOSIS — E119 Type 2 diabetes mellitus without complications: Secondary | ICD-10-CM | POA: Diagnosis present

## 2021-03-14 DIAGNOSIS — I10 Essential (primary) hypertension: Secondary | ICD-10-CM | POA: Diagnosis present

## 2021-03-14 DIAGNOSIS — Z9049 Acquired absence of other specified parts of digestive tract: Secondary | ICD-10-CM

## 2021-03-14 DIAGNOSIS — R531 Weakness: Secondary | ICD-10-CM

## 2021-03-14 DIAGNOSIS — E878 Other disorders of electrolyte and fluid balance, not elsewhere classified: Secondary | ICD-10-CM | POA: Diagnosis present

## 2021-03-14 DIAGNOSIS — Z20822 Contact with and (suspected) exposure to covid-19: Secondary | ICD-10-CM | POA: Diagnosis present

## 2021-03-14 DIAGNOSIS — F419 Anxiety disorder, unspecified: Secondary | ICD-10-CM | POA: Diagnosis present

## 2021-03-14 DIAGNOSIS — M25571 Pain in right ankle and joints of right foot: Secondary | ICD-10-CM | POA: Diagnosis present

## 2021-03-14 DIAGNOSIS — Z8249 Family history of ischemic heart disease and other diseases of the circulatory system: Secondary | ICD-10-CM

## 2021-03-14 DIAGNOSIS — E876 Hypokalemia: Secondary | ICD-10-CM | POA: Diagnosis present

## 2021-03-14 DIAGNOSIS — Z833 Family history of diabetes mellitus: Secondary | ICD-10-CM

## 2021-03-14 DIAGNOSIS — I6930 Unspecified sequelae of cerebral infarction: Secondary | ICD-10-CM

## 2021-03-14 DIAGNOSIS — E860 Dehydration: Secondary | ICD-10-CM | POA: Diagnosis present

## 2021-03-14 DIAGNOSIS — Y92009 Unspecified place in unspecified non-institutional (private) residence as the place of occurrence of the external cause: Secondary | ICD-10-CM

## 2021-03-14 DIAGNOSIS — I69341 Monoplegia of lower limb following cerebral infarction affecting right dominant side: Secondary | ICD-10-CM

## 2021-03-14 DIAGNOSIS — E785 Hyperlipidemia, unspecified: Secondary | ICD-10-CM | POA: Diagnosis present

## 2021-03-14 DIAGNOSIS — Z7984 Long term (current) use of oral hypoglycemic drugs: Secondary | ICD-10-CM

## 2021-03-14 DIAGNOSIS — E669 Obesity, unspecified: Secondary | ICD-10-CM | POA: Diagnosis present

## 2021-03-14 DIAGNOSIS — R519 Headache, unspecified: Secondary | ICD-10-CM | POA: Diagnosis present

## 2021-03-14 DIAGNOSIS — D751 Secondary polycythemia: Secondary | ICD-10-CM | POA: Diagnosis present

## 2021-03-14 LAB — RESP PANEL BY RT-PCR (FLU A&B, COVID) ARPGX2
Influenza A by PCR: NEGATIVE
Influenza B by PCR: NEGATIVE
SARS Coronavirus 2 by RT PCR: NEGATIVE

## 2021-03-14 LAB — DIFFERENTIAL
Abs Immature Granulocytes: 0.03 10*3/uL (ref 0.00–0.07)
Basophils Absolute: 0 10*3/uL (ref 0.0–0.1)
Basophils Relative: 0 %
Eosinophils Absolute: 0.1 10*3/uL (ref 0.0–0.5)
Eosinophils Relative: 1 %
Immature Granulocytes: 0 %
Lymphocytes Relative: 27 %
Lymphs Abs: 2.8 10*3/uL (ref 0.7–4.0)
Monocytes Absolute: 1.2 10*3/uL — ABNORMAL HIGH (ref 0.1–1.0)
Monocytes Relative: 11 %
Neutro Abs: 6.2 10*3/uL (ref 1.7–7.7)
Neutrophils Relative %: 61 %

## 2021-03-14 LAB — CBC
HCT: 44.5 % (ref 36.0–46.0)
Hemoglobin: 15.3 g/dL — ABNORMAL HIGH (ref 12.0–15.0)
MCH: 27.5 pg (ref 26.0–34.0)
MCHC: 34.4 g/dL (ref 30.0–36.0)
MCV: 79.9 fL — ABNORMAL LOW (ref 80.0–100.0)
Platelets: 448 10*3/uL — ABNORMAL HIGH (ref 150–400)
RBC: 5.57 MIL/uL — ABNORMAL HIGH (ref 3.87–5.11)
RDW: 13.3 % (ref 11.5–15.5)
WBC: 10.3 10*3/uL (ref 4.0–10.5)
nRBC: 0 % (ref 0.0–0.2)

## 2021-03-14 LAB — COMPREHENSIVE METABOLIC PANEL
ALT: 17 U/L (ref 0–44)
AST: 24 U/L (ref 15–41)
Albumin: 4.1 g/dL (ref 3.5–5.0)
Alkaline Phosphatase: 76 U/L (ref 38–126)
Anion gap: 14 (ref 5–15)
BUN: 15 mg/dL (ref 8–23)
CO2: 22 mmol/L (ref 22–32)
Calcium: 9.7 mg/dL (ref 8.9–10.3)
Chloride: 86 mmol/L — ABNORMAL LOW (ref 98–111)
Creatinine, Ser: 1.05 mg/dL — ABNORMAL HIGH (ref 0.44–1.00)
GFR, Estimated: 56 mL/min — ABNORMAL LOW (ref >60)
Glucose, Bld: 149 mg/dL — ABNORMAL HIGH (ref 70–99)
Potassium: 3.3 mmol/L — ABNORMAL LOW (ref 3.5–5.1)
Sodium: 122 mmol/L — ABNORMAL LOW (ref 135–145)
Total Bilirubin: 0.9 mg/dL (ref 0.3–1.2)
Total Protein: 7.6 g/dL (ref 6.5–8.1)

## 2021-03-14 LAB — I-STAT VENOUS BLOOD GAS, ED
Acid-Base Excess: 0 mmol/L (ref 0.0–2.0)
Bicarbonate: 24.2 mmol/L (ref 20.0–28.0)
Calcium, Ion: 1.16 mmol/L (ref 1.15–1.40)
HCT: 49 % — ABNORMAL HIGH (ref 36.0–46.0)
Hemoglobin: 16.7 g/dL — ABNORMAL HIGH (ref 12.0–15.0)
O2 Saturation: 77 %
Potassium: 3.5 mmol/L (ref 3.5–5.1)
Sodium: 122 mmol/L — ABNORMAL LOW (ref 135–145)
TCO2: 25 mmol/L (ref 22–32)
pCO2, Ven: 37.1 mmHg — ABNORMAL LOW (ref 44.0–60.0)
pH, Ven: 7.421 (ref 7.250–7.430)
pO2, Ven: 40 mmHg (ref 32.0–45.0)

## 2021-03-14 LAB — PROTIME-INR
INR: 1 (ref 0.8–1.2)
Prothrombin Time: 13.3 seconds (ref 11.4–15.2)

## 2021-03-14 LAB — APTT: aPTT: 32 seconds (ref 24–36)

## 2021-03-14 LAB — GLUCOSE, CAPILLARY: Glucose-Capillary: 166 mg/dL — ABNORMAL HIGH (ref 70–99)

## 2021-03-14 IMAGING — CT CT HEAD W/O CM
4 series · 17 of 47 positions shown, 19 images · non-contrast
Comparison: [DATE].

CLINICAL DATA: Syncope, fall.

EXAM:
CT HEAD WITHOUT CONTRAST
TECHNIQUE: Contiguous axial images were obtained from the base of the skull
through the vertex without intravenous contrast.

[Series 3: head without · axial · non-contrast · 0.44mm/px · z∈[-98,+42]mm · 7 of 38 slices shown, 9 images]
[im 5/38  brain]
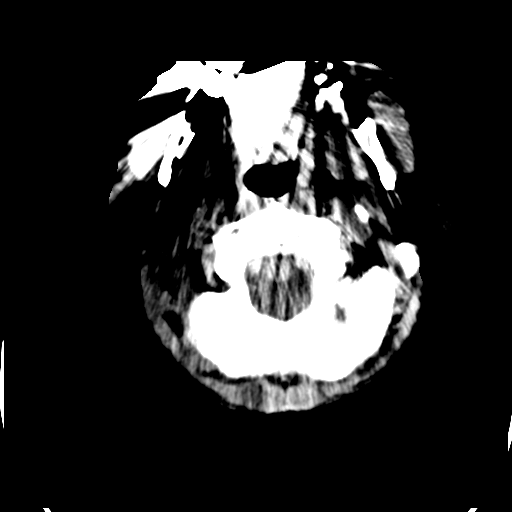
[im 5/38  bone]
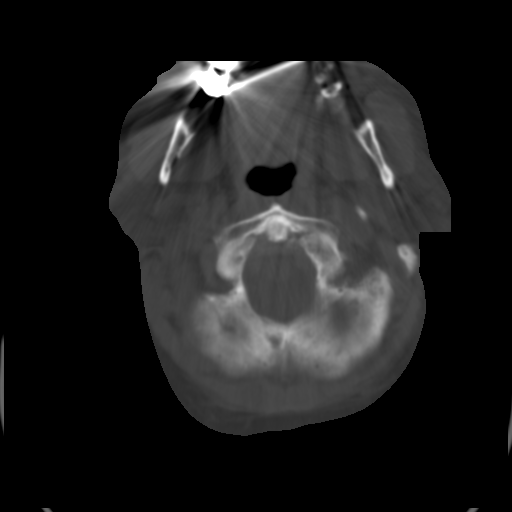
[im 10/38  brain]
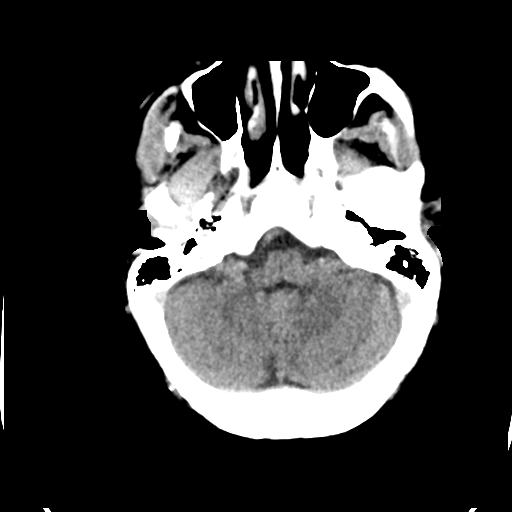
[im 14/38  brain]
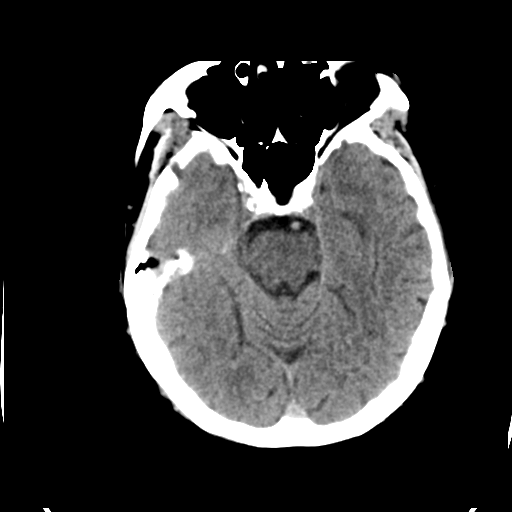
[im 19/38  brain]
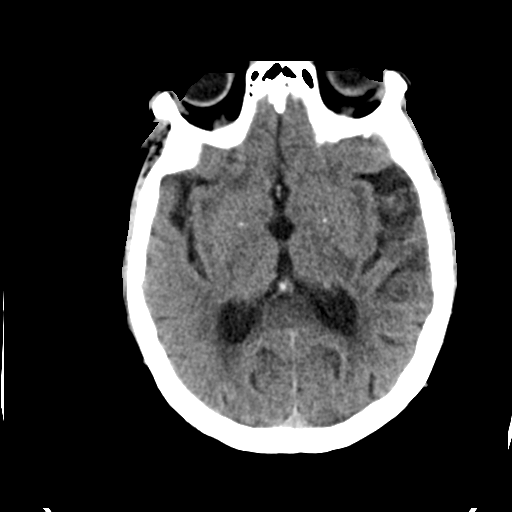
[im 24/38  brain]
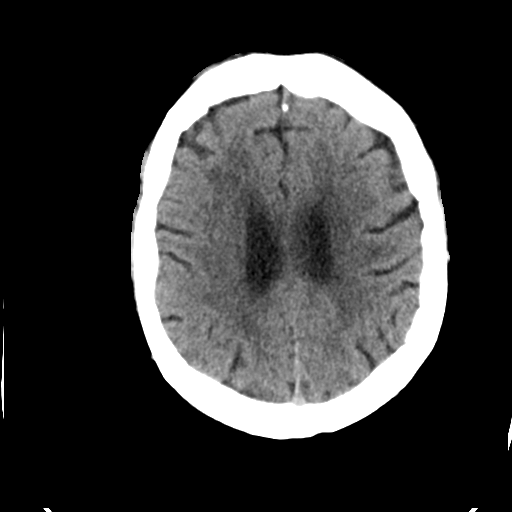
[im 24/38  bone]
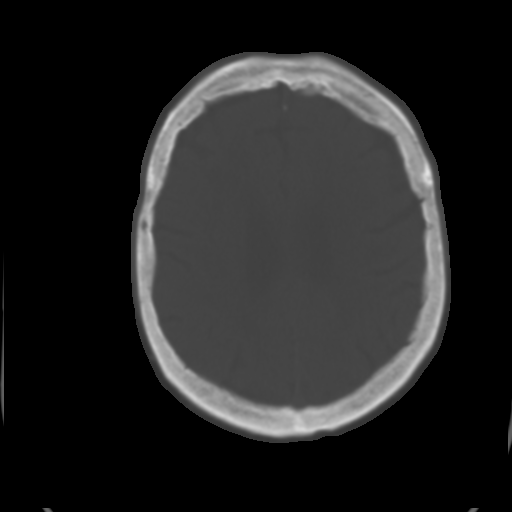
[im 28/38  brain]
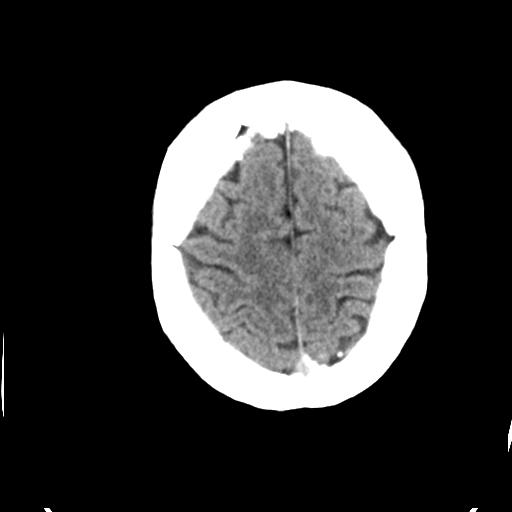
[im 33/38  brain]
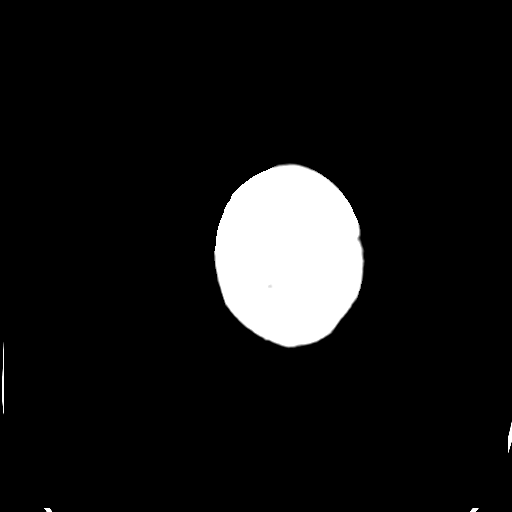

[Series 4: head bone · axial · 0.44mm/px · z∈[-100,-36]mm · 4 of 94 slices shown]
[im 10/94  bone]
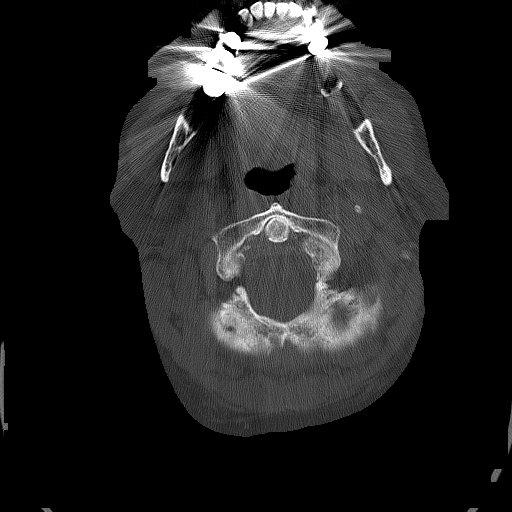
[im 19/94  bone]
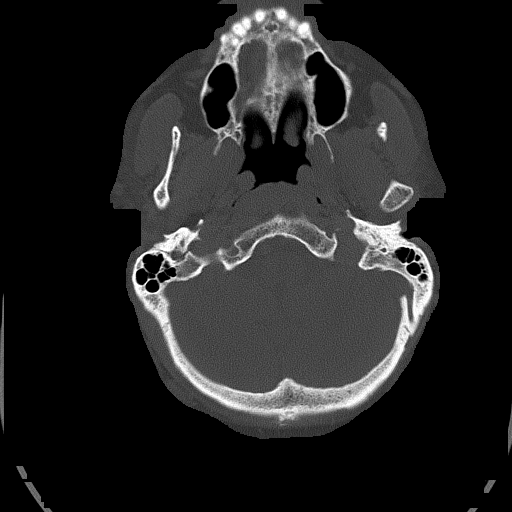
[im 28/94  bone]
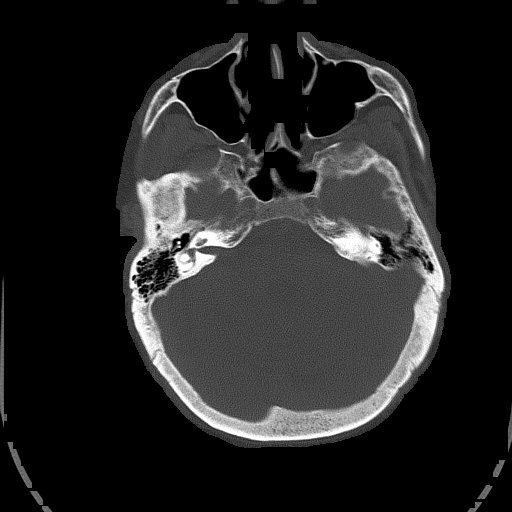
[im 42/94  bone]
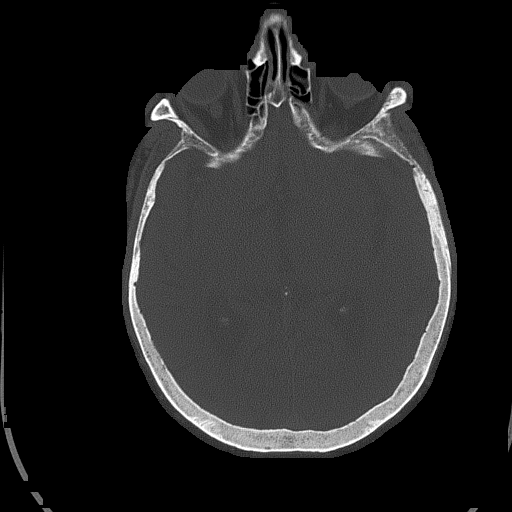

[Series 5: head without cor · coronal · non-contrast · 0.36mm/px · 3 of 67 slices shown]
[im 26/67  brain]
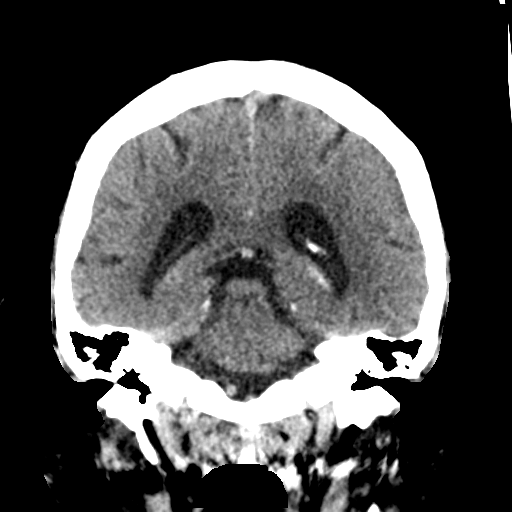
[im 31/67  brain]
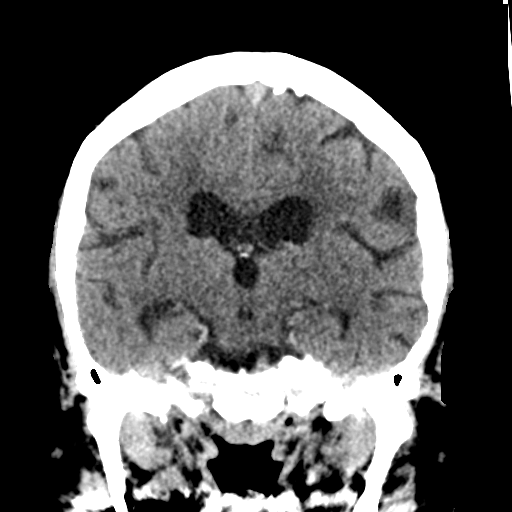
[im 36/67  brain]
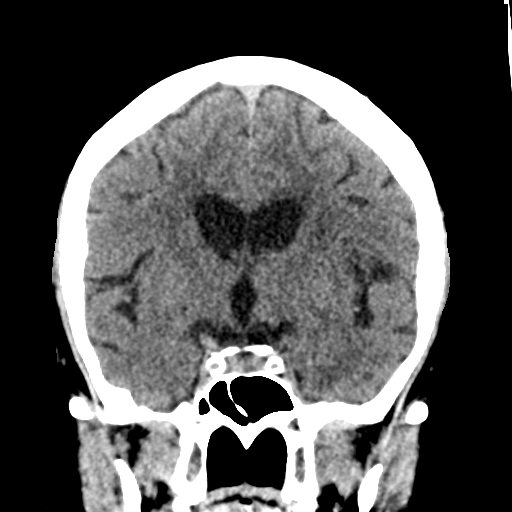

[Series 6: head without sag · sagittal · non-contrast · 0.36mm/px · 3 of 57 slices shown]
[im 19/57  brain]
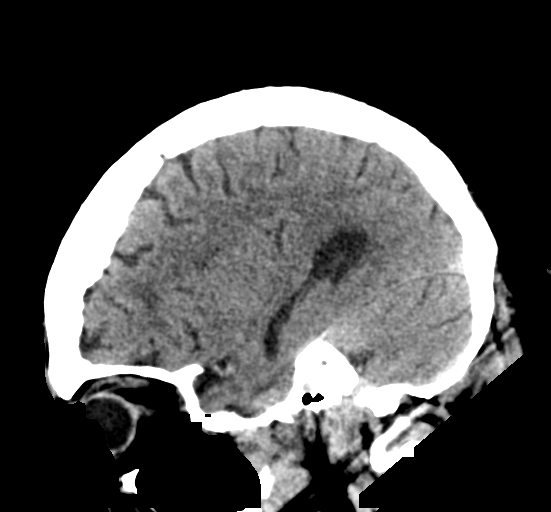
[im 29/57  brain]
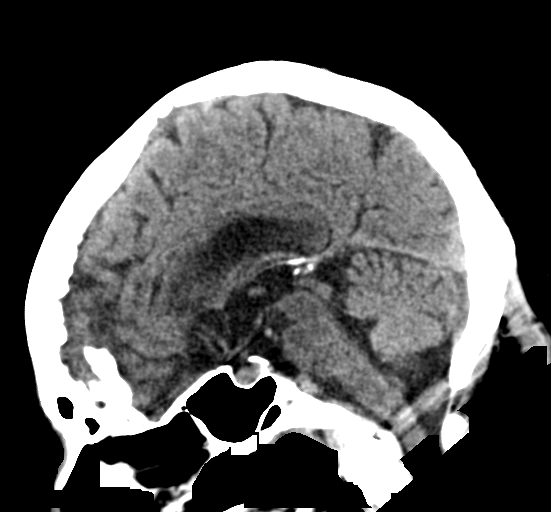
[im 38/57  brain]
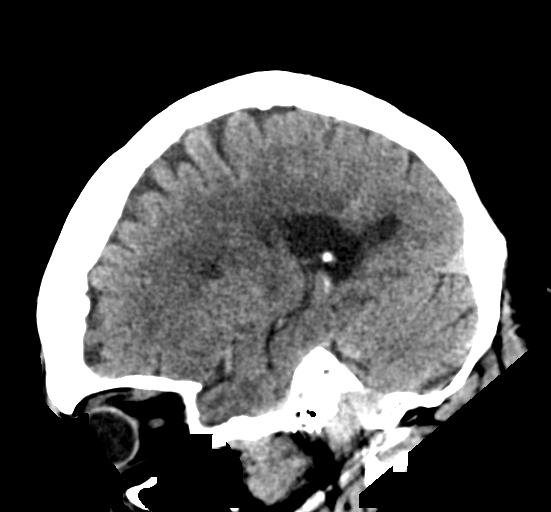

[17 of 47 positions shown; findings below may reference images not displayed]

FINDINGS: Brain: Mild chronic ischemic white matter disease is noted. No mass
effect or midline shift is noted. Ventricular size is within normal
limits. There is no evidence of mass lesion, hemorrhage or acute
infarction.

Vascular: No hyperdense vessel or unexpected calcification.

Skull: Normal. Negative for fracture or focal lesion.

Sinuses/Orbits: No acute finding.

Other: None.
IMPRESSION: No acute intracranial abnormality seen.

## 2021-03-14 MED ORDER — CLONAZEPAM 0.5 MG PO TABS
0.5000 mg | ORAL_TABLET | Freq: Two times a day (BID) | ORAL | Status: DC | PRN
  Administered 2021-03-14 – 2021-03-16 (×3): 0.5 mg via ORAL
  Filled 2021-03-14 (×4): qty 1

## 2021-03-14 MED ORDER — ACETAMINOPHEN 650 MG RE SUPP: 650.0000 mg | Freq: Four times a day (QID) | RECTAL | Status: DC | PRN

## 2021-03-14 MED ORDER — METFORMIN HCL ER 500 MG PO TB24
1000.0000 mg | ORAL_TABLET | Freq: Every day | ORAL | Status: DC
  Administered 2021-03-15 – 2021-03-18 (×4): 1000 mg via ORAL
  Filled 2021-03-14 (×4): qty 2

## 2021-03-14 MED ORDER — INSULIN ASPART 100 UNIT/ML IJ SOLN
0.0000 [IU] | Freq: Three times a day (TID) | INTRAMUSCULAR | Status: DC
  Administered 2021-03-15: 2 [IU] via SUBCUTANEOUS
  Administered 2021-03-15: 3 [IU] via SUBCUTANEOUS
  Administered 2021-03-15 – 2021-03-18 (×6): 2 [IU] via SUBCUTANEOUS

## 2021-03-14 MED ORDER — SODIUM CHLORIDE 0.9 % IV BOLUS: 1000.0000 mL | Freq: Once | INTRAVENOUS | Status: DC

## 2021-03-14 MED ORDER — SODIUM CHLORIDE 0.9 % IV SOLN: INTRAVENOUS | Status: DC

## 2021-03-14 MED ORDER — LORAZEPAM 2 MG/ML IJ SOLN
1.0000 mg | Freq: Once | INTRAMUSCULAR | Status: AC
  Administered 2021-03-14: 1 mg via INTRAVENOUS
  Filled 2021-03-14: qty 1

## 2021-03-14 MED ORDER — POTASSIUM CHLORIDE IN NACL 20-0.9 MEQ/L-% IV SOLN
INTRAVENOUS | Status: DC
  Filled 2021-03-14 (×3): qty 1000

## 2021-03-14 MED ORDER — ENOXAPARIN SODIUM 40 MG/0.4ML IJ SOSY
40.0000 mg | PREFILLED_SYRINGE | INTRAMUSCULAR | Status: DC
Start: 2021-03-14 — End: 2021-03-16
  Administered 2021-03-14 – 2021-03-15 (×2): 40 mg via SUBCUTANEOUS
  Filled 2021-03-14 (×2): qty 0.4

## 2021-03-14 MED ORDER — POTASSIUM CHLORIDE CRYS ER 20 MEQ PO TBCR
40.0000 meq | EXTENDED_RELEASE_TABLET | Freq: Once | ORAL | Status: AC
  Administered 2021-03-14: 40 meq via ORAL
  Filled 2021-03-14: qty 2

## 2021-03-14 MED ORDER — ASPIRIN EC 81 MG PO TBEC
81.0000 mg | DELAYED_RELEASE_TABLET | Freq: Every day | ORAL | Status: DC
  Administered 2021-03-15 – 2021-03-18 (×4): 81 mg via ORAL
  Filled 2021-03-14 (×4): qty 1

## 2021-03-14 MED ORDER — ACETAMINOPHEN 325 MG PO TABS
650.0000 mg | ORAL_TABLET | Freq: Four times a day (QID) | ORAL | Status: DC | PRN
  Administered 2021-03-14 – 2021-03-17 (×5): 650 mg via ORAL
  Filled 2021-03-14 (×5): qty 2

## 2021-03-14 MED ORDER — AMLODIPINE BESYLATE 5 MG PO TABS
5.0000 mg | ORAL_TABLET | Freq: Every day | ORAL | Status: DC
  Administered 2021-03-15 – 2021-03-18 (×4): 5 mg via ORAL
  Filled 2021-03-14 (×4): qty 1

## 2021-03-14 MED ORDER — FLUOXETINE HCL 20 MG PO CAPS
20.0000 mg | ORAL_CAPSULE | Freq: Every day | ORAL | Status: DC
  Administered 2021-03-15 – 2021-03-18 (×4): 20 mg via ORAL
  Filled 2021-03-14 (×4): qty 1

## 2021-03-14 MED ORDER — ROSUVASTATIN CALCIUM 5 MG PO TABS
5.0000 mg | ORAL_TABLET | Freq: Every evening | ORAL | Status: DC
  Administered 2021-03-15 – 2021-03-17 (×3): 5 mg via ORAL
  Filled 2021-03-14 (×3): qty 1

## 2021-03-14 NOTE — Progress Notes (Signed)
Patient arrived from ED, A/Ox4 shared that she is scared of falling. VSS. TELE applied and confirmed. CBG 166. Safety precautions and orders reviewed. Bed alarm activated. Will continue to monitor.

## 2021-03-14 NOTE — ED Triage Notes (Signed)
Pt from [facility] after a fall 11 today. Pt became dizzy, lost balance and fell on carpet. Hx of CVA with right leg deficits. Daughter reports pt is anxious and acting similar to previous strokes.

## 2021-03-14 NOTE — ED Provider Notes (Signed)
Emergency Medicine Provider Triage Evaluation Note  Rebecca Barnes , a 75 y.o. female  was evaluated in triage.  Pt complains of a fall that occurred around 11 AM this morning.  Patient denies any dizziness.  She states that she was experiencing "her vision going white" while walking on a carpeted surface.  She became lightheaded and fell on the carpet.  History of CVA with chronic right leg deficits.  Does not feel that her weakness is worsening.  Denies any other areas of acute weakness.  Patient does note a mild headache.  No chest pain or shortness of breath.  Patient reports anxiety regarding her symptoms.  Her daughter is concerned that patient might be acting similar to her previous CVAs.  Her daughter does note that she was complaining of pain to her side over the past couple of days as well as urinary frequency.  Physical Exam  BP (!) 108/54 (BP Location: Right Arm)   Pulse (!) 114   Temp 97.8 F (36.6 C) (Oral)   Resp 16   Ht 5\' 5"  (1.651 m)   Wt 120 kg   SpO2 94%   BMI 44.02 kg/m  Gen:   Awake, no distress   Resp:  Normal effort  MSK:   Moves extremities without difficulty  Other:  Strength 4/5 in the right leg.  Strength 5/5 in both arms and the left leg.  No obvious facial droop.  Extraocular movements are intact.  No arm drift.  Medical Decision Making  Medically screening exam initiated at 5:12 PM.  Appropriate orders placed.  Rebecca Barnes was informed that the remainder of the evaluation will be completed by another provider, this initial triage assessment does not replace that evaluation, and the importance of remaining in the ED until their evaluation is complete.  Patient outside of the tPA window.  Exam not consistent with LVO.  Will obtain stroke work-up.  CT scan of the head.   Rebecca Ravel, PA-C 03/14/21 1714    03/16/21, MD 03/15/21 1109

## 2021-03-14 NOTE — ED Provider Notes (Signed)
[facility] EMERGENCY DEPARTMENT Provider Note   CSN: [id number] Arrival date & time: 03/14/21  1617     History Chief Complaint  Patient presents with   Rebecca Barnes is a 75 y.o. female.  Rebecca Barnes is a 23 YOF accompanied by daughter with h/o prior CVA, DM, HTN, and anxiety that is presenting today for recent fall from standing due to dizziness. No prior exertion or abnormal activity. Pt describes walking at her ALF and suddenly feeling dizzy with "everything whiting out." Pt endorses hitting her head but denies LOC. Pt denies any acute muscle weakness, changes in sensation but endorses transient changes in speech. She also reports just not feeling well over the last few days.  Additionally daughter makes note that following pt's fall pt was confused outside of pt's baseline. PT denies SOB, chest pain, N/V, changes in BM, fever, night sweats, and urinary symptoms.  BM's normal.   Pt had a CVA last year in 2021 resulting in residual right leg weakness. Pt takes amlodipine for HTN, and fluoxetine for anxiety. Only medication changes was addition of statin this recent Monday. PT has been taking in food appropriately but does endorse drinking 1-1.5L of water a day but can't specify how long she has been drinking that much.  She has been eating but low sodium meals and No Alcohol consumption.   The history is provided by the patient and a relative.  Fall This is a recurrent problem. The current episode started 6 to 12 hours ago. The problem occurs rarely. The problem has not changed since onset.Nothing aggravates the symptoms. Nothing relieves the symptoms.      Past Medical History:  Diagnosis Date   Anxiety    Asthma    Diabetes mellitus without complication (HCC)    Hypertension    Stroke [facility])     Patient Active Problem List   Diagnosis Date Noted   Essential hypertension 09/18/2019   Anxiety with depression 06/07/2019   Type 2 diabetes mellitus  without complication, without long-term current use of insulin (HCC) 06/07/2019   Disturbance of skin sensation 06/07/2019   Leukocytosis 06/07/2019   T2DM (type 2 diabetes mellitus) (HCC) 06/07/2019   Generalized weakness 06/07/2019   ANXIETY 03/29/2007    Past Surgical History:  Procedure Laterality Date   arm fracture     arm surgery   CHOLECYSTECTOMY       OB History     Gravida  1   Para      Term      Preterm      AB      Living  1      SAB      IAB      Ectopic      Multiple      Live Births              Family History  Problem Relation Age of Onset   Cancer Mother     Social History   Tobacco Use   Smoking status: Never   Smokeless tobacco: Never  Substance Use Topics   Alcohol use: Never   Drug use: Yes    Types: Marijuana    Comment: ocassionally    Home Medications Prior to Admission medications   Medication Sig Start Date End Date Taking? Authorizing Provider  acetaminophen (TYLENOL) 325 MG tablet Take by mouth. 06/13/20   [provider]  amLODipine (NORVASC) 5 MG tablet Take 1 tablet (5  mg total) by mouth daily. 04/23/20   Cirigliano, Jearld LeschMary K, DO  aspirin 325 MG tablet Take by mouth. 06/19/20   [provider]  clonazePAM (KLONOPIN) 0.5 MG tablet Take 1 tablet (0.5 mg total) by mouth 2 (two) times daily as needed for anxiety. 03/05/21   Cirigliano, Mary K, DO  FLUoxetine (PROZAC) 20 MG capsule TAKE 1 CAPSULE(20 MG) BY MOUTH DAILY 03/05/21   Cirigliano, Jearld LeschMary K, DO  losartan (COZAAR) 100 MG tablet Take 1 tablet (100 mg total) by mouth daily. 04/23/20   Cirigliano, Jearld LeschMary K, DO  metFORMIN (GLUCOPHAGE XR) 500 MG 24 hr tablet Take 2 tablets (1,000 mg total) by mouth daily with breakfast. 03/05/21   Overton Mamirigliano, Mary K, DO  Multiple Vitamin (MULTI-VITAMIN) tablet Take by mouth.    [provider]  rosuvastatin (CRESTOR) 5 MG tablet Take 1 tablet (5 mg total) by mouth daily. 03/05/21   Cirigliano, Jearld LeschMary K, DO     Allergies    Sitagliptin and Sulfa antibiotics  Review of Systems   Review of Systems  Constitutional: Negative.   HENT: Negative.    Eyes: Negative.   Respiratory: Negative.    Cardiovascular: Negative.   Gastrointestinal: Negative.   Endocrine: Negative.   Genitourinary: Negative.   Musculoskeletal: Negative.   Skin: Negative.   Neurological:        Right leg weakness 3/5 other extremities 5/5  All other systems reviewed and are negative.  Physical Exam Updated Vital Signs BP 105/79   Pulse (!) 107   Temp 97.8 F (36.6 C) (Oral)   Resp 17   Ht 5\' 5"  (1.651 m)   Wt 120 kg   SpO2 96%   BMI 44.02 kg/m   Physical Exam Vitals and nursing note reviewed.  Constitutional:      General: She is not in acute distress.    Appearance: Normal appearance. She is well-developed.  HENT:     Head: Normocephalic and atraumatic.  Eyes:     General: No visual field deficit.    Pupils: Pupils are equal, round, and reactive to light.  Cardiovascular:     Rate and Rhythm: Regular rhythm. Tachycardia present.     Pulses: Normal pulses.     Heart sounds: Normal heart sounds. No murmur heard.   No friction rub.  Pulmonary:     Effort: Pulmonary effort is normal.     Breath sounds: Normal breath sounds. No wheezing or rales.  Abdominal:     General: Bowel sounds are normal. There is no distension.     Palpations: Abdomen is soft.     Tenderness: There is no abdominal tenderness. There is no guarding or rebound.  Musculoskeletal:        General: No tenderness. Normal range of motion.     Cervical back: Normal range of motion.     Right lower leg: No edema.     Left lower leg: No edema.     Comments: RIght leg 3/5 Strength. No pedal edema.  Skin:    General: Skin is warm and dry.     Capillary Refill: Capillary refill takes less than 2 seconds.     Findings: No rash.  Neurological:     Mental Status: She is alert and oriented to person, place, and time. Mental status is at  baseline.     Cranial Nerves: No cranial nerve deficit, dysarthria or facial asymmetry.     Sensory: Sensation is intact. No sensory deficit.     Motor: Motor  function is intact. No weakness or pronator drift.     Coordination: Coordination normal.     Gait: Gait is intact.  Psychiatric:        Mood and Affect: Mood normal.        Behavior: Behavior normal.     Comments: Mildly anxious and agitated    ED Results / Procedures / Treatments   Labs (all labs ordered are listed, but only abnormal results are displayed) Labs Reviewed  CBC - Abnormal; Notable for the following components:      Result Value   RBC 5.57 (*)    Hemoglobin 15.3 (*)    MCV 79.9 (*)    Platelets 448 (*)    All other components within normal limits  DIFFERENTIAL - Abnormal; Notable for the following components:   Monocytes Absolute 1.2 (*)    All other components within normal limits  COMPREHENSIVE METABOLIC PANEL - Abnormal; Notable for the following components:   Sodium 122 (*)    Potassium 3.3 (*)    Chloride 86 (*)    Glucose, Bld 149 (*)    Creatinine, Ser 1.05 (*)    GFR, Estimated 56 (*)    All other components within normal limits  I-STAT VENOUS BLOOD GAS, ED - Abnormal; Notable for the following components:   pCO2, Ven 37.1 (*)    Sodium 122 (*)    HCT 49.0 (*)    Hemoglobin 16.7 (*)    All other components within normal limits  RESP PANEL BY RT-PCR (FLU A&B, COVID) ARPGX2  PROTIME-INR  APTT  ETHANOL  RAPID URINE DRUG SCREEN, HOSP PERFORMED  URINALYSIS, ROUTINE W REFLEX MICROSCOPIC  OSMOLALITY, URINE  I-STAT CHEM 8, ED    EKG EKG Interpretation  Date/Time:  Saturday March 14 2021 18:56:49 EDT Ventricular Rate:  109 PR Interval:  175 QRS Duration: 93 QT Interval:  343 QTC Calculation: 462 R Axis:   -37 Text Interpretation: Sinus tachycardia Consider right atrial enlargement Inferior infarct, old No significant change since last tracing Confirmed by Gwyneth Sprout ([id number]) on  03/14/2021 7:19:19 PM  Radiology CT Head Wo Contrast  Result Date: 03/14/2021 CLINICAL DATA:  Syncope, fall. EXAM: CT HEAD WITHOUT CONTRAST TECHNIQUE: Contiguous axial images were obtained from the base of the skull through the vertex without intravenous contrast. COMPARISON:  May 05, 2020. FINDINGS: Brain: Mild chronic ischemic white matter disease is noted. No mass effect or midline shift is noted. Ventricular size is within normal limits. There is no evidence of mass lesion, hemorrhage or acute infarction. Vascular: No hyperdense vessel or unexpected calcification. Skull: Normal. Negative for fracture or focal lesion. Sinuses/Orbits: No acute finding. Other: None. IMPRESSION: No acute intracranial abnormality seen. Electronically Signed   By: Lupita Raider M.D.   On: 03/14/2021 18:28    Procedures Procedures   Medications Ordered in ED Medications  LORazepam (ATIVAN) injection 1 mg (has no administration in time range)  0.9 %  sodium chloride infusion (has no administration in time range)    ED Course  I have reviewed the triage vital signs and the nursing notes.  Pertinent labs & imaging results that were available during my care of the patient were reviewed by me and considered in my medical decision making (see chart for details).    MDM Rules/Calculators/A&P                           Rebecca Barnes is a 75 YOF well-appearing  with h/o CVA, HTN, DM, and anxiety that is being admitted to the hospital in stable condition for the workup and management of dizziness resulting in fall from standing likely due to hyponatremia. EKG was reassuring for sinus rhythm. CT w/o contrast ruled out CVA but pt refusing MRI at this time. CBC in ED was unremarkable for infection. CMP revealed hypotonic hyponatremia with Ns of 122 additional. Given lab findings, euvolemic status, h/o CVA and fluoxetine use SIADH vs. Primary polydipsia as the cause for hypotonic hyponatremia is difficult. Further  urine studies will be collected to determine SIADH vs. Primary Polydipsia. Maintenance fluids will be initiated.  Dizziness: -EKG - HEad ct w/o contrast wnl - VBG wnl - CBC wnl - CMP (hypokalemia, hypochloremia with increased creatinine 7/22) - U-tox - REsp Panel neg   Hyponatremia: - Urinalysis - mIVF 167mL/hr - Admit to floor - Urine Osmolality - UA - Creatinine  Anxiety - Ativan prn  Headache: Due to stress of today's events - Acetaminophen 650mg  prn  MDM   Amount and/or Complexity of Data Reviewed Clinical lab tests: ordered and reviewed Tests in the radiology section of CPT: ordered and reviewed Tests in the medicine section of CPT: ordered and reviewed Obtain history from someone other than the patient: yes Review and summarize past medical records: yes Discuss the patient with other providers: yes Independent visualization of images, tracings, or specimens: yes  Risk of Complications, Morbidity, and/or Mortality Presenting problems: moderate Diagnostic procedures: moderate Management options: moderate  Patient Progress Patient progress: stable    Final Clinical Impression(s) / ED Diagnoses Final diagnoses:  Hyponatremia  Fall in home, initial encounter    Rx / DC Orders ED Discharge Orders     None        , MD 03/14/21 2204

## 2021-03-14 NOTE — H&P (Signed)
History and Physical    Rebecca Barnes FIE:[id number] DOB: 1946-02-07 DOA: 03/14/2021  PCP: [name], DO   Patient coming from: Assisted living facility.  I have personally briefly reviewed patient's old medical records in [facility] Link  Chief Complaint: Dizziness and fall.  HPI: Rebecca Barnes is a 75 y.o. female with medical history significant of anxiety, asthma, type II DM, hypertension, history of other nonhemorrhagic CVA with residual RLE weakness who is brought to the emergency department due to having dizziness this morning associated with vision changes (stated earlier "everything whiting out") resulting in a partially  averted fall while she was at her assisted living complex.  She was diaphoretic for a few minutes, but stated that she had eaten oatmeal earlier.  She denied dyspnea, chest pain, palpitations, recent PND, orthopnea or pitting edema of the lower extremities.  No abdominal pain, nausea, emesis, constipation, melena or hematochezia.  Few days ago, she had 2 episodes of loose stools, that has since then resolved.  She has also felt some dysuria recently, but denied frequency, hematuria or flank pain.  No polyuria, polydipsia, polyphagia or blurred vision.  Her hemoglobin A1c was 7.4% earlier today.   While complain to her the results of the work-up, she denied using diuretics or Potomania.  Last year and late September and early October, she also had a similar picture although her sodium levels were higher.  ED Course: Initial vital signs were temperature 97.8 F, pulse 114, respirations 16, BP 108/54 mmHg and O2 sat 94% on room air.  The patient was started on normal saline infusion at 150 mL/h and lorazepam 1 mg IVP was given.  Lab work: An i-STAT venous blood gas showed a PCO2 of 37.1 mmHg, sodium of 122 mmol/L, hemoglobin of 16.7 g/dL room hematocrit of 16.6%.  CBC showed a white count of 10.3 with a normal differential, hemoglobin 15.3 g/dL and platelets of  063.  She had an unremarkable PT/INR/PTT.  Sodium was 122, potassium 3.3, chloride 86 and CO2 22 mmol/L.  Anion gap was normal.  Glucose 149, BUN 15 and creatinine 1.05 mg/dL.  Calcium and the hepatic functions are within normal limits.  Imaging: CT head without contrast did not show any acute intracranial abnormality.  Please see images and full radiology report for further detail.  Review of Systems: As per HPI otherwise all other systems reviewed and are negative.  Past Medical History:  Diagnosis Date   Anxiety    Asthma    Diabetes mellitus without complication (HCC)    Hypertension    Stroke [facility])     Past Surgical History:  Procedure Laterality Date   arm fracture     arm surgery   CHOLECYSTECTOMY     Social History  reports that she has never smoked. She has never used smokeless tobacco. She reports current drug use. Drug: Marijuana. She reports that she does not drink alcohol.  Allergies  Allergen Reactions   Sitagliptin     Other reaction(s): DIZZINESS. (JANUVIA)   Sulfa Antibiotics     (SULFA)   Family History  Problem Relation Age of Onset   Cancer Mother    Prior to Admission medications   Medication Sig Start Date End Date Taking? Authorizing Provider  acetaminophen (TYLENOL) 325 MG tablet Take by mouth. 06/13/20   [provider]  amLODipine (NORVASC) 5 MG tablet Take 1 tablet (5 mg total) by mouth daily. 04/23/20   CiriglianoJearld Lesch, DO  aspirin 325 MG tablet Take  by mouth. 06/19/20   [provider]  clonazePAM (KLONOPIN) 0.5 MG tablet Take 1 tablet (0.5 mg total) by mouth 2 (two) times daily as needed for anxiety. 03/05/21   Cirigliano, Mary K, DO  FLUoxetine (PROZAC) 20 MG capsule TAKE 1 CAPSULE(20 MG) BY MOUTH DAILY 03/05/21   Cirigliano, Jearld Lesch, DO  losartan (COZAAR) 100 MG tablet Take 1 tablet (100 mg total) by mouth daily. 04/23/20   Cirigliano, Jearld Lesch, DO  metFORMIN (GLUCOPHAGE XR) 500 MG 24 hr tablet Take 2 tablets (1,000 mg total) by  mouth daily with breakfast. 03/05/21   [name], DO  Multiple Vitamin (MULTI-VITAMIN) tablet Take by mouth.    [provider]  rosuvastatin (CRESTOR) 5 MG tablet Take 1 tablet (5 mg total) by mouth daily. 03/05/21   [name], DO   Physical Exam: Vitals:   03/14/21 1640 03/14/21 1701 03/14/21 1920  BP: (!) 108/54  105/79  Pulse: (!) 114  (!) 107  Resp: 16  17  Temp: 97.8 F (36.6 C)    TempSrc: Oral    SpO2: 94%  96%  Weight:  120 kg   Height:  5\' 5"  (1.651 m)    Constitutional: NAD, calm, comfortable Eyes: PERRL, lids and conjunctivae normal.  Mildly injected sclera. ENMT: Mucous membranes are moist, but lips look mildly dry.. Posterior pharynx clear of any exudate or lesions. Neck: normal, supple, no masses, no thyromegaly Respiratory: clear to auscultation bilaterally, no wheezing, no crackles. Normal respiratory effort. No accessory muscle use.  Cardiovascular: Tachycardic in the low 100s with a regular rhythm, no murmurs / rubs / gallops. No extremity edema. 2+ pedal pulses. No carotid bruits.  Abdomen: Obese, no distention.  Bowel sounds positive.  Soft, no tenderness, no masses palpated. No hepatosplenomegaly. Musculoskeletal: Mild generalized weakness.  No clubbing / cyanosis. Good ROM, no contractures. Normal muscle tone.  Skin: no acute rashes, lesions, ulcers on very limited dermatological examination. Neurologic: CN 2-12 grossly intact. Sensation intact, DTR normal. Strength 4/5 in RLE, 5/5 on all other extremities. Psychiatric: Normal judgment and insight. Alert and oriented x 3. Normal mood.   Labs on Admission: I have personally reviewed following labs and imaging studies  CBC: Recent Labs  Lab 03/14/21 1724 03/14/21 1734  WBC 10.3  --   NEUTROABS 6.2  --   HGB 15.3* 16.7*  HCT 44.5 49.0*  MCV 79.9*  --   PLT 448*  --    Basic Metabolic Panel: Recent Labs  Lab 03/14/21 1724 03/14/21 1734  NA 122* 122*  K 3.3* 3.5  CL 86*  --    CO2 22  --   GLUCOSE 149*  --   BUN 15  --   CREATININE 1.05*  --   CALCIUM 9.7  --    GFR: Estimated Creatinine Clearance: 61 mL/min (A) (by C-G formula based on SCr of 1.05 mg/dL (H)).  Liver Function Tests: Recent Labs  Lab 03/14/21 1724  AST 24  ALT 17  ALKPHOS 76  BILITOT 0.9  PROT 7.6  ALBUMIN 4.1   Radiological Exams on Admission: CT Head Wo Contrast  Result Date: 03/14/2021 CLINICAL DATA:  Syncope, fall. EXAM: CT HEAD WITHOUT CONTRAST TECHNIQUE: Contiguous axial images were obtained from the base of the skull through the vertex without intravenous contrast. COMPARISON:  May 05, 2020. FINDINGS: Brain: Mild chronic ischemic white matter disease is noted. No mass effect or midline shift is noted. Ventricular size is within normal limits. There is no  evidence of mass lesion, hemorrhage or acute infarction. Vascular: No hyperdense vessel or unexpected calcification. Skull: Normal. Negative for fracture or focal lesion. Sinuses/Orbits: No acute finding. Other: None. IMPRESSION: No acute intracranial abnormality seen. Electronically Signed   By: Lupita Raider M.D.   On: 03/14/2021 18:28    EKG: Independently reviewed.  Vent. rate 109 BPM PR interval 175 ms QRS duration 93 ms QT/QTcB 343/462 ms P-R-T axes 75 -37 75 Sinus tachycardia Consider right atrial enlargement Inferior infarct, old  Assessment/Plan Principal Problem:   Hyponatremia Observation/telemetry. Fluid restriction. Continue NS plus KCl infusion. Check urine sodium. Check serum and urine osmolality. Temporarily liberalize oral sodium intake. Follow-up sodium level in AM.  Active Problems:   Anxiety with depression Resume fluoxetine 20 mg p.o. daily. Continue clonazepam 0.5 mg p.o. twice daily as needed    Type 2 diabetes mellitus without complication,    without long-term current use of insulin (HCC) Carbohydrate modified diet. Continue metformin 1000 mg p.o. twice daily. CBG monitoring  with RI SS.    Essential hypertension Transiently hypotensive post lorazepam. Hold losartan tonight. Monitor blood pressure.    Hypokalemia Replacing. Check magnesium and phosphorus level.    Polycythemia/thrombocytosis Hemoconcentration? Continue aspirin. Follow-up H&H and platelet count.    Late effect of CVA (RLE weakness) On neurochecks. Supportive care.    Generalized weakness Likely due to hyponatremia. Consider PT evaluation if no improvement.    DVT prophylaxis: Lovenox SQ. Code Status:   Full code. Family Communication:   Disposition Plan:   Patient is from:  Assisted living.  Anticipated DC to:  Assisted living.  Anticipated DC date:  03/16/2021.  Anticipated DC barriers: Clinical status.  Consults called:   Admission status:  Observation/telemetry.  Severity of Illness:  High severity after presenting with a history of a fall secondary to dizziness with vision changes.  Her work-up showed hyponatremia and the patient will remain for 24 to 48 hours for close observation and further work-up.  Bobette Mo MD [facility]  How to contact the [facility] Attending or Consulting provider 7A - 7P or covering provider during after hours 7P -7A, for this patient?   Check the care team in [facility] and look for a) attending/consulting [facility] provider listed and b) the [facility] team listed Log into [email or web address] and use [place]'s universal password to access. If you do not have the password, please contact the hospital operator. Locate the [facility] provider you are looking for under [facility] and page to a number that you can be directly reached. If you still have difficulty reaching the provider, please page the [facility] (Director on Call) for the Hospitalists listed on amion for assistance.  03/14/2021, 8:31 PM   This document was prepared using Dragon voice recognition software and may contain some unintended transcription errors.

## 2021-03-14 NOTE — Plan of Care (Signed)
  Problem: Education: Goal: Knowledge of General Education information will improve Description Including pain rating scale, medication(s)/side effects and non-pharmacologic comfort measures Outcome: Progressing   

## 2021-03-15 DIAGNOSIS — W19XXXA Unspecified fall, initial encounter: Secondary | ICD-10-CM | POA: Diagnosis present

## 2021-03-15 DIAGNOSIS — D75839 Thrombocytosis, unspecified: Secondary | ICD-10-CM | POA: Diagnosis present

## 2021-03-15 DIAGNOSIS — Z20822 Contact with and (suspected) exposure to covid-19: Secondary | ICD-10-CM | POA: Diagnosis present

## 2021-03-15 DIAGNOSIS — F32A Depression, unspecified: Secondary | ICD-10-CM | POA: Diagnosis present

## 2021-03-15 DIAGNOSIS — Z833 Family history of diabetes mellitus: Secondary | ICD-10-CM | POA: Diagnosis not present

## 2021-03-15 DIAGNOSIS — E871 Hypo-osmolality and hyponatremia: Principal | ICD-10-CM

## 2021-03-15 DIAGNOSIS — Z9049 Acquired absence of other specified parts of digestive tract: Secondary | ICD-10-CM | POA: Diagnosis not present

## 2021-03-15 DIAGNOSIS — E785 Hyperlipidemia, unspecified: Secondary | ICD-10-CM | POA: Diagnosis present

## 2021-03-15 DIAGNOSIS — E876 Hypokalemia: Secondary | ICD-10-CM | POA: Diagnosis present

## 2021-03-15 DIAGNOSIS — E669 Obesity, unspecified: Secondary | ICD-10-CM | POA: Diagnosis present

## 2021-03-15 DIAGNOSIS — D751 Secondary polycythemia: Secondary | ICD-10-CM | POA: Diagnosis present

## 2021-03-15 DIAGNOSIS — E119 Type 2 diabetes mellitus without complications: Secondary | ICD-10-CM | POA: Diagnosis present

## 2021-03-15 DIAGNOSIS — F419 Anxiety disorder, unspecified: Secondary | ICD-10-CM | POA: Diagnosis present

## 2021-03-15 DIAGNOSIS — E861 Hypovolemia: Secondary | ICD-10-CM | POA: Diagnosis present

## 2021-03-15 DIAGNOSIS — R519 Headache, unspecified: Secondary | ICD-10-CM | POA: Diagnosis present

## 2021-03-15 DIAGNOSIS — N179 Acute kidney failure, unspecified: Secondary | ICD-10-CM | POA: Diagnosis present

## 2021-03-15 DIAGNOSIS — J45909 Unspecified asthma, uncomplicated: Secondary | ICD-10-CM | POA: Diagnosis present

## 2021-03-15 DIAGNOSIS — Z8249 Family history of ischemic heart disease and other diseases of the circulatory system: Secondary | ICD-10-CM | POA: Diagnosis not present

## 2021-03-15 DIAGNOSIS — Z7984 Long term (current) use of oral hypoglycemic drugs: Secondary | ICD-10-CM | POA: Diagnosis not present

## 2021-03-15 DIAGNOSIS — Z7982 Long term (current) use of aspirin: Secondary | ICD-10-CM | POA: Diagnosis not present

## 2021-03-15 DIAGNOSIS — I69341 Monoplegia of lower limb following cerebral infarction affecting right dominant side: Secondary | ICD-10-CM | POA: Diagnosis not present

## 2021-03-15 DIAGNOSIS — Z6841 Body Mass Index (BMI) 40.0 and over, adult: Secondary | ICD-10-CM | POA: Diagnosis not present

## 2021-03-15 DIAGNOSIS — I10 Essential (primary) hypertension: Secondary | ICD-10-CM | POA: Diagnosis present

## 2021-03-15 DIAGNOSIS — E878 Other disorders of electrolyte and fluid balance, not elsewhere classified: Secondary | ICD-10-CM | POA: Diagnosis present

## 2021-03-15 DIAGNOSIS — Z79899 Other long term (current) drug therapy: Secondary | ICD-10-CM | POA: Diagnosis not present

## 2021-03-15 LAB — URINALYSIS, ROUTINE W REFLEX MICROSCOPIC
Bacteria, UA: NONE SEEN
Bilirubin Urine: NEGATIVE
Glucose, UA: 50 mg/dL — AB
Ketones, ur: 5 mg/dL — AB
Leukocytes,Ua: NEGATIVE
Nitrite: NEGATIVE
Protein, ur: NEGATIVE mg/dL
Specific Gravity, Urine: 1.011 (ref 1.005–1.030)
pH: 6 (ref 5.0–8.0)

## 2021-03-15 LAB — BASIC METABOLIC PANEL
Anion gap: 12 (ref 5–15)
Anion gap: 10 (ref 5–15)
BUN: 12 mg/dL (ref 8–23)
BUN: 10 mg/dL (ref 8–23)
CO2: 22 mmol/L (ref 22–32)
CO2: 21 mmol/L — ABNORMAL LOW (ref 22–32)
Calcium: 9.3 mg/dL (ref 8.9–10.3)
Calcium: 9.3 mg/dL (ref 8.9–10.3)
Chloride: 90 mmol/L — ABNORMAL LOW (ref 98–111)
Chloride: 94 mmol/L — ABNORMAL LOW (ref 98–111)
Creatinine, Ser: 0.64 mg/dL (ref 0.44–1.00)
Creatinine, Ser: 0.53 mg/dL (ref 0.44–1.00)
GFR, Estimated: >60 mL/min (ref >60)
GFR, Estimated: >60 mL/min (ref >60)
Glucose, Bld: 140 mg/dL — ABNORMAL HIGH (ref 70–99)
Glucose, Bld: 111 mg/dL — ABNORMAL HIGH (ref 70–99)
Potassium: 3.2 mmol/L — ABNORMAL LOW (ref 3.5–5.1)
Potassium: 3.5 mmol/L (ref 3.5–5.1)
Sodium: 124 mmol/L — ABNORMAL LOW (ref 135–145)
Sodium: 125 mmol/L — ABNORMAL LOW (ref 135–145)

## 2021-03-15 LAB — RAPID URINE DRUG SCREEN, HOSP PERFORMED
Amphetamines: NOT DETECTED
Barbiturates: NOT DETECTED
Benzodiazepines: NOT DETECTED
Cocaine: NOT DETECTED
Opiates: NOT DETECTED
Tetrahydrocannabinol: NOT DETECTED

## 2021-03-15 LAB — CBC
HCT: 41.2 % (ref 36.0–46.0)
Hemoglobin: 14.6 g/dL (ref 12.0–15.0)
MCH: 28 pg (ref 26.0–34.0)
MCHC: 35.4 g/dL (ref 30.0–36.0)
MCV: 78.9 fL — ABNORMAL LOW (ref 80.0–100.0)
Platelets: 357 10*3/uL (ref 150–400)
RBC: 5.22 MIL/uL — ABNORMAL HIGH (ref 3.87–5.11)
RDW: 13.2 % (ref 11.5–15.5)
WBC: 10.4 10*3/uL (ref 4.0–10.5)
nRBC: 0 % (ref 0.0–0.2)

## 2021-03-15 LAB — MAGNESIUM: Magnesium: 1.8 mg/dL (ref 1.7–2.4)

## 2021-03-15 LAB — ETHANOL: Alcohol, Ethyl (B): <10 mg/dL (ref <10)

## 2021-03-15 LAB — GLUCOSE, CAPILLARY
Glucose-Capillary: 150 mg/dL — ABNORMAL HIGH (ref 70–99)
Glucose-Capillary: 194 mg/dL — ABNORMAL HIGH (ref 70–99)
Glucose-Capillary: 131 mg/dL — ABNORMAL HIGH (ref 70–99)
Glucose-Capillary: 143 mg/dL — ABNORMAL HIGH (ref 70–99)

## 2021-03-15 LAB — PHOSPHORUS: Phosphorus: 4.4 mg/dL (ref 2.5–4.6)

## 2021-03-15 MED ORDER — SODIUM CHLORIDE 0.9 % IV SOLN: INTRAVENOUS | Status: DC

## 2021-03-15 MED ORDER — POTASSIUM CHLORIDE CRYS ER 20 MEQ PO TBCR
40.0000 meq | EXTENDED_RELEASE_TABLET | Freq: Once | ORAL | Status: AC
  Administered 2021-03-15: 40 meq via ORAL
  Filled 2021-03-15: qty 2

## 2021-03-15 MED ORDER — SENNOSIDES-DOCUSATE SODIUM 8.6-50 MG PO TABS: 1.0000 | ORAL_TABLET | Freq: Every evening | ORAL | Status: DC | PRN

## 2021-03-15 MED ORDER — DM-GUAIFENESIN ER 30-600 MG PO TB12: 1.0000 | ORAL_TABLET | Freq: Two times a day (BID) | ORAL | Status: DC | PRN

## 2021-03-15 MED ORDER — IPRATROPIUM-ALBUTEROL 0.5-2.5 (3) MG/3ML IN SOLN: 3.0000 mL | RESPIRATORY_TRACT | Status: DC | PRN

## 2021-03-15 MED ORDER — HYDRALAZINE HCL 20 MG/ML IJ SOLN: 10.0000 mg | INTRAMUSCULAR | Status: DC | PRN

## 2021-03-15 MED ORDER — OXYCODONE HCL 5 MG PO TABS
5.0000 mg | ORAL_TABLET | ORAL | Status: DC | PRN
Start: 2021-03-15 — End: 2021-03-18
  Administered 2021-03-15 – 2021-03-18 (×4): 5 mg via ORAL
  Filled 2021-03-15 (×4): qty 1

## 2021-03-15 MED ORDER — TRAZODONE HCL 50 MG PO TABS
50.0000 mg | ORAL_TABLET | Freq: Every evening | ORAL | Status: DC | PRN
  Administered 2021-03-15 – 2021-03-17 (×2): 50 mg via ORAL
  Filled 2021-03-15 (×2): qty 1

## 2021-03-15 NOTE — Progress Notes (Signed)
Staffs responded to bed alarm noted patient pulling her IV out stating she doesn't want it in. Patient voice that she is upset because no one is giving her pain medication or her sleeping medication. RN and NT provide emotional support and reassurance that it is shift change and its only 730PM and will admin her med once chart reviewed. Patient states that she takes her sleeping and anxiety medication after dinner in the afternoon and unsure the exact time. Medication admin per request. PEri/abth provided for comfort at this time. Will continue to  monitor.

## 2021-03-15 NOTE — Plan of Care (Signed)
  Problem: Education: Goal: Knowledge of General Education information will improve Description Including pain rating scale, medication(s)/side effects and non-pharmacologic comfort measures Outcome: Progressing   

## 2021-03-15 NOTE — Progress Notes (Signed)
PROGRESS NOTE    Vannah Nadal  FIE:[id number] DOB: Sep 11, 1945 DOA: 03/14/2021 PCP: [name], DO   Brief Narrative:   75 y.o. female with medical history significant of anxiety, asthma, type II DM, hypertension, history of other nonhemorrhagic CVA with residual RLE weakness who is brought to the emergency department due to having dizziness this morning associated with vision changes (stated earlier "everything whiting out") resulting in a partially  averted fall while she was at her assisted living complex.  Upon admission noted to be clinically dehydrated with hyponatremia.   Assessment & Plan:   Principal Problem:   Hyponatremia Active Problems:   Anxiety with depression   Type 2 diabetes mellitus without complication, without long-term current use of insulin (HCC)   Generalized weakness   Essential hypertension   Hypokalemia   Thrombocytosis   Polycythemia   Late effect of CVA (RLE weakness)   Hypovolemic hyponatremia, sodium 122 -Check urine studies.  Sodium slowly improving.  Check BMP again this afternoon.  Continue normal saline.    Anxiety with depression Resume fluoxetine 20 mg p.o. daily.  Doubt this is contributing Continue clonazepam 0.5 mg p.o. twice daily as needed     Type 2 diabetes mellitus without complication,    without long-term current use of insulin (HCC) Carbohydrate modified diet. Continue metformin 1000 mg p.o. twice daily. Sliding scale and Accu-Cheks     Essential hypertension Norvasc 5 mg daily.  Losartan on hold     Hypokalemia Replete as needed     Polycythemia/thrombocytosis -Continue daily aspirin     Late effect of CVA (RLE weakness) On neurochecks. Supportive care.     Generalized weakness PT/OT evaluation   Hyperlipidemia - Crestor   DVT prophylaxis: Lovenox Code Status: Full code Family Communication:      Dispo: The patient is from: ALF              Anticipated d/c is to: ALF              Patient  currently is not medically stable to d/c.  Patient still hyponatremic, requiring IV fluids and periodic BMP   Difficult to place patient No       Subjective: Reporting of right ankle pain but mobility is okay.  Tells me before she passed out her vision turned white and became lightheaded.  Review of Systems Otherwise negative except as per HPI, including: General: Denies fever, chills, night sweats or unintended weight loss. Resp: Denies cough, wheezing, shortness of breath. Cardiac: Denies chest pain, palpitations, orthopnea, paroxysmal nocturnal dyspnea. GI: Denies abdominal pain, nausea, vomiting, diarrhea or constipation GU: Denies dysuria, frequency, hesitancy or incontinence MS: Denies muscle aches, joint pain or swelling Neuro: Denies headache, neurologic deficits (focal weakness, numbness, tingling), abnormal gait Psych: Denies anxiety, depression, SI/HI/AVH Skin: Denies new rashes or lesions ID: Denies sick contacts, exotic exposures, travel  Examination: Constitutional: Not in acute distress Respiratory: Clear to auscultation bilaterally Cardiovascular: Normal sinus rhythm, no rubs Abdomen: Nontender nondistended good bowel sounds Musculoskeletal: No edema noted Skin: No rashes seen Neurologic: CN 2-12 grossly intact.  And nonfocal Psychiatric: Normal judgment and insight. Alert and oriented x 3. Normal mood.   Objective: Vitals:   03/14/21 2212 03/14/21 2240 03/15/21 0321 03/15/21 0745  BP:  110/62 135/76 (!) 186/67  Pulse:  (!) 105 98 93  Resp:  18 18 17   Temp: 97.8 F (36.6 C) 97.9 F (36.6 C) (!) 97.5 F (36.4 C) 98.1 F (36.7 C)  TempSrc: Oral  Oral Oral Oral  SpO2:  97%  98%  Weight:  100.6 kg    Height:  5\' 5"  (1.651 m)      Intake/Output Summary (Last 24 hours) at 03/15/2021 0932 Last data filed at 03/15/2021 0406 Gross per 24 hour  Intake 696.77 ml  Output 0 ml  Net 696.77 ml   Filed Weights   03/14/21 1701 03/14/21 2240  Weight: 120 kg  100.6 kg     Data Reviewed:   CBC: Recent Labs  Lab 03/14/21 1724 03/14/21 1734 03/15/21 0605  WBC 10.3  --  10.4  NEUTROABS 6.2  --   --   HGB 15.3* 16.7* 14.6  HCT 44.5 49.0* 41.2  MCV 79.9*  --  78.9*  PLT 448*  --  357   Basic Metabolic Panel: Recent Labs  Lab 03/14/21 1724 03/14/21 1734 03/14/21 2349 03/15/21 0605  NA 122* 122*  --  124*  K 3.3* 3.5  --  3.2*  CL 86*  --   --  90*  CO2 22  --   --  22  GLUCOSE 149*  --   --  140*  BUN 15  --   --  12  CREATININE 1.05*  --   --  0.64  CALCIUM 9.7  --   --  9.3  MG  --   --  1.8  --   PHOS  --   --  4.4  --    GFR: Estimated Creatinine Clearance: 72.5 mL/min (by C-G formula based on SCr of 0.64 mg/dL). Liver Function Tests: Recent Labs  Lab 03/14/21 1724  AST 24  ALT 17  ALKPHOS 76  BILITOT 0.9  PROT 7.6  ALBUMIN 4.1   No results for input(s): LIPASE, AMYLASE in the last 168 hours. No results for input(s): AMMONIA in the last 168 hours. Coagulation Profile: Recent Labs  Lab 03/14/21 1724  INR 1.0   Cardiac Enzymes: No results for input(s): CKTOTAL, CKMB, CKMBINDEX, TROPONINI in the last 168 hours. BNP (last 3 results) No results for input(s): PROBNP in the last 8760 hours. HbA1C: No results for input(s): HGBA1C in the last 72 hours. CBG: Recent Labs  Lab 03/14/21 2249 03/15/21 0558  GLUCAP 166* 150*   Lipid Profile: No results for input(s): CHOL, HDL, LDLCALC, TRIG, CHOLHDL, LDLDIRECT in the last 72 hours. Thyroid Function Tests: No results for input(s): TSH, T4TOTAL, FREET4, T3FREE, THYROIDAB in the last 72 hours. Anemia Panel: No results for input(s): VITAMINB12, FOLATE, FERRITIN, TIBC, IRON, RETICCTPCT in the last 72 hours. Sepsis Labs: No results for input(s): PROCALCITON, LATICACIDVEN in the last 168 hours.  Recent Results (from the past 240 hour(s))  Resp Panel by RT-PCR (Flu A&B, Covid) Nasopharyngeal Swab     Status: None   Collection Time: 03/14/21  5:11 PM   Specimen:  Nasopharyngeal Swab; Nasopharyngeal(NP) swabs in vial transport medium  Result Value Ref Range Status   SARS Coronavirus 2 by RT PCR NEGATIVE NEGATIVE Final    Comment: (NOTE) SARS-CoV-2 target nucleic acids are NOT DETECTED.  The SARS-CoV-2 RNA is generally detectable in upper respiratory specimens during the acute phase of infection. The lowest concentration of SARS-CoV-2 viral copies this assay can detect is 138 copies/mL. A negative result does not preclude SARS-Cov-2 infection and should not be used as the sole basis for treatment or other patient management decisions. A negative result may occur with  improper specimen collection/handling, submission of specimen other than nasopharyngeal swab, presence of viral mutation(s) within  the areas targeted by this assay, and inadequate number of viral copies(<138 copies/mL). A negative result must be combined with clinical observations, patient history, and epidemiological information. The expected result is Negative.  Fact Sheet for Patients:  BloggerCourse.com  Fact Sheet for Healthcare Providers:  SeriousBroker.it  This test is no t yet approved or cleared by the [place] FDA and  has been authorized for detection and/or diagnosis of SARS-CoV-2 by FDA under an Emergency Use Authorization (EUA). This EUA will remain  in effect (meaning this test can be used) for the duration of the COVID-19 declaration under Section 564(b)(1) of the Act, 21 U.S.C.section 360bbb-3(b)(1), unless the authorization is terminated  or revoked sooner.       Influenza A by PCR NEGATIVE NEGATIVE Final   Influenza B by PCR NEGATIVE NEGATIVE Final    Comment: (NOTE) The Xpert Xpress SARS-CoV-2/FLU/RSV plus assay is intended as an aid in the diagnosis of influenza from Nasopharyngeal swab specimens and should not be used as a sole basis for treatment. Nasal washings and aspirates are unacceptable for  Xpert Xpress SARS-CoV-2/FLU/RSV testing.  Fact Sheet for Patients: BloggerCourse.com  Fact Sheet for Healthcare Providers: SeriousBroker.it  This test is not yet approved or cleared by the [place] FDA and has been authorized for detection and/or diagnosis of SARS-CoV-2 by FDA under an Emergency Use Authorization (EUA). This EUA will remain in effect (meaning this test can be used) for the duration of the COVID-19 declaration under Section 564(b)(1) of the Act, 21 U.S.C. section 360bbb-3(b)(1), unless the authorization is terminated or revoked.  Performed at [facility], [address] [place], [place] [address]          Radiology Studies: CT Head Wo Contrast  Result Date: 03/14/2021 CLINICAL DATA:  Syncope, fall. EXAM: CT HEAD WITHOUT CONTRAST TECHNIQUE: Contiguous axial images were obtained from the base of the skull through the vertex without intravenous contrast. COMPARISON:  May 05, 2020. FINDINGS: Brain: Mild chronic ischemic white matter disease is noted. No mass effect or midline shift is noted. Ventricular size is within normal limits. There is no evidence of mass lesion, hemorrhage or acute infarction. Vascular: No hyperdense vessel or unexpected calcification. Skull: Normal. Negative for fracture or focal lesion. Sinuses/Orbits: No acute finding. Other: None. IMPRESSION: No acute intracranial abnormality seen. Electronically Signed   By: Lupita Raider M.D.   On: 03/14/2021 18:28        Scheduled Meds:  amLODipine  5 mg Oral Daily   aspirin EC  81 mg Oral Daily   enoxaparin (LOVENOX) injection  40 mg Subcutaneous Q24H   FLUoxetine  20 mg Oral Daily   insulin aspart  0-15 Units Subcutaneous TID WC   metFORMIN  1,000 mg Oral Q breakfast   potassium chloride  40 mEq Oral Once   rosuvastatin  5 mg Oral QPM   Continuous Infusions:  sodium chloride       LOS: 0 days   Time spent= 35  mins    Arnold Kester Joline Maxcy, MD [facility]  If 7PM-7AM, please contact night-coverage  03/15/2021, 9:32 AM

## 2021-03-15 NOTE — Evaluation (Signed)
Occupational Therapy Evaluation Patient Details Name: Rebecca Barnes MRN: [id number] DOB: 02/13/1946 Today's Date: 03/15/2021    History of Present Illness 75 yo female admitted with dizziness with fall with vision change. Pt noted to be dehydrated and hyponatremia.  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 Pt currently with functional limitiations due to the deficits listed below (see OT problem list). Pt reports fall at ALF and then a second fall acutely after CT scan. Pt fearful of falling at this time. Pt with good return demo use of stedy for [facility] transfer with decrease anxiety. Pt lives alone in ALF apartment with kitchenette. Pt only makes tea in her kitchen space.  Pt will benefit from skilled OT to increase their independence and safety with adls and balance to allow discharge SNF pending progress back to RW use.     Follow Up Recommendations  SNF    Equipment Recommendations  3 in 1 bedside commode    Recommendations for Other Services       Precautions / Restrictions Precautions Precautions: Fall Precaution Comments: x2 falls in 12 hours ( one in hosptial per patient)      Mobility Bed Mobility Overal bed mobility: Modified Independent             General bed mobility comments: heavy use of bed rail and HOB >30 degrees    Transfers Overall transfer level: Needs assistance   Transfers: Sit to/from Stand Sit to Stand: Min guard;From elevated surface         General transfer comment: pt able to power up and weight bear on R LE without complaints    Balance                                           ADL either performed or assessed with clinical judgement   ADL Overall ADL's : Needs assistance/impaired Eating/Feeding: Set up;Bed level Eating/Feeding Details (indicate cue type and reason): noted to have food in bed  with patient Grooming: Wash/dry hands;Min guard;Bed level       Lower Body Bathing: Moderate assistance           Toilet Transfer: Min guard (STEDY) Toilet Transfer Details (indicate cue type and reason): pt able to use one step sequence to progress to stedy transfer to bedside commode Toileting- Clothing Manipulation and Hygiene: Min guard Toileting - Clothing Manipulation Details (indicate cue type and reason): static standing for peri care in stedy leaning on bar       General ADL Comments: pt very anxious about falling so using stedy to assess static standing balance and allow for [facility] safely     Vision Baseline Vision/History: Wears glasses Wears Glasses: At all times Vision Assessment?: Yes Eye Alignment: Within Functional Limits Ocular Range of Motion: Within Functional Limits Additional Comments: pt reports back to baseline. pt reports white color ( change in color noted) and change in acuity but has resolved. Pt able to scan during session for items. OT to further assess     Perception     Praxis      Pertinent Vitals/Pain Pain Assessment: Faces Faces Pain Scale: Hurts even more Pain Location: R leg reports hurts from fall acutely Pain Descriptors / Indicators: Discomfort Pain Intervention(s): Monitored during session;Repositioned  Hand Dominance Right   Extremity/Trunk Assessment Upper Extremity Assessment Upper Extremity Assessment: Overall WFL for tasks assessed   Lower Extremity Assessment Lower Extremity Assessment: Defer to PT evaluation   Cervical / Trunk Assessment Cervical / Trunk Assessment: Kyphotic;Other exceptions (body habitus with rounded shoulders)   Communication Communication Communication: No difficulties   Cognition Arousal/Alertness: Awake/alert Behavior During Therapy: Anxious (liable) Overall Cognitive Status: No family/caregiver present to determine baseline cognitive functioning                                  General Comments: pt noted to have cognitive deficits. pt needs 1 step commands. pt reports fall acutely but only chart review indicates fall at ALF. pt anxious about falling   General Comments  noted to have cut on L knee    Exercises     Shoulder Instructions      Home Living Family/patient expects to be discharged to:: Other (Comment)                                 Additional Comments: ALF [facility]      Prior Functioning/Environment Level of Independence: Independent                 OT Problem List: Decreased activity tolerance;Impaired balance (sitting and/or standing);Decreased cognition;Decreased safety awareness;Decreased knowledge of use of DME or AE;Decreased knowledge of precautions;Obesity;Pain      OT Treatment/Interventions: Self-care/ADL training;Energy conservation;DME and/or AE instruction;Manual therapy;Therapeutic activities;Cognitive remediation/compensation;Patient/family education;Balance training;Therapeutic exercise;Neuromuscular education    OT Goals(Current goals can be found in the care plan section) Acute Rehab OT Goals Patient Stated Goal: to not fall OT Goal Formulation: With patient Time For Goal Achievement: 03/29/21 Potential to Achieve Goals: Good  OT Frequency: Min 2X/week   Barriers to D/C: Decreased caregiver support  ALF apartment with kitchenette       Co-evaluation              AM-PAC OT "6 Clicks" Daily Activity     Outcome Measure Help from another person eating meals?: A Little Help from another person taking care of personal grooming?: A Lot Help from another person toileting, which includes using toliet, bedpan, or urinal?: A Lot Help from another person bathing (including washing, rinsing, drying)?: A Lot Help from another person to put on and taking off regular upper body clothing?: A Lot Help from another person to put on and taking off regular lower body clothing?: A Lot 6 Click Score:  13   End of Session Nurse Communication: Mobility status;Precautions  Activity Tolerance: Patient tolerated treatment well Patient left: in bed;with call bell/phone within reach;with bed alarm set  OT Visit Diagnosis: Unsteadiness on feet (R26.81);Muscle weakness (generalized) (M62.81);Pain Pain - Right/Left: Right Pain - part of body: Leg                Time: 0258-5277 OT Time Calculation (min): 22 min Charges:  OT General Charges $OT Visit: 1 Visit OT Evaluation $OT Eval Moderate Complexity: 1 Mod   Brynn, OTR/L  Acute Rehabilitation Services Pager: [phone] Office: [phone] .   Mateo Flow 03/15/2021, 11:56 AM

## 2021-03-16 DIAGNOSIS — E871 Hypo-osmolality and hyponatremia: Secondary | ICD-10-CM | POA: Diagnosis not present

## 2021-03-16 LAB — BASIC METABOLIC PANEL
Anion gap: 11 (ref 5–15)
Anion gap: 8 (ref 5–15)
Anion gap: 9 (ref 5–15)
BUN: 7 mg/dL — ABNORMAL LOW (ref 8–23)
BUN: 5 mg/dL — ABNORMAL LOW (ref 8–23)
BUN: 9 mg/dL (ref 8–23)
CO2: 21 mmol/L — ABNORMAL LOW (ref 22–32)
CO2: 25 mmol/L (ref 22–32)
CO2: 22 mmol/L (ref 22–32)
Calcium: 8.9 mg/dL (ref 8.9–10.3)
Calcium: 9.4 mg/dL (ref 8.9–10.3)
Calcium: 9.5 mg/dL (ref 8.9–10.3)
Chloride: 90 mmol/L — ABNORMAL LOW (ref 98–111)
Chloride: 91 mmol/L — ABNORMAL LOW (ref 98–111)
Chloride: 93 mmol/L — ABNORMAL LOW (ref 98–111)
Creatinine, Ser: 0.43 mg/dL — ABNORMAL LOW (ref 0.44–1.00)
Creatinine, Ser: 0.49 mg/dL (ref 0.44–1.00)
Creatinine, Ser: 0.46 mg/dL (ref 0.44–1.00)
GFR, Estimated: >60 mL/min (ref >60)
GFR, Estimated: >60 mL/min (ref >60)
GFR, Estimated: >60 mL/min (ref >60)
Glucose, Bld: 134 mg/dL — ABNORMAL HIGH (ref 70–99)
Glucose, Bld: 143 mg/dL — ABNORMAL HIGH (ref 70–99)
Glucose, Bld: 138 mg/dL — ABNORMAL HIGH (ref 70–99)
Potassium: 6.2 mmol/L — ABNORMAL HIGH (ref 3.5–5.1)
Potassium: 3.6 mmol/L (ref 3.5–5.1)
Potassium: 3.8 mmol/L (ref 3.5–5.1)
Sodium: 122 mmol/L — ABNORMAL LOW (ref 135–145)
Sodium: 124 mmol/L — ABNORMAL LOW (ref 135–145)
Sodium: 124 mmol/L — ABNORMAL LOW (ref 135–145)

## 2021-03-16 LAB — GLUCOSE, CAPILLARY
Glucose-Capillary: 157 mg/dL — ABNORMAL HIGH (ref 70–99)
Glucose-Capillary: 137 mg/dL — ABNORMAL HIGH (ref 70–99)
Glucose-Capillary: 117 mg/dL — ABNORMAL HIGH (ref 70–99)
Glucose-Capillary: 120 mg/dL — ABNORMAL HIGH (ref 70–99)

## 2021-03-16 LAB — MAGNESIUM: Magnesium: 1.8 mg/dL (ref 1.7–2.4)

## 2021-03-16 LAB — SODIUM, URINE, RANDOM: Sodium, Ur: 113 mmol/L

## 2021-03-16 LAB — OSMOLALITY, URINE: Osmolality, Ur: 519 mOsm/kg (ref 300–900)

## 2021-03-16 MED ORDER — ENOXAPARIN SODIUM 60 MG/0.6ML IJ SOSY
0.5000 mg/kg | PREFILLED_SYRINGE | INTRAMUSCULAR | Status: DC
  Administered 2021-03-16 – 2021-03-17 (×2): 50 mg via SUBCUTANEOUS
  Filled 2021-03-16 (×2): qty 0.6

## 2021-03-16 MED ORDER — SODIUM CHLORIDE 1 G PO TABS
1.0000 g | ORAL_TABLET | Freq: Three times a day (TID) | ORAL | Status: DC
  Administered 2021-03-16 – 2021-03-18 (×5): 1 g via ORAL
  Filled 2021-03-16 (×6): qty 1

## 2021-03-16 NOTE — TOC Initial Note (Signed)
Transition of Care [facility]) - Initial/Assessment Note    Patient Details  Name: Rebecca Barnes MRN: [id number] Date of Birth: 04-13-1946  Transition of Care [facility]) CM/SW Contact:    Emeterio Reeve, LCSW Phone Number: 03/16/2021, 4:38 PM  Clinical Narrative:                  CSW received SNF consult. CSW met with pt at bedside. CSW introduced self and explained role at the hospital. Pt reports that PTA she is a resident at [facility], pt has been there since January. PT reports she is able to complete ADL's on her own and uses a walker for mobility. Pt noticed she has become weaker of the past week before coming to hospital.   CSW reviewed PT/OT recommendations for SNF. Pt reports She is open to going to SNF. Pt gave CSW permission to fax out to facilities in the area. Pt has no preference of facility at this time. CSW gave pt medicare.gov rating list to review. CSW explained insurance auth process. Pt reports they are covid vaccinated, unsure about booster.  CSW will continue to follow.   Expected Discharge Plan: Skilled Nursing Facility Barriers to Discharge: Continued Medical Work up   Patient Goals and CMS Choice Patient states their goals for this hospitalization and ongoing recovery are:: TO get stronger CMS Medicare.gov Compare Post Acute Care list provided to:: Patient Choice offered to / list presented to : Patient  Expected Discharge Plan and Services Expected Discharge Plan: [place] arrangements for the past 2 months: [place]                                      Prior Living Arrangements/Services Living arrangements for the past 2 months: [place] Lives with:: Facility Resident Patient language and need for interpreter reviewed:: Yes        Need for Family Participation in Patient Care: Yes (Comment) Care giver support system in place?: Yes (comment)   Criminal Activity/Legal Involvement  Pertinent to Current Situation/Hospitalization: No - Comment as needed  Activities of Daily Living Home Assistive Devices/Equipment: Gilford Rile (specify type) ADL Screening (condition at time of admission) Patient's cognitive ability adequate to safely complete daily activities?: Yes Is the patient deaf or have difficulty hearing?: Yes (pt wears hearing aids, did not bring to hospital) Does the patient have difficulty seeing, even when wearing glasses/contacts?: No Does the patient have difficulty concentrating, remembering, or making decisions?: No Patient able to express need for assistance with ADLs?: Yes Does the patient have difficulty dressing or bathing?: Yes Independently performs ADLs?: No Does the patient have difficulty walking or climbing stairs?: Yes Weakness of Legs: Both Weakness of Arms/Hands: None  Permission Sought/Granted Permission sought to share information with : Chartered certified accountant granted to share information with : Yes, Verbal Permission Granted     Permission granted to share info w AGENCY: SNF        Emotional Assessment Appearance:: Appears stated age Attitude/Demeanor/Rapport: Engaged Affect (typically observed): Appropriate Orientation: : Oriented to Self, Oriented to Place, Oriented to  Time, Oriented to Situation Alcohol / Substance Use: Not Applicable Psych Involvement: No (comment)  Admission diagnosis:  Hyponatremia [E87.1] Fall in home, initial encounter [W19.Merril Abbe, T05.697] Patient Active Problem List   Diagnosis Date Noted   Hyponatremia 03/14/2021   Hypokalemia 03/14/2021  Thrombocytosis 03/14/2021   Polycythemia 03/14/2021   Late effect of CVA (RLE weakness) 03/14/2021   Essential hypertension 09/18/2019   Anxiety with depression 06/07/2019   Type 2 diabetes mellitus without complication, without long-term current use of insulin ([place]) 06/07/2019   Disturbance of skin sensation 06/07/2019   Leukocytosis 06/07/2019    T2DM (type 2 diabetes mellitus) ([place]) 06/07/2019   Generalized weakness 06/07/2019   ANXIETY 03/29/2007   PCP:  Ronnald Nian, DO Pharmacy:   [facility] [place], [place] - [address] AT [address] [place] [address] Phone: [phone] Fax: [phone]     Social Determinants of Health (SDOH) Interventions    Readmission Risk Interventions No flowsheet data found.  Emeterio Reeve, Latanya Presser, [name] Social Worker [phone]

## 2021-03-16 NOTE — Progress Notes (Signed)
PROGRESS NOTE    Rebecca Barnes  UXL:[id number] DOB: 22-Feb-1946 DOA: 03/14/2021 PCP: [name], DO   Brief Narrative:   75 y.o. female with medical history significant of anxiety, asthma, type II DM, hypertension, history of other nonhemorrhagic CVA with residual RLE weakness who is brought to the emergency department due to having dizziness this morning associated with vision changes (stated earlier "everything whiting out") resulting in a partially  averted fall while she was at her assisted living complex.  Upon admission noted to be clinically dehydrated with hyponatremia.   Assessment & Plan:   Principal Problem:   Hyponatremia Active Problems:   Anxiety with depression   Type 2 diabetes mellitus without complication, without long-term current use of insulin (HCC)   Generalized weakness   Essential hypertension   Hypokalemia   Thrombocytosis   Polycythemia   Late effect of CVA (RLE weakness)   Hypovolemic hyponatremia, sodium 124 -Check Urine studies, Cont IVF. Periodically check BMP    Anxiety with depression Resume fluoxetine 20 mg p.o. daily.   Continue clonazepam 0.5 mg p.o. twice daily as needed    Type 2 diabetes mellitus without complication,  without long-term current use of insulin (HCC) Carbohydrate modified diet. Continue metformin 1000 mg p.o. twice daily. Sliding scale and Accu-Cheks     Essential hypertension Norvasc 5 mg daily.  Losartan on hold     Hypokalemia Replete as needed     Polycythemia/thrombocytosis -Continue daily aspirin     Late effect of CVA (RLE weakness) On neurochecks. Supportive care.     Generalized weakness PT/OT evaluation   Hyperlipidemia - Crestor   DVT prophylaxis: Lovenox Code Status: Full code Family Communication:  [name] updated [phone]    Dispo: The patient is from: ALF              Anticipated d/c is to: ALF              Patient currently is not medically stable to d/c.  Patient still  hyponatremic, requiring IV fluids and periodic BMP   Difficult to place patient No       Subjective: Feels ok no complaints this morning. Feels better with IVF  Review of Systems Otherwise negative except as per HPI, including: General = no fevers, chills, dizziness,  fatigue HEENT/EYES = negative for loss of vision, double vision, blurred vision,  sore throa Cardiovascular= negative for chest pain, palpitation Respiratory/lungs= negative for shortness of breath, cough, wheezing; hemoptysis,  Gastrointestinal= negative for nausea, vomiting, abdominal pain Genitourinary= negative for Dysuria MSK = Negative for arthralgia, myalgias Neurology= Negative for headache, numbness, tingling  Psychiatry= Negative for suicidal and homocidal ideation Skin= Negative for Rash   Examination: Constitutional: Not in acute distress Respiratory: Clear to auscultation bilaterally Cardiovascular: Normal sinus rhythm, no rubs Abdomen: Nontender nondistended good bowel sounds Musculoskeletal: No edema noted Skin: No rashes seen Neurologic: CN 2-12 grossly intact.  And nonfocal Psychiatric: Normal judgment and insight. Alert and oriented x 3. Normal mood.     Objective: Vitals:   03/15/21 2345 03/16/21 0459 03/16/21 0740 03/16/21 0904  BP: (!) 159/58 (!) 159/85 (!) 155/53 (!) 150/75  Pulse: (!) 106 (!) 105 100   Resp: 18 18 18    Temp:  98.2 F (36.8 C) 97.6 F (36.4 C)   TempSrc:  Oral Oral   SpO2: 98% 96% 98%   Weight:      Height:        Intake/Output Summary (Last 24 hours) at  03/16/2021 1213 Last data filed at 03/15/2021 2011 Gross per 24 hour  Intake 240 ml  Output --  Net 240 ml   Filed Weights   03/14/21 1701 03/14/21 2240  Weight: 120 kg 100.6 kg     Data Reviewed:   CBC: Recent Labs  Lab 03/14/21 1724 03/14/21 1734 03/15/21 0605  WBC 10.3  --  10.4  NEUTROABS 6.2  --   --   HGB 15.3* 16.7* 14.6  HCT 44.5 49.0* 41.2  MCV 79.9*  --  78.9*  PLT 448*  --  357    Basic Metabolic Panel: Recent Labs  Lab 03/14/21 1724 03/14/21 1734 03/14/21 2349 03/15/21 0605 03/15/21 1524 03/16/21 0700 03/16/21 1042  NA 122* 122*  --  124* 125* 122* 124*  K 3.3* 3.5  --  3.2* 3.5 6.2* 3.6  CL 86*  --   --  90* 94* 90* 91*  CO2 22  --   --  22 21* 21* 25  GLUCOSE 149*  --   --  140* 111* 134* 143*  BUN 15  --   --  12 10 7* 5*  CREATININE 1.05*  --   --  0.64 0.53 0.43* 0.49  CALCIUM 9.7  --   --  9.3 9.3 8.9 9.4  MG  --   --  1.8  --   --  1.8  --   PHOS  --   --  4.4  --   --   --   --    GFR: Estimated Creatinine Clearance: 72.5 mL/min (by C-G formula based on SCr of 0.49 mg/dL). Liver Function Tests: Recent Labs  Lab 03/14/21 1724  AST 24  ALT 17  ALKPHOS 76  BILITOT 0.9  PROT 7.6  ALBUMIN 4.1   No results for input(s): LIPASE, AMYLASE in the last 168 hours. No results for input(s): AMMONIA in the last 168 hours. Coagulation Profile: Recent Labs  Lab 03/14/21 1724  INR 1.0   Cardiac Enzymes: No results for input(s): CKTOTAL, CKMB, CKMBINDEX, TROPONINI in the last 168 hours. BNP (last 3 results) No results for input(s): PROBNP in the last 8760 hours. HbA1C: No results for input(s): HGBA1C in the last 72 hours. CBG: Recent Labs  Lab 03/15/21 1105 03/15/21 1631 03/15/21 2014 03/16/21 0604 03/16/21 1057  GLUCAP 194* 131* 143* 157* 137*   Lipid Profile: No results for input(s): CHOL, HDL, LDLCALC, TRIG, CHOLHDL, LDLDIRECT in the last 72 hours. Thyroid Function Tests: No results for input(s): TSH, T4TOTAL, FREET4, T3FREE, THYROIDAB in the last 72 hours. Anemia Panel: No results for input(s): VITAMINB12, FOLATE, FERRITIN, TIBC, IRON, RETICCTPCT in the last 72 hours. Sepsis Labs: No results for input(s): PROCALCITON, LATICACIDVEN in the last 168 hours.  Recent Results (from the past 240 hour(s))  Resp Panel by RT-PCR (Flu A&B, Covid) Nasopharyngeal Swab     Status: None   Collection Time: 03/14/21  5:11 PM   Specimen:  Nasopharyngeal Swab; Nasopharyngeal(NP) swabs in vial transport medium  Result Value Ref Range Status   SARS Coronavirus 2 by RT PCR NEGATIVE NEGATIVE Final    Comment: (NOTE) SARS-CoV-2 target nucleic acids are NOT DETECTED.  The SARS-CoV-2 RNA is generally detectable in upper respiratory specimens during the acute phase of infection. The lowest concentration of SARS-CoV-2 viral copies this assay can detect is 138 copies/mL. A negative result does not preclude SARS-Cov-2 infection and should not be used as the sole basis for treatment or other patient management decisions. A negative  result may occur with  improper specimen collection/handling, submission of specimen other than nasopharyngeal swab, presence of viral mutation(s) within the areas targeted by this assay, and inadequate number of viral copies(<138 copies/mL). A negative result must be combined with clinical observations, patient history, and epidemiological information. The expected result is Negative.  Fact Sheet for Patients:  BloggerCourse.com  Fact Sheet for Healthcare Providers:  SeriousBroker.it  This test is no t yet approved or cleared by the [place] FDA and  has been authorized for detection and/or diagnosis of SARS-CoV-2 by FDA under an Emergency Use Authorization (EUA). This EUA will remain  in effect (meaning this test can be used) for the duration of the COVID-19 declaration under Section 564(b)(1) of the Act, 21 U.S.C.section 360bbb-3(b)(1), unless the authorization is terminated  or revoked sooner.       Influenza A by PCR NEGATIVE NEGATIVE Final   Influenza B by PCR NEGATIVE NEGATIVE Final    Comment: (NOTE) The Xpert Xpress SARS-CoV-2/FLU/RSV plus assay is intended as an aid in the diagnosis of influenza from Nasopharyngeal swab specimens and should not be used as a sole basis for treatment. Nasal washings and aspirates are unacceptable for  Xpert Xpress SARS-CoV-2/FLU/RSV testing.  Fact Sheet for Patients: BloggerCourse.com  Fact Sheet for Healthcare Providers: SeriousBroker.it  This test is not yet approved or cleared by the [place] FDA and has been authorized for detection and/or diagnosis of SARS-CoV-2 by FDA under an Emergency Use Authorization (EUA). This EUA will remain in effect (meaning this test can be used) for the duration of the COVID-19 declaration under Section 564(b)(1) of the Act, 21 U.S.C. section 360bbb-3(b)(1), unless the authorization is terminated or revoked.  Performed at [facility], [address], [place], [place] [address]          Radiology Studies: CT Head Wo Contrast  Result Date: 03/14/2021 CLINICAL DATA:  Syncope, fall. EXAM: CT HEAD WITHOUT CONTRAST TECHNIQUE: Contiguous axial images were obtained from the base of the skull through the vertex without intravenous contrast. COMPARISON:  May 05, 2020. FINDINGS: Brain: Mild chronic ischemic white matter disease is noted. No mass effect or midline shift is noted. Ventricular size is within normal limits. There is no evidence of mass lesion, hemorrhage or acute infarction. Vascular: No hyperdense vessel or unexpected calcification. Skull: Normal. Negative for fracture or focal lesion. Sinuses/Orbits: No acute finding. Other: None. IMPRESSION: No acute intracranial abnormality seen. Electronically Signed   By: Lupita Raider M.D.   On: 03/14/2021 18:28        Scheduled Meds:  amLODipine  5 mg Oral Daily   aspirin EC  81 mg Oral Daily   enoxaparin (LOVENOX) injection  0.5 mg/kg Subcutaneous Q24H   FLUoxetine  20 mg Oral Daily   insulin aspart  0-15 Units Subcutaneous TID WC   metFORMIN  1,000 mg Oral Q breakfast   rosuvastatin  5 mg Oral QPM   Continuous Infusions:  sodium chloride 100 mL/hr at 03/16/21 0833     LOS: 1 day   Time spent= 35 mins    Tyneisha Hegeman  Joline Maxcy, MD [facility]  If 7PM-7AM, please contact night-coverage  03/16/2021, 12:13 PM

## 2021-03-16 NOTE — Evaluation (Signed)
Physical Therapy Evaluation Patient Details Name: Rebecca Barnes MRN: [id number] DOB: 05-09-1946 Today's Date: 03/16/2021   History of Present Illness  75 yo female admitted with dizziness with fall with vision change. Pt noted to be dehydrated and hyponatremia.  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 Comes from [facility]; Walks around complex independently with RW at baseline; Presents to PT with generalized weakness, history of falls, decr balance and fear of falling; She became anxious and tearful during session; notable decr in SP more than 20 mmHg from supine to sit (see bleow);  Pt currently with functional limitations due to the deficits listed below (see PT Problem List). Pt will benefit from skilled PT to increase their independence and safety with mobility to allow discharge to the venue listed below.       Follow Up Recommendations SNF;Other (comment) (will watch for progress; may progress well enough to get back to ALF)    Equipment Recommendations  3in1 (PT)    Recommendations for Other Services       Precautions / Restrictions Precautions Precautions: Fall Precaution Comments: gets very anxious Restrictions Weight Bearing Restrictions: No      Mobility  Bed Mobility Overal bed mobility: Needs Assistance Bed Mobility: Supine to Sit     Supine to sit: Min assist     General bed mobility comments: Min handheld assist to pull to sit; noted pt ModI with getting up with OT earlier; opted to assist for better use of time    Transfers Overall transfer level: Needs assistance Equipment used: Rolling walker (2 wheeled) Transfers: Sit to/from Stand Sit to Stand: Min guard;Min assist         General transfer comment: pt able to power up and weight bear on R LE without complaints; mingaurd assist and cues for hand placement standing from  elevated bed; min assist to stand from low bed  Ambulation/Gait Ambulation/Gait assistance: Min assist;+2 safety/equipment Gait Distance (Feet):  (pivotal steps bed to chair and march in place in front of chair) Assistive device: Rolling walker (2 wheeled) Gait Pattern/deviations: Shuffle     General Gait Details: Good support of self on RW; very nervous about falling  Careers information officer    Modified Rankin (Stroke Patients Only)       Balance Overall balance assessment: Needs assistance   Sitting balance-Leahy Scale: Good       Standing balance-Leahy Scale: Poor (approaching Fair)                               Pertinent Vitals/Pain Pain Assessment: Faces Faces Pain Scale: Hurts little more Pain Location: R leg reports hurts from fall acutely Pain Descriptors / Indicators: Discomfort Pain Intervention(s): Monitored during session;Other (comment) (still able to bear weight and take steps on the right leg)    Home Living Family/patient expects to be discharged to:: Other (Comment) ([facility]) Living Arrangements: Alone (but with ALF staff) Available Help at Discharge: Other (Comment) (ALF staff) Type of Home: Assisted living       Home Layout: One level   Additional Comments: ALF [facility]    Prior Function Level of Independence: Independent with assistive device(s)         Comments: Uses RW to wlak to dining hall  Hand Dominance   Dominant Hand: Right    Extremity/Trunk Assessment   Upper Extremity Assessment Upper Extremity Assessment: Defer to OT evaluation    Lower Extremity Assessment Lower Extremity Assessment: Generalized weakness;RLE deficits/detail RLE Deficits / Details: ANkle, knee, hip AROM WFL for simple mobility tasks; able to bear weight for standing and marching in place    Cervical / Trunk Assessment Cervical / Trunk Assessment: Kyphotic;Other exceptions (body habitus with  rounded shoulders)  Communication   Communication: No difficulties  Cognition Arousal/Alertness: Awake/alert Behavior During Therapy: Anxious (labile) Overall Cognitive Status: No family/caregiver present to determine baseline cognitive functioning                                 General Comments: TEarful at times      General Comments General comments (skin integrity, edema, etc.): Attemtped to obtain orthostatics; noted dizziness in sitting and standing; Observed a drop in SBP from supine to sitting; unable to obtain a reliable BP in standing   03/16/21 0900 03/16/21 3382  Vital Signs  Patient Position (if appropriate) Orthostatic Vitals  --   Orthostatic Lying   BP- Lying 150/75  --   Pulse- Lying 100  --   Orthostatic Sitting  BP- Sitting 126/75 (!) 154/96  Pulse- Sitting 100 111  Orthostatic Standing at 0 minutes  BP- Standing at 0 minutes  (unable to obtain reliable BP due to difficulty relaxing RUE)  --       Exercises     Assessment/Plan    PT Assessment Patient needs continued PT services  PT Problem List Decreased strength;Decreased activity tolerance;Decreased balance;Decreased mobility;Decreased knowledge of use of DME;Decreased cognition;Decreased safety awareness;Decreased knowledge of precautions;Cardiopulmonary status limiting activity;Pain       PT Treatment Interventions DME instruction;Gait training;Functional mobility training;Therapeutic activities;Therapeutic exercise;Balance training;Patient/family education    PT Goals (Current goals can be found in the Care Plan section)  Acute Rehab PT Goals Patient Stated Goal: to not fall PT Goal Formulation: With patient Time For Goal Achievement: 03/30/21 Potential to Achieve Goals: Good    Frequency Min 3X/week   Barriers to discharge        Co-evaluation               AM-PAC PT "6 Clicks" Mobility  Outcome Measure Help needed turning from your back to your side while in  a flat bed without using bedrails?: None Help needed moving from lying on your back to sitting on the side of a flat bed without using bedrails?: A Little Help needed moving to and from a bed to a chair (including a wheelchair)?: A Little Help needed standing up from a chair using your arms (e.g., wheelchair or bedside chair)?: A Little Help needed to walk in hospital room?: A Lot Help needed climbing 3-5 steps with a railing? : Total 6 Click Score: 16    End of Session Equipment Utilized During Treatment: Gait belt Activity Tolerance: Patient tolerated treatment well Patient left: with chair alarm set;Other (comment) (on [facility]) Nurse Communication: Mobility status;Other (comment) (pt on [facility] with call bell near and will need assist soon) PT Visit Diagnosis: Unsteadiness on feet (R26.81);History of falling (Z91.81)    Time: 5053-9767 PT Time Calculation (min) (ACUTE ONLY): 35 min   Charges:   PT Evaluation $PT Eval Moderate Complexity: 1 Mod PT Treatments $Therapeutic Activity: 8-22 mins        Van Clines, PT  Acute  Rehabilitation Services Pager [phone] Office [phone]   Rebecca Barnes 03/16/2021, 11:02 AM

## 2021-03-16 NOTE — Progress Notes (Signed)
Occupational Therapy Treatment Patient Details Name: Rebecca Barnes MRN: [id number] DOB: [birth year] Today's Date: 03/16/2021    History of present illness 75 yo female admitted with dizziness with fall with vision change. Pt noted to be dehydrated and hyponatremia.  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 Pt in recliner upon arrival. Session focused on safe sit - stand and stand to sit transitions with RW, functional mobility with RW to sink for grooming/hygiene task, BSC transfers, toileting tasks. Pt anxious and tearful at times. OT will continue to follow acutely to maximize level of function and safety  Follow Up Recommendations  SNF    Equipment Recommendations  3 in 1 bedside commode;Tub/shower seat;Other (comment) (TBD at SNF)    Recommendations for Other Services      Precautions / Restrictions Precautions Precautions: Fall Precaution Comments: gets very anxious Restrictions Weight Bearing Restrictions: No       Mobility Bed Mobility Overal bed mobility: Needs Assistance Bed Mobility: Supine to Sit     Supine to sit: Min assist     General bed mobility comments: pt in recliner upon arrival    Transfers Overall transfer level: Needs assistance Equipment used: Rolling walker (2 wheeled) Transfers: Sit to/from Stand Sit to Stand: Min assist;Min guard         General transfer comment: min A from recliner, min guard A from [facility]    Balance Overall balance assessment: Needs assistance Sitting-balance support: Feet supported;No upper extremity supported Sitting balance-Leahy Scale: Good     Standing balance support: Bilateral upper extremity supported;During functional activity;Single extremity supported Standing balance-Leahy Scale: Poor                             ADL either performed or assessed with clinical judgement   ADL  Overall ADL's : Needs assistance/impaired     Grooming: Wash/dry hands;Wash/dry face;Minimal assistance;Min guard       Lower Body Bathing: Moderate assistance;Sitting/lateral leans Lower Body Bathing Details (indicate cue type and reason): simulated seated on [facility]         Toilet Transfer: Min guard;Ambulation;RW;BSC;Cueing for safety   Toileting- Clothing Manipulation and Hygiene: Minimal assistance;Sit to/from stand       Functional mobility during ADLs: Minimal assistance;Min guard;Rolling walker;Cueing for safety       Vision Baseline Vision/History: Wears glasses Wears Glasses: At all times Patient Visual Report: No change from baseline     Perception     Praxis      Cognition Arousal/Alertness: Awake/alert Behavior During Therapy: Anxious Overall Cognitive Status: No family/caregiver present to determine baseline cognitive functioning                                 General Comments: pt tearful at times        Exercises     Shoulder Instructions       General Comments     Pertinent Vitals/ Pain       Pain Assessment: 0-10 Pain Score: 5  Faces Pain Scale: Hurts little more Pain Location: R leg reports hurts from fall acutely Pain Descriptors / Indicators: Discomfort Pain Intervention(s): Monitored during session;Repositioned  Home Living Family/patient expects to be discharged to:: Other (Comment) ([facility]) Living Arrangements: Alone (but with ALF staff) Available Help  at Discharge: Other (Comment) (ALF staff) Type of Home: Assisted living       Home Layout: One level                   Additional Comments: ALF [facility]      Prior Functioning/Environment Level of Independence: Independent with assistive device(s)        Comments: Uses RW to wlak to dining hall   Frequency  Min 2X/week        Progress Toward Goals  OT Goals(current goals can now be found in the care plan section)  Progress  towards OT goals: Progressing toward goals  Acute Rehab OT Goals Patient Stated Goal: to not fall  Plan Discharge plan remains appropriate    Co-evaluation                 AM-PAC OT "6 Clicks" Daily Activity     Outcome Measure   Help from another person eating meals?: None Help from another person taking care of personal grooming?: A Little Help from another person toileting, which includes using toliet, bedpan, or urinal?: A Lot Help from another person bathing (including washing, rinsing, drying)?: A Lot Help from another person to put on and taking off regular upper body clothing?: A Little Help from another person to put on and taking off regular lower body clothing?: A Lot 6 Click Score: 16    End of Session Equipment Utilized During Treatment: Gait belt;Rolling walker;Other (comment) (BSC)  OT Visit Diagnosis: Unsteadiness on feet (R26.81);Muscle weakness (generalized) (M62.81);Pain Pain - Right/Left: Right Pain - part of body: Leg   Activity Tolerance Patient tolerated treatment well   Patient Left with call bell/phone within reach;in chair;with chair alarm set   Nurse Communication          Time: [phone] OT Time Calculation (min): 24 min  Charges: OT General Charges $OT Visit: 1 Visit OT Treatments $Self Care/Home Management : 8-22 mins $Therapeutic Activity: 8-22 mins    Galen Manila 03/16/2021, 1:36 PM

## 2021-03-16 NOTE — NC FL2 (Signed)
[place] MEDICAID FL2 LEVEL OF CARE SCREENING TOOL     IDENTIFICATION  Patient Name: Rebecca Barnes Birthdate: Jan 15, 1946 Sex: female Admission Date (Current Location): 03/14/2021  [facility] and [place] Number:  Producer, television/film/video and Address:  [facility], [address] [place], [place] [address]      Provider Number:    Attending Physician Name and Address:  Dimple Nanas, MD  Relative Name and Phone Number:       Current Level of Care:   Recommended Level of Care: Skilled Nursing Facility Prior Approval Number:    Date Approved/Denied:   PASRR Number: [id number]  Discharge Plan: SNF    Current Diagnoses: Patient Active Problem List   Diagnosis Date Noted   Hyponatremia 03/14/2021   Hypokalemia 03/14/2021   Thrombocytosis 03/14/2021   Polycythemia 03/14/2021   Late effect of CVA (RLE weakness) 03/14/2021   Essential hypertension 09/18/2019   Anxiety with depression 06/07/2019   Type 2 diabetes mellitus without complication, without long-term current use of insulin (HCC) 06/07/2019   Disturbance of skin sensation 06/07/2019   Leukocytosis 06/07/2019   T2DM (type 2 diabetes mellitus) (HCC) 06/07/2019   Generalized weakness 06/07/2019   ANXIETY 03/29/2007    Orientation RESPIRATION BLADDER Height & Weight     Self, Time, Situation, Place  Normal Continent Weight: 221 lb 12.5 oz (100.6 kg) Height:  5\' 5"  (165.1 cm)  BEHAVIORAL SYMPTOMS/MOOD NEUROLOGICAL BOWEL NUTRITION STATUS      Continent Diet  AMBULATORY STATUS COMMUNICATION OF NEEDS Skin   Limited Assist Verbally Normal                       Personal Care Assistance Level of Assistance  Bathing, Dressing, Feeding Bathing Assistance: Limited assistance Feeding assistance: Independent Dressing Assistance: Limited assistance     Functional Limitations Info  Sight, Hearing, Speech Sight Info: Adequate Hearing Info: Adequate Speech Info: Adequate    SPECIAL  CARE FACTORS FREQUENCY  PT (By licensed PT), OT (By licensed OT)     PT Frequency: 5x a week OT Frequency: 5x a week            Contractures Contractures Info: Not present    Additional Factors Info  Code Status, Allergies Code Status Info: Full Allergies Info: Sitagliptin   Sulfa Antibiotics           Current Medications (03/16/2021):  This is the current hospital active medication list Current Facility-Administered Medications  Medication Dose Route Frequency Provider Last Rate Last Admin   0.9 %  sodium chloride infusion   Intravenous Continuous Amin, Ankit Chirag, MD 100 mL/hr at 03/16/21 0833 New Bag at 03/16/21 0833   acetaminophen (TYLENOL) tablet 650 mg  650 mg Oral Q6H PRN 03/18/21, MD   650 mg at 03/15/21 1000   Or   acetaminophen (TYLENOL) suppository 650 mg  650 mg Rectal Q6H PRN 03/17/21, MD       amLODipine (NORVASC) tablet 5 mg  5 mg Oral Daily Bobette Mo, MD   5 mg at 03/16/21 03/18/21   aspirin EC tablet 81 mg  81 mg Oral Daily 4132, MD   81 mg at 03/16/21 03/18/21   clonazePAM (KLONOPIN) tablet 0.5 mg  0.5 mg Oral BID PRN 4401, MD   0.5 mg at 03/15/21 2005   dextromethorphan-guaiFENesin (MUCINEX DM) 30-600 MG per 12 hr tablet 1 tablet  1 tablet Oral BID PRN  Amin, Ankit Chirag, MD       enoxaparin (LOVENOX) injection 50 mg  0.5 mg/kg Subcutaneous Q24H Amin, Ankit Chirag, MD       FLUoxetine (PROZAC) capsule 20 mg  20 mg Oral Daily Bobette Mo, MD   20 mg at 03/16/21 9169   hydrALAZINE (APRESOLINE) injection 10 mg  10 mg Intravenous Q4H PRN Amin, Ankit Chirag, MD       insulin aspart (novoLOG) injection 0-15 Units  0-15 Units Subcutaneous TID WC Bobette Mo, MD   2 Units at 03/16/21 1113   ipratropium-albuterol (DUONEB) 0.5-2.5 (3) MG/3ML nebulizer solution 3 mL  3 mL Nebulization Q4H PRN Amin, Ankit Chirag, MD       metFORMIN (GLUCOPHAGE-XR) 24 hr tablet 1,000 mg  1,000 mg Oral Q breakfast  Bobette Mo, MD   1,000 mg at 03/16/21 4503   oxyCODONE (Oxy IR/ROXICODONE) immediate release tablet 5 mg  5 mg Oral Q4H PRN Amin, Ankit Chirag, MD   5 mg at 03/16/21 1625   rosuvastatin (CRESTOR) tablet 5 mg  5 mg Oral QPM Bobette Mo, MD   5 mg at 03/15/21 1749   senna-docusate (Senokot-S) tablet 1 tablet  1 tablet Oral QHS PRN Amin, Ankit Chirag, MD       sodium chloride tablet 1 g  1 g Oral TID WC Amin, Ankit Chirag, MD   1 g at 03/16/21 1625   traZODone (DESYREL) tablet 50 mg  50 mg Oral QHS PRN Dimple Nanas, MD   50 mg at 03/15/21 2005     Discharge Medications: Please see discharge summary for a list of discharge medications.  Relevant Imaging Results:  Relevant Lab Results:   Additional Information SSN: [id number] Covid vaccinated  Jimmy Picket, LCSW

## 2021-03-17 DIAGNOSIS — E871 Hypo-osmolality and hyponatremia: Secondary | ICD-10-CM | POA: Diagnosis not present

## 2021-03-17 LAB — BASIC METABOLIC PANEL
Anion gap: 9 (ref 5–15)
BUN: 7 mg/dL — ABNORMAL LOW (ref 8–23)
CO2: 24 mmol/L (ref 22–32)
Calcium: 9.2 mg/dL (ref 8.9–10.3)
Chloride: 97 mmol/L — ABNORMAL LOW (ref 98–111)
Creatinine, Ser: 0.41 mg/dL — ABNORMAL LOW (ref 0.44–1.00)
GFR, Estimated: >60 mL/min (ref >60)
Glucose, Bld: 131 mg/dL — ABNORMAL HIGH (ref 70–99)
Potassium: 3.9 mmol/L (ref 3.5–5.1)
Sodium: 130 mmol/L — ABNORMAL LOW (ref 135–145)

## 2021-03-17 LAB — GLUCOSE, CAPILLARY
Glucose-Capillary: 132 mg/dL — ABNORMAL HIGH (ref 70–99)
Glucose-Capillary: 128 mg/dL — ABNORMAL HIGH (ref 70–99)
Glucose-Capillary: 106 mg/dL — ABNORMAL HIGH (ref 70–99)
Glucose-Capillary: 127 mg/dL — ABNORMAL HIGH (ref 70–99)

## 2021-03-17 LAB — MAGNESIUM: Magnesium: 1.6 mg/dL — ABNORMAL LOW (ref 1.7–2.4)

## 2021-03-17 MED ORDER — MAGNESIUM SULFATE 2 GM/50ML IV SOLN
2.0000 g | Freq: Once | INTRAVENOUS | Status: AC
  Administered 2021-03-17: 2 g via INTRAVENOUS
  Filled 2021-03-17: qty 50

## 2021-03-17 NOTE — Progress Notes (Addendum)
Dr Nelson Chimes informed pt desats when she sleeps.  Night RN put con't pulse ox on last night as ordered, and sats dropped while sleeping 60-80%'s last night and placed her on O2 2L.     Pt was awaken due to sats at 50% with good wave form, she was sound asleep and mouth breathing, sats immediately went up to 90s (94-97%) when she woke up,  O2 placed back on at 2 L/[place] due to fact pt going back to sleep.

## 2021-03-17 NOTE — Progress Notes (Signed)
Physical Therapy Treatment Patient Details Name: Rebecca Barnes MRN: [id number] DOB: [birth year] Today's Date: 03/17/2021    History of Present Illness 75 yo female admitted with dizziness with fall with vision change. Pt noted to be dehydrated and hyponatremia.  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  lives at ALF ([facility])    PT Comments    Continuing work on functional mobility and activity tolerance;  Session focused on progressive amb and assessment of activity tolerance, with notable improvement in gait distance; Able to walk to the bathroom with RW and assistance; responded well to encouragement  Follow Up Recommendations  SNF;Other (comment)     Equipment Recommendations  3in1 (PT)    Recommendations for Other Services       Precautions / Restrictions Precautions Precautions: Fall Precaution Comments: gets very anxious    Mobility  Bed Mobility                    Transfers Overall transfer level: Needs assistance Equipment used: Rolling walker (2 wheeled) Transfers: Sit to/from Stand Sit to Stand: Min assist;Mod assist         General transfer comment: min assist to steady with standing to recliner; Mod assist to stand form low commode  Ambulation/Gait Ambulation/Gait assistance: Min guard Gait Distance (Feet): 40 Feet Assistive device: Rolling walker (2 wheeled) Gait Pattern/deviations: Decreased step length - right;Decreased step length - left Gait velocity: slow   General Gait Details: Good support of self on RW; very nervous about falling; responded well to encouragement; this PT stayed close for minguard assist while pulling recliner behind so that pt could sit when she needed to   Praxair Mobility    Modified Rankin (Stroke Patients Only)       Balance     Sitting balance-Leahy Scale: Good       Standing balance-Leahy Scale: Poor                               Cognition Arousal/Alertness: Awake/alert Behavior During Therapy: WFL for tasks assessed/performed (noting less anxious today) Overall Cognitive Status: No family/caregiver present to determine baseline cognitive functioning                                 General Comments: Less emotionally labile and anxious      Exercises      General Comments General comments (skin integrity, edema, etc.): able to walk to the bathroom with RW and close guard for safety      Pertinent Vitals/Pain Pain Assessment: Faces Faces Pain Scale: Hurts a little bit Pain Location: R leg reports hurts from fall acutely Pain Descriptors / Indicators: Discomfort Pain Intervention(s): Monitored during session    Home Living                      Prior Function            PT Goals (current goals can now be found in the care plan section) Acute Rehab PT Goals Patient Stated Goal: to not fall PT Goal Formulation: With patient Time For Goal Achievement: 03/30/21 Potential to Achieve Goals: Good Progress towards PT goals: Progressing toward goals    Frequency    Min 3X/week      PT  Plan Current plan remains appropriate    Co-evaluation              AM-PAC PT "6 Clicks" Mobility   Outcome Measure  Help needed turning from your back to your side while in a flat bed without using bedrails?: None Help needed moving from lying on your back to sitting on the side of a flat bed without using bedrails?: A Little Help needed moving to and from a bed to a chair (including a wheelchair)?: A Little Help needed standing up from a chair using your arms (e.g., wheelchair or bedside chair)?: A Little Help needed to walk in hospital room?: A Lot Help needed climbing 3-5 steps with a railing? : Total 6 Click Score: 16    End of Session Equipment Utilized During Treatment: Gait belt Activity Tolerance: Patient tolerated treatment well Patient left: in chair;with call  bell/phone within reach;with chair alarm set Nurse Communication: Mobility status PT Visit Diagnosis: Unsteadiness on feet (R26.81);History of falling (Z91.81)     Time: 2409-7353 PT Time Calculation (min) (ACUTE ONLY): 29 min  Charges:  $Gait Training: 8-22 mins $Therapeutic Activity: 8-22 mins                     Van Clines, PT  Acute Rehabilitation Services Pager [phone] Office [phone]    Rebecca Barnes 03/17/2021, 5:28 PM

## 2021-03-17 NOTE — TOC Progression Note (Addendum)
Transition of Care [facility]) - Progression Note    Patient Details  Name: Rebecca Barnes MRN: [id number] Date of Birth: 1945/11/18  Transition of [facility] CM/SW Contact  Jimmy Picket, [place] Phone Number: 03/17/2021, 3:21 PM  Clinical Narrative:     CSW gave bed offers to pt. Pt choice [facility] for SNF. CSW confirmed that SNF can take pt tomorrow. Pts covid test is pending.   Expected Discharge Plan: Skilled Nursing Facility Barriers to Discharge: Continued Medical Work up  Expected Discharge Plan and Services Expected Discharge Plan: Skilled Nursing Facility       Living arrangements for the past 2 months: Assisted Living Facility Expected Discharge Date: 03/17/21                                     Social Determinants of Health (SDOH) Interventions    Readmission Risk Interventions No flowsheet data found.  Jimmy Picket, Theresia Majors, [place] Clinical Social Worker [phone]

## 2021-03-17 NOTE — Progress Notes (Signed)
PROGRESS NOTE    Rebecca Barnes  CNO:[id number] DOB: Dec 08, 1945 DOA: 03/14/2021 PCP: [name], DO   Brief Narrative:   75 y.o. female with medical history significant of anxiety, asthma, type II DM, hypertension, history of other nonhemorrhagic CVA with residual RLE weakness who is brought to the emergency department due to having dizziness this morning associated with vision changes (stated earlier "everything whiting out") resulting in a partially  averted fall while she was at her assisted living complex.  Upon admission noted to be clinically dehydrated with hyponatremia. With IVF her Na levels improved. Therapy recommended SNF, tTOC was consulted   Assessment & Plan:   Hypovolemic hyponatremia, sodium 124 -Check Urin Resolved this morning sodium is 130.ety with depression Resume fluoxetine 20 mg p.o. daily.   Continue clonazepam 0.5 mg p.o. twice daily as needed    Type 2 diabetes mellitus without complication,  without long-term current use of insulin (HCC) Carbohydrate modified diet. Continue metformin 1000 mg p.o. twice daily. Sliding scale and Accu-Cheks     Essential hypertension Norvasc 5 mg daily.  Losartan on hold; resume upon dc     Hypokalemia Replete as needed     Polycythemia/thrombocytosis -Continue daily aspirin     Late effect of CVA (RLE weakness) On neurochecks. Supportive care.     Generalized weakness PT/OT evaluation- SNF. TOC working on placement.    Hyperlipidemia - Crestor   DVT prophylaxis: Lovenox Code Status: Full code Family Communication: Called [name] but no answer, left voicemail    Dispo: The patient is from: ALF              Anticipated d/c is to: SNF              Patient currently is medically stable for discharge.  TOC working on placement   Difficult to place patient No       Subjective: Treatment visit today patient was upset and telling me that she she had a fall and trauma to her knees when she was being  transported for the CAT scan upon admission.  Up until now she only reported a fall at her facility but never mentioned this last couple of days either to me or the nursing staff.  I also told her I did not see documentation of this in the computer. I attempted to call her daughter to see if she has told her anything but no answer.  Otherwise patient does not have any complaints doing okay.  Review of Systems Otherwise negative except as per HPI, including: General: Denies fever, chills, night sweats or unintended weight loss. Resp: Denies cough, wheezing, shortness of breath. Cardiac: Denies chest pain, palpitations, orthopnea, paroxysmal nocturnal dyspnea. GI: Denies abdominal pain, nausea, vomiting, diarrhea or constipation GU: Denies dysuria, frequency, hesitancy or incontinence MS: Denies muscle aches, joint pain or swelling Neuro: Denies headache, neurologic deficits (focal weakness, numbness, tingling), abnormal gait Psych: Denies anxiety, depression, SI/HI/AVH Skin: Denies new rashes or lesions ID: Denies sick contacts, exotic exposures, travel  Examination: Constitutional: Not in acute distress Respiratory: Clear to auscultation bilaterally Cardiovascular: Normal sinus rhythm, no rubs Abdomen: Nontender nondistended good bowel sounds Musculoskeletal: No edema noted Skin: Left knee superficial bruising/skin tear Neurologic: CN 2-12 grossly intact.  And nonfocal Psychiatric: Normal judgment and insight. Alert and oriented x 3. Normal mood.  Objective: Vitals:   03/16/21 2304 03/16/21 2305 03/17/21 0008 03/17/21 0431  BP:   138/74 138/66  Pulse:   100 96  Resp:   18  18  Temp:   97.7 F (36.5 C) 97.7 F (36.5 C)  TempSrc:   Oral Oral  SpO2: (!) 88% 96% 97% 100%  Weight:      Height:        Intake/Output Summary (Last 24 hours) at 03/17/2021 1122 Last data filed at 03/17/2021 1043 Gross per 24 hour  Intake 2741.09 ml  Output 1650 ml  Net 1091.09 ml   Filed Weights    03/14/21 1701 03/14/21 2240  Weight: 120 kg 100.6 kg     Data Reviewed:   CBC: Recent Labs  Lab 03/14/21 1724 03/14/21 1734 03/15/21 0605  WBC 10.3  --  10.4  NEUTROABS 6.2  --   --   HGB 15.3* 16.7* 14.6  HCT 44.5 49.0* 41.2  MCV 79.9*  --  78.9*  PLT 448*  --  357   Basic Metabolic Panel: Recent Labs  Lab 03/14/21 2349 03/15/21 0605 03/15/21 1524 03/16/21 0700 03/16/21 1042 03/16/21 1806 03/17/21 0704  NA  --    < > 125* 122* 124* 124* 130*  K  --    < > 3.5 6.2* 3.6 3.8 3.9  CL  --    < > 94* 90* 91* 93* 97*  CO2  --    < > 21* 21* 25 22 24   GLUCOSE  --    < > 111* 134* 143* 138* 131*  BUN  --    < > 10 7* 5* 9 7*  CREATININE  --    < > 0.53 0.43* 0.49 0.46 0.41*  CALCIUM  --    < > 9.3 8.9 9.4 9.5 9.2  MG 1.8  --   --  1.8  --   --  1.6*  PHOS 4.4  --   --   --   --   --   --    < > = values in this interval not displayed.   GFR: Estimated Creatinine Clearance: 72.5 mL/min (A) (by C-G formula based on SCr of 0.41 mg/dL (L)). Liver Function Tests: Recent Labs  Lab 03/14/21 1724  AST 24  ALT 17  ALKPHOS 76  BILITOT 0.9  PROT 7.6  ALBUMIN 4.1   No results for input(s): LIPASE, AMYLASE in the last 168 hours. No results for input(s): AMMONIA in the last 168 hours. Coagulation Profile: Recent Labs  Lab 03/14/21 1724  INR 1.0   Cardiac Enzymes: No results for input(s): CKTOTAL, CKMB, CKMBINDEX, TROPONINI in the last 168 hours. BNP (last 3 results) No results for input(s): PROBNP in the last 8760 hours. HbA1C: No results for input(s): HGBA1C in the last 72 hours. CBG: Recent Labs  Lab 03/16/21 1057 03/16/21 1604 03/16/21 2147 03/17/21 0615 03/17/21 1106  GLUCAP 137* 117* 120* 132* 128*   Lipid Profile: No results for input(s): CHOL, HDL, LDLCALC, TRIG, CHOLHDL, LDLDIRECT in the last 72 hours. Thyroid Function Tests: No results for input(s): TSH, T4TOTAL, FREET4, T3FREE, THYROIDAB in the last 72 hours. Anemia Panel: No results for  input(s): VITAMINB12, FOLATE, FERRITIN, TIBC, IRON, RETICCTPCT in the last 72 hours. Sepsis Labs: No results for input(s): PROCALCITON, LATICACIDVEN in the last 168 hours.  Recent Results (from the past 240 hour(s))  Resp Panel by RT-PCR (Flu A&B, Covid) Nasopharyngeal Swab     Status: None   Collection Time: 03/14/21  5:11 PM   Specimen: Nasopharyngeal Swab; Nasopharyngeal(NP) swabs in vial transport medium  Result Value Ref Range Status   SARS Coronavirus 2 by RT PCR NEGATIVE  NEGATIVE Final    Comment: (NOTE) SARS-CoV-2 target nucleic acids are NOT DETECTED.  The SARS-CoV-2 RNA is generally detectable in upper respiratory specimens during the acute phase of infection. The lowest concentration of SARS-CoV-2 viral copies this assay can detect is 138 copies/mL. A negative result does not preclude SARS-Cov-2 infection and should not be used as the sole basis for treatment or other patient management decisions. A negative result may occur with  improper specimen collection/handling, submission of specimen other than nasopharyngeal swab, presence of viral mutation(s) within the areas targeted by this assay, and inadequate number of viral copies(<138 copies/mL). A negative result must be combined with clinical observations, patient history, and epidemiological information. The expected result is Negative.  Fact Sheet for Patients:  BloggerCourse.com  Fact Sheet for Healthcare Providers:  SeriousBroker.it  This test is no t yet approved or cleared by the [place] FDA and  has been authorized for detection and/or diagnosis of SARS-CoV-2 by FDA under an Emergency Use Authorization (EUA). This EUA will remain  in effect (meaning this test can be used) for the duration of the COVID-19 declaration under Section 564(b)(1) of the Act, 21 U.S.C.section 360bbb-3(b)(1), unless the authorization is terminated  or revoked sooner.        Influenza A by PCR NEGATIVE NEGATIVE Final   Influenza B by PCR NEGATIVE NEGATIVE Final    Comment: (NOTE) The Xpert Xpress SARS-CoV-2/FLU/RSV plus assay is intended as an aid in the diagnosis of influenza from Nasopharyngeal swab specimens and should not be used as a sole basis for treatment. Nasal washings and aspirates are unacceptable for Xpert Xpress SARS-CoV-2/FLU/RSV testing.  Fact Sheet for Patients: BloggerCourse.com  Fact Sheet for Healthcare Providers: SeriousBroker.it  This test is not yet approved or cleared by the [place] FDA and has been authorized for detection and/or diagnosis of SARS-CoV-2 by FDA under an Emergency Use Authorization (EUA). This EUA will remain in effect (meaning this test can be used) for the duration of the COVID-19 declaration under Section 564(b)(1) of the Act, 21 U.S.C. section 360bbb-3(b)(1), unless the authorization is terminated or revoked.  Performed at [facility], [address] [place], [place] [address]          Radiology Studies: No results found.      Scheduled Meds:  amLODipine  5 mg Oral Daily   aspirin EC  81 mg Oral Daily   enoxaparin (LOVENOX) injection  0.5 mg/kg Subcutaneous Q24H   FLUoxetine  20 mg Oral Daily   insulin aspart  0-15 Units Subcutaneous TID WC   metFORMIN  1,000 mg Oral Q breakfast   rosuvastatin  5 mg Oral QPM   sodium chloride  1 g Oral TID WC   Continuous Infusions:  magnesium sulfate bolus IVPB       LOS: 2 days   Time spent= 35 mins    Kaleeyah Cuffie Joline Maxcy, MD [facility]  If 7PM-7AM, please contact night-coverage  03/17/2021, 11:22 AM

## 2021-03-18 DIAGNOSIS — E871 Hypo-osmolality and hyponatremia: Secondary | ICD-10-CM | POA: Diagnosis not present

## 2021-03-18 LAB — BASIC METABOLIC PANEL
Anion gap: 9 (ref 5–15)
BUN: 7 mg/dL — ABNORMAL LOW (ref 8–23)
CO2: 25 mmol/L (ref 22–32)
Calcium: 9.2 mg/dL (ref 8.9–10.3)
Chloride: 97 mmol/L — ABNORMAL LOW (ref 98–111)
Creatinine, Ser: 0.45 mg/dL (ref 0.44–1.00)
GFR, Estimated: >60 mL/min (ref >60)
Glucose, Bld: 137 mg/dL — ABNORMAL HIGH (ref 70–99)
Potassium: 3.6 mmol/L (ref 3.5–5.1)
Sodium: 131 mmol/L — ABNORMAL LOW (ref 135–145)

## 2021-03-18 LAB — RESP PANEL BY RT-PCR (FLU A&B, COVID) ARPGX2
Influenza A by PCR: NEGATIVE
Influenza B by PCR: NEGATIVE
SARS Coronavirus 2 by RT PCR: NEGATIVE

## 2021-03-18 LAB — GLUCOSE, CAPILLARY
Glucose-Capillary: 131 mg/dL — ABNORMAL HIGH (ref 70–99)
Glucose-Capillary: 139 mg/dL — ABNORMAL HIGH (ref 70–99)

## 2021-03-18 LAB — MAGNESIUM: Magnesium: 1.8 mg/dL (ref 1.7–2.4)

## 2021-03-18 MED ORDER — OXYCODONE HCL 5 MG PO TABS: 5.0000 mg | ORAL_TABLET | Freq: Four times a day (QID) | ORAL | 0 refills | Status: DC | PRN

## 2021-03-18 MED ORDER — POTASSIUM CHLORIDE CRYS ER 20 MEQ PO TBCR
40.0000 meq | EXTENDED_RELEASE_TABLET | Freq: Once | ORAL | Status: AC
  Administered 2021-03-18: 40 meq via ORAL
  Filled 2021-03-18: qty 2

## 2021-03-18 MED ORDER — CLONAZEPAM 0.5 MG PO TABS
0.5000 mg | ORAL_TABLET | Freq: Two times a day (BID) | ORAL | 0 refills | Status: DC | PRN
Start: 2021-03-18 — End: 2021-04-06

## 2021-03-18 NOTE — Progress Notes (Signed)
Report given to nurse Alona Bene at [place]. Notified pt's daughter of disposition. Pt aware of d/c to rehab. Awaiting for PTAR

## 2021-03-18 NOTE — Progress Notes (Signed)
PTAR at bedside to transport to disposition. All belongings packed and sent. VSS.

## 2021-03-18 NOTE — Discharge Summary (Signed)
Physician Discharge Summary  Rebecca Barnes VXB:[id number] DOB: [birth year] DOA: 03/14/2021  PCP: [name], DO  Admit date: 03/14/2021 Discharge date: 03/18/2021  Admitted From: ALF Disposition:  SNF  Recommendations for Outpatient Follow-up:  Follow up with PCP in 1-2 weeks Please obtain BMP/CBC in one week your next doctors visit.  Oxycodone IR and Klonopin prescription given    Discharge Condition: Stable CODE STATUS: Full code Diet recommendation: Diabetic  Brief/Interim Summary: 75 y.o. female with medical history significant of anxiety, asthma, type II DM, hypertension, history of other nonhemorrhagic CVA with residual RLE weakness who is brought to the emergency department due to having dizziness this morning associated with vision changes (stated earlier "everything whiting out") resulting in a partially  averted fall while she was at her assisted living complex.  Upon admission noted to be clinically dehydrated with hyponatremia. With IVF her Na levels improved. Therapy recommended SNF, TOC was consulted and arrangements for SNF were made.  Attempted to call her daughter on the day of discharge but no answer.   Assessment & Plan:   Hypovolemic hyponatremia, sodium 124 - Resolved sodium now is 131 Resume fluoxetine 20 mg p.o. daily.   Continue clonazepam 0.5 mg p.o. twice daily as needed    Type 2 diabetes mellitus without complication,  without long-term current use of insulin (HCC) Carbohydrate modified diet. Continue metformin 1000 mg p.o. twice daily. Sliding scale and Accu-Cheks     Essential hypertension Norvasc 5 mg daily.  Resume losartan on discharge     Hypokalemia Replete as needed     Polycythemia/thrombocytosis -Continue daily aspirin     Late effect of CVA (RLE weakness) On neurochecks. Supportive care.     Generalized weakness PT/OT evaluation- SNF.  TOC team following   Hyperlipidemia - Crestor  Body mass index is 36.91 kg/m.          Discharge Diagnoses:  Principal Problem:   Hyponatremia Active Problems:   Anxiety with depression   Type 2 diabetes mellitus without complication, without long-term current use of insulin (HCC)   Generalized weakness   Essential hypertension   Hypokalemia   Thrombocytosis   Polycythemia   Late effect of CVA (RLE weakness)      Subjective: Sitting up at the side of the bed in her chair, no complaints today.  Awaiting to go to rehab  Discharge Exam: Vitals:   03/18/21 0012 03/18/21 0440  BP: (!) 142/70 (!) 157/68  Pulse: 99 98  Resp: 18 18  Temp: 98 F (36.7 C) 98.1 F (36.7 C)  SpO2: 95% 98%   Vitals:   03/17/21 1209 03/17/21 1704 03/18/21 0012 03/18/21 0440  BP: 138/72 131/68 (!) 142/70 (!) 157/68  Pulse: 86 91 99 98  Resp: 18 19 18 18   Temp: 98.1 F (36.7 C) 97.6 F (36.4 C) 98 F (36.7 C) 98.1 F (36.7 C)  TempSrc: Oral Oral Oral Oral  SpO2: 99% 97% 95% 98%  Weight:      Height:        General: Pt is alert, awake, not in acute distress Cardiovascular: RRR, S1/S2 +, no rubs, no gallops Respiratory: CTA bilaterally, no wheezing, no rhonchi Abdominal: Soft, NT, ND, bowel sounds + Extremities: no edema, no cyanosis  Discharge Instructions   Allergies as of 03/18/2021       Reactions   Sitagliptin    Other reaction(s): DIZZINESS. (JANUVIA)   Sulfa Antibiotics    (SULFA)        Medication List  TAKE these medications    acetaminophen 325 MG tablet Commonly known as: TYLENOL Take 650 mg by mouth every 6 (six) hours as needed for mild pain or headache.   amLODipine 5 MG tablet Commonly known as: NORVASC Take 1 tablet (5 mg total) by mouth daily.   aspirin EC 81 MG tablet Take 81 mg by mouth daily. Swallow whole.   clonazePAM 0.5 MG tablet Commonly known as: KLONOPIN Take 1 tablet (0.5 mg total) by mouth 2 (two) times daily as needed for anxiety.   losartan 100 MG tablet Commonly known as: COZAAR Take 1 tablet (100 mg  total) by mouth daily.   Multi-Vitamin tablet Take 1 tablet by mouth daily.   oxyCODONE 5 MG immediate release tablet Commonly known as: Oxy IR/ROXICODONE Take 1 tablet (5 mg total) by mouth every 6 (six) hours as needed for moderate pain or severe pain.       ASK your doctor about these medications    FLUoxetine 20 MG capsule Commonly known as: PROZAC TAKE 1 CAPSULE(20 MG) BY MOUTH DAILY   metFORMIN 500 MG 24 hr tablet Commonly known as: Glucophage XR Take 2 tablets (1,000 mg total) by mouth daily with breakfast.   rosuvastatin 5 MG tablet Commonly known as: CRESTOR Take 1 tablet (5 mg total) by mouth daily.        Follow-up Information     [name], DO Follow up in 1 week(s).   Specialty: Family Medicine Contact information: [address] [place] [address] [phone]                Allergies  Allergen Reactions   Sitagliptin     Other reaction(s): DIZZINESS. (JANUVIA)   Sulfa Antibiotics     (SULFA)    You were cared for by a hospitalist during your hospital stay. If you have any questions about your discharge medications or the care you received while you were in the hospital after you are discharged, you can call the unit and asked to speak with the hospitalist on call if the hospitalist that took care of you is not available. Once you are discharged, your primary care physician will handle any further medical issues. Please note that no refills for any discharge medications will be authorized once you are discharged, as it is imperative that you return to your primary care physician (or establish a relationship with a primary care physician if you do not have one) for your aftercare needs so that they can reassess your need for medications and monitor your lab values.   Procedures/Studies: CT Head Wo Contrast  Result Date: 03/14/2021 CLINICAL DATA:  Syncope, fall. EXAM: CT HEAD WITHOUT CONTRAST TECHNIQUE: Contiguous axial  images were obtained from the base of the skull through the vertex without intravenous contrast. COMPARISON:  May 05, 2020. FINDINGS: Brain: Mild chronic ischemic white matter disease is noted. No mass effect or midline shift is noted. Ventricular size is within normal limits. There is no evidence of mass lesion, hemorrhage or acute infarction. Vascular: No hyperdense vessel or unexpected calcification. Skull: Normal. Negative for fracture or focal lesion. Sinuses/Orbits: No acute finding. Other: None. IMPRESSION: No acute intracranial abnormality seen. Electronically Signed   By: Lupita Raider M.D.   On: 03/14/2021 18:28     The results of significant diagnostics from this hospitalization (including imaging, microbiology, ancillary and laboratory) are listed below for reference.     Microbiology: Recent Results (from the past 240 hour(s))  Resp Panel  by RT-PCR (Flu A&B, Covid) Nasopharyngeal Swab     Status: None   Collection Time: 03/14/21  5:11 PM   Specimen: Nasopharyngeal Swab; Nasopharyngeal(NP) swabs in vial transport medium  Result Value Ref Range Status   SARS Coronavirus 2 by RT PCR NEGATIVE NEGATIVE Final    Comment: (NOTE) SARS-CoV-2 target nucleic acids are NOT DETECTED.  The SARS-CoV-2 RNA is generally detectable in upper respiratory specimens during the acute phase of infection. The lowest concentration of SARS-CoV-2 viral copies this assay can detect is 138 copies/mL. A negative result does not preclude SARS-Cov-2 infection and should not be used as the sole basis for treatment or other patient management decisions. A negative result may occur with  improper specimen collection/handling, submission of specimen other than nasopharyngeal swab, presence of viral mutation(s) within the areas targeted by this assay, and inadequate number of viral copies(<138 copies/mL). A negative result must be combined with clinical observations, patient history, and  epidemiological information. The expected result is Negative.  Fact Sheet for Patients:  BloggerCourse.com  Fact Sheet for Healthcare Providers:  SeriousBroker.it  This test is no t yet approved or cleared by the [place] FDA and  has been authorized for detection and/or diagnosis of SARS-CoV-2 by FDA under an Emergency Use Authorization (EUA). This EUA will remain  in effect (meaning this test can be used) for the duration of the COVID-19 declaration under Section 564(b)(1) of the Act, 21 U.S.C.section 360bbb-3(b)(1), unless the authorization is terminated  or revoked sooner.       Influenza A by PCR NEGATIVE NEGATIVE Final   Influenza B by PCR NEGATIVE NEGATIVE Final    Comment: (NOTE) The Xpert Xpress SARS-CoV-2/FLU/RSV plus assay is intended as an aid in the diagnosis of influenza from Nasopharyngeal swab specimens and should not be used as a sole basis for treatment. Nasal washings and aspirates are unacceptable for Xpert Xpress SARS-CoV-2/FLU/RSV testing.  Fact Sheet for Patients: BloggerCourse.com  Fact Sheet for Healthcare Providers: SeriousBroker.it  This test is not yet approved or cleared by the [place] FDA and has been authorized for detection and/or diagnosis of SARS-CoV-2 by FDA under an Emergency Use Authorization (EUA). This EUA will remain in effect (meaning this test can be used) for the duration of the COVID-19 declaration under Section 564(b)(1) of the Act, 21 U.S.C. section 360bbb-3(b)(1), unless the authorization is terminated or revoked.  Performed at [facility], [address], [place], [place] [address]      Labs: BNP (last 3 results) No results for input(s): BNP in the last 8760 hours. Basic Metabolic Panel: Recent Labs  Lab 03/14/21 2349 03/15/21 0605 03/16/21 0700 03/16/21 1042 03/16/21 1806 03/17/21 0704  03/18/21 0553  NA  --    < > 122* 124* 124* 130* 131*  K  --    < > 6.2* 3.6 3.8 3.9 3.6  CL  --    < > 90* 91* 93* 97* 97*  CO2  --    < > 21* 25 22 24 25   GLUCOSE  --    < > 134* 143* 138* 131* 137*  BUN  --    < > 7* 5* 9 7* 7*  CREATININE  --    < > 0.43* 0.49 0.46 0.41* 0.45  CALCIUM  --    < > 8.9 9.4 9.5 9.2 9.2  MG 1.8  --  1.8  --   --  1.6* 1.8  PHOS 4.4  --   --   --   --   --   --    < > =  values in this interval not displayed.   Liver Function Tests: Recent Labs  Lab 03/14/21 1724  AST 24  ALT 17  ALKPHOS 76  BILITOT 0.9  PROT 7.6  ALBUMIN 4.1   No results for input(s): LIPASE, AMYLASE in the last 168 hours. No results for input(s): AMMONIA in the last 168 hours. CBC: Recent Labs  Lab 03/14/21 1724 03/14/21 1734 03/15/21 0605  WBC 10.3  --  10.4  NEUTROABS 6.2  --   --   HGB 15.3* 16.7* 14.6  HCT 44.5 49.0* 41.2  MCV 79.9*  --  78.9*  PLT 448*  --  357   Cardiac Enzymes: No results for input(s): CKTOTAL, CKMB, CKMBINDEX, TROPONINI in the last 168 hours. BNP: Invalid input(s): POCBNP CBG: Recent Labs  Lab 03/17/21 0615 03/17/21 1106 03/17/21 1555 03/17/21 2132 03/18/21 0612  GLUCAP 132* 128* 106* 127* 131*   D-Dimer No results for input(s): DDIMER in the last 72 hours. Hgb A1c No results for input(s): HGBA1C in the last 72 hours. Lipid Profile No results for input(s): CHOL, HDL, LDLCALC, TRIG, CHOLHDL, LDLDIRECT in the last 72 hours. Thyroid function studies No results for input(s): TSH, T4TOTAL, T3FREE, THYROIDAB in the last 72 hours.  Invalid input(s): FREET3 Anemia work up No results for input(s): VITAMINB12, FOLATE, FERRITIN, TIBC, IRON, RETICCTPCT in the last 72 hours. Urinalysis    Component Value Date/Time   COLORURINE YELLOW 03/15/2021 1000   APPEARANCEUR CLEAR 03/15/2021 1000   LABSPEC 1.011 03/15/2021 1000   PHURINE 6.0 03/15/2021 1000   GLUCOSEU 50 (A) 03/15/2021 1000   HGBUR SMALL (A) 03/15/2021 1000   BILIRUBINUR  NEGATIVE 03/15/2021 1000   KETONESUR 5 (A) 03/15/2021 1000   PROTEINUR NEGATIVE 03/15/2021 1000   NITRITE NEGATIVE 03/15/2021 1000   LEUKOCYTESUR NEGATIVE 03/15/2021 1000   Sepsis Labs Invalid input(s): PROCALCITONIN,  WBC,  LACTICIDVEN Microbiology Recent Results (from the past 240 hour(s))  Resp Panel by RT-PCR (Flu A&B, Covid) Nasopharyngeal Swab     Status: None   Collection Time: 03/14/21  5:11 PM   Specimen: Nasopharyngeal Swab; Nasopharyngeal(NP) swabs in vial transport medium  Result Value Ref Range Status   SARS Coronavirus 2 by RT PCR NEGATIVE NEGATIVE Final    Comment: (NOTE) SARS-CoV-2 target nucleic acids are NOT DETECTED.  The SARS-CoV-2 RNA is generally detectable in upper respiratory specimens during the acute phase of infection. The lowest concentration of SARS-CoV-2 viral copies this assay can detect is 138 copies/mL. A negative result does not preclude SARS-Cov-2 infection and should not be used as the sole basis for treatment or other patient management decisions. A negative result may occur with  improper specimen collection/handling, submission of specimen other than nasopharyngeal swab, presence of viral mutation(s) within the areas targeted by this assay, and inadequate number of viral copies(<138 copies/mL). A negative result must be combined with clinical observations, patient history, and epidemiological information. The expected result is Negative.  Fact Sheet for Patients:  BloggerCourse.com  Fact Sheet for Healthcare Providers:  SeriousBroker.it  This test is no t yet approved or cleared by the [place] FDA and  has been authorized for detection and/or diagnosis of SARS-CoV-2 by FDA under an Emergency Use Authorization (EUA). This EUA will remain  in effect (meaning this test can be used) for the duration of the COVID-19 declaration under Section 564(b)(1) of the Act, 21 U.S.C.section  360bbb-3(b)(1), unless the authorization is terminated  or revoked sooner.       Influenza A by PCR NEGATIVE  NEGATIVE Final   Influenza B by PCR NEGATIVE NEGATIVE Final    Comment: (NOTE) The Xpert Xpress SARS-CoV-2/FLU/RSV plus assay is intended as an aid in the diagnosis of influenza from Nasopharyngeal swab specimens and should not be used as a sole basis for treatment. Nasal washings and aspirates are unacceptable for Xpert Xpress SARS-CoV-2/FLU/RSV testing.  Fact Sheet for Patients: BloggerCourse.comhttps://www.fda.gov/media/152166/download  Fact Sheet for Healthcare Providers: SeriousBroker.ithttps://www.fda.gov/media/152162/download  This test is not yet approved or cleared by the [place] FDA and has been authorized for detection and/or diagnosis of SARS-CoV-2 by FDA under an Emergency Use Authorization (EUA). This EUA will remain in effect (meaning this test can be used) for the duration of the COVID-19 declaration under Section 564(b)(1) of the Act, 21 U.S.C. section 360bbb-3(b)(1), unless the authorization is terminated or revoked.  Performed at [facility], [address] [place], [place] [id number]      Time coordinating discharge:  I have spent 35 minutes face to face with the patient and on the ward discussing the patients care, assessment, plan and disposition with other care givers. >50% of the time was devoted counseling the patient about the risks and benefits of treatment/Discharge disposition and coordinating care.   SIGNED:   Dimple NanasAnkit Chirag Arisa Congleton, MD  [facility] 03/18/2021, 7:37 AM   If 7PM-7AM, please contact night-coverage

## 2021-03-18 NOTE — TOC Transition Note (Signed)
Transition of Care [facility]) - CM/SW Discharge Note   Patient Details  Name: Jesyka Slaght MRN: [id number] Date of Birth: Aug 14, 1946  Transition of Care [facility]) CM/SW Contact:  Jimmy Picket, LCSW Phone Number: 03/18/2021, 10:43 AM   Clinical Narrative:     Patient will DC to: [facility] Anticipated DC date: 03/18/21 Family notified: Pt will notify family Transport by: Sharin Mons     Per MD patient ready for DC to [facility]. RN, patient, patient's family, and facility notified of DC. Discharge Summary and FL2 sent to facility. DC packet on chart. Ambulance transport requested for patient.    RN to call report to [phone].  CSW will sign off for now as social work intervention is no longer needed. Please consult [place] again if new needs arise.   Final next level of care: Skilled Nursing Facility Barriers to Discharge: Barriers Resolved   Patient Goals and CMS Choice Patient states their goals for this hospitalization and ongoing recovery are:: TO get stronger CMS Medicare.gov Compare Post Acute Care list provided to:: Patient Choice offered to / list presented to : Patient  Discharge Placement              Patient chooses bed at: [facility] Patient to be transferred to facility by: [facility] Name of family member notified: none Patient and family notified of of transfer: 03/18/21  Discharge Plan and Services                                     Social Determinants of Health (SDOH) Interventions     Readmission Risk Interventions No flowsheet data found.  Jimmy Picket, Theresia Majors, [place] Clinical Social Worker [phone]

## 2021-03-19 ENCOUNTER — Non-Acute Institutional Stay (SKILLED_NURSING_FACILITY): Payer: Self-pay | Admitting: Adult Health

## 2021-03-19 ENCOUNTER — Encounter: Payer: Self-pay | Admitting: Adult Health

## 2021-03-19 ENCOUNTER — Telehealth: Payer: Self-pay | Admitting: *Deleted

## 2021-03-19 DIAGNOSIS — E876 Hypokalemia: Secondary | ICD-10-CM

## 2021-03-19 DIAGNOSIS — E119 Type 2 diabetes mellitus without complications: Secondary | ICD-10-CM

## 2021-03-19 DIAGNOSIS — D751 Secondary polycythemia: Secondary | ICD-10-CM

## 2021-03-19 DIAGNOSIS — I10 Essential (primary) hypertension: Secondary | ICD-10-CM

## 2021-03-19 DIAGNOSIS — F418 Other specified anxiety disorders: Secondary | ICD-10-CM

## 2021-03-19 DIAGNOSIS — E871 Hypo-osmolality and hyponatremia: Secondary | ICD-10-CM

## 2021-03-19 DIAGNOSIS — I6930 Unspecified sequelae of cerebral infarction: Secondary | ICD-10-CM

## 2021-03-19 NOTE — Progress Notes (Addendum)
Location:  [facility] Room Number: 123-A Place of Service:  SNF (31) Provider:  Kenard Gower, DNP, FNP-BC  Patient Care Team: [name], DO as PCP - General (Family Medicine)  Extended Emergency Contact Information Primary Emergency Contact: Vandewater-GROSS,[name] Home Phone: [phone] Relation: Daughter  Code Status: Full code   Goals of care: Advanced Directive information Advanced Directives 03/14/2021  Does Patient Have a Medical Advance Directive? No  Would patient like information on creating a medical advance directive? No - Patient declined     Chief Complaint  Patient presents with   Hospitalization Follow-up    Hospital follow-up    HPI:  Pt is a 75 y.o. female who was admitted to [facility] on 03/18/21 post hospital admission 03/14/2021 to 03/18/2022.  She has a PMH of anxiety, asthma, type 2 diabetes mellitus, hypertension, history of other nonhemorrhagic CVA with residual RLE weakness.  She was brought to the ED with complaints of dizziness with vision changes on the morning of her hospital admission. She stated that " everything whiting out" resulting in a partially of verted fall while she was at care ALF.  Lab work showed clinical dehydration with hyponatremia, Na 122.  She was given IV fluids and her Na levels improved to 131.  She was transferred to [facility] for short-term rehabilitation.  She was seen in her room and was noted to cry when she talks. She talked about her husband who died several years ago. She is currently on Fluoxetine but denies having seen a psychiatrist. She has 2 degrees, social work and fine arts. 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 She has right-sided weakness as a residual from stroke.    Past Medical History:  Diagnosis Date   Anxiety    Asthma    Diabetes mellitus without complication (HCC)    Hypertension    Stroke [facility])     Past Surgical History:  Procedure Laterality Date   arm fracture     arm surgery   CHOLECYSTECTOMY      Allergies  Allergen Reactions   Sitagliptin     Other reaction(s): DIZZINESS. (JANUVIA)   Sulfa Antibiotics     (SULFA)    Outpatient Encounter Medications as of 03/19/2021  Medication Sig   acetaminophen (TYLENOL) 325 MG tablet Take 650 mg by mouth every 6 (six) hours as needed for mild pain or headache.   amLODipine (NORVASC) 5 MG tablet Take 1 tablet (5 mg total) by mouth daily.   aspirin EC 81 MG tablet Take 81 mg by mouth daily. Swallow whole.   bisacodyl (DULCOLAX) 10 MG suppository as needed. If not relieved by MOM, give 10 mg Bisacodyl suppositiory rectally X 1 dose in 24 hours as needed (Do not use constipation standing orders for residents with renal failure/CFR less than 30. Contact MD for orders)   clonazePAM (KLONOPIN) 0.5 MG tablet Take 1 tablet (0.5 mg total) by mouth 2 (two) times daily as needed for anxiety.   FLUoxetine (PROZAC) 20 MG capsule TAKE 1 CAPSULE(20 MG) BY MOUTH DAILY (Patient taking differently: Take 20 mg by mouth daily.)   losartan (COZAAR) 100 MG tablet Take 1 tablet (100 mg total) by mouth daily.   magnesium hydroxide (MILK OF MAGNESIA) 400 MG/5ML suspension If no BM in 3 days, give 30 cc Milk of Magnesium p.o. x 1 dose in 24 hours as needed (Do not use standing constipation orders for residents with  renal failure CFR less than 30. Contact MD for orders)   metFORMIN (GLUCOPHAGE XR) 500 MG 24 hr tablet Take 2 tablets (1,000 mg total) by mouth daily with breakfast. (Patient taking differently: Take 500 mg by mouth 2 (two) times daily.)   Multiple Vitamin (MULTI-VITAMIN) tablet Take 1 tablet by mouth daily.   oxyCODONE (OXY IR/ROXICODONE) 5 MG immediate release tablet Take 1 tablet (5 mg total) by mouth every 6 (six) hours as needed for moderate pain or severe pain.   rosuvastatin (CRESTOR) 5 MG tablet Take 1 tablet (5 mg total) by mouth daily.  (Patient taking differently: Take 5 mg by mouth every evening.)   Sodium Phosphates (RA SALINE ENEMA RE) Place rectally. If not relieved by Biscodyl suppository, give disposable Saline Enema rectally X 1 dose/24 hrs as needed (Do not use constipation standing orders for residents with renal failure/CFR less than 30. Contact MD for orders   No facility-administered encounter medications on file as of 03/19/2021.    Review of Systems  GENERAL: No change in appetite, no fatigue, no weight changes, no fever or chills MOUTH and THROAT: Denies oral discomfort, gingival pain or bleeding RESPIRATORY: no cough, SOB, DOE, wheezing, hemoptysis CARDIAC: No chest pain, edema or palpitations GI: No abdominal pain, diarrhea, constipation, heart burn, nausea or vomiting GU: Denies dysuria, frequency, hematuria, incontinence, or discharge NEUROLOGICAL: Denies dizziness, syncope, numbness, or headache PSYCHIATRIC: +depression   Immunization History  Administered Date(s) Administered   Fluad Quad(high Dose 65+) 06/06/2019   Influenza-Unspecified 04/16/2017, 05/08/2020   Janssen (J&J) SARS-COV-2 Vaccination 11/04/2019   Pneumococcal Conjugate-13 09/17/2016   Pneumococcal Polysaccharide-23 02/11/2009, 07/06/2018   Tdap 09/17/2016   Pertinent  Health Maintenance Due  Topic Date Due   MAMMOGRAM  Never done   FOOT EXAM  09/17/2020   HEMOGLOBIN A1C  10/21/2020   INFLUENZA VACCINE  03/23/2021   OPHTHALMOLOGY EXAM  04/16/2021   DEXA SCAN  Completed   PNA vac Low Risk Adult  Completed   Fall Risk  04/23/2020 02/13/2020 06/06/2019  Falls in the past year? 1 0 1  Number falls in past yr: 0 0 1  Injury with Fall? 1 0 1  Risk for fall due to : - History of fall(s) -  Follow up - Falls prevention discussed -     Vitals:   03/19/21 1105  BP: 107/63  Pulse: 100  Resp: (!) 21  Temp: 98.6 F (37 C)  Weight: 229 lb (103.9 kg)  Height: 5\' 5"  (1.651 m)   Body mass index is 38.11 kg/m.  Physical  Exam  GENERAL APPEARANCE: Well nourished. In no acute distress.  Morbidly obese  SKIN:  Skin is warm and dry.  MOUTH and THROAT: Lips are without lesions. Oral mucosa is moist and without lesions.  RESPIRATORY: Breathing is even & unlabored, BS CTAB CARDIAC: RRR, no murmur,no extra heart sounds, no edema GI: Abdomen soft, normal BS, no masses, no tenderness EXTREMITIES:  Able to move X 4 extremities NEUROLOGICAL: There is no tremor. Speech is clear. Alert and oriented X 3. PSYCHIATRIC:  crying  Labs reviewed: Recent Labs    03/14/21 2349 03/15/21 0605 03/16/21 0700 03/16/21 1042 03/16/21 1806 03/17/21 0704 03/18/21 0553  NA  --    < > 122*   < > 124* 130* 131*  K  --    < > 6.2*   < > 3.8 3.9 3.6  CL  --    < > 90*   < > 93* 97* 97*  CO2  --    < > 21*   < > 22 24 25   GLUCOSE  --    < > 134*   < > 138* 131* 137*  BUN  --    < > 7*   < > 9 7* 7*  CREATININE  --    < > 0.43*   < > 0.46 0.41* 0.45  CALCIUM  --    < > 8.9   < > 9.5 9.2 9.2  MG 1.8  --  1.8  --   --  1.6* 1.8  PHOS 4.4  --   --   --   --   --   --    < > = values in this interval not displayed.   Recent Labs    04/23/20 1019 03/14/21 1724  AST 19 24  ALT 19 17  ALKPHOS  --  76  BILITOT  --  0.9  PROT  --  7.6  ALBUMIN  --  4.1   Recent Labs    05/05/20 1055 03/14/21 1724 03/14/21 1734 03/15/21 0605  WBC 12.7* 10.3  --  10.4  NEUTROABS  --  6.2  --   --   HGB 14.5 15.3* 16.7* 14.6  HCT 43.8 44.5 49.0* 41.2  MCV 81.6 79.9*  --  78.9*  PLT 434* 448*  --  357   Lab Results  Component Value Date   TSH 1.53 06/06/2019   Lab Results  Component Value Date   HGBA1C 7.8 (H) 04/23/2020   Lab Results  Component Value Date   CHOL 189 04/23/2020   HDL 69.70 04/23/2020   LDLCALC 86 04/23/2020   TRIG 165.0 (H) 04/23/2020   CHOLHDL 3 04/23/2020    Significant Diagnostic Results in last 30 days:  CT Head Wo Contrast  Result Date: 03/14/2021 CLINICAL DATA:  Syncope, fall. EXAM: CT HEAD WITHOUT  CONTRAST TECHNIQUE: Contiguous axial images were obtained from the base of the skull through the vertex without intravenous contrast. COMPARISON:  May 05, 2020. FINDINGS: Brain: Mild chronic ischemic white matter disease is noted. No mass effect or midline shift is noted. Ventricular size is within normal limits. There is no evidence of mass lesion, hemorrhage or acute infarction. Vascular: No hyperdense vessel or unexpected calcification. Skull: Normal. Negative for fracture or focal lesion. Sinuses/Orbits: No acute finding. Other: None. IMPRESSION: No acute intracranial abnormality seen. Electronically Signed   By: Lupita RaiderJames  Green Jr M.D.   On: 03/14/2021 18:28    Assessment/Plan  1. Hyponatremia Lab Results  Component Value Date   NA 131 (L) 03/18/2021   K 3.6 03/18/2021   CO2 25 03/18/2021   GLUCOSE 137 (H) 03/18/2021   BUN 7 (L) 03/18/2021   CREATININE 0.45 03/18/2021   CALCIUM 9.2 03/18/2021   GFRNONAA >60 03/18/2021   GFRAA >60 05/05/2020   -  was given IV fuids in the hospital and Na has improved  2. Type 2 diabetes mellitus without complication, without long-term current use of insulin (HCC) Lab Results  Component Value Date   HGBA1C 7.8 (H) 04/23/2020   -   Continue metformin 1000 mg daily -   Continue CBG checks  3. Essential hypertension -   Continue losartan 100 mg daily and amlodipine 5 mg daily -    monitor BPs  4. Late effect of CVA (RLE weakness) -   Continue aspirin EC 81 mg daily and rosuvastatin 5 mg daily -    for PT and OT,  for therapeutic strengthening exercises  5. Anxiety with depression -   crying, continue fluoxetine 20 mg daily and clonazepam 0.5 mg twice a day PRN -   Psych consult  6. Hypokalemia Lab Results  Component Value Date   K 3.6 03/18/2021   -  repleted in the hospital  7. Polycythemia Lab Results  Component Value Date   PLT 357 03/15/2021   -   Continue aspirin EC 81 mg daily      Family/ staff Communication:    Discussed plan of care with resident and charge nurse.  Labs/tests ordered:  for BMP and CBC in 1 week  Goals of care:   Short-term care   Kenard Gower, DNP, MSN, FNP-BC [facility] [phone] (Monday-Friday 8:00 a.m. - 5:00 p.m.) [phone] (after hours)

## 2021-03-19 NOTE — Chronic Care Management (AMB) (Signed)
  Chronic Care Management   Outreach Note  03/19/2021 Name: Rebecca Barnes MRN: [id number] DOB: [birth year]  Rebecca Barnes is a 75 y.o. year old female who is a primary care patient of [name], DO. I reached out to [name] by phone today in response to a referral sent by [name]'s PCP [name], DO     An unsuccessful telephone outreach was attempted today. The patient was referred to the case management team for assistance with care management and care coordination.   Follow Up Plan: A HIPAA compliant phone message was left for the patient providing contact information and requesting a return call.  If patient returns call to provider office, please advise to call Embedded Care Management Care Guide Demoni Gergen at [phone]  Burman Nieves, CCMA Care Guide, Embedded Care Coordination [facility]  Care Management  Direct Dial: [phone]

## 2021-03-20 ENCOUNTER — Encounter: Payer: Self-pay | Admitting: Internal Medicine

## 2021-03-20 ENCOUNTER — Non-Acute Institutional Stay (SKILLED_NURSING_FACILITY): Payer: Medicare Other | Admitting: Internal Medicine

## 2021-03-20 DIAGNOSIS — E871 Hypo-osmolality and hyponatremia: Secondary | ICD-10-CM | POA: Diagnosis not present

## 2021-03-20 DIAGNOSIS — D751 Secondary polycythemia: Secondary | ICD-10-CM | POA: Diagnosis not present

## 2021-03-20 DIAGNOSIS — E1351 Other specified diabetes mellitus with diabetic peripheral angiopathy without gangrene: Secondary | ICD-10-CM

## 2021-03-20 DIAGNOSIS — Z8673 Personal history of transient ischemic attack (TIA), and cerebral infarction without residual deficits: Secondary | ICD-10-CM | POA: Insufficient documentation

## 2021-03-20 DIAGNOSIS — I10 Essential (primary) hypertension: Secondary | ICD-10-CM

## 2021-03-20 DIAGNOSIS — D75839 Thrombocytosis, unspecified: Secondary | ICD-10-CM | POA: Diagnosis not present

## 2021-03-20 DIAGNOSIS — B373 Candidiasis of vulva and vagina: Secondary | ICD-10-CM

## 2021-03-20 DIAGNOSIS — B3731 Acute candidiasis of vulva and vagina: Secondary | ICD-10-CM | POA: Insufficient documentation

## 2021-03-20 NOTE — Assessment & Plan Note (Addendum)
Peak plat # [id number]  But this normalized ASA prophylaxis in context of non hemorrhagic CVA

## 2021-03-20 NOTE — Progress Notes (Signed)
NURSING HOME LOCATION:  [facility] ROOM NUMBER:  123  CODE STATUS:  Full Code  PCP:  Luana Shu DO  This is a comprehensive admission note to this SNFperformed on this date less than 30 days from date of admission. Included are preadmission medical/surgical history; reconciled medication list; family history; social history and comprehensive review of systems.  Corrections and additions to the records were documented. Comprehensive physical exam was also performed. Additionally a clinical summary was entered for each active diagnosis pertinent to this admission in the Problem List to enhance continuity of care.  HPI: Patient was hospitalized 7/23 - 03/18/2021 with dizziness and vision loss.  The latter was described as "everything whiting out".  This was associated with a partially averted fall at her assisted living complex.   CT scan of the head 7/23 revealed mild chronic ischemic white matter disease compared to May 05, 2020 CT.  MRI was not performed. Patient states that she has had several CVAs but also states she is never seen a Neurologist. Clinical dehydration was clinically present with documented hyponatremia with nadir of 122.  With IV fluids sodium level improved to 131. She has a history of anxiety; fluoxetine was initially held but then resumed.  Clonazepam 0.5 mg twice daily as needed was prescribed as well. She has a history of type 2 diabetes with a past history of CVA.  Metformin 500 mg twice daily and carbohydrate modified diet were prescribed Because of the dizziness ,losartan was initially held but restarted prior to discharge. She did exhibit polycythemia and thrombocytosis for which daily aspirin prophylaxis was recommended.  Past medical and surgical history:includes history of asthma, essential hypertension, history of stroke with residual right lower extremity weakness, dyslipidemia, and diabetes with neurovascular  complications. Surgeries include cholecystectomy.  Social history: Nondrinker; never smoked.  She is a retired Wellsite geologist. She has advanced degrees in Social Work & Fine Arts.  Family history: Reviewed   Review of systems: She validates only the visual disturbance but no other neuro or cardiac prodrome prior to the fall.  She states that she has had several CVAs but denies ever having seen a Neurologist.  She believes her blood pressure may average 150/60.  She is not monitoring her diabetes at home but said the rare occasion she does check at the fasting glucose ranges 120-130.  She describes excessive thirst and urination with nocturia and incontinence.  She validates the new groin rash "from lying in urine at the hospital". Speech therapy states that she scored 14 out of 15 on the BIMS.  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  Constitutional: No fever, significant weight change, fatigue  Eyes: No redness, discharge, pain ENT/mouth: No nasal congestion, purulent discharge, earache, change in hearing, sore throat  Cardiovascular: No chest pain, palpitations, paroxysmal nocturnal dyspnea, claudication, edema  Respiratory: No cough, sputum production, hemoptysis, DOE, significant snoring, apnea Gastrointestinal: No heartburn, dysphagia, abdominal pain, nausea /vomiting, rectal bleeding, melena, change in bowels Genitourinary: No dysuria, hematuria, pyuria Musculoskeletal: No joint stiffness, joint swelling, weakness, pain Neurologic: No dizziness, headache, syncope, seizures, numbness, tingling Psychiatric: No significant anxiety, depression, insomnia, anorexia Endocrine: No change in hair/skin/nails Hematologic/lymphatic: No significant bruising, lymphadenopathy, abnormal bleeding Allergy/immunology: No itchy/watery eyes, significant sneezing, urticaria, angioedema  Physical exam:  Pertinent or positive findings: She  is morbidly obese.  She is a poor historian with seeming memory lapses.  Responses are  delayed.  Speech therapy did describe labile emotions with easy crying.  She did not initially remember hyponatremia as a factor , but stated that she has been hospitalized with a stroke.  When I mentioned the low sodium she made the comment that "oh yes, he was fixated on that".  There is a slight rhonchorous character to her expirations posteriorly.  Heart sounds are distant and rhythm is clinically slightly irregular.  Abdomen is protuberant.  Pedal pulses are decreased.  She has 3/4+ edema at the sock line.  The left extremities are stronger than the right.  The right lower extremity is the weakest.  She has excoriations over the upper back.  I could not initiate urticaria by scratching the skin.  General appearance: no acute distress, increased work of breathing is present.   Lymphatic: No lymphadenopathy about the head, neck, axilla. Eyes: No conjunctival inflammation or lid edema is present. There is no scleral icterus. Ears:  External ear exam shows no significant lesions or deformities.   Nose:  External nasal examination shows no deformity or inflammation. Nasal mucosa are pink and moist without lesions, exudates Oral exam: Lips and gums are healthy appearing.There is no oropharyngeal erythema or exudate. Neck:  No thyromegaly, masses, tenderness noted.    Heart:  No gallop, murmur, click, rub.  Lungs: without wheezes, rales, rubs. Abdomen: Bowel sounds are normal.  Abdomen is soft and nontender with no organomegaly, hernias, masses. GU: Deferred  Extremities:  No cyanosis, clubbing Neurologic exam:  Balance, Rhomberg, finger to nose testing could not be completed due to clinical state Skin: Warm & dry w/o tenting. No significant lesions  See clinical summary under each active problem in the Problem List with associated updated therapeutic plan

## 2021-03-20 NOTE — Assessment & Plan Note (Signed)
On Nystatin topically Short course Diflucan Update A1c

## 2021-03-20 NOTE — Assessment & Plan Note (Signed)
H/H 16.7/49 resolved w IVF

## 2021-03-20 NOTE — Assessment & Plan Note (Signed)
No current A1c: A1c 7.8% on 04/23/2020 A1c goal :< 8% No hypoglycemia Update A1c

## 2021-03-20 NOTE — Assessment & Plan Note (Signed)
BP controlled; no change in antihypertensive medications  

## 2021-03-20 NOTE — Assessment & Plan Note (Signed)
Neurology assessment indicated to define etiology of the CVAs and to dictate prophylactic therapy.

## 2021-03-20 NOTE — Assessment & Plan Note (Signed)
Monitor Na+; SIADH workup if recurrent & progressive

## 2021-03-20 NOTE — Patient Instructions (Signed)
See assessment and plan under each diagnosis in the problem list and acutely for this visit 

## 2021-03-21 LAB — HEMOGLOBIN A1C: Hemoglobin A1C: 6.7

## 2021-03-23 ENCOUNTER — Encounter: Payer: Self-pay | Admitting: Adult Health

## 2021-03-23 ENCOUNTER — Non-Acute Institutional Stay (SKILLED_NURSING_FACILITY): Payer: Medicare Other | Admitting: Adult Health

## 2021-03-23 DIAGNOSIS — E119 Type 2 diabetes mellitus without complications: Secondary | ICD-10-CM | POA: Diagnosis not present

## 2021-03-23 DIAGNOSIS — I10 Essential (primary) hypertension: Secondary | ICD-10-CM

## 2021-03-23 DIAGNOSIS — L03314 Cellulitis of groin: Secondary | ICD-10-CM | POA: Diagnosis not present

## 2021-03-23 DIAGNOSIS — Z8673 Personal history of transient ischemic attack (TIA), and cerebral infarction without residual deficits: Secondary | ICD-10-CM | POA: Diagnosis not present

## 2021-03-23 NOTE — Progress Notes (Signed)
Location:  [facility] Room Number: 123 A Place of Service:  SNF (31) Provider:  Kenard Gower, DNP, FNP-BC  Patient Care Team: Rebecca Mam, DO as PCP - General (Family Medicine)  Extended Emergency Contact Information Primary Emergency Contact: Credit-GROSS,[name] Home Phone: [phone] Relation: Daughter  Code Status:  FULL CODE  Goals of care: Advanced Directive information Advanced Directives 03/23/2021  Does Patient Have a Medical Advance Directive? No  Would patient like information on creating a medical advance directive? -     Chief Complaint  Patient presents with   Acute Visit    Possible shingles    HPI:  Pt is a 75 y.o. female seen today for an acute visit for possible shingles. She was seen in the room today. She has erythematous rashes on her bilateral groin, pubic area, abdominal folds and sacral area. She was treated with Diflucan 50 mg PO X 1 and currently on Nystatin powder BID for Yeast infection. She denies pruritus. No reported fever. Noted to have yellowish slough on left groin area. SBPs ranging from 107 to 144. She takes Amlodipine 5 mg daily and Losartan 100 mg daily for hypertension.   She was admitted to [facility] on 03/18/21 post hospitalization 03/14/21 to 03/18/21 for hyponatremia, Na 122. She was given IV fluids which improved her Na level to 131. She is currently having short-term rehabilitation.    Past Medical History:  Diagnosis Date   Anxiety    Asthma    Diabetes mellitus without complication (HCC)    Hypertension    Stroke [facility])    Past Surgical History:  Procedure Laterality Date   arm fracture     arm surgery   CHOLECYSTECTOMY      Allergies  Allergen Reactions   Sitagliptin     Other reaction(s): DIZZINESS. (JANUVIA)   Sulfa Antibiotics     (SULFA)    Outpatient Encounter Medications as of 03/23/2021  Medication Sig   acetaminophen (TYLENOL) 325 MG tablet Take  650 mg by mouth every 6 (six) hours as needed for mild pain or headache.   amLODipine (NORVASC) 5 MG tablet Take 1 tablet (5 mg total) by mouth daily.   aspirin EC 81 MG tablet Take 81 mg by mouth daily. Swallow whole.   bisacodyl (DULCOLAX) 10 MG suppository as needed. If not relieved by MOM, give 10 mg Bisacodyl suppositiory rectally X 1 dose in 24 hours as needed (Do not use constipation standing orders for residents with renal failure/CFR less than 30. Contact MD for orders)   clonazePAM (KLONOPIN) 0.5 MG tablet Take 1 tablet (0.5 mg total) by mouth 2 (two) times daily as needed for anxiety.   FLUoxetine (PROZAC) 20 MG capsule TAKE 1 CAPSULE(20 MG) BY MOUTH DAILY (Patient taking differently: Take 20 mg by mouth daily.)   losartan (COZAAR) 100 MG tablet Take 1 tablet (100 mg total) by mouth daily.   magnesium hydroxide (MILK OF MAGNESIA) 400 MG/5ML suspension If no BM in 3 days, give 30 cc Milk of Magnesium p.o. x 1 dose in 24 hours as needed (Do not use standing constipation orders for residents with renal failure CFR less than 30. Contact MD for orders)   metFORMIN (GLUCOPHAGE XR) 500 MG 24 hr tablet Take 2 tablets (1,000 mg total) by mouth daily with breakfast. (Patient taking differently: Take 500 mg by mouth 2 (two) times daily.)   Multiple Vitamin (MULTI-VITAMIN) tablet Take 1 tablet by mouth daily.   nystatin cream (  MYCOSTATIN) Apply 1 application topically 4 (four) times daily. Apply topically to bilateral groin, labia, left hip and abdominal folds 4 times daily   Nystatin POWD by Does not apply route 2 (two) times daily. Apply to groin, buttocks   oxyCODONE (OXY IR/ROXICODONE) 5 MG immediate release tablet Take 1 tablet (5 mg total) by mouth every 6 (six) hours as needed for moderate pain or severe pain.   rosuvastatin (CRESTOR) 5 MG tablet Take 1 tablet (5 mg total) by mouth daily. (Patient taking differently: Take 5 mg by mouth every evening.)   Sodium Phosphates (RA SALINE ENEMA RE)  Place rectally. If not relieved by Biscodyl suppository, give disposable Saline Enema rectally X 1 dose/24 hrs as needed (Do not use constipation standing orders for residents with renal failure/CFR less than 30. Contact MD for orders   No facility-administered encounter medications on file as of 03/23/2021.    Review of Systems  GENERAL: No change in appetite, no fatigue, no weight changes, no fever or chills  MOUTH and THROAT: Denies oral discomfort, gingival pain or bleeding RESPIRATORY: no cough, SOB, DOE, wheezing, hemoptysis CARDIAC: No chest pain, edema or palpitations GI: No abdominal pain, diarrhea, constipation, heart burn, nausea or vomiting GU: Denies dysuria, frequency, hematuria or discharge NEUROLOGICAL: Denies dizziness, syncope, numbness, or headache PSYCHIATRIC: Denies feelings of depression or anxiety. No report of hallucinations, insomnia, paranoia, or agitation   Immunization History  Administered Date(s) Administered   Fluad Quad(high Dose 65+) 06/06/2019   Influenza-Unspecified 04/16/2017, 05/08/2020   Janssen (J&J) SARS-COV-2 Vaccination 11/04/2019   Pneumococcal Conjugate-13 09/17/2016   Pneumococcal Polysaccharide-23 02/11/2009, 07/06/2018   Tdap 09/17/2016   Pertinent  Health Maintenance Due  Topic Date Due   MAMMOGRAM  Never done   FOOT EXAM  09/17/2020   HEMOGLOBIN A1C  10/21/2020   INFLUENZA VACCINE  03/23/2021   OPHTHALMOLOGY EXAM  04/16/2021   DEXA SCAN  Completed   PNA vac Low Risk Adult  Completed   Fall Risk  04/23/2020 02/13/2020 06/06/2019  Falls in the past year? 1 0 1  Number falls in past yr: 0 0 1  Injury with Fall? 1 0 1  Risk for fall due to : - History of fall(s) -  Follow up - Falls prevention discussed -     Vitals:   03/23/21 1514  BP: 109/63  Pulse: 98  Resp: 20  Temp: 97.7 F (36.5 C)  Height: 5\' 5"  (1.651 m)   Body mass index is 38.11 kg/m.  Physical Exam  GENERAL APPEARANCE: Well nourished. In no acute distress.  Morbidly obese. SKIN:  see HPI MOUTH and THROAT: Lips are without lesions. Oral mucosa is moist and without lesions.  RESPIRATORY: Breathing is even & unlabored, BS CTAB CARDIAC: RRR, no murmur,no extra heart sounds GI: Abdomen soft, normal BS, no masses, no tenderness EXTREMITIES:  Able to move X 4 extremities. NEUROLOGICAL: There is no tremor. Speech is clear.Alert and oriented X 3.  PSYCHIATRIC:  Affect and behavior are appropriate  Labs reviewed: Recent Labs    03/14/21 2349 03/15/21 0605 03/16/21 0700 03/16/21 1042 03/16/21 1806 03/17/21 0704 03/18/21 0553  NA  --    < > 122*   < > 124* 130* 131*  K  --    < > 6.2*   < > 3.8 3.9 3.6  CL  --    < > 90*   < > 93* 97* 97*  CO2  --    < > 21*   < >  22 24 25   GLUCOSE  --    < > 134*   < > 138* 131* 137*  BUN  --    < > 7*   < > 9 7* 7*  CREATININE  --    < > 0.43*   < > 0.46 0.41* 0.45  CALCIUM  --    < > 8.9   < > 9.5 9.2 9.2  MG 1.8  --  1.8  --   --  1.6* 1.8  PHOS 4.4  --   --   --   --   --   --    < > = values in this interval not displayed.   Recent Labs    04/23/20 1019 03/14/21 1724  AST 19 24  ALT 19 17  ALKPHOS  --  76  BILITOT  --  0.9  PROT  --  7.6  ALBUMIN  --  4.1   Recent Labs    05/05/20 1055 03/14/21 1724 03/14/21 1734 03/15/21 0605  WBC 12.7* 10.3  --  10.4  NEUTROABS  --  6.2  --   --   HGB 14.5 15.3* 16.7* 14.6  HCT 43.8 44.5 49.0* 41.2  MCV 81.6 79.9*  --  78.9*  PLT 434* 448*  --  357   Lab Results  Component Value Date   TSH 1.53 06/06/2019   Lab Results  Component Value Date   HGBA1C 7.8 (H) 04/23/2020   Lab Results  Component Value Date   CHOL 189 04/23/2020   HDL 69.70 04/23/2020   LDLCALC 86 04/23/2020   TRIG 165.0 (H) 04/23/2020   CHOLHDL 3 04/23/2020    Significant Diagnostic Results in last 30 days:  CT Head Wo Contrast  Result Date: 03/14/2021 CLINICAL DATA:  Syncope, fall. EXAM: CT HEAD WITHOUT CONTRAST TECHNIQUE: Contiguous axial images were obtained from  the base of the skull through the vertex without intravenous contrast. COMPARISON:  May 05, 2020. FINDINGS: Brain: Mild chronic ischemic white matter disease is noted. No mass effect or midline shift is noted. Ventricular size is within normal limits. There is no evidence of mass lesion, hemorrhage or acute infarction. Vascular: No hyperdense vessel or unexpected calcification. Skull: Normal. Negative for fracture or focal lesion. Sinuses/Orbits: No acute finding. Other: None. IMPRESSION: No acute intracranial abnormality seen. Electronically Signed   By: May 07, 2020 M.D.   On: 03/14/2021 18:28    Assessment/Plan  1. Cellulitis of groin -  will start Cephalexin 500 mg PO QID X 7 days and Florastor 250 mg PO BID X 10 days -  will need to keep area clean and dry  2. Type 2 diabetes mellitus without complication, without long-term current use of insulin (HCC) Lab Results  Component Value Date   HGBA1C 7.8 (H) 04/23/2020   -   continue Metformin ER 1,000 mg daily and CBG checks   3. Essential hypertension -  BPs stable, continue Losartan 100 mg daily and Amlodipine 5 mg daily -  monitor BPs  4. History of CVA (cerebrovascular accident) -   stable. Continue Rosuvastatin 5 mg daily and ASA EC 81 mg daily      Family/ staff Communication:  Discussed plan of care with resident and charge nurse.  Labs/tests ordered:  None  Goals of care:   Short-term care   06/23/2020, DNP, MSN, FNP-BC [facility] [phone] (Monday-Friday 8:00 a.m. - 5:00 p.m.) [phone] (after hours)

## 2021-03-24 ENCOUNTER — Encounter: Payer: Self-pay | Admitting: Adult Health

## 2021-03-24 ENCOUNTER — Non-Acute Institutional Stay (SKILLED_NURSING_FACILITY): Payer: Medicare Other | Admitting: Adult Health

## 2021-03-24 DIAGNOSIS — R42 Dizziness and giddiness: Secondary | ICD-10-CM

## 2021-03-24 DIAGNOSIS — Z7189 Other specified counseling: Secondary | ICD-10-CM

## 2021-03-24 DIAGNOSIS — L03314 Cellulitis of groin: Secondary | ICD-10-CM | POA: Diagnosis not present

## 2021-03-24 DIAGNOSIS — I10 Essential (primary) hypertension: Secondary | ICD-10-CM

## 2021-03-24 DIAGNOSIS — E119 Type 2 diabetes mellitus without complications: Secondary | ICD-10-CM | POA: Diagnosis not present

## 2021-03-24 DIAGNOSIS — F339 Major depressive disorder, recurrent, unspecified: Secondary | ICD-10-CM

## 2021-03-24 DIAGNOSIS — B372 Candidiasis of skin and nail: Secondary | ICD-10-CM

## 2021-03-24 NOTE — Progress Notes (Signed)
Location:  [facility] Room Number: 123-A Place of Service:  SNF (31) Provider:  Kenard GowerMedina-Vargas, Rebecca Staggs, DNP, FNP-BC  Patient Care Team: Overton Mamirigliano, Mary K, DO as PCP - General (Family Medicine)  Extended Emergency Contact Information Primary Emergency Contact: Gadbois-GROSS,[name] Home Phone: [phone] Relation: Daughter  Code Status: Full code   Goals of care: Advanced Directive information Advanced Directives 03/24/2021  Does Patient Have a Medical Advance Directive? No  Would patient like information on creating a medical advance directive? No - Patient declined     Chief Complaint  Patient presents with   Medical Management of Chronic Issues    Care plan meeting.    HPI:  Pt is a 75 y.o. female who had a care plan meeting attended by resident, PT aide. OT aide, Child psychotherapistsocial worker, NP and Denny PeonErin, daughter, who attended via teleconference. She remains to be full Code. Discussed medications, vital signs and weights. Patient requested to have tea bags which daughter is willing to bring and put at bedside. Daughter stated that she gets dizzy when she takes Rosuvastatin. She is currently taking Rosuvastatin 5 mg daily for hyperlipidemia. Daughter stated that patient started having rashes after having allergy to diaper. Patient [facility] to [facility] with rashes on bilateral groin, supra pubic area and sacral area. Noted area to have spots of yellowish slough. Area is erythematous. Patient is complaining that her skin rashes sticks on the diaper. She is currently treated with Nystatin powder QID. CBGs ranging from 128 to 169. She takes Metformin ER 1,000 mg daily for diabetes mellitus. The meeting lasted for 20 minutes.   Past Medical History:  Diagnosis Date   Anxiety    Asthma    Diabetes mellitus without complication (HCC)    Hypertension    Stroke [facility](HCC)    Past Surgical History:  Procedure Laterality Date   arm fracture     arm surgery   CHOLECYSTECTOMY       Allergies  Allergen Reactions   Sitagliptin     Other reaction(s): DIZZINESS. (JANUVIA)   Sulfa Antibiotics     (SULFA)    Outpatient Encounter Medications as of 03/24/2021  Medication Sig   acetaminophen (TYLENOL) 325 MG tablet Take 650 mg by mouth every 6 (six) hours as needed for mild pain or headache.   amLODipine (NORVASC) 5 MG tablet Take 1 tablet (5 mg total) by mouth daily.   aspirin EC 81 MG tablet Take 81 mg by mouth daily. Swallow whole.   bisacodyl (DULCOLAX) 10 MG suppository as needed. If not relieved by MOM, give 10 mg Bisacodyl suppositiory rectally X 1 dose in 24 hours as needed (Do not use constipation standing orders for residents with renal failure/CFR less than 30. Contact MD for orders)   cephALEXin (KEFLEX) 500 MG capsule Take 500 mg by mouth 4 (four) times daily. for cellulits   clonazePAM (KLONOPIN) 0.5 MG tablet Take 1 tablet (0.5 mg total) by mouth 2 (two) times daily as needed for anxiety.   FLUoxetine (PROZAC) 20 MG capsule TAKE 1 CAPSULE(20 MG) BY MOUTH DAILY (Patient taking differently: No sig reported)   losartan (COZAAR) 100 MG tablet Take 1 tablet (100 mg total) by mouth daily.   magnesium hydroxide (MILK OF MAGNESIA) 400 MG/5ML suspension If no BM in 3 days, give 30 cc Milk of Magnesium p.o. x 1 dose in 24 hours as needed (Do not use standing constipation orders for residents with renal failure CFR less than 30. Contact MD for orders)  metFORMIN (GLUCOPHAGE XR) 500 MG 24 hr tablet Take 2 tablets (1,000 mg total) by mouth daily with breakfast. (Patient taking differently: Take 500 mg by mouth 2 (two) times daily.)   Multiple Vitamin (MULTI-VITAMIN) tablet Take 1 tablet by mouth daily.   NON FORMULARY DIET: REGULAR CCD, THIN LIQUIDS   nystatin cream (MYCOSTATIN) Apply 1 application topically 4 (four) times daily. Apply topically to bilateral groin, labia, left hip and abdominal folds 4 times daily   Nystatin POWD by Does not apply route 2 (two) times  daily. Apply to groin, buttocks   oxyCODONE (OXY IR/ROXICODONE) 5 MG immediate release tablet Take 1 tablet (5 mg total) by mouth every 6 (six) hours as needed for moderate pain or severe pain.   saccharomyces boulardii (FLORASTOR) 250 MG capsule Take 250 mg by mouth 2 (two) times daily. for 10 days   Sodium Phosphates (RA SALINE ENEMA RE) Place rectally. If not relieved by Biscodyl suppository, give disposable Saline Enema rectally X 1 dose/24 hrs as needed (Do not use constipation standing orders for residents with renal failure/CFR less than 30. Contact MD for orders   [DISCONTINUED] rosuvastatin (CRESTOR) 5 MG tablet Take 1 tablet (5 mg total) by mouth daily. (Patient taking differently: Take 5 mg by mouth every evening.)   No facility-administered encounter medications on file as of 03/24/2021.    Review of Systems  GENERAL: No change in appetite, no fatigue, no weight changes, no fever or chills  SKIN: +rashes MOUTH and THROAT: Denies oral discomfort, gingival pain or bleeding RESPIRATORY: no cough, SOB, DOE, wheezing, hemoptysis CARDIAC: No chest pain, edema or palpitations GI: No abdominal pain, diarrhea, constipation, heart burn, nausea or vomiting GU: Denies dysuria, frequency, hematuria or discharge NEUROLOGICAL: Denies dizziness, syncope, numbness, or headache PSYCHIATRIC: Denies feelings of depression or anxiety. No report of hallucinations, insomnia, paranoia, or agitation   Immunization History  Administered Date(s) Administered   Fluad Quad(high Dose 65+) 06/06/2019   Influenza-Unspecified 04/16/2017, 05/08/2020   Janssen (J&J) SARS-COV-2 Vaccination 11/04/2019   Pneumococcal Conjugate-13 09/17/2016   Pneumococcal Polysaccharide-23 02/11/2009, 07/06/2018   Tdap 09/17/2016   Pertinent  Health Maintenance Due  Topic Date Due   MAMMOGRAM  Never done   FOOT EXAM  09/17/2020   INFLUENZA VACCINE  03/23/2021   OPHTHALMOLOGY EXAM  04/16/2021   HEMOGLOBIN A1C  09/21/2021    DEXA SCAN  Completed   PNA vac Low Risk Adult  Completed   Fall Risk  04/23/2020 02/13/2020 06/06/2019  Falls in the past year? 1 0 1  Number falls in past yr: 0 0 1  Injury with Fall? 1 0 1  Risk for fall due to : - History of fall(s) -  Follow up - Falls prevention discussed -     Vitals:   03/24/21 1307  BP: 108/60  Pulse: 100  Resp: (!) 21  Temp: 97.7 F (36.5 C)  Weight: 229 lb (103.9 kg)  Height: 5\' 5"  (1.651 m)   Body mass index is 38.11 kg/m.  Physical Exam  GENERAL APPEARANCE: Well nourished. In no acute distress. Morbidly obese. SKIN:  see HPI MOUTH and THROAT: Lips are without lesions. Oral mucosa is moist and without lesions.  RESPIRATORY: Breathing is even & unlabored, BS CTAB CARDIAC: RRR, no murmur,no extra heart sounds, no edema GI: Abdomen soft, normal BS, no masses, no tenderness EXTREMITIES:  Able to move X 4 extremities NEUROLOGICAL: There is no tremor. Speech is clear. Alert and oriented X 3. PSYCHIATRIC:  Affect and behavior  are appropriate  Labs reviewed: Recent Labs    03/14/21 2349 03/15/21 0605 03/16/21 0700 03/16/21 1042 03/16/21 1806 03/17/21 0704 03/18/21 0553  NA  --    < > 122*   < > 124* 130* 131*  K  --    < > 6.2*   < > 3.8 3.9 3.6  CL  --    < > 90*   < > 93* 97* 97*  CO2  --    < > 21*   < > 22 24 25   GLUCOSE  --    < > 134*   < > 138* 131* 137*  BUN  --    < > 7*   < > 9 7* 7*  CREATININE  --    < > 0.43*   < > 0.46 0.41* 0.45  CALCIUM  --    < > 8.9   < > 9.5 9.2 9.2  MG 1.8  --  1.8  --   --  1.6* 1.8  PHOS 4.4  --   --   --   --   --   --    < > = values in this interval not displayed.   Recent Labs    04/23/20 1019 03/14/21 1724  AST 19 24  ALT 19 17  ALKPHOS  --  76  BILITOT  --  0.9  PROT  --  7.6  ALBUMIN  --  4.1   Recent Labs    05/05/20 1055 03/14/21 1724 03/14/21 1734 03/15/21 0605  WBC 12.7* 10.3  --  10.4  NEUTROABS  --  6.2  --   --   HGB 14.5 15.3* 16.7* 14.6  HCT 43.8 44.5 49.0* 41.2  MCV  81.6 79.9*  --  78.9*  PLT 434* 448*  --  357   Lab Results  Component Value Date   TSH 1.53 06/06/2019   Lab Results  Component Value Date   HGBA1C 6.7 03/21/2021   Lab Results  Component Value Date   CHOL 189 04/23/2020   HDL 69.70 04/23/2020   LDLCALC 86 04/23/2020   TRIG 165.0 (H) 04/23/2020   CHOLHDL 3 04/23/2020    Significant Diagnostic Results in last 30 days:  CT Head Wo Contrast  Result Date: 03/14/2021 CLINICAL DATA:  Syncope, fall. EXAM: CT HEAD WITHOUT CONTRAST TECHNIQUE: Contiguous axial images were obtained from the base of the skull through the vertex without intravenous contrast. COMPARISON:  May 05, 2020. FINDINGS: Brain: Mild chronic ischemic white matter disease is noted. No mass effect or midline shift is noted. Ventricular size is within normal limits. There is no evidence of mass lesion, hemorrhage or acute infarction. Vascular: No hyperdense vessel or unexpected calcification. Skull: Normal. Negative for fracture or focal lesion. Sinuses/Orbits: No acute finding. Other: None. IMPRESSION: No acute intracranial abnormality seen. Electronically Signed   By: May 07, 2020 M.D.   On: 03/14/2021 18:28    Assessment/Plan  1. Advance care planning -  remains to be full code -  discussed medications, vital signs and weights  2. Cellulitis of groin -  will start on Cephalexin 500 mg QID -  Keep skin clean and dry  3. Type 2 diabetes mellitus without complication, without long-term current use of insulin (HCC) Lab Results  Component Value Date   HGBA1C 6.7 03/21/2021   -  continue metformin and CBG checks  4. Essential hypertension -  BPs stable, continue amlodipine and losartan  5. Depression, recurrent (HCC) -  cries when talking, continue fluoxetine -   Referred for psych consult  6. Candidal skin infection -  will change nystatin powder to nystatin ointment topically to rashes QID  7.  Dizziness -   will discontinue rosuvastatin due to  intolerance    Family/ staff Communication:   Discussed plan of care.  Resident, daughter and IDT.  Labs/tests ordered:    None  Goals of care:   Short-term care   Kenard Gower, DNP, MSN, FNP-BC [facility] [phone] (Monday-Friday 8:00 a.m. - 5:00 p.m.) [phone] (after hours)

## 2021-03-26 NOTE — Chronic Care Management (AMB) (Signed)
  Chronic Care Management   Note  03/26/2021 Name: Rebecca Barnes MRN: [id number] DOB: May 11, 1946  [name] is a 75 y.o. year old female who is a primary care patient of Ronnald Nian, DO. I reached out to Karoline Caldwell by phone today in response to a referral sent by Ms. Keren Kolton's PCP Ronnald Nian, DO     [name] was given information about Chronic Care Management services today including:  CCM service includes personalized support from designated clinical staff supervised by her physician, including individualized plan of care and coordination with other care providers 24/7 contact phone numbers for assistance for urgent and routine care needs. Service will only be billed when office clinical staff spend 20 minutes or more in a month to coordinate care. Only one practitioner may furnish and bill the service in a calendar month. The patient may stop CCM services at any time (effective at the end of the month) by phone call to the office staff. The patient will be responsible for cost sharing (co-pay) of up to 20% of the service fee (after annual deductible is met).  Patient agreed to services and verbal consent obtained.   Follow up plan: Telephone appointment with care management team member scheduled for: 04/15/2021  Julian Hy, [facility]  Direct Dial: [phone]

## 2021-04-03 ENCOUNTER — Ambulatory Visit: Payer: Medicare Other | Admitting: Podiatry

## 2021-04-06 ENCOUNTER — Encounter: Payer: Self-pay | Admitting: Adult Health

## 2021-04-06 ENCOUNTER — Non-Acute Institutional Stay (SKILLED_NURSING_FACILITY): Payer: Medicare Other | Admitting: Adult Health

## 2021-04-06 DIAGNOSIS — E119 Type 2 diabetes mellitus without complications: Secondary | ICD-10-CM

## 2021-04-06 DIAGNOSIS — I10 Essential (primary) hypertension: Secondary | ICD-10-CM | POA: Diagnosis not present

## 2021-04-06 DIAGNOSIS — L0390 Cellulitis, unspecified: Secondary | ICD-10-CM | POA: Diagnosis not present

## 2021-04-06 NOTE — Progress Notes (Signed)
Location:  [facility] Room Number: 123 A Place of Service:  SNF (31) Provider:  Kenard Gower, DNP, FNP-BC  Patient Care Team: [name], DO as PCP - General (Family Medicine) Otho Ket, RN as [facility] Care Management  Extended Emergency Contact Information Primary Emergency Contact: Cohill-GROSS,[name] Home Phone: [phone] Relation: Daughter  Code Status:  FULL CODE  Goals of care: Advanced Directive information Advanced Directives 04/06/2021  Does Patient Have a Medical Advance Directive? No  Would patient like information on creating a medical advance directive? -     Chief Complaint  Patient presents with   Acute Visit    Short term rehabilitation    HPI:  Pt is a 75 y.o. female seen today for medical management of chronic diseases. She is currently having PT and OT. Noted rashes has cleared on bilateral groin and suprapubic area. Noted to still have open wounds/rashes on left lower back. Yellowish slough noted on the open wounds. She has recently completed Cephalexin 500 mg QID X 7 days and tapering dose of Prednisone.. No reported fever nor chills.  SBPs ranging from 120 to 139. She takes losartan 100 mg daily and amlodipine 5 mg daily for hypertension.     Past Medical History:  Diagnosis Date   Anxiety    Asthma    Diabetes mellitus without complication (HCC)    Hypertension    Stroke [facility])    Past Surgical History:  Procedure Laterality Date   arm fracture     arm surgery   CHOLECYSTECTOMY      Allergies  Allergen Reactions   Sitagliptin     Other reaction(s): DIZZINESS. (JANUVIA)   Sulfa Antibiotics     (SULFA)    Outpatient Encounter Medications as of 04/06/2021  Medication Sig   acetaminophen (TYLENOL) 325 MG tablet Take 650 mg by mouth every 6 (six) hours as needed for mild pain or headache.   amLODipine (NORVASC) 5 MG tablet Take 1 tablet (5 mg total) by mouth daily.   aspirin EC  81 MG tablet Take 81 mg by mouth daily. Swallow whole.   bisacodyl (DULCOLAX) 10 MG suppository as needed. If not relieved by MOM, give 10 mg Bisacodyl suppositiory rectally X 1 dose in 24 hours as needed (Do not use constipation standing orders for residents with renal failure/CFR less than 30. Contact MD for orders)   FLUoxetine (PROZAC) 20 MG capsule TAKE 1 CAPSULE(20 MG) BY MOUTH DAILY (Patient taking differently: No sig reported)   losartan (COZAAR) 100 MG tablet Take 1 tablet (100 mg total) by mouth daily.   magnesium hydroxide (MILK OF MAGNESIA) 400 MG/5ML suspension If no BM in 3 days, give 30 cc Milk of Magnesium p.o. x 1 dose in 24 hours as needed (Do not use standing constipation orders for residents with renal failure CFR less than 30. Contact MD for orders)   melatonin 5 MG TABS Take 5 mg by mouth.   metFORMIN (GLUCOPHAGE XR) 500 MG 24 hr tablet Take 2 tablets (1,000 mg total) by mouth daily with breakfast. (Patient taking differently: Take 500 mg by mouth 2 (two) times daily.)   Multiple Vitamin (MULTI-VITAMIN) tablet Take 1 tablet by mouth daily.   NON FORMULARY DIET: REGULAR CCD, THIN LIQUIDS   nystatin cream (MYCOSTATIN) Apply 1 application topically 4 (four) times daily. Apply topically to bilateral groin, labia, left hip and abdominal folds 4 times daily   nystatin ointment (MYCOSTATIN) Apply 1 application topically 2 (two)  times daily.   oxyCODONE (OXY IR/ROXICODONE) 5 MG immediate release tablet Take 1 tablet (5 mg total) by mouth every 6 (six) hours as needed for moderate pain or severe pain.   sodium chloride 1 g tablet Take 1 g by mouth in the morning and at bedtime.   Sodium Phosphates (RA SALINE ENEMA RE) Place rectally. If not relieved by Biscodyl suppository, give disposable Saline Enema rectally X 1 dose/24 hrs as needed (Do not use constipation standing orders for residents with renal failure/CFR less than 30. Contact MD for orders   [DISCONTINUED] cephALEXin (KEFLEX) 500  MG capsule Take 500 mg by mouth 4 (four) times daily. for cellulits   [DISCONTINUED] clonazePAM (KLONOPIN) 0.5 MG tablet Take 1 tablet (0.5 mg total) by mouth 2 (two) times daily as needed for anxiety.   [DISCONTINUED] Nystatin POWD by Does not apply route 2 (two) times daily. Apply to groin, buttocks   [DISCONTINUED] saccharomyces boulardii (FLORASTOR) 250 MG capsule Take 250 mg by mouth 2 (two) times daily. for 10 days   No facility-administered encounter medications on file as of 04/06/2021.    Review of Systems  GENERAL: No change in appetite, no fatigue, no weight changes, no fever, chills  MOUTH and THROAT: Denies oral discomfort, gingival pain or bleeding RESPIRATORY: no cough, SOB, DOE, wheezing, hemoptysis CARDIAC: No chest pain, edema or palpitations GI: No abdominal pain, diarrhea, constipation, heart burn, nausea or vomiting GU: Denies dysuria, frequency, hematuria or discharge NEUROLOGICAL: Denies dizziness, syncope, numbness, or headache PSYCHIATRIC: Denies feelings of depression or anxiety. No report of hallucinations, insomnia, paranoia, or agitation   Immunization History  Administered Date(s) Administered   Fluad Quad(high Dose 65+) 06/06/2019   Influenza-Unspecified 04/16/2017, 05/08/2020   Janssen (J&J) SARS-COV-2 Vaccination 11/04/2019   Pneumococcal Conjugate-13 09/17/2016   Pneumococcal Polysaccharide-23 02/11/2009, 07/06/2018   Tdap 09/17/2016   Pertinent  Health Maintenance Due  Topic Date Due   MAMMOGRAM  Never done   FOOT EXAM  09/17/2020   INFLUENZA VACCINE  03/23/2021   OPHTHALMOLOGY EXAM  04/16/2021   HEMOGLOBIN A1C  09/21/2021   DEXA SCAN  Completed   PNA vac Low Risk Adult  Completed   Fall Risk  04/23/2020 02/13/2020 06/06/2019  Falls in the past year? 1 0 1  Number falls in past yr: 0 0 1  Injury with Fall? 1 0 1  Risk for fall due to : - History of fall(s) -  Follow up - Falls prevention discussed -     Vitals:   04/06/21 1154  BP:  120/72  Pulse: 98  Resp: 20  Temp: 97.7 F (36.5 C)  Height: 5\' 5"  (1.651 m)   Body mass index is 38.11 kg/m.  Physical Exam  GENERAL APPEARANCE: Well nourished. In no acute distress. Morbidly obese. SKIN:  Left lower back with open wounds/rashes and slough MOUTH and THROAT: Lips are without lesions. Oral mucosa is moist and without lesions.  RESPIRATORY: Breathing is even & unlabored, BS CTAB CARDIAC: RRR, no murmur,no extra heart sounds, no edema GI: Abdomen soft, normal BS, no masses, no tenderness EXTREMITIES:  Able to move X 4 extremities NEUROLOGICAL: There is no tremor. Speech is clear. Alert and oriented X 3. PSYCHIATRIC:  Affect and behavior are appropriate  Labs reviewed: Recent Labs    03/14/21 2349 03/15/21 0605 03/16/21 0700 03/16/21 1042 03/16/21 1806 03/17/21 0704 03/18/21 0553  NA  --    < > 122*   < > 124* 130* 131*  K  --    < >  6.2*   < > 3.8 3.9 3.6  CL  --    < > 90*   < > 93* 97* 97*  CO2  --    < > 21*   < > 22 24 25   GLUCOSE  --    < > 134*   < > 138* 131* 137*  BUN  --    < > 7*   < > 9 7* 7*  CREATININE  --    < > 0.43*   < > 0.46 0.41* 0.45  CALCIUM  --    < > 8.9   < > 9.5 9.2 9.2  MG 1.8  --  1.8  --   --  1.6* 1.8  PHOS 4.4  --   --   --   --   --   --    < > = values in this interval not displayed.   Recent Labs    04/23/20 1019 03/14/21 1724  AST 19 24  ALT 19 17  ALKPHOS  --  76  BILITOT  --  0.9  PROT  --  7.6  ALBUMIN  --  4.1   Recent Labs    05/05/20 1055 03/14/21 1724 03/14/21 1734 03/15/21 0605  WBC 12.7* 10.3  --  10.4  NEUTROABS  --  6.2  --   --   HGB 14.5 15.3* 16.7* 14.6  HCT 43.8 44.5 49.0* 41.2  MCV 81.6 79.9*  --  78.9*  PLT 434* 448*  --  357   Lab Results  Component Value Date   TSH 1.53 06/06/2019   Lab Results  Component Value Date   HGBA1C 6.7 03/21/2021   Lab Results  Component Value Date   CHOL 189 04/23/2020   HDL 69.70 04/23/2020   LDLCALC 86 04/23/2020   TRIG 165.0 (H) 04/23/2020    CHOLHDL 3 04/23/2020    Significant Diagnostic Results in last 30 days:  CT Head Wo Contrast  Result Date: 03/14/2021 CLINICAL DATA:  Syncope, fall. EXAM: CT HEAD WITHOUT CONTRAST TECHNIQUE: Contiguous axial images were obtained from the base of the skull through the vertex without intravenous contrast. COMPARISON:  May 05, 2020. FINDINGS: Brain: Mild chronic ischemic white matter disease is noted. No mass effect or midline shift is noted. Ventricular size is within normal limits. There is no evidence of mass lesion, hemorrhage or acute infarction. Vascular: No hyperdense vessel or unexpected calcification. Skull: Normal. Negative for fracture or focal lesion. Sinuses/Orbits: No acute finding. Other: None. IMPRESSION: No acute intracranial abnormality seen. Electronically Signed   By: May 07, 2020 M.D.   On: 03/14/2021 18:28    Assessment/Plan  1. Cellulitis, unspecified cellulitis site -  will start Doxycycline 100 mg PO BID X 1 week and Florastor 250 mg BID X 10 days -  keep area clean and dry  2. Essential hypertension -  BP stable, continue Losartan and Amlodipine  3. Type 2 diabetes mellitus without complication, without long-term current use of insulin (HCC) Lab Results  Component Value Date   HGBA1C 6.7 03/21/2021   -   CBGs stable, continue Metformin and CBG  checks   Family/ staff Communication:   Discussed plan of care with resident and charge nurse.  Labs/tests ordered:   None  Goals of care:  Short-term care   03/23/2021, DNP, MSN, FNP-BC [facility] [phone] (Monday-Friday 8:00 a.m. - 5:00 p.m.) [phone] (after hours)

## 2021-04-08 ENCOUNTER — Encounter: Payer: Self-pay | Admitting: Adult Health

## 2021-04-08 ENCOUNTER — Non-Acute Institutional Stay (SKILLED_NURSING_FACILITY): Payer: Medicare Other | Admitting: Adult Health

## 2021-04-08 DIAGNOSIS — I10 Essential (primary) hypertension: Secondary | ICD-10-CM | POA: Diagnosis not present

## 2021-04-08 DIAGNOSIS — E871 Hypo-osmolality and hyponatremia: Secondary | ICD-10-CM

## 2021-04-08 DIAGNOSIS — E119 Type 2 diabetes mellitus without complications: Secondary | ICD-10-CM | POA: Diagnosis not present

## 2021-04-08 DIAGNOSIS — L0390 Cellulitis, unspecified: Secondary | ICD-10-CM | POA: Diagnosis not present

## 2021-04-08 DIAGNOSIS — B372 Candidiasis of skin and nail: Secondary | ICD-10-CM

## 2021-04-08 DIAGNOSIS — F418 Other specified anxiety disorders: Secondary | ICD-10-CM

## 2021-04-08 DIAGNOSIS — F339 Major depressive disorder, recurrent, unspecified: Secondary | ICD-10-CM

## 2021-04-08 DIAGNOSIS — Z8673 Personal history of transient ischemic attack (TIA), and cerebral infarction without residual deficits: Secondary | ICD-10-CM

## 2021-04-08 DIAGNOSIS — F5101 Primary insomnia: Secondary | ICD-10-CM

## 2021-04-08 NOTE — Progress Notes (Signed)
Location:  [facility] Room Number: 1230A Place of Service:  SNF (31) Provider:  Kenard Gower, DNP, FNP-BC  Patient Care Team: [name], DO as PCP - General (Family Medicine) Otho Ket, RN as [facility] Care Management  Extended Emergency Contact Information Primary Emergency Contact: Homesley-GROSS,[name] Home Phone: [phone] Relation: Daughter  Code Status:  FULL CODE  Goals of care: Advanced Directive information Advanced Directives 04/08/2021  Does Patient Have a Medical Advance Directive? No  Would patient like information on creating a medical advance directive? -     Chief Complaint  Patient presents with   Discharge Note    For discharge home on 04/09/21 with Home health PT, OT and Nursing     HPI:  Pt is a 75 y.o. female who is for discharge to [facility] on 04/09/21 with Home Health PT, OT and Nursing.  She was admitted to [facility] on 03/18/21 post hospital admission 03/14/21 to 03/18/21. She has a PMH of anxiety, asthma, type 2 diabetes mellitus, hypertension and history of other nonhemorrhagic CVA with residual RLE weakness. She was brought to the ED with complaints of dizziness with vision changes on the morning of her hospital admission. She stated that "everything whiting out" resulting in a partially averted fall while she was at her ALF. ED lab work showed clinical dehydration with hyponatremia, Na 122. She was given IV fluids and her Na levels improved to 131.    At [facility], she was admitted with rashes on her bilateral groin, pubic area, abdominal folds and sacral area which was treated with X 1 dose of Diflucan 50 mg PO X 1 and Nystatin ointment, diagnosed to have Candida of skin. Left lower back was noted to have open wounds and diagnosed to have cellulitis. She was treated with Cephalexin 500 mg QID X 7 days and shifted to Doxycycline 100 mg BID X 7 days and short course of  Prednisone. Noted to have improvement. Left lower back wounds are now dry and no erythema.  Patient was admitted to this facility for short-term rehabilitation after the patient's recent hospitalization.  Patient has completed SNF rehabilitation and therapy has cleared the patient for discharge.    Past Medical History:  Diagnosis Date   Anxiety    Asthma    Diabetes mellitus without complication (HCC)    Hypertension    Stroke [facility])    Past Surgical History:  Procedure Laterality Date   arm fracture     arm surgery   CHOLECYSTECTOMY      Allergies  Allergen Reactions   Sitagliptin     Other reaction(s): DIZZINESS. (JANUVIA)   Sulfa Antibiotics     (SULFA)    Outpatient Encounter Medications as of 04/08/2021  Medication Sig   acetaminophen (TYLENOL) 325 MG tablet Take 650 mg by mouth every 6 (six) hours as needed for mild pain or headache.   bisacodyl (DULCOLAX) 10 MG suppository as needed. If not relieved by MOM, give 10 mg Bisacodyl suppositiory rectally X 1 dose in 24 hours as needed (Do not use constipation standing orders for residents with renal failure/CFR less than 30. Contact MD for orders)   magnesium hydroxide (MILK OF MAGNESIA) 400 MG/5ML suspension If no BM in 3 days, give 30 cc Milk of Magnesium p.o. x 1 dose in 24 hours as needed (Do not use standing constipation orders for residents with renal failure CFR less than 30. Contact MD for orders)   NON  FORMULARY DIET: REGULAR CCD, THIN LIQUIDS   Sodium Phosphates (RA SALINE ENEMA RE) Place rectally. If not relieved by Biscodyl suppository, give disposable Saline Enema rectally X 1 dose/24 hrs as needed (Do not use constipation standing orders for residents with renal failure/CFR less than 30. Contact MD for orders   [DISCONTINUED] nystatin cream (MYCOSTATIN) Apply 1 application topically 4 (four) times daily. Apply topically to bilateral groin, labia, left hip and abdominal folds 4 times daily   [DISCONTINUED] sodium  chloride 1 g tablet Take 1 g by mouth in the morning and at bedtime.   amLODipine (NORVASC) 5 MG tablet Take 1 tablet (5 mg total) by mouth daily.   aspirin EC 81 MG tablet Take 1 tablet (81 mg total) by mouth daily. Swallow whole.   doxycycline (DORYX) 100 MG EC tablet Take 1 tablet (100 mg total) by mouth 2 (two) times daily for 7 days. For 10 days   FLUoxetine (PROZAC) 20 MG capsule TAKE 1 CAPSULE(20 MG) BY MOUTH DAILY   losartan (COZAAR) 100 MG tablet Take 1 tablet (100 mg total) by mouth daily.   melatonin 5 MG TABS Take 1 tablet (5 mg total) by mouth at bedtime.   metFORMIN (GLUCOPHAGE XR) 500 MG 24 hr tablet Take 2 tabs = 1,000 mg daily   Multiple Vitamin (MULTI-VITAMIN) tablet Take 1 tablet by mouth daily.   nystatin ointment (MYCOSTATIN) Apply 1 application topically 2 (two) times daily. Apply topically to bilateral groin, labia, left hip and abdominal folds 4 times daily   oxyCODONE (OXY IR/ROXICODONE) 5 MG immediate release tablet Take 1 tablet (5 mg total) by mouth every 6 (six) hours as needed for moderate pain or severe pain.   saccharomyces boulardii (FLORASTOR) 250 MG capsule Take 1 capsule (250 mg total) by mouth 2 (two) times daily for 10 days.   sodium chloride 1 g tablet Take 1 tablet (1 g total) by mouth 3 (three) times daily.   [DISCONTINUED] amLODipine (NORVASC) 5 MG tablet Take 1 tablet (5 mg total) by mouth daily.   [DISCONTINUED] aspirin EC 81 MG tablet Take 81 mg by mouth daily. Swallow whole.   [DISCONTINUED] doxycycline (DORYX) 100 MG EC tablet Take 100 mg by mouth 2 (two) times daily. For 10 days   [DISCONTINUED] FLUoxetine (PROZAC) 20 MG capsule TAKE 1 CAPSULE(20 MG) BY MOUTH DAILY (Patient taking differently: No sig reported)   [DISCONTINUED] losartan (COZAAR) 100 MG tablet Take 1 tablet (100 mg total) by mouth daily.   [DISCONTINUED] melatonin 5 MG TABS Take 5 mg by mouth.   [DISCONTINUED] metFORMIN (GLUCOPHAGE XR) 500 MG 24 hr tablet Take 2 tablets (1,000 mg  total) by mouth daily with breakfast. (Patient taking differently: Take 500 mg by mouth 2 (two) times daily.)   [DISCONTINUED] Multiple Vitamin (MULTI-VITAMIN) tablet Take 1 tablet by mouth daily.   [DISCONTINUED] nystatin ointment (MYCOSTATIN) Apply 1 application topically 2 (two) times daily.   [DISCONTINUED] oxyCODONE (OXY IR/ROXICODONE) 5 MG immediate release tablet Take 1 tablet (5 mg total) by mouth every 6 (six) hours as needed for moderate pain or severe pain.   [DISCONTINUED] saccharomyces boulardii (FLORASTOR) 250 MG capsule Take 250 mg by mouth 2 (two) times daily.   No facility-administered encounter medications on file as of 04/08/2021.    Review of Systems  GENERAL: No fever or chills  MOUTH and THROAT: Denies oral discomfort, gingival pain or bleeding RESPIRATORY: no cough, SOB, DOE, wheezing, hemoptysis CARDIAC: No chest pain, edema or palpitations GI: No abdominal pain,  diarrhea, constipation, heart burn, nausea or vomiting GU: Denies dysuria, frequency, hematuria or discharge NEUROLOGICAL: Denies dizziness, syncope, numbness, or headache PSYCHIATRIC: Denies feelings of depression or anxiety. No report of hallucinations, insomnia, paranoia, or agitation   Immunization History  Administered Date(s) Administered   Fluad Quad(high Dose 65+) 06/06/2019   Influenza-Unspecified 04/16/2017, 05/08/2020   Janssen (J&J) SARS-COV-2 Vaccination 11/04/2019   Pneumococcal Conjugate-13 09/17/2016   Pneumococcal Polysaccharide-23 02/11/2009, 07/06/2018   Tdap 09/17/2016   Pertinent  Health Maintenance Due  Topic Date Due   MAMMOGRAM  Never done   FOOT EXAM  09/17/2020   INFLUENZA VACCINE  03/23/2021   OPHTHALMOLOGY EXAM  04/16/2021   HEMOGLOBIN A1C  09/21/2021   DEXA SCAN  Completed   PNA vac Low Risk Adult  Completed   Fall Risk  04/23/2020 02/13/2020 06/06/2019  Falls in the past year? 1 0 1  Number falls in past yr: 0 0 1  Injury with Fall? 1 0 1  Risk for fall due to : -  History of fall(s) -  Follow up - Falls prevention discussed -     Vitals:   04/08/21 1300  BP: 112/64  Pulse: 100  Resp: 18  Temp: 98.2 F (36.8 C)  Weight: 216 lb 9.6 oz (98.2 kg)  Height: 5\' 5"  (1.651 m)   Body mass index is 36.04 kg/m.  Physical Exam  GENERAL APPEARANCE: Well nourished. In no acute distress. Obese. SKIN:  small open wounds on left lower back, dry and no erythema MOUTH and THROAT: Lips are without lesions. Oral mucosa is moist and without lesions.  RESPIRATORY: Breathing is even & unlabored, BS CTAB CARDIAC: RRR, no murmur,no extra heart sounds, no edema GI: Abdomen soft, normal BS, no masses, no tenderness NEUROLOGICAL: There is no tremor. Speech is clear. Alert and oriented X 3. PSYCHIATRIC:  Affect and behavior are appropriate  Labs reviewed: Recent Labs    03/14/21 2349 03/15/21 0605 03/16/21 0700 03/16/21 1042 03/16/21 1806 03/17/21 0704 03/18/21 0553  NA  --    < > 122*   < > 124* 130* 131*  K  --    < > 6.2*   < > 3.8 3.9 3.6  CL  --    < > 90*   < > 93* 97* 97*  CO2  --    < > 21*   < > 22 24 25   GLUCOSE  --    < > 134*   < > 138* 131* 137*  BUN  --    < > 7*   < > 9 7* 7*  CREATININE  --    < > 0.43*   < > 0.46 0.41* 0.45  CALCIUM  --    < > 8.9   < > 9.5 9.2 9.2  MG 1.8  --  1.8  --   --  1.6* 1.8  PHOS 4.4  --   --   --   --   --   --    < > = values in this interval not displayed.   Recent Labs    04/23/20 1019 03/14/21 1724  AST 19 24  ALT 19 17  ALKPHOS  --  76  BILITOT  --  0.9  PROT  --  7.6  ALBUMIN  --  4.1   Recent Labs    05/05/20 1055 03/14/21 1724 03/14/21 1734 03/15/21 0605  WBC 12.7* 10.3  --  10.4  NEUTROABS  --  6.2  --   --  HGB 14.5 15.3* 16.7* 14.6  HCT 43.8 44.5 49.0* 41.2  MCV 81.6 79.9*  --  78.9*  PLT 434* 448*  --  357   Lab Results  Component Value Date   TSH 1.53 06/06/2019   Lab Results  Component Value Date   HGBA1C 6.7 03/21/2021   Lab Results  Component Value Date   CHOL  189 04/23/2020   HDL 69.70 04/23/2020   LDLCALC 86 04/23/2020   TRIG 165.0 (H) 04/23/2020   CHOLHDL 3 04/23/2020    Significant Diagnostic Results in last 30 days:  CT Head Wo Contrast  Result Date: 03/14/2021 CLINICAL DATA:  Syncope, fall. EXAM: CT HEAD WITHOUT CONTRAST TECHNIQUE: Contiguous axial images were obtained from the base of the skull through the vertex without intravenous contrast. COMPARISON:  May 05, 2020. FINDINGS: Brain: Mild chronic ischemic white matter disease is noted. No mass effect or midline shift is noted. Ventricular size is within normal limits. There is no evidence of mass lesion, hemorrhage or acute infarction. Vascular: No hyperdense vessel or unexpected calcification. Skull: Normal. Negative for fracture or focal lesion. Sinuses/Orbits: No acute finding. Other: None. IMPRESSION: No acute intracranial abnormality seen. Electronically Signed   By: Lupita Raider M.D.   On: 03/14/2021 18:28    Assessment/Plan  1. Hyponatremia -  Na 129, 04/08/21 - sodium chloride 1 g tablet; Take 1 tablet (1 g total) by mouth 3 (three) times daily.  Dispense: 90 tablet; Refill: 0  2. Cellulitis, unspecified cellulitis site - doxycycline (DORYX) 100 MG EC tablet; Take 1 tablet (100 mg total) by mouth 2 (two) times daily for 7 days. For 10 days  Dispense: 14 tablet; Refill: 0 - saccharomyces boulardii (FLORASTOR) 250 MG capsule; Take 1 capsule (250 mg total) by mouth 2 (two) times daily for 10 days.  Dispense: 20 capsule; Refill: 0  3. Type 2 diabetes mellitus without complication, without long-term current use of insulin (HCC) Lab Results  Component Value Date   HGBA1C 6.7 03/21/2021   - metFORMIN (GLUCOPHAGE XR) 500 MG 24 hr tablet; Take 2 tabs = 1,000 mg daily  Dispense: 60 tablet; Refill: 0  4. Essential hypertension - amLODipine (NORVASC) 5 MG tablet; Take 1 tablet (5 mg total) by mouth daily.  Dispense: 30 tablet; Refill: 0 - losartan (COZAAR) 100 MG tablet; Take 1  tablet (100 mg total) by mouth daily.  Dispense: 30 tablet; Refill: 0  5. History of CVA (cerebrovascular accident) - aspirin EC 81 MG tablet; Take 1 tablet (81 mg total) by mouth daily. Swallow whole.  Dispense: 30 tablet; Refill: 0  6. Depression, recurrent (HCC) - FLUoxetine (PROZAC) 20 MG capsule; TAKE 1 CAPSULE(20 MG) BY MOUTH DAILY  Dispense: 30 capsule; Refill: 0  7. Candidal skin infection - nystatin ointment (MYCOSTATIN); Apply 1 application topically 2 (two) times daily. Apply topically to bilateral groin, labia, left hip and abdominal folds 4 times daily  Dispense: 30 g; Refill: 0  8. Primary insomnia - melatonin 5 MG TABS; Take 1 tablet (5 mg total) by mouth at bedtime.  Dispense: 30 tablet; Refill: 0     I have filled out patient's discharge paperwork and written prescriptions.  Patient will have home health PT, OT and Nursing.  DME provided:  None  Total discharge time: Greater than 30 minutes Greater than 50% was spent in counseling and coordination of care.   Discharge time involved coordination of the discharge process with social worker, nursing staff and therapy department. Medical justification for  home health service verified.    Kenard Gower, DNP, MSN, FNP-BC [facility] [phone] (Monday-Friday 8:00 a.m. - 5:00 p.m.) [phone] (after hours)

## 2021-04-09 ENCOUNTER — Other Ambulatory Visit: Payer: Self-pay | Admitting: Adult Health

## 2021-04-09 DIAGNOSIS — I10 Essential (primary) hypertension: Secondary | ICD-10-CM

## 2021-04-09 MED ORDER — LOSARTAN POTASSIUM 100 MG PO TABS: 100.0000 mg | ORAL_TABLET | Freq: Every day | ORAL | 0 refills | Status: DC

## 2021-04-09 MED ORDER — OXYCODONE HCL 5 MG PO TABS: 5.0000 mg | ORAL_TABLET | Freq: Four times a day (QID) | ORAL | 0 refills | Status: DC | PRN

## 2021-04-09 MED ORDER — MELATONIN 5 MG PO TABS: 5.0000 mg | ORAL_TABLET | Freq: Every day | ORAL | 0 refills | Status: DC

## 2021-04-09 MED ORDER — NYSTATIN 100000 UNIT/GM EX OINT: 1.0000 "application " | TOPICAL_OINTMENT | Freq: Two times a day (BID) | CUTANEOUS | 0 refills | Status: DC

## 2021-04-09 MED ORDER — DOXYCYCLINE HYCLATE 100 MG PO TBEC: 100.0000 mg | DELAYED_RELEASE_TABLET | Freq: Two times a day (BID) | ORAL | 0 refills | Status: AC

## 2021-04-09 MED ORDER — ASPIRIN EC 81 MG PO TBEC: 81.0000 mg | DELAYED_RELEASE_TABLET | Freq: Every day | ORAL | 0 refills | Status: DC

## 2021-04-09 MED ORDER — MULTI-VITAMIN PO TABS: 1.0000 | ORAL_TABLET | Freq: Every day | ORAL | 0 refills | Status: DC

## 2021-04-09 MED ORDER — SACCHAROMYCES BOULARDII 250 MG PO CAPS: 250.0000 mg | ORAL_CAPSULE | Freq: Two times a day (BID) | ORAL | 0 refills | Status: AC

## 2021-04-09 MED ORDER — AMLODIPINE BESYLATE 5 MG PO TABS: 5.0000 mg | ORAL_TABLET | Freq: Every day | ORAL | 0 refills | Status: DC

## 2021-04-09 MED ORDER — METFORMIN HCL ER 500 MG PO TB24: ORAL_TABLET | ORAL | 0 refills | Status: DC

## 2021-04-09 MED ORDER — SODIUM CHLORIDE 1 G PO TABS: 1.0000 g | ORAL_TABLET | Freq: Three times a day (TID) | ORAL | 0 refills | Status: DC

## 2021-04-09 MED ORDER — FLUOXETINE HCL 20 MG PO CAPS: ORAL_CAPSULE | ORAL | 0 refills | Status: DC

## 2021-04-11 ENCOUNTER — Other Ambulatory Visit: Payer: Self-pay | Admitting: Adult Health

## 2021-04-11 DIAGNOSIS — E119 Type 2 diabetes mellitus without complications: Secondary | ICD-10-CM

## 2021-04-14 ENCOUNTER — Telehealth: Payer: Self-pay | Admitting: Family Medicine

## 2021-04-14 NOTE — Telephone Encounter (Signed)
Verbal orders given  

## 2021-04-14 NOTE — Telephone Encounter (Signed)
[facility] calling needing verbal orders for pt, PT for 3 times a week for 1 week and then twice a week for 3 weeks and then once a week for 5 weeks Call back [phone]

## 2021-04-15 ENCOUNTER — Ambulatory Visit: Payer: Medicare Other | Admitting: Family

## 2021-04-15 ENCOUNTER — Telehealth: Payer: Self-pay | Admitting: *Deleted

## 2021-04-15 ENCOUNTER — Telehealth: Payer: Self-pay

## 2021-04-15 ENCOUNTER — Telehealth: Payer: Medicare Other

## 2021-04-15 NOTE — Telephone Encounter (Signed)
  Care Management   Follow Up Note   04/15/2021 Name: Ellenie Salome MRN: [id number] DOB: Dec 30, 1945   Referred by: [name], DO Reason for referral : Chronic Care Management (Initial assessment)   An unsuccessful telephone outreach was attempted today. The patient was referred to the case management team for assistance with care management and care coordination.  Telephone call to patient.  HIPAA verified. Patient request RNCM talk with her daughter, Eryn Marandola regarding her medical/ health information.  Call attempt to daughter. Unable to reach. HIPAA compliant voice message left with call back phone number and return call request.   Follow Up Plan: A HIPPA compliant phone message was left for the patient providing contact information and requesting a return call.   George Ina RN,BSN,CCM RN Case Manager [place] [phone]

## 2021-04-15 NOTE — Chronic Care Management (AMB) (Signed)
  Care Management   Note  04/15/2021 Name: Braelee Herrle MRN: [id number] DOB: 1946/01/09  Mischele Detter is a 75 y.o. year old female who is a primary care patient of [name], DO and is actively engaged with the care management team. I reached out to [name] by phone today to assist with re-scheduling an initial visit with the RN Case Manager  Follow up plan: Unsuccessful telephone outreach attempt made. A HIPAA compliant phone message was left for the patient providing contact information and requesting a return call.   Burman Nieves, CCMA Care Guide, Embedded Care Coordination [facility]  Care Management  Direct Dial: [phone]

## 2021-04-17 ENCOUNTER — Encounter: Payer: Medicare Other | Admitting: Family

## 2021-04-17 ENCOUNTER — Ambulatory Visit: Payer: Medicare Other | Admitting: Family

## 2021-04-19 NOTE — Progress Notes (Signed)
  This encounter was created in error - please disregard. No show 

## 2021-04-23 NOTE — Chronic Care Management (AMB) (Signed)
  Care Management   Note  04/23/2021 Name: Rebecca Barnes MRN: [id number] DOB: 1946/01/19  [name] is a 75 y.o. year old female who is a primary care patient of [name], DO and is actively engaged with the care management team. I reached out to [name] by phone today to assist with re-scheduling an initial visit with the RN Case Manager  Follow up plan: 2nd attempt Unsuccessful telephone outreach attempt made. A HIPAA compliant phone message was left for the patient providing contact information and requesting a return call.   Burman Nieves, CCMA Care Guide, Embedded Care Coordination [facility]  Care Management  Direct Dial: [phone]

## 2021-04-29 NOTE — Chronic Care Management (AMB) (Signed)
  Care Management   Note  04/29/2021 Name: Rebecca Barnes MRN: [id number] DOB: 05/06/1946  [name] is a 75 y.o. year old female who is a primary care patient of [name], DO and is actively engaged with the care management team. I reached out to [name] by phone today to assist with re-scheduling an initial visit with the RN Case Manager  Follow up plan: Patient declines further follow up and engagement by the care management team. Appropriate care team members and provider have been notified via electronic communication.  [name] says she is no longer with [facility]   Burman Nieves, CCMA Care Guide, Embedded Care Coordination [facility]  Care Management  Direct Dial: [phone]

## 2021-05-01 ENCOUNTER — Other Ambulatory Visit: Payer: Self-pay

## 2021-05-01 ENCOUNTER — Ambulatory Visit (INDEPENDENT_AMBULATORY_CARE_PROVIDER_SITE_OTHER): Payer: Medicare Other | Admitting: Family

## 2021-05-01 ENCOUNTER — Encounter: Payer: Self-pay | Admitting: Family

## 2021-05-01 VITALS — BP 150/80 | HR 103 | Temp 97.8°F | Resp 16 | Ht 65.0 in | Wt 218.0 lb

## 2021-05-01 DIAGNOSIS — F339 Major depressive disorder, recurrent, unspecified: Secondary | ICD-10-CM

## 2021-05-01 DIAGNOSIS — L602 Onychogryphosis: Secondary | ICD-10-CM

## 2021-05-01 DIAGNOSIS — Z8673 Personal history of transient ischemic attack (TIA), and cerebral infarction without residual deficits: Secondary | ICD-10-CM

## 2021-05-01 DIAGNOSIS — Z7689 Persons encountering health services in other specified circumstances: Secondary | ICD-10-CM | POA: Diagnosis not present

## 2021-05-01 DIAGNOSIS — I10 Essential (primary) hypertension: Secondary | ICD-10-CM

## 2021-05-01 DIAGNOSIS — F418 Other specified anxiety disorders: Secondary | ICD-10-CM

## 2021-05-01 DIAGNOSIS — E119 Type 2 diabetes mellitus without complications: Secondary | ICD-10-CM | POA: Diagnosis not present

## 2021-05-01 DIAGNOSIS — I6930 Unspecified sequelae of cerebral infarction: Secondary | ICD-10-CM

## 2021-05-01 DIAGNOSIS — E871 Hypo-osmolality and hyponatremia: Secondary | ICD-10-CM

## 2021-05-01 NOTE — Progress Notes (Signed)
Provider: Marlowe Sax FNP-C   Darica Goren, Nelda Bucks, NP  Patient Care Team: Demica Zook, Nelda Bucks, NP as PCP - General (Family Medicine)  Extended Emergency Contact Information Primary Emergency Contact: Mattera-GROSS,[name] Home Phone: [phone] Relation: Daughter Secondary Emergency Contact: [place] Mobile Phone: [phone] Relation: Daughter  Code Status:  Full Code  Goals of care: Advanced Directive information Advanced Directives 04/08/2021  Does Patient Have a Medical Advance Directive? No  Would patient like information on creating a medical advance directive? -     Chief Complaint  Patient presents with   Establish Care    New Patient.     HPI:  Pt is a 75 y.o. female seen today to [place] with Provider for medical management of chronic diseases. Has a medical histroy of Type 2 DM,Depression with Anxiety,Hx of nonhemorrhagic CVA with RLE weakness,Asthma,Hypertension among others.currently resides at [facility].  She is status post skilled [facility] discharge post hospital admission from 03/14/2021 - 03/18/2021 for dehydration with hyponatremia 122.she was given IVF with improvement to 131 and was transferred to [facility] for short stay Rehabilitation. States feels better after discharged home.she continues work with Home health Physical Therapy twice per week.  B/p slightly elevated today.no home blood pressure readings for review. No CBG readings for review.   Past Medical History:  Diagnosis Date   Anxiety    Asthma    Diabetes mellitus without complication ([place])    Hypertension    Stroke [facility])    Past Surgical History:  Procedure Laterality Date   arm fracture     arm surgery   CHOLECYSTECTOMY      Allergies  Allergen Reactions   Sitagliptin     Other reaction(s): DIZZINESS. (JANUVIA)   Sulfa Antibiotics     (SULFA)    Allergies as of 05/01/2021       Reactions   Sitagliptin    Other reaction(s):  DIZZINESS. (JANUVIA)   Sulfa Antibiotics    (SULFA)        Medication List        Accurate as of May 01, 2021  2:52 PM. If you have any questions, ask your nurse or doctor.          STOP taking these medications    magnesium hydroxide 400 MG/5ML suspension Commonly known as: MILK OF MAGNESIA Stopped by: Sandrea Hughs, NP   NON FORMULARY Stopped by: Sandrea Hughs, NP   nystatin ointment Commonly known as: MYCOSTATIN Stopped by: Nelda Bucks Akshar Starnes, NP   oxyCODONE 5 MG immediate release tablet Commonly known as: Oxy IR/ROXICODONE Stopped by: Sandrea Hughs, NP   RA SALINE ENEMA RE Stopped by: Sandrea Hughs, NP       TAKE these medications    acetaminophen 325 MG tablet Commonly known as: TYLENOL Take 650 mg by mouth every 6 (six) hours as needed for mild pain or headache.   amLODipine 5 MG tablet Commonly known as: NORVASC Take 1 tablet (5 mg total) by mouth daily.   aspirin EC 81 MG tablet Take 1 tablet (81 mg total) by mouth daily. Swallow whole.   bisacodyl 10 MG suppository Commonly known as: DULCOLAX as needed. If not relieved by MOM, give 10 mg Bisacodyl suppositiory rectally X 1 dose in 24 hours as needed (Do not use constipation standing orders for residents with renal failure/CFR less than 30. Contact MD for orders)   FLUoxetine 20 MG capsule Commonly known as: PROZAC TAKE 1 CAPSULE(20 MG)  BY MOUTH DAILY   losartan 100 MG tablet Commonly known as: COZAAR Take 1 tablet (100 mg total) by mouth daily.   Melatonin 10 MG Caps Take 1 capsule by mouth at bedtime. What changed: Another medication with the same name was removed. Continue taking this medication, and follow the directions you see here. Changed by: Sandrea Hughs, NP   metFORMIN 500 MG 24 hr tablet Commonly known as: Glucophage XR Take 2 tabs = 1,000 mg daily   Multi-Vitamin tablet Take 1 tablet by mouth daily.   sodium chloride 1 g tablet Take 1 tablet (1 g total) by  mouth 3 (three) times daily.        Review of Systems  Constitutional:  Negative for appetite change, chills, fatigue, fever and unexpected weight change.  HENT:  Positive for hearing loss. Negative for congestion, dental problem, ear discharge, ear pain, facial swelling, nosebleeds, postnasal drip, rhinorrhea, sinus pressure, sinus pain, sneezing, sore throat, tinnitus and trouble swallowing.   Eyes:  Positive for visual disturbance. Negative for pain, discharge, redness and itching.       Eye glasses  Respiratory:  Negative for cough, chest tightness, shortness of breath and wheezing.   Cardiovascular:  Negative for chest pain, palpitations and leg swelling.  Gastrointestinal:  Negative for abdominal distention, abdominal pain, blood in stool, constipation, diarrhea, nausea and vomiting.       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  Genitourinary:  Positive for frequency. Negative for difficulty urinating, dysuria, flank pain and urgency.       Leaks wears incontinent for prevention   Musculoskeletal:  Positive for gait problem. Negative for arthralgias, back pain, joint swelling, myalgias, neck pain and neck stiffness.  Skin:  Negative for color change, pallor, rash and wound.  Neurological:  Positive for weakness. Negative for dizziness, syncope, speech difficulty, light-headedness, numbness and headaches.       Right leg weakness from stroke   Hematological:  Does not bruise/bleed easily.  Psychiatric/Behavioral:  Positive for sleep disturbance. Negative for agitation, behavioral problems, confusion, hallucinations, self-injury and suicidal ideas. The patient is nervous/anxious.        Depressed Roommate calls out at night and staff come to change roommate    Immunization History  Administered Date(s) Administered   Fluad Quad(high Dose 65+) 06/06/2019   Influenza-Unspecified  04/16/2017, 05/08/2020   Janssen (J&J) SARS-COV-2 Vaccination 11/04/2019   Pneumococcal Conjugate-13 09/17/2016   Pneumococcal Polysaccharide-23 02/11/2009, 07/06/2018   Tdap 09/17/2016   Pertinent  Health Maintenance Due  Topic Date Due   FOOT EXAM  09/17/2020   INFLUENZA VACCINE  03/23/2021   OPHTHALMOLOGY EXAM  04/16/2021   HEMOGLOBIN A1C  09/21/2021   DEXA SCAN  Completed   PNA vac Low Risk Adult  Completed   Fall Risk  04/23/2020 02/13/2020 06/06/2019  Falls in the past year? 1 0 1  Number falls in past yr: 0 0 1  Injury with Fall? 1 0 1  Risk for fall due to : - History of fall(s) -  Follow up - Falls prevention discussed -   Functional Status Survey:    Vitals:   05/01/21 1432  BP: (!) 150/80  Pulse: (!) 103  Resp: 16  Temp: 97.8 F (36.6 C)  SpO2: 98%  Weight: 218 lb (98.9 kg)  Height: 5' 5"  (1.651 m)   Body mass index is 36.28 kg/m. Physical Exam Vitals reviewed.  Constitutional:  General: She is not in acute distress.    Appearance: Normal appearance. She is obese. She is not ill-appearing or diaphoretic.  HENT:     Head: Normocephalic.     Right Ear: Tympanic membrane, ear canal and external ear normal. There is no impacted cerumen.     Left Ear: Tympanic membrane, ear canal and external ear normal. There is no impacted cerumen.     Nose: Nose normal. No congestion or rhinorrhea.     Mouth/Throat:     Mouth: Mucous membranes are moist.     Pharynx: Oropharynx is clear. No oropharyngeal exudate or posterior oropharyngeal erythema.  Eyes:     General: No scleral icterus.       Right eye: No discharge.        Left eye: No discharge.     Extraocular Movements: Extraocular movements intact.     Conjunctiva/sclera: Conjunctivae normal.     Pupils: Pupils are equal, round, and reactive to light.  Neck:     Vascular: No carotid bruit.  Cardiovascular:     Rate and Rhythm: Normal rate and regular rhythm.     Pulses: Normal pulses.     Heart sounds:  Normal heart sounds. No murmur heard.   No friction rub. No gallop.  Pulmonary:     Effort: Pulmonary effort is normal. No respiratory distress.     Breath sounds: Normal breath sounds. No wheezing, rhonchi or rales.  Chest:     Chest wall: No tenderness.  Abdominal:     General: Bowel sounds are normal. There is no distension.     Palpations: Abdomen is soft. There is no mass.     Tenderness: There is no abdominal tenderness. There is no right CVA tenderness, left CVA tenderness, guarding or rebound.  Musculoskeletal:        General: No swelling or tenderness. Normal range of motion.     Cervical back: Normal range of motion. No rigidity or tenderness.     Right lower leg: No edema.     Left lower leg: No edema.     Comments: Steady with a walker   Lymphadenopathy:     Cervical: No cervical adenopathy.  Skin:    General: Skin is warm and dry.     Coloration: Skin is not pale.     Findings: No bruising, erythema, lesion or rash.  Neurological:     Mental Status: She is alert and oriented to person, place, and time.     Cranial Nerves: No cranial nerve deficit.     Sensory: No sensory deficit.     Motor: Weakness present.     Coordination: Coordination normal.     Gait: Gait abnormal.     Comments: Right leg weakness   Psychiatric:        Mood and Affect: Mood normal.        Speech: Speech normal.        Behavior: Behavior normal.        Thought Content: Thought content normal.        Judgment: Judgment normal.    Labs reviewed: Recent Labs    03/14/21 2349 03/15/21 0605 03/16/21 0700 03/16/21 1042 03/16/21 1806 03/17/21 0704 03/18/21 0553  NA  --    < > 122*   < > 124* 130* 131*  K  --    < > 6.2*   < > 3.8 3.9 3.6  CL  --    < > 90*   < >  93* 97* 97*  CO2  --    < > 21*   < > 22 24 25   GLUCOSE  --    < > 134*   < > 138* 131* 137*  BUN  --    < > 7*   < > 9 7* 7*  CREATININE  --    < > 0.43*   < > 0.46 0.41* 0.45  CALCIUM  --    < > 8.9   < > 9.5 9.2 9.2  MG  1.8  --  1.8  --   --  1.6* 1.8  PHOS 4.4  --   --   --   --   --   --    < > = values in this interval not displayed.   Recent Labs    03/14/21 1724  AST 24  ALT 17  ALKPHOS 76  BILITOT 0.9  PROT 7.6  ALBUMIN 4.1   Recent Labs    05/05/20 1055 03/14/21 1724 03/14/21 1734 03/15/21 0605  WBC 12.7* 10.3  --  10.4  NEUTROABS  --  6.2  --   --   HGB 14.5 15.3* 16.7* 14.6  HCT 43.8 44.5 49.0* 41.2  MCV 81.6 79.9*  --  78.9*  PLT 434* 448*  --  357   Lab Results  Component Value Date   TSH 1.53 06/06/2019   Lab Results  Component Value Date   HGBA1C 6.7 03/21/2021   Lab Results  Component Value Date   CHOL 189 04/23/2020   HDL 69.70 04/23/2020   LDLCALC 86 04/23/2020   TRIG 165.0 (H) 04/23/2020   CHOLHDL 3 04/23/2020    Significant Diagnostic Results in last 30 days:  No results found.  Assessment/Plan 1. Hypomagnesemia Mg 1.6  Will recheck level  - Magnesium  2. Type 2 diabetes mellitus without complication, without long-term current use of insulin (HCC) Lab Results  Component Value Date   HGBA1C 6.7 03/21/2021  Continue on metformin  - on ARB for renal protection - continue on ASA and Statin  - Lipid Panel - Ambulatory referral to Podiatry for annual foot examination  3. Essential hypertension B/p not at goal - Advised to check Blood pressure at home and record on log provided and notify provider if B/p > 140/90  Will consider increasing Amlodipine from 5 mg tablet to 10 mg tablet daily  - continue on losartan  - CBC with Differential/Platelet - TSH - CMP with eGFR([facility])  4. Depression, recurrent ([place]) Stressed out from adjusting to ALF sharing room with another resident who calls out loudly at night and require frequent changing by facility staff thus interrupting  her sleep.Daughter states will be moving patient back home once her house is completed. - continue on fluoxetine   5. History of CVA (cerebrovascular accident) Continue on  ASA  6. Hyponatremia Na 122 improved with IVF Will recheck level  - CMP with eGFR([facility])  7. Overgrown toenails - Ambulatory referral to Podiatry  8. Anxiety Continue Fluoxetine   9. Late effect of CVA (RLE weakness) Continue with Physical therapy   10. Establishing care with new doctor, encounter for Advised to get Shingrix and COVID-19 vaccine at her ALF Medication and labs reviewed patient counselled regarding yearly exam, prevention of dental and periodontal disease, diet, regular sustained exercise for at least 30 minutes x 3 /week,COVID-19 hand hygiene, mask and social distancing per CDC guidelines. recommended schedule for routine labs. Fall screening, MMSE and PHQ 9 up  dated.  Family/ staff Communication: Reviewed plan of care with patient and daughter verbalized understanding   Labs/tests ordered:  - Magnesium - CBC with Differential/Platelet - CMP with eGFR([facility]) - TSH - Lipid panel  Next Appointment : 6 months for medical management of chronic issues.  Sandrea Hughs, NP

## 2021-05-02 ENCOUNTER — Telehealth: Payer: Self-pay

## 2021-05-02 LAB — CBC WITH DIFFERENTIAL/PLATELET
Absolute Monocytes: 938 cells/uL (ref 200–950)
Basophils Absolute: 28 cells/uL (ref 0–200)
Basophils Relative: 0.3 %
Eosinophils Absolute: 175 cells/uL (ref 15–500)
Eosinophils Relative: 1.9 %
HCT: 40.1 % (ref 35.0–45.0)
Hemoglobin: 13.4 g/dL (ref 11.7–15.5)
Lymphs Abs: 2622 cells/uL (ref 850–3900)
MCH: 27.7 pg (ref 27.0–33.0)
MCHC: 33.4 g/dL (ref 32.0–36.0)
MCV: 82.9 fL (ref 80.0–100.0)
MPV: 9.7 fL (ref 7.5–12.5)
Monocytes Relative: 10.2 %
Neutro Abs: 5437 cells/uL (ref 1500–7800)
Neutrophils Relative %: 59.1 %
Platelets: 462 10*3/uL — ABNORMAL HIGH (ref 140–400)
RBC: 4.84 10*6/uL (ref 3.80–5.10)
RDW: 13.7 % (ref 11.0–15.0)
Total Lymphocyte: 28.5 %
WBC: 9.2 10*3/uL (ref 3.8–10.8)

## 2021-05-02 LAB — COMPLETE METABOLIC PANEL WITH GFR
AG Ratio: 1.3 (calc) (ref 1.0–2.5)
ALT: 11 U/L (ref 6–29)
AST: 14 U/L (ref 10–35)
Albumin: 3.8 g/dL (ref 3.6–5.1)
Alkaline phosphatase (APISO): 85 U/L (ref 37–153)
BUN: 14 mg/dL (ref 7–25)
CO2: 24 mmol/L (ref 20–32)
Calcium: 9.5 mg/dL (ref 8.6–10.4)
Chloride: 97 mmol/L — ABNORMAL LOW (ref 98–110)
Creat: 0.72 mg/dL (ref 0.60–1.00)
Globulin: 2.9 g/dL (calc) (ref 1.9–3.7)
Glucose, Bld: 138 mg/dL (ref 65–139)
Potassium: 3.6 mmol/L (ref 3.5–5.3)
Sodium: 134 mmol/L — ABNORMAL LOW (ref 135–146)
Total Bilirubin: 0.3 mg/dL (ref 0.2–1.2)
Total Protein: 6.7 g/dL (ref 6.1–8.1)
eGFR: 87 mL/min/{1.73_m2} (ref >=60)

## 2021-05-02 LAB — LIPID PANEL
Cholesterol: 203 mg/dL — ABNORMAL HIGH (ref <200)
HDL: 64 mg/dL (ref >=50)
LDL Cholesterol (Calc): 105 mg/dL (calc) — ABNORMAL HIGH
Non-HDL Cholesterol (Calc): 139 mg/dL (calc) — ABNORMAL HIGH (ref <130)
Total CHOL/HDL Ratio: 3.2 (calc) (ref <5.0)
Triglycerides: 222 mg/dL — ABNORMAL HIGH (ref <150)

## 2021-05-02 LAB — MAGNESIUM: Magnesium: 1.6 mg/dL (ref 1.5–2.5)

## 2021-05-02 LAB — TSH: TSH: 1.64 mIU/L (ref 0.40–4.50)

## 2021-05-02 NOTE — Telephone Encounter (Signed)
LVM for patient to call and have her AWV scheduled.

## 2021-05-06 ENCOUNTER — Other Ambulatory Visit: Payer: Self-pay | Admitting: Adult Health

## 2021-05-06 DIAGNOSIS — I10 Essential (primary) hypertension: Secondary | ICD-10-CM

## 2021-05-06 NOTE — Telephone Encounter (Signed)
Patient has request refill on medication "Amlodipine 5mg ", and "Losartan 100mg ". Patient last refill on both medications was 04/09/2021. Patient has given 30 pills and zero refills. I'm unsure if this is permanent medication for patient or just trial. Patient recently had blood work done on 05/01/2021 with hasnt been resulted. Medication pend and sent to PCP Ngetich, 04/11/2021, NP for approval. Please Advise.

## 2021-05-15 ENCOUNTER — Telehealth: Payer: Self-pay | Admitting: Family Medicine

## 2021-05-15 NOTE — Telephone Encounter (Signed)
Spoke to [place] @ [facility] and advised her that patient seeing Dianh Ngetich. She will call them. Dm/cma

## 2021-05-15 NOTE — Telephone Encounter (Signed)
Corrie Dandy from [facility] is calling to get a call back concerning pt. Pt has a rash on her Left buttocks and is using Cortizone cream. She is wanting a nursing discharge and pt will continue PT. Mary at [facility] at [phone].  I tried telling Corrie Dandy that pt had a TOC on 05/01/21 with Dinah Ngetich at [facility]. She insisted that Dr. Salena Saner had filled the orders out and she would need [place] to contact her.

## 2021-05-26 ENCOUNTER — Other Ambulatory Visit: Payer: Self-pay | Admitting: Adult Health

## 2021-05-26 DIAGNOSIS — E871 Hypo-osmolality and hyponatremia: Secondary | ICD-10-CM

## 2021-07-13 ENCOUNTER — Other Ambulatory Visit: Payer: Self-pay | Admitting: *Deleted

## 2021-07-13 DIAGNOSIS — E119 Type 2 diabetes mellitus without complications: Secondary | ICD-10-CM

## 2021-07-13 DIAGNOSIS — F339 Major depressive disorder, recurrent, unspecified: Secondary | ICD-10-CM

## 2021-07-13 MED ORDER — FLUOXETINE HCL 20 MG PO CAPS: ORAL_CAPSULE | ORAL | 1 refills | Status: DC

## 2021-07-13 MED ORDER — METFORMIN HCL ER 500 MG PO TB24: ORAL_TABLET | ORAL | 1 refills | Status: DC

## 2021-07-13 NOTE — Telephone Encounter (Signed)
Patient daughter called Requesting refill.

## 2021-07-29 ENCOUNTER — Telehealth: Payer: Self-pay | Admitting: *Deleted

## 2021-07-29 NOTE — Telephone Encounter (Signed)
[name], daughter Left message on Clinical intake while at [facility] stating that patient has cold symptoms and [facility] has no orders on file to give patient any Cold medications. Wanting [place] to send orders.   Called and LMOM with daughter to have [facility] fax over Order Assessment of patient's symptoms or we could schedule a Visit/MyChart visit with patient to discuss symptoms and treatment.   Awaiting call back.

## 2021-07-31 ENCOUNTER — Telehealth: Payer: Self-pay

## 2021-07-31 ENCOUNTER — Other Ambulatory Visit: Payer: Self-pay

## 2021-07-31 ENCOUNTER — Telehealth: Payer: Self-pay | Admitting: *Deleted

## 2021-07-31 ENCOUNTER — Telehealth (INDEPENDENT_AMBULATORY_CARE_PROVIDER_SITE_OTHER): Payer: Medicare Other | Admitting: Adult Health

## 2021-07-31 DIAGNOSIS — J209 Acute bronchitis, unspecified: Secondary | ICD-10-CM | POA: Diagnosis not present

## 2021-07-31 MED ORDER — DOXYCYCLINE HYCLATE 100 MG PO TABS: 100.0000 mg | ORAL_TABLET | Freq: Two times a day (BID) | ORAL | 0 refills | Status: AC

## 2021-07-31 MED ORDER — GUAIFENESIN ER 600 MG PO TB12: 600.0000 mg | ORAL_TABLET | Freq: Two times a day (BID) | ORAL | 0 refills | Status: AC

## 2021-07-31 MED ORDER — SACCHAROMYCES BOULARDII 250 MG PO CAPS
250.0000 mg | ORAL_CAPSULE | Freq: Two times a day (BID) | ORAL | 0 refills | Status: AC
Start: 2021-07-31 — End: 2021-08-13

## 2021-07-31 NOTE — Telephone Encounter (Signed)
Appointment scheduled with Monina for MyChart Visit 12/9 at 2:30

## 2021-07-31 NOTE — Telephone Encounter (Signed)
Tried calling patient to start mychart video visit with 4 unsuccessful attempts. I left messages on patient voicemail. The 4th attempt message was left instructing patient to call office to reschedule appointment.  1st attempt-2:15 pm left vm 2nd attempt-2:23 pm left vm 3rd attempt-2:36 pm left vm 4th attempt- 2:54 pm left vm

## 2021-07-31 NOTE — Telephone Encounter (Signed)
Ms. kaniesha, barile are scheduled for a virtual visit with your provider today.    Just as we do with appointments in the office, we must obtain your consent to participate.  Your consent will be active for this visit and any virtual visit you may have with one of our providers in the next 365 days.    If you have a MyChart account, I can also send a copy of this consent to you electronically.  All virtual visits are billed to your insurance company just like a traditional visit in the office.  As this is a virtual visit, video technology does not allow for your provider to perform a traditional examination.  This may limit your provider's ability to fully assess your condition.  If your provider identifies any concerns that need to be evaluated in person or the need to arrange testing such as labs, EKG, etc, we will make arrangements to do so.    Although advances in technology are sophisticated, we cannot ensure that it will always work on either your end or our end.  If the connection with a video visit is poor, we may have to switch to a telephone visit.  With either a video or telephone visit, we are not always able to ensure that we have a secure connection.   I need to obtain your verbal consent now.   Are you willing to proceed with your visit today?   Victoire Deans has provided verbal consent on 07/31/2021 for a virtual visit (video or telephone).   Elveria Royals, [facility] 07/31/2021  3:16 PM

## 2021-07-31 NOTE — Telephone Encounter (Signed)
Patient called and stated that she has a Sinus infection and cough and congestion. Requesting a Antibiotic to be called to pharmacy.   I had Ronda open up Monina's schedule and I called patient back to schedule a MyChart Visit and LMOM to return call.   Awaiting callback to schedule MyChart Visit.

## 2021-07-31 NOTE — Telephone Encounter (Signed)
Patient called and stated that she just had a Mychart visit with patient and Monina requested her to call back with [facility] phone number to let them know medications was sent to pharmacy.   [facility] #: [phone]  I called [facility] and spoke with [place] and instructed her that 3 medications (names given) were called to [facility] on [address].   She stated that she will let someone know to pick them up.

## 2021-07-31 NOTE — Progress Notes (Signed)
This service is provided via telemedicine  No vital signs collected/recorded due to the encounter was a telemedicine visit.   Location of patient (ex: home, work):  Home  Patient consents to a telephone visit:  Yes, see encounter dated 07/31/2021  Location of the provider (ex: office, home):  [facility]  Name of any referring provider:  Ngetich, Dinah, NP  Names of all persons participating in the telemedicine service and their role in the encounter:  Durenda Age, Nurse Practitioner, Carroll Kinds, CMA, and patient.    Time spent on call:  8 minutes with medical assistant      DATE:  07/31/2021 MRN:  [id number]  BIRTHDAY: [birth year]   Contact Information     Name Relation Home Work Mobile   Carruthers-GROSS,[name] Daughter [phone]     Mullins,Lisa Daughter   [phone]        Code Status History     Date Active Date Inactive Code Status Order ID Comments User Context   03/14/2021 2023 03/18/2021 1658 Full Code [id number]  Reubin Milan, MD ED        Chief Complaint  Patient presents with   Acute Visit    Patient complains of sinus infection,cough and congestion. This has been going on for about a couple of days. Patient states she had fever this morning. Throat feeling better. Patient coughs up yellow and brown phlegm. Complains of nasal congestion.    HISTORY OF PRESENT ILLNESS:  Was not able to connect via video visit so connected with patient via telephone.  This is a 75 year old female who had lives in [place]. She had COVID-19 booster and flu shot 3 days ago. She complains of "feeling sick". She stated that her nasal sinuses feels clogged, throat hurts and body aches. This symptoms started 2 days ago. She has productive cough with brownish phlegm. She felt feverish this morning so she took 2 Tylenol tablets. She stated that the ALF staff has to dispense her medications. She cannot take her own medications  because "it is a long story". Daughter will pick up her medications from pharmacy. She has a PMH of asthma, diabetes mellitus, history of CVA and hypertension.   PAST MEDICAL HISTORY:  Past Medical History:  Diagnosis Date   Anxiety    Asthma    Diabetes mellitus without complication ([place])    Hypertension    Stroke [facility])      CURRENT MEDICATIONS: Reviewed  Patient's Medications  New Prescriptions   No medications on file  Previous Medications   ACETAMINOPHEN (TYLENOL) 325 MG TABLET    Take 650 mg by mouth every 6 (six) hours as needed for mild pain or headache.   AMLODIPINE (NORVASC) 5 MG TABLET    TAKE 1 TABLET(5 MG) BY MOUTH DAILY   ASPIRIN EC 81 MG TABLET    Take 1 tablet (81 mg total) by mouth daily. Swallow whole.   BISACODYL (DULCOLAX) 10 MG SUPPOSITORY    as needed. If not relieved by MOM, give 10 mg Bisacodyl suppositiory rectally X 1 dose in 24 hours as needed (Do not use constipation standing orders for residents with renal failure/CFR less than 30. Contact MD for orders)   FLUOXETINE (PROZAC) 20 MG CAPSULE    Take one capsule by mouth once daily.   LOSARTAN (COZAAR) 100 MG TABLET    TAKE 1 TABLET(100 MG) BY MOUTH DAILY   MELATONIN 10 MG CAPS    Take 1 capsule by mouth at bedtime.  METFORMIN (GLUCOPHAGE XR) 500 MG 24 HR TABLET    Take two tablet by mouth daily.   MULTIPLE VITAMIN (MULTI-VITAMIN) TABLET    Take 1 tablet by mouth daily.   SODIUM CHLORIDE 1 G TABLET    TAKE 1 TABLET(1 GRAM) BY MOUTH THREE TIMES DAILY  Modified Medications   No medications on file  Discontinued Medications   No medications on file     Allergies  Allergen Reactions   Sitagliptin     Other reaction(s): DIZZINESS. (JANUVIA)   Statins Other (See Comments)    Hospitalized on 2 occasions after trying statins. Patient became dizzy and increased fall risk   Sulfa Antibiotics     (SULFA)     REVIEW OF SYSTEMS:  GENERAL: poor appetite, has fever, no chills MOUTH and THROAT: +sore  throat RESPIRATORY: + cough, no SOB CARDIAC: no chest pain, edema or palpitations GI: no abdominal pain, diarrhea, constipation, heart burn, nausea or vomiting GU: Denies dysuria, frequency, hematuria, incontinence, or discharge NEUROLOGICAL: Denies dizziness, syncope, numbness, or headache PSYCHIATRIC: Denies feeling of depression or anxiety. No report of hallucinations, insomnia, paranoia, or agitation   LABS/RADIOLOGY: Labs reviewed: Basic Metabolic Panel: Recent Labs    03/14/21 2349 03/15/21 0605 03/17/21 0704 03/18/21 0553 05/01/21 1526  NA  --    < > 130* 131* 134*  K  --    < > 3.9 3.6 3.6  CL  --    < > 97* 97* 97*  CO2  --    < > 24 25 24   GLUCOSE  --    < > 131* 137* 138  BUN  --    < > 7* 7* 14  CREATININE  --    < > 0.41* 0.45 0.72  CALCIUM  --    < > 9.2 9.2 9.5  MG 1.8   < > 1.6* 1.8 1.6  PHOS 4.4  --   --   --   --    < > = values in this interval not displayed.   Liver Function Tests: Recent Labs    03/14/21 1724 05/01/21 1526  AST 24 14  ALT 17 11  ALKPHOS 76  --   BILITOT 0.9 0.3  PROT 7.6 6.7  ALBUMIN 4.1  --    No results for input(s): LIPASE, AMYLASE in the last 8760 hours. No results for input(s): AMMONIA in the last 8760 hours. CBC: Recent Labs    03/14/21 1724 03/14/21 1734 03/15/21 0605 05/01/21 1526  WBC 10.3  --  10.4 9.2  NEUTROABS 6.2  --   --  5,437  HGB 15.3* 16.7* 14.6 13.4  HCT 44.5 49.0* 41.2 40.1  MCV 79.9*  --  78.9* 82.9  PLT 448*  --  357 462*   A1C: Invalid input(s): A1C Lipid Panel: Recent Labs    05/01/21 1526  HDL 64   Cardiac Enzymes: No results for input(s): CKTOTAL, CKMB, CKMBINDEX, TROPONINI in the last 8760 hours. BNP: Invalid input(s): POCBNP CBG: Recent Labs    03/17/21 2132 03/18/21 0612 03/18/21 1101  GLUCAP 127* 131* 139*      No results found.  ASSESSMENT/PLAN:  1. Acute bronchitis, unspecified organism -  patient will call back regarding the phone number to [facility]  so [facility] staff can notify them about the medications - doxycycline (VIBRA-TABS) 100 MG tablet; Take 1 tablet (100 mg total) by mouth 2 (two) times daily for 10 days.  Dispense: 20 tablet; Refill: 0 - saccharomyces boulardii (  FLORASTOR) 250 MG capsule; Take 1 capsule (250 mg total) by mouth 2 (two) times daily for 13 days.  Dispense: 26 capsule; Refill: 0 - guaiFENesin (MUCINEX) 600 MG 12 hr tablet; Take 1 tablet (600 mg total) by mouth 2 (two) times daily for 14 days.  Dispense: 28 tablet; Refill: 0    Time spent on non face to face visit:  15 minutes  The patient gave consent to this telephone visit. Explained to the patient the risk and privacy issue that was involved with this telephone call.   The patient was advised to call back and ask for an in-person evaluation if the symptoms worsen or if the condition fails to improve.   Durenda Age, NP [employer] [phone]

## 2021-07-31 NOTE — Patient Instructions (Signed)
Acute Bronchitis, Adult °Acute bronchitis is sudden inflammation of the main airways (bronchi) that come off the windpipe (trachea) in the lungs. The swelling causes the airways to get smaller and make more mucus than normal. This can make it hard to breathe and can cause coughing or noisy breathing (wheezing). °Acute bronchitis may last several weeks. The cough may last longer. Allergies, asthma, and exposure to smoke may make the condition worse. °What are the causes? °This condition can be caused by germs and by substances that irritate the lungs, including: °Cold and flu viruses. The most common cause of this condition is the virus that causes the common cold. °Bacteria. This is less common. °Breathing in substances that irritate the lungs, including: °Smoke from cigarettes and other forms of tobacco. °Dust and pollen. °Fumes from household cleaning products, gases, or burned fuel. °Indoor or outdoor air pollution. °What increases the risk? °The following factors may make you more likely to develop this condition: °A weak body's defense system, also called the immune system. °A condition that affects your lungs and breathing, such as asthma. °What are the signs or symptoms? °Common symptoms of this condition include: °Coughing. This may bring up clear, yellow, or green mucus from your lungs (sputum). °Wheezing. °Runny or stuffy nose. °Having too much mucus in your lungs (chest congestion). °Shortness of breath. °Aches and pains, including sore throat or chest. °How is this diagnosed? °This condition is usually diagnosed based on: °Your symptoms and medical history. °A physical exam. °You may also have other tests, including tests to rule out other conditions, such as pneumonia. These tests include: °A test of lung function. °Test of a mucus sample to look for the presence of bacteria. °Tests to check the oxygen level in your blood. °Blood tests. °Chest X-ray. °How is this treated? °Most cases of acute bronchitis  clear up over time without treatment. Your health care provider may recommend: °Drinking more fluids to help thin your mucus so it is easier to cough up. °Taking inhaled medicine (inhaler) to improve air flow in and out of your lungs. °Using a vaporizer or a humidifier. These are machines that add water to the air to help you breathe better. °Taking a medicine that thins mucus and clears congestion (expectorant). °Taking a medicine that prevents or stops coughing (cough suppressant). °It is notcommon to take an antibiotic medicine for this condition. °Follow these instructions at home: ° °Take over-the-counter and prescription medicines only as told by your health care provider. °Use an inhaler, vaporizer, or humidifier as told by your health care provider. °Take two teaspoons (10 mL) of honey at bedtime to lessen coughing at night. °Drink enough fluid to keep your urine pale yellow. °Do not use any products that contain nicotine or tobacco. These products include cigarettes, chewing tobacco, and vaping devices, such as e-cigarettes. If you need help quitting, ask your health care provider. °Get plenty of rest. °Return to your normal activities as told by your health care provider. Ask your health care provider what activities are safe for you. °Keep all follow-up visits. This is important. °How is this prevented? °To lower your risk of getting this condition again: °Wash your hands often with soap and water for at least 20 seconds. If soap and water are not available, use hand sanitizer. °Avoid contact with people who have cold symptoms. °Try not to touch your mouth, nose, or eyes with your hands. °Avoid breathing in smoke or chemical fumes. Breathing smoke or chemical fumes will make your condition   worse. °Get the flu shot every year. °Contact a health care provider if: °Your symptoms do not improve after 2 weeks. °You have trouble coughing up the mucus. °Your cough keeps you awake at night. °You have a  fever. °Get help right away if you: °Cough up blood. °Feel pain in your chest. °Have severe shortness of breath. °Faint or keep feeling like you are going to faint. °Have a severe headache. °Have a fever or chills that get worse. °These symptoms may represent a serious problem that is an emergency. Do not wait to see if the symptoms will go away. Get medical help right away. Call your local emergency services (911 in the [place] Do not drive yourself to the hospital. °Summary °Acute bronchitis is inflammation of the main airways (bronchi) that come off the windpipe (trachea) in the lungs. The swelling causes the airways to get smaller and make more mucus than normal. °Drinking more fluids can help thin your mucus so it is easier to cough up. °Take over-the-counter and prescription medicines only as told by your health care provider. °Do not use any products that contain nicotine or tobacco. These products include cigarettes, chewing tobacco, and vaping devices, such as e-cigarettes. If you need help quitting, ask your health care provider. °Contact a health care provider if your symptoms do not improve after 2 weeks. °This information is not intended to replace advice given to you by your health care provider. Make sure you discuss any questions you have with your health care provider. °Document Revised: 12/10/2020 Document Reviewed: 12/10/2020 °Elsevier Patient Education © 2022 [employer] ° °

## 2021-08-24 ENCOUNTER — Other Ambulatory Visit: Payer: Self-pay | Admitting: Family

## 2021-08-24 DIAGNOSIS — E871 Hypo-osmolality and hyponatremia: Secondary | ICD-10-CM

## 2021-08-28 ENCOUNTER — Other Ambulatory Visit: Payer: Self-pay | Admitting: Family

## 2021-08-28 DIAGNOSIS — I10 Essential (primary) hypertension: Secondary | ICD-10-CM

## 2021-10-30 ENCOUNTER — Ambulatory Visit: Payer: Medicare Other | Admitting: Family

## 2021-11-27 ENCOUNTER — Other Ambulatory Visit: Payer: Self-pay | Admitting: Family

## 2021-11-27 DIAGNOSIS — E871 Hypo-osmolality and hyponatremia: Secondary | ICD-10-CM

## 2021-12-02 ENCOUNTER — Encounter (HOSPITAL_BASED_OUTPATIENT_CLINIC_OR_DEPARTMENT_OTHER): Payer: Self-pay | Admitting: Urology

## 2021-12-02 ENCOUNTER — Emergency Department (HOSPITAL_BASED_OUTPATIENT_CLINIC_OR_DEPARTMENT_OTHER): Payer: Medicare Other | Admitting: Radiology

## 2021-12-02 ENCOUNTER — Telehealth: Payer: Self-pay

## 2021-12-02 ENCOUNTER — Emergency Department (HOSPITAL_BASED_OUTPATIENT_CLINIC_OR_DEPARTMENT_OTHER): Payer: Medicare Other

## 2021-12-02 ENCOUNTER — Other Ambulatory Visit: Payer: Self-pay

## 2021-12-02 ENCOUNTER — Inpatient Hospital Stay (HOSPITAL_BASED_OUTPATIENT_CLINIC_OR_DEPARTMENT_OTHER)
Admission: EM | Admit: 2021-12-02 | Discharge: 2021-12-06 | DRG: 066 | Disposition: A | Discharge status: Rehabilitation Facility | Payer: Medicare Other | Attending: Internal Medicine | Admitting: Internal Medicine

## 2021-12-02 ENCOUNTER — Ambulatory Visit (INDEPENDENT_AMBULATORY_CARE_PROVIDER_SITE_OTHER): Payer: Medicare Other | Admitting: Family

## 2021-12-02 ENCOUNTER — Encounter: Payer: Self-pay | Admitting: Family

## 2021-12-02 VITALS — BP 142/88 | HR 100 | Temp 97.7°F | Resp 18 | Ht 65.0 in | Wt 227.6 lb

## 2021-12-02 DIAGNOSIS — G479 Sleep disorder, unspecified: Secondary | ICD-10-CM | POA: Diagnosis present

## 2021-12-02 DIAGNOSIS — E669 Obesity, unspecified: Secondary | ICD-10-CM

## 2021-12-02 DIAGNOSIS — E119 Type 2 diabetes mellitus without complications: Secondary | ICD-10-CM | POA: Diagnosis not present

## 2021-12-02 DIAGNOSIS — Z888 Allergy status to other drugs, medicaments and biological substances status: Secondary | ICD-10-CM

## 2021-12-02 DIAGNOSIS — I10 Essential (primary) hypertension: Secondary | ICD-10-CM | POA: Diagnosis present

## 2021-12-02 DIAGNOSIS — I639 Cerebral infarction, unspecified: Secondary | ICD-10-CM | POA: Diagnosis present

## 2021-12-02 DIAGNOSIS — I63412 Cerebral infarction due to embolism of left middle cerebral artery: Secondary | ICD-10-CM

## 2021-12-02 DIAGNOSIS — R451 Restlessness and agitation: Secondary | ICD-10-CM | POA: Diagnosis present

## 2021-12-02 DIAGNOSIS — R21 Rash and other nonspecific skin eruption: Secondary | ICD-10-CM | POA: Diagnosis present

## 2021-12-02 DIAGNOSIS — R410 Disorientation, unspecified: Secondary | ICD-10-CM

## 2021-12-02 DIAGNOSIS — E785 Hyperlipidemia, unspecified: Secondary | ICD-10-CM

## 2021-12-02 DIAGNOSIS — E876 Hypokalemia: Secondary | ICD-10-CM | POA: Diagnosis not present

## 2021-12-02 DIAGNOSIS — R4701 Aphasia: Principal | ICD-10-CM

## 2021-12-02 DIAGNOSIS — F418 Other specified anxiety disorders: Secondary | ICD-10-CM

## 2021-12-02 DIAGNOSIS — F419 Anxiety disorder, unspecified: Secondary | ICD-10-CM | POA: Diagnosis present

## 2021-12-02 DIAGNOSIS — I63512 Cerebral infarction due to unspecified occlusion or stenosis of left middle cerebral artery: Secondary | ICD-10-CM | POA: Diagnosis not present

## 2021-12-02 DIAGNOSIS — F482 Pseudobulbar affect: Secondary | ICD-10-CM | POA: Diagnosis present

## 2021-12-02 DIAGNOSIS — Z20822 Contact with and (suspected) exposure to covid-19: Secondary | ICD-10-CM | POA: Diagnosis present

## 2021-12-02 DIAGNOSIS — F32A Depression, unspecified: Secondary | ICD-10-CM | POA: Diagnosis present

## 2021-12-02 DIAGNOSIS — Z79899 Other long term (current) drug therapy: Secondary | ICD-10-CM

## 2021-12-02 DIAGNOSIS — Z7984 Long term (current) use of oral hypoglycemic drugs: Secondary | ICD-10-CM

## 2021-12-02 DIAGNOSIS — R29706 NIHSS score 6: Secondary | ICD-10-CM | POA: Diagnosis present

## 2021-12-02 DIAGNOSIS — Z882 Allergy status to sulfonamides status: Secondary | ICD-10-CM

## 2021-12-02 DIAGNOSIS — E8809 Other disorders of plasma-protein metabolism, not elsewhere classified: Secondary | ICD-10-CM | POA: Diagnosis not present

## 2021-12-02 DIAGNOSIS — R2990 Unspecified symptoms and signs involving the nervous system: Secondary | ICD-10-CM | POA: Diagnosis present

## 2021-12-02 DIAGNOSIS — Z7982 Long term (current) use of aspirin: Secondary | ICD-10-CM

## 2021-12-02 DIAGNOSIS — Z6837 Body mass index (BMI) 37.0-37.9, adult: Secondary | ICD-10-CM

## 2021-12-02 DIAGNOSIS — Z8673 Personal history of transient ischemic attack (TIA), and cerebral infarction without residual deficits: Secondary | ICD-10-CM

## 2021-12-02 DIAGNOSIS — F1291 Cannabis use, unspecified, in remission: Secondary | ICD-10-CM

## 2021-12-02 DIAGNOSIS — I6930 Unspecified sequelae of cerebral infarction: Secondary | ICD-10-CM

## 2021-12-02 LAB — COMPREHENSIVE METABOLIC PANEL
ALT: 12 U/L (ref 0–44)
AST: 14 U/L — ABNORMAL LOW (ref 15–41)
Albumin: 4.5 g/dL (ref 3.5–5.0)
Alkaline Phosphatase: 94 U/L (ref 38–126)
Anion gap: 11 (ref 5–15)
BUN: 15 mg/dL (ref 8–23)
CO2: 24 mmol/L (ref 22–32)
Calcium: 10.3 mg/dL (ref 8.9–10.3)
Chloride: 99 mmol/L (ref 98–111)
Creatinine, Ser: 0.57 mg/dL (ref 0.44–1.00)
GFR, Estimated: >60 mL/min (ref >60)
Glucose, Bld: 137 mg/dL — ABNORMAL HIGH (ref 70–99)
Potassium: 3.6 mmol/L (ref 3.5–5.1)
Sodium: 134 mmol/L — ABNORMAL LOW (ref 135–145)
Total Bilirubin: 0.4 mg/dL (ref 0.3–1.2)
Total Protein: 8.1 g/dL (ref 6.5–8.1)

## 2021-12-02 LAB — AMMONIA: Ammonia: 37 umol/L — ABNORMAL HIGH (ref 9–35)

## 2021-12-02 LAB — RAPID URINE DRUG SCREEN, HOSP PERFORMED
Amphetamines: NOT DETECTED
Barbiturates: NOT DETECTED
Benzodiazepines: NOT DETECTED
Cocaine: NOT DETECTED
Opiates: NOT DETECTED
Tetrahydrocannabinol: NOT DETECTED

## 2021-12-02 LAB — CBC
HCT: 42.5 % (ref 36.0–46.0)
Hemoglobin: 13.9 g/dL (ref 12.0–15.0)
MCH: 26.4 pg (ref 26.0–34.0)
MCHC: 32.7 g/dL (ref 30.0–36.0)
MCV: 80.8 fL (ref 80.0–100.0)
Platelets: 372 10*3/uL (ref 150–400)
RBC: 5.26 MIL/uL — ABNORMAL HIGH (ref 3.87–5.11)
RDW: 13.5 % (ref 11.5–15.5)
WBC: 9 10*3/uL (ref 4.0–10.5)
nRBC: 0 % (ref 0.0–0.2)

## 2021-12-02 LAB — DIFFERENTIAL
Abs Immature Granulocytes: 0.02 10*3/uL (ref 0.00–0.07)
Basophils Absolute: 0 10*3/uL (ref 0.0–0.1)
Basophils Relative: 0 %
Eosinophils Absolute: 0.2 10*3/uL (ref 0.0–0.5)
Eosinophils Relative: 2 %
Immature Granulocytes: 0 %
Lymphocytes Relative: 30 %
Lymphs Abs: 2.7 10*3/uL (ref 0.7–4.0)
Monocytes Absolute: 0.9 10*3/uL (ref 0.1–1.0)
Monocytes Relative: 10 %
Neutro Abs: 5.2 10*3/uL (ref 1.7–7.7)
Neutrophils Relative %: 58 %

## 2021-12-02 LAB — CBG MONITORING, ED: Glucose-Capillary: 132 mg/dL — ABNORMAL HIGH (ref 70–99)

## 2021-12-02 LAB — I-STAT VENOUS BLOOD GAS, ED
Acid-Base Excess: 0 mmol/L (ref 0.0–2.0)
Bicarbonate: 24.2 mmol/L (ref 20.0–28.0)
Calcium, Ion: 1.16 mmol/L (ref 1.15–1.40)
HCT: 37 % (ref 36.0–46.0)
Hemoglobin: 12.6 g/dL (ref 12.0–15.0)
O2 Saturation: 53 %
Potassium: 3.5 mmol/L (ref 3.5–5.1)
Sodium: 138 mmol/L (ref 135–145)
TCO2: 25 mmol/L (ref 22–32)
pCO2, Ven: 38.3 mmHg — ABNORMAL LOW (ref 44–60)
pH, Ven: 7.408 (ref 7.25–7.43)
pO2, Ven: 28 mmHg — CL (ref 32–45)

## 2021-12-02 LAB — APTT: aPTT: 31 seconds (ref 24–36)

## 2021-12-02 LAB — URINALYSIS, ROUTINE W REFLEX MICROSCOPIC
Bilirubin Urine: NEGATIVE
Glucose, UA: NEGATIVE mg/dL
Hgb urine dipstick: NEGATIVE
Ketones, ur: NEGATIVE mg/dL
Leukocytes,Ua: NEGATIVE
Nitrite: NEGATIVE
Protein, ur: NEGATIVE mg/dL
Specific Gravity, Urine: 1.007 (ref 1.005–1.030)
pH: 6.5 (ref 5.0–8.0)

## 2021-12-02 LAB — PROTIME-INR
INR: 1 (ref 0.8–1.2)
Prothrombin Time: 13.1 seconds (ref 11.4–15.2)

## 2021-12-02 LAB — ETHANOL: Alcohol, Ethyl (B): <10 mg/dL (ref <10)

## 2021-12-02 IMAGING — CT CT HEAD W/O CM
4 series · 17 of 47 positions shown, 19 images · non-contrast
Comparison: [DATE]

CLINICAL DATA: Altered mental status.



[Series 2: head bone · axial · 0.42mm/px · z∈[+920,+976]mm · 4 of 82 slices shown]
[im 9/82  bone]
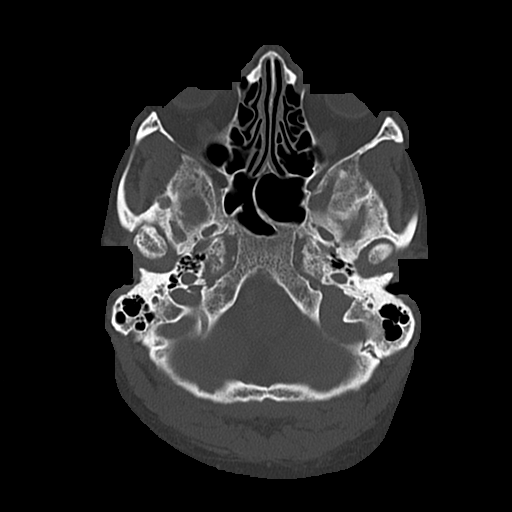
[im 17/82  bone]
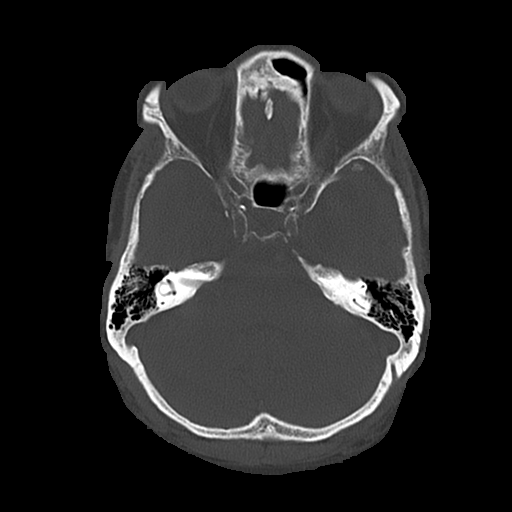
[im 25/82  bone]
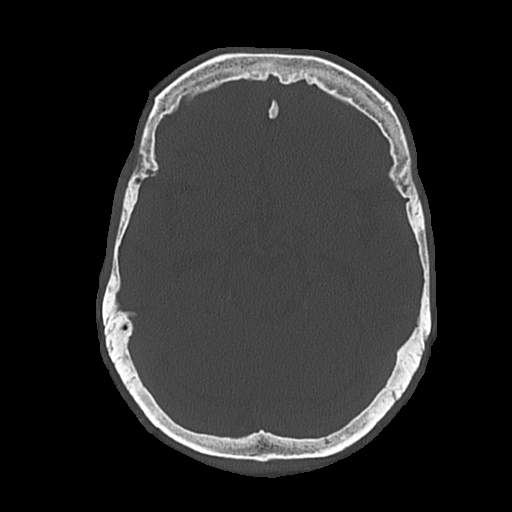
[im 37/82  bone]
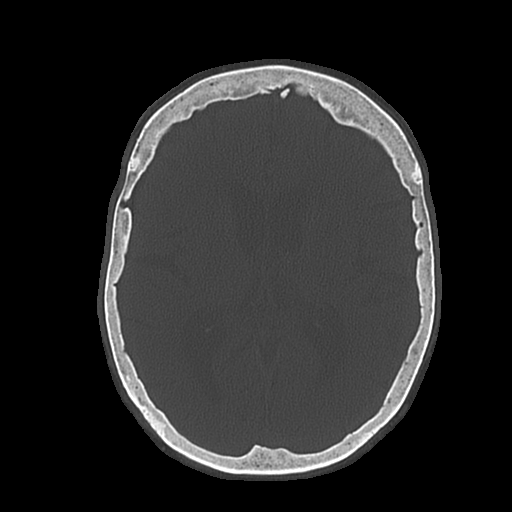

[Series 3: head wo · axial · 0.42mm/px · z∈[+924,+1044]mm · 7 of 33 slices shown, 9 images]
[im 5/33  brain]
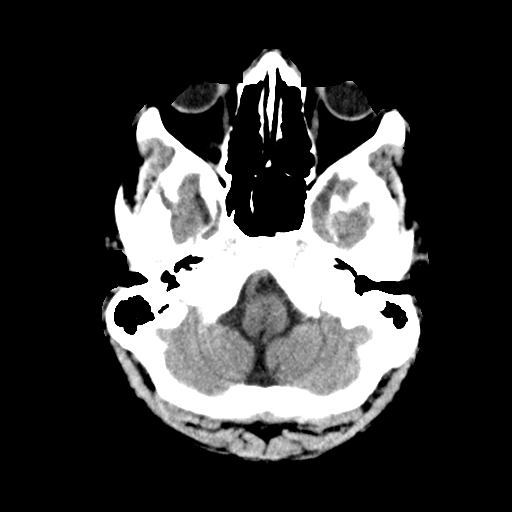
[im 5/33  bone]
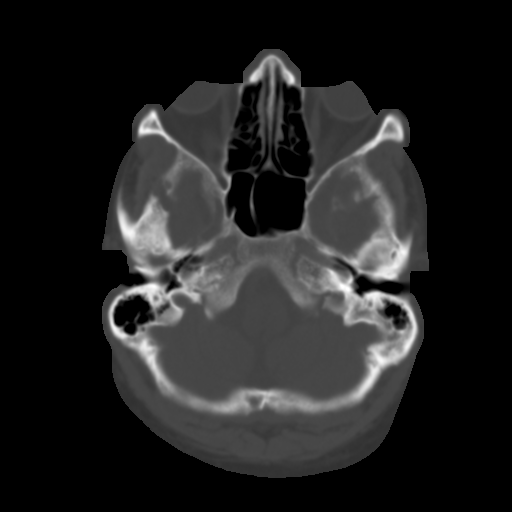
[im 9/33  brain]
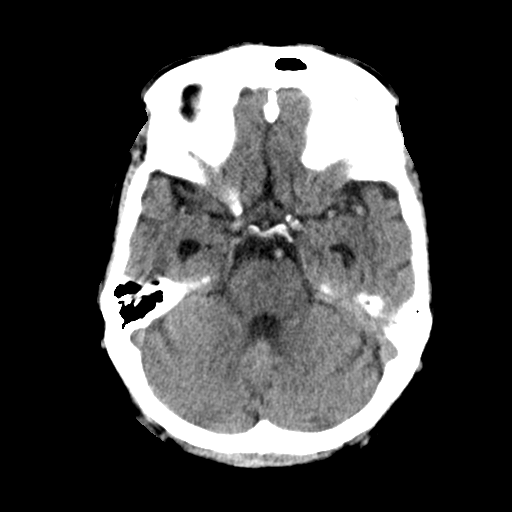
[im 13/33  brain]
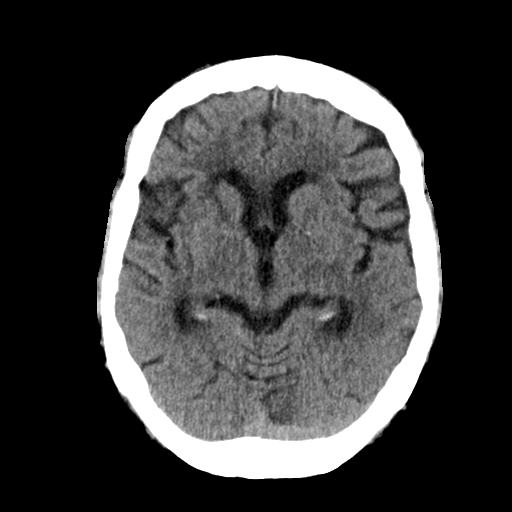
[im 17/33  brain]
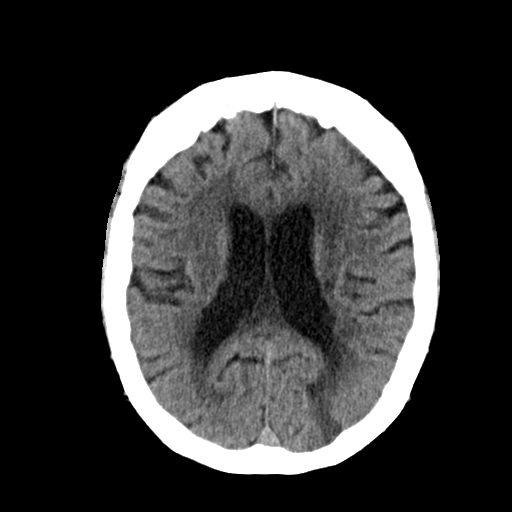
[im 21/33  brain]
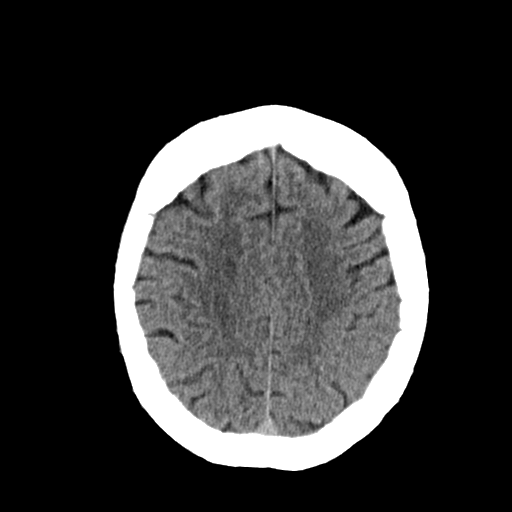
[im 21/33  bone]
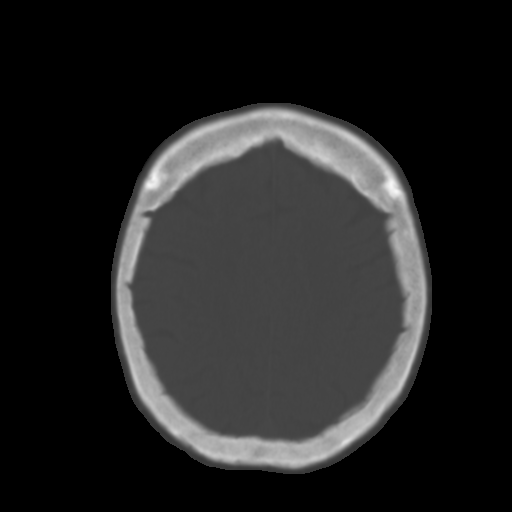
[im 25/33  brain]
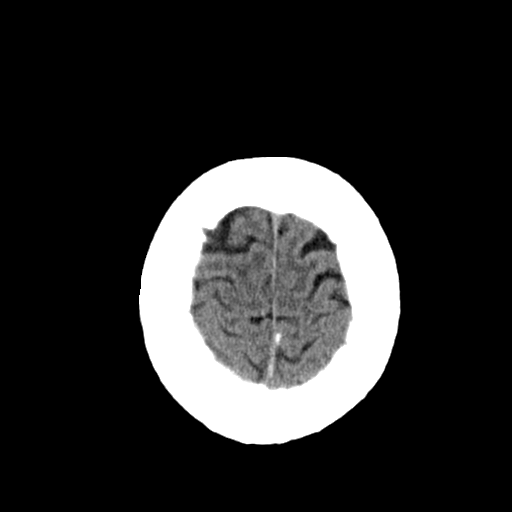
[im 29/33  brain]
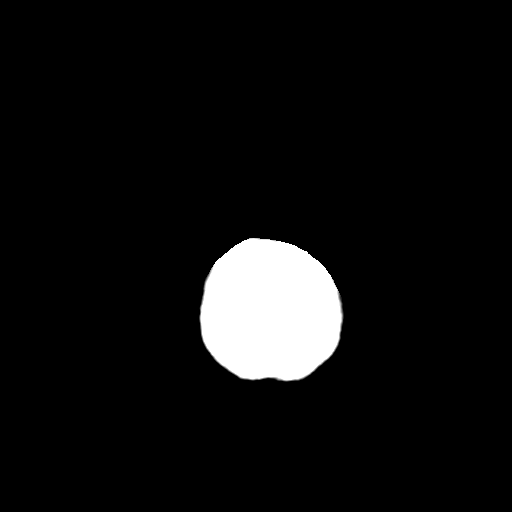

[Series 4: coronal soft · coronal · 0.31mm/px · 3 of 64 slices shown]
[im 22/64  brain]
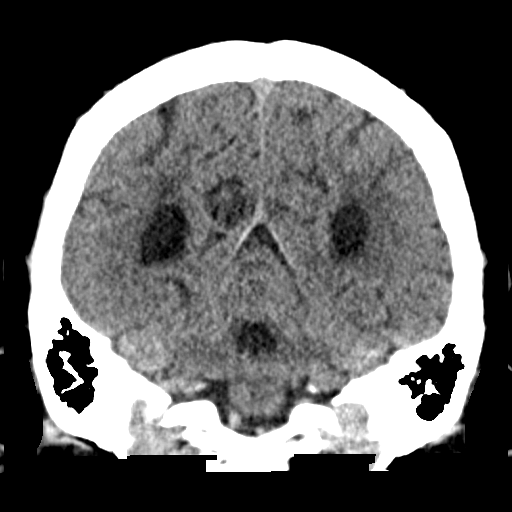
[im 29/64  brain]
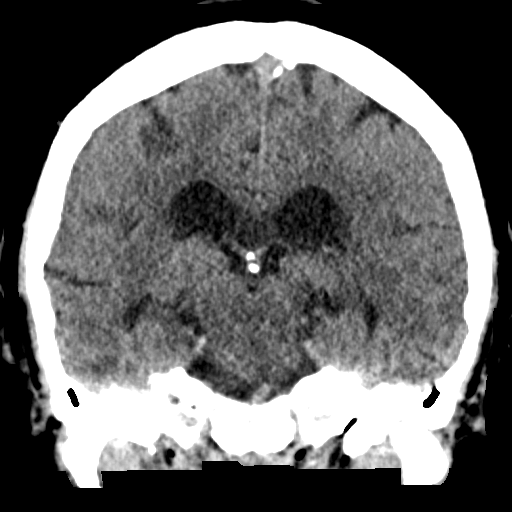
[im 36/64  brain]
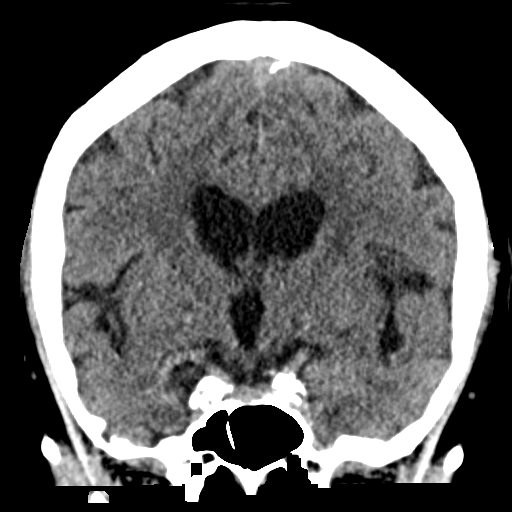

[Series 5: sagittal soft · sagittal · 0.31mm/px · 3 of 53 slices shown]
[im 18/53  brain]
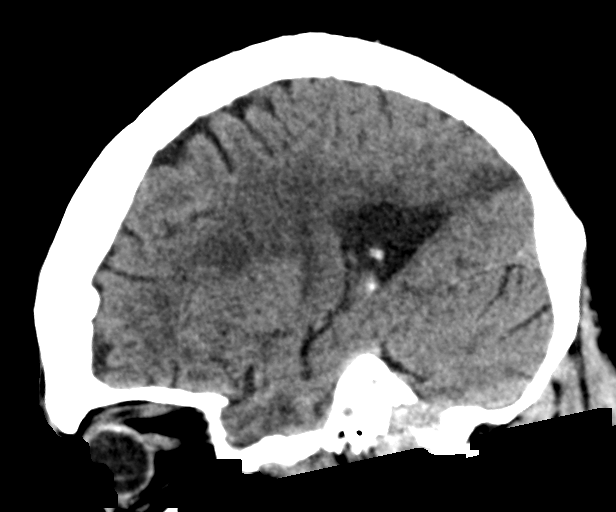
[im 27/53  brain]
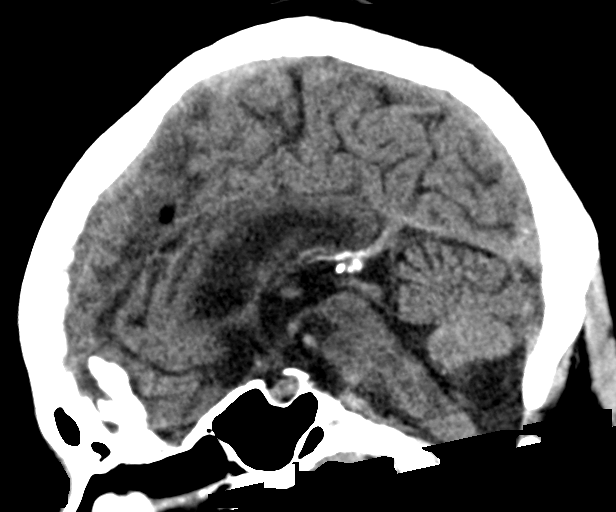
[im 35/53  brain]
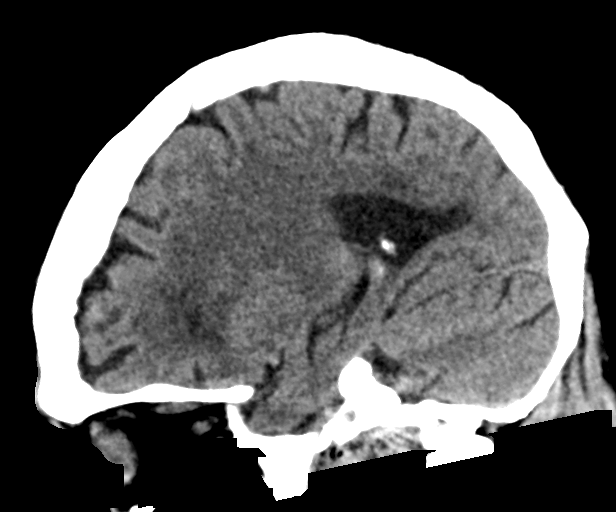

[17 of 47 positions shown; findings below may reference images not displayed]

FINDINGS: Brain: There is mild to moderate severity cerebral atrophy with
widening of the extra-axial spaces and ventricular dilatation.
There are areas of decreased attenuation within the white matter
tracts of the supratentorial brain, consistent with microvascular
disease changes.

Vascular: No hyperdense vessel or unexpected calcification.

Skull: Normal. Negative for fracture or focal lesion.

Sinuses/Orbits: No acute finding.

Other: None.
IMPRESSION: 1. No acute intracranial abnormality.
2. Generalized cerebral atrophy and microvascular disease changes of
the supratentorial brain.

## 2021-12-02 MED ORDER — LORAZEPAM 2 MG/ML IJ SOLN
0.5000 mg | Freq: Once | INTRAMUSCULAR | Status: AC
  Administered 2021-12-03: 0.5 mg via INTRAVENOUS
  Filled 2021-12-02: qty 1

## 2021-12-02 MED ORDER — THIAMINE HCL 100 MG/ML IJ SOLN
100.0000 mg | Freq: Once | INTRAMUSCULAR | Status: AC
  Administered 2021-12-03: 100 mg via INTRAVENOUS
  Filled 2021-12-02: qty 2

## 2021-12-02 NOTE — ED Notes (Signed)
MD at the Bedside. 

## 2021-12-02 NOTE — ED Notes (Signed)
XRAY at the Bedside. ?

## 2021-12-02 NOTE — Telephone Encounter (Signed)
Will call patients daughter

## 2021-12-02 NOTE — Progress Notes (Signed)
? ?Provider: Richarda Blade FNP-C ? ?Deepika Decatur, Donalee Citrin, NP ? ?Patient Care Team: ?Amada Hallisey, Donalee Citrin, NP as PCP - General (Family Medicine) ? ?Extended Emergency Contact Information ?Primary Emergency Contact: Lawrie-GROSS,[name] ?Home Phone: [phone] ?Relation: Daughter ?Secondary Emergency Contact: Thane,Lisa ?Mobile Phone: [phone] ?Relation: Daughter ? ?Code Status: Full Code  ?Goals of care: Advanced Directive information ? ?  12/02/2021  ?  9:45 AM  ?Advanced Directives  ?Does Patient Have a Medical Advance Directive? No  ?Would patient like information on creating a medical advance directive? No - Patient declined  ? ? ? ?Chief Complaint  ?Patient presents with  ? Acute Visit  ?  Patient is disoriented and having trouble with speech.  ? ? ?HPI:  ?Pt is a 76 y.o. female seen today for an acute visit for evaluation of disorientation and trouble with speech x 1 month.she thinks it's due to her anxiety.she keeps repeating self that she does not know why her voice keeps shaking yet she used to articulate properly." My mind cannot do or say what I need to" feels like she is loosing control of her speech.  Caregiver present during the visit states does not notice any difference in patient's speech ?She denies any fever,chills,cough  ? ? ?Past Medical History:  ?Diagnosis Date  ? Anxiety   ? Asthma   ? Diabetes mellitus without complication ([facility])   ? Hypertension   ? Stroke [facility])   ? ?Past Surgical History:  ?Procedure Laterality Date  ? arm fracture    ? arm surgery  ? CHOLECYSTECTOMY    ? ? ?Allergies  ?Allergen Reactions  ? Sitagliptin   ?  Other reaction(s): DIZZINESS. ?(JANUVIA)  ? Statins Other (See Comments)  ?  Hospitalized on 2 occasions after trying statins. Patient became dizzy and increased fall risk  ? Sulfa Antibiotics   ?  (SULFA)  ? ? ?Outpatient Encounter Medications as of 12/02/2021  ?Medication Sig  ? acetaminophen (TYLENOL) 325 MG tablet Take 650 mg by mouth every 6 (six) hours as needed for  mild pain or headache.  ? amLODipine (NORVASC) 5 MG tablet TAKE 1 TABLET(5 MG) BY MOUTH DAILY  ? aspirin EC 81 MG tablet Take 1 tablet (81 mg total) by mouth daily. Swallow whole.  ? FLUoxetine (PROZAC) 20 MG capsule Take one capsule by mouth once daily.  ? losartan (COZAAR) 100 MG tablet TAKE 1 TABLET(100 MG) BY MOUTH DAILY  ? Melatonin 10 MG CAPS Take 1 capsule by mouth at bedtime.  ? metFORMIN (GLUCOPHAGE XR) 500 MG 24 hr tablet Take two tablet by mouth daily.  ? Multiple Vitamin (MULTI-VITAMIN) tablet Take 1 tablet by mouth daily.  ? sodium chloride 1 g tablet TAKE 1 TABLET(1 GRAM) BY MOUTH THREE TIMES DAILY  ? clonazePAM (KLONOPIN) 0.5 MG tablet Take 0.5 mg by mouth 2 (two) times daily as needed.  ? [DISCONTINUED] bisacodyl (DULCOLAX) 10 MG suppository as needed. If not relieved by MOM, give 10 mg Bisacodyl suppositiory rectally X 1 dose in 24 hours as needed (Do not use constipation standing orders for residents with renal failure/CFR less than 30. Contact MD for orders)  ? ?No facility-administered encounter medications on file as of 12/02/2021.  ? ? ?Review of Systems  ?Constitutional:  Negative for appetite change, chills, fatigue, fever and unexpected weight change.  ?HENT:  Negative for congestion, dental problem, ear discharge, ear pain, facial swelling, hearing loss, nosebleeds, postnasal drip, rhinorrhea, sinus pressure, sinus pain, sneezing, sore throat, tinnitus and trouble swallowing.   ?  Eyes:  Negative for pain, discharge, redness, itching and visual disturbance.  ?Respiratory:  Negative for cough, chest tightness, shortness of breath and wheezing.   ?Cardiovascular:  Negative for chest pain, palpitations and leg swelling.  ?Gastrointestinal:  Negative for abdominal distention, abdominal pain, blood in stool, constipation, diarrhea, nausea and vomiting.  ?Endocrine: Negative for cold intolerance, heat intolerance, polydipsia, polyphagia and polyuria.  ?Genitourinary:  Negative for difficulty  urinating, dysuria, flank pain, frequency and urgency.  ?Musculoskeletal:  Positive for gait problem. Negative for arthralgias, back pain, joint swelling, myalgias, neck pain and neck stiffness.  ?Skin:  Negative for color change, pallor, rash and wound.  ?Neurological:  Negative for dizziness, syncope, weakness, light-headedness, numbness and headaches.  ?     Reports unable to articulate her speech  ?Hematological:  Does not bruise/bleed easily.  ?Psychiatric/Behavioral:  Negative for agitation, behavioral problems, confusion, hallucinations, self-injury, sleep disturbance and suicidal ideas. The patient is nervous/anxious.   ? ?Immunization History  ?Administered Date(s) Administered  ? Fluad Quad(high Dose 65+) 06/06/2019  ? Influenza-Unspecified 04/16/2017, 05/08/2020  ? Janssen (J&J) SARS-COV-2 Vaccination 11/04/2019  ? PFIZER(Purple Top)SARS-COV-2 Vaccination 07/28/2021  ? Pneumococcal Conjugate-13 09/17/2016  ? Pneumococcal Polysaccharide-23 02/11/2009, 07/06/2018  ? Tdap 09/17/2016  ? ?Pertinent  Health Maintenance Due  ?Topic Date Due  ? FOOT EXAM  09/17/2020  ? OPHTHALMOLOGY EXAM  04/16/2021  ? HEMOGLOBIN A1C  09/21/2021  ? INFLUENZA VACCINE  03/23/2022  ? DEXA SCAN  Completed  ? ? ?  03/16/2021  ?  9:00 PM 03/17/2021  ?  8:10 AM 03/17/2021  ?  8:00 PM 03/18/2021  ? 10:00 AM 12/02/2021  ?  9:45 AM  ?Fall Risk  ?Falls in the past year?     0  ?Was there an injury with Fall?     0  ?Fall Risk Category Calculator     0  ?Fall Risk Category     Low  ?Patient Fall Risk Level High fall risk High fall risk High fall risk High fall risk Low fall risk  ?Patient at Risk for Falls Due to     No Fall Risks  ?Fall risk Follow up     Falls evaluation completed  ? ?Functional Status Survey: ?  ? ?Vitals:  ? 12/02/21 0942  ?BP: (!) 142/88  ?Pulse: 100  ?Resp: 18  ?Temp: 97.7 ?F (36.5 ?C)  ?SpO2: 95%  ?Weight: 227 lb 9.6 oz (103.2 kg)  ?Height: 5\' 5"  (1.651 m)  ? ?Body mass index is 37.87 kg/m?Marland Kitchen. ?Physical Exam ?Vitals  reviewed.  ?Constitutional:   ?   General: She is not in acute distress. ?   Appearance: Normal appearance. She is obese. She is not ill-appearing or diaphoretic.  ?HENT:  ?   Head: Normocephalic.  ?   Right Ear: Tympanic membrane, ear canal and external ear normal. There is no impacted cerumen.  ?   Left Ear: Tympanic membrane, ear canal and external ear normal. There is no impacted cerumen.  ?   Nose: Nose normal. No congestion or rhinorrhea.  ?   Mouth/Throat:  ?   Mouth: Mucous membranes are moist.  ?   Pharynx: Oropharynx is clear. No oropharyngeal exudate or posterior oropharyngeal erythema.  ?Eyes:  ?   General: No scleral icterus.    ?   Right eye: No discharge.     ?   Left eye: No discharge.  ?   Extraocular Movements: Extraocular movements intact.  ?   Conjunctiva/sclera: Conjunctivae normal.  ?  Pupils: Pupils are equal, round, and reactive to light.  ?Neck:  ?   Vascular: No carotid bruit.  ?Cardiovascular:  ?   Rate and Rhythm: Normal rate and regular rhythm.  ?   Pulses: Normal pulses.  ?   Heart sounds: Normal heart sounds. No murmur heard. ?  No friction rub. No gallop.  ?Pulmonary:  ?   Effort: Pulmonary effort is normal. No respiratory distress.  ?   Breath sounds: Normal breath sounds. No wheezing, rhonchi or rales.  ?Chest:  ?   Chest wall: No tenderness.  ?Abdominal:  ?   General: Bowel sounds are normal. There is no distension.  ?   Palpations: Abdomen is soft. There is no mass.  ?   Tenderness: There is no abdominal tenderness. There is no right CVA tenderness, left CVA tenderness, guarding or rebound.  ?Musculoskeletal:     ?   General: No swelling or tenderness. Normal range of motion.  ?   Cervical back: Normal range of motion. No rigidity or tenderness.  ?   Right lower leg: No edema.  ?   Left lower leg: No edema.  ?Lymphadenopathy:  ?   Cervical: No cervical adenopathy.  ?Skin: ?   General: Skin is warm and dry.  ?   Coloration: Skin is not pale.  ?   Findings: No bruising, erythema,  lesion or rash.  ?Neurological:  ?   Mental Status: She is alert and oriented to person, place, and time.  ?   Cranial Nerves: No cranial nerve deficit.  ?   Sensory: No sensory deficit.  ?   Motor: No weakness.  ?   Co

## 2021-12-02 NOTE — ED Notes (Signed)
Per Karene Fry, MD, Cooxemtry Panel does not need to be collected at this Time due to Delay in Resulting of Same. ?

## 2021-12-02 NOTE — ED Provider Notes (Addendum)
?[facility] EMERGENCY DEPT ?Provider Note ? ? ?CSN: [id number] ?Arrival date & time: 12/02/21  1836 ? ?  ? ?History ? ?Chief Complaint  ?Patient presents with  ? Altered Mental Status  ? ? ?Rebecca Barnes is a 76 y.o. female. ? ? ?Altered Mental Status ? ? 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 She was last normal two days ago. Has had progressive confusion and aphasia during that time. Symptoms appear to be worsening. Has had frequent urination over the past few days. Keeps having confusion and difficulty with words.  No neck pain or stiffness.  No fever or chills.  No vision abnormalities.  No facial droop.  No numbness or weakness. ? ?Home Medications ?Prior to Admission medications   ?Medication Sig Start Date End Date Taking? Authorizing Provider  ?acetaminophen (TYLENOL) 325 MG tablet Take 650 mg by mouth every 6 (six) hours as needed for mild pain or headache. 06/13/20   [provider]  ?amLODipine (NORVASC) 5 MG tablet TAKE 1 TABLET(5 MG) BY MOUTH DAILY 08/28/21   Ngetich, Dinah C, NP  ?aspirin EC 81 MG tablet Take 1 tablet (81 mg total) by mouth daily. Swallow whole. 04/09/21   [name], Monina C, NP  ?clonazePAM (KLONOPIN) 0.5 MG tablet Take 0.5 mg by mouth 2 (two) times daily as needed. 08/25/21   [provider]  ?FLUoxetine (PROZAC) 20 MG capsule Take one capsule by mouth once daily. 07/13/21   Ngetich, Dinah C, NP  ?losartan (COZAAR) 100 MG tablet TAKE 1 TABLET(100 MG) BY MOUTH DAILY 08/28/21   Ngetich, Donalee Citrin, NP  ?Melatonin 10 MG CAPS Take 1 capsule by mouth at bedtime.    [provider]  ?metFORMIN (GLUCOPHAGE XR) 500 MG 24 hr tablet Take two tablet by mouth daily. 07/13/21   Ngetich, Donalee Citrin, NP  ?Multiple Vitamin (MULTI-VITAMIN) tablet Take 1 tablet by mouth daily. 04/09/21   [name], Monina C, NP  ?sodium chloride 1 g tablet  TAKE 1 TABLET(1 GRAM) BY MOUTH THREE TIMES DAILY 11/30/21   Ngetich, Donalee Citrin, NP  ?   ? ?Allergies    ?Sitagliptin, Statins, and Sulfa antibiotics   ? ?Review of Systems   ?Review of Systems  ?Neurological:  Positive for speech difficulty.  ?All other systems reviewed and are negative. ? ?Physical Exam ?Updated Vital Signs ?BP (!) 153/73   Pulse 99   Temp 97.7 ?F (36.5 ?C) (Oral)   Resp 19   Ht 5\' 5"  (1.651 m)   Wt 103.2 kg   SpO2 91%   BMI 37.86 kg/m?  ?Physical Exam ?Vitals and nursing note reviewed.  ?Constitutional:   ?   General: She is not in acute distress. ?   Appearance: She is well-developed.  ?HENT:  ?   Head: Normocephalic and atraumatic.  ?Eyes:  ?   Conjunctiva/sclera: Conjunctivae normal.  ?Cardiovascular:  ?   Rate and Rhythm: Normal rate and regular rhythm.  ?Pulmonary:  ?   Effort: Pulmonary effort is normal. No respiratory distress.  ?   Breath sounds: Normal breath sounds.  ?Abdominal:  ?   Palpations: Abdomen is soft.  ?   Tenderness: There is no abdominal tenderness.  ?Musculoskeletal:     ?   General: No swelling.  ?   Cervical back: Normal range of motion and neck supple. No rigidity or tenderness.  ?Skin: ?  General: Skin is warm and dry.  ?   Capillary Refill: Capillary refill takes less than 2 seconds.  ?Neurological:  ?   Mental Status: She is alert.  ?   Comments: MENTAL STATUS EXAM:    ?Orientation: Alert and oriented to person, place and time.  ?Memory: Cooperative, follows commands well.  ?Language: Speech is confabulatory and aphasic ? ?CRANIAL NERVES:    ?CN 2 (Optic): Visual fields intact to confrontation.  ?CN 3,4,6 (EOM): Pupils equal and reactive to light. Full extraocular eye movement without nystagmus.  ?CN 5 (Trigeminal): Facial sensation is normal, no weakness of masticatory muscles.  ?CN 7 (Facial): No facial weakness or asymmetry.  ?CN 8 (Auditory): Auditory acuity grossly normal.  ?CN 9,10 (Glossophar): The uvula is midline, the palate elevates symmetrically.  ?CN  11 (spinal access): Normal sternocleidomastoid and trapezius strength.  ?CN 12 (Hypoglossal): The tongue is midline. No atrophy or fasciculations..  ? ?MOTOR:  Muscle Strength: 5/5RUE, 5/5LUE, 5/5RLE, 5/5LLE.  ? ?COORDINATION:   Intact finger-to-nose, no tremor.  ? ?SENSATION:   Intact to light touch all four extremities. ? ? ?  ?Psychiatric:     ?   Mood and Affect: Mood normal.  ? ? ?ED Results / Procedures / Treatments   ?Labs ?(all labs ordered are listed, but only abnormal results are displayed) ?Labs Reviewed  ?CBC - Abnormal; Notable for the following components:  ?    Result Value  ? RBC 5.26 (*)   ? All other components within normal limits  ?COMPREHENSIVE METABOLIC PANEL - Abnormal; Notable for the following components:  ? Sodium 134 (*)   ? Glucose, Bld 137 (*)   ? AST 14 (*)   ? All other components within normal limits  ?URINALYSIS, ROUTINE W REFLEX MICROSCOPIC - Abnormal; Notable for the following components:  ? Color, Urine COLORLESS (*)   ? All other components within normal limits  ?AMMONIA - Abnormal; Notable for the following components:  ? Ammonia 37 (*)   ? All other components within normal limits  ?CBG MONITORING, ED - Abnormal; Notable for the following components:  ? Glucose-Capillary 132 (*)   ? All other components within normal limits  ?I-STAT VENOUS BLOOD GAS, ED - Abnormal; Notable for the following components:  ? pCO2, Ven 38.3 (*)   ? pO2, Ven 28 (*)   ? All other components within normal limits  ?PROTIME-INR  ?APTT  ?DIFFERENTIAL  ?ETHANOL  ?RAPID URINE DRUG SCREEN, HOSP PERFORMED  ?COOXEMETRY PANEL  ? ? ?EKG ?EKG Interpretation ? ?Date/Time:  Wednesday December 02 2021 19:09:42 EDT ?Ventricular Rate:  96 ?PR Interval:  168 ?QRS Duration: 86 ?QT Interval:  346 ?QTC Calculation: 437 ?R Axis:   -18 ?Text Interpretation: Normal sinus rhythm Normal ECG When compared with ECG of 14-Mar-2021 18:56, PREVIOUS ECG IS PRESENT Confirmed by Ernie AvenaLawsing, Jerene Yeager ([id number]) on 12/02/2021 8:53:18  PM ? ?Radiology ?CT HEAD WO CONTRAST ? ?Result Date: 12/02/2021 ?CLINICAL DATA:  Altered mental status. EXAM: CT HEAD WITHOUT CONTRAST TECHNIQUE: Contiguous axial images were obtained from the base of the skull through the vertex without intravenous contrast. RADIATION DOSE REDUCTION: This exam was performed according to the departmental dose-optimization program which includes automated exposure control, adjustment of the mA and/or kV according to patient size and/or use of iterative reconstruction technique. COMPARISON:  March 14, 2021 FINDINGS: Brain: There is mild to moderate severity cerebral atrophy with widening of the extra-axial spaces and ventricular dilatation. There are areas of decreased attenuation within the white  matter tracts of the supratentorial brain, consistent with microvascular disease changes. Vascular: No hyperdense vessel or unexpected calcification. Skull: Normal. Negative for fracture or focal lesion. Sinuses/Orbits: No acute finding. Other: None. IMPRESSION: 1. No acute intracranial abnormality. 2. Generalized cerebral atrophy and microvascular disease changes of the supratentorial brain. Electronically Signed   By: Aram Candela M.D.   On: 12/02/2021 19:33  ? ?DG Chest Port 1 View ? ?Result Date: 12/02/2021 ?CLINICAL DATA:  Altered mental status. EXAM: PORTABLE CHEST 1 VIEW COMPARISON:  June 01, 2019 FINDINGS: Chronic appearing increased lung markings are seen with mild atelectasis noted within the bilateral lung bases. There is no evidence of a pleural effusion or pneumothorax. The cardiac silhouette is mildly enlarged and unchanged in size. Multilevel degenerative changes seen throughout the thoracic spine. IMPRESSION: Chronic appearing increased lung markings with mild bibasilar atelectasis. Electronically Signed   By: Aram Candela M.D.   On: 12/02/2021 21:27   ? ?Procedures ?Procedures  ? ? ?Medications Ordered in ED ?Medications  ?thiamine (B-1) injection 100 mg (100 mg  Intravenous Given 12/03/21 0005)  ?LORazepam (ATIVAN) injection 0.5 mg (0.5 mg Intravenous Given 12/03/21 0006)  ? ? ?ED Course/ Medical Decision Making/ A&P ?  ?                        ?Medical Decision Making ?Amount and/or Com

## 2021-12-02 NOTE — Telephone Encounter (Signed)
Message left on clinical intake voicemail:  ? ?Patient was seen this morning and her daughter called requesting to speak with Rebecca Barnes, Rebecca C, NP to discuss patients inability to form complete sentences.  ? ?Called returned to [place]. Rebecca Barnes was not at the appointment this morning due to other commitments.  ? ?Patient was accompanied by her caregiver, who was unable to provide detailed information about today's visit. I was unable to share any information from today's visit as it is not complete. Rebecca Barnes was informed message received and forwarded to Rebecca Barnes, Rebecca Citrin, NP. ? ?Rebecca Barnes states she is looking forward to speaking directly with Rebecca.  ? ? ? ? ?

## 2021-12-02 NOTE — Telephone Encounter (Signed)
Noted  

## 2021-12-02 NOTE — ED Triage Notes (Signed)
Pt having confusion worse over past 2 days ?States not using the correct words in sentences ?Speech garbled  ?BEFAST Neg  ?H/o low NA and h/o stroke  ?Had klonopin prior to arrive r/t anxiety  ? ?

## 2021-12-03 ENCOUNTER — Inpatient Hospital Stay (HOSPITAL_COMMUNITY): Payer: Medicare Other

## 2021-12-03 DIAGNOSIS — Z7984 Long term (current) use of oral hypoglycemic drugs: Secondary | ICD-10-CM | POA: Diagnosis not present

## 2021-12-03 DIAGNOSIS — F32A Depression, unspecified: Secondary | ICD-10-CM | POA: Diagnosis present

## 2021-12-03 DIAGNOSIS — Z8673 Personal history of transient ischemic attack (TIA), and cerebral infarction without residual deficits: Secondary | ICD-10-CM

## 2021-12-03 DIAGNOSIS — E119 Type 2 diabetes mellitus without complications: Secondary | ICD-10-CM

## 2021-12-03 DIAGNOSIS — Z79890 Hormone replacement therapy (postmenopausal): Secondary | ICD-10-CM | POA: Diagnosis not present

## 2021-12-03 DIAGNOSIS — F482 Pseudobulbar affect: Secondary | ICD-10-CM | POA: Diagnosis present

## 2021-12-03 DIAGNOSIS — G479 Sleep disorder, unspecified: Secondary | ICD-10-CM | POA: Diagnosis present

## 2021-12-03 DIAGNOSIS — R2990 Unspecified symptoms and signs involving the nervous system: Secondary | ICD-10-CM

## 2021-12-03 DIAGNOSIS — Z6837 Body mass index (BMI) 37.0-37.9, adult: Secondary | ICD-10-CM | POA: Diagnosis not present

## 2021-12-03 DIAGNOSIS — F1291 Cannabis use, unspecified, in remission: Secondary | ICD-10-CM

## 2021-12-03 DIAGNOSIS — E669 Obesity, unspecified: Secondary | ICD-10-CM | POA: Diagnosis present

## 2021-12-03 DIAGNOSIS — E785 Hyperlipidemia, unspecified: Secondary | ICD-10-CM | POA: Diagnosis present

## 2021-12-03 DIAGNOSIS — R2689 Other abnormalities of gait and mobility: Secondary | ICD-10-CM | POA: Diagnosis present

## 2021-12-03 DIAGNOSIS — I6389 Other cerebral infarction: Secondary | ICD-10-CM | POA: Diagnosis not present

## 2021-12-03 DIAGNOSIS — F419 Anxiety disorder, unspecified: Secondary | ICD-10-CM | POA: Diagnosis present

## 2021-12-03 DIAGNOSIS — Z20822 Contact with and (suspected) exposure to covid-19: Secondary | ICD-10-CM | POA: Diagnosis present

## 2021-12-03 DIAGNOSIS — I69320 Aphasia following cerebral infarction: Secondary | ICD-10-CM | POA: Diagnosis present

## 2021-12-03 DIAGNOSIS — E8809 Other disorders of plasma-protein metabolism, not elsewhere classified: Secondary | ICD-10-CM | POA: Diagnosis present

## 2021-12-03 DIAGNOSIS — E876 Hypokalemia: Secondary | ICD-10-CM | POA: Diagnosis not present

## 2021-12-03 DIAGNOSIS — I639 Cerebral infarction, unspecified: Secondary | ICD-10-CM | POA: Diagnosis not present

## 2021-12-03 DIAGNOSIS — R21 Rash and other nonspecific skin eruption: Secondary | ICD-10-CM | POA: Diagnosis present

## 2021-12-03 DIAGNOSIS — Z882 Allergy status to sulfonamides status: Secondary | ICD-10-CM | POA: Diagnosis not present

## 2021-12-03 DIAGNOSIS — I63512 Cerebral infarction due to unspecified occlusion or stenosis of left middle cerebral artery: Secondary | ICD-10-CM | POA: Diagnosis present

## 2021-12-03 DIAGNOSIS — J45909 Unspecified asthma, uncomplicated: Secondary | ICD-10-CM | POA: Diagnosis present

## 2021-12-03 DIAGNOSIS — R4701 Aphasia: Secondary | ICD-10-CM | POA: Diagnosis present

## 2021-12-03 DIAGNOSIS — Z79899 Other long term (current) drug therapy: Secondary | ICD-10-CM | POA: Diagnosis not present

## 2021-12-03 DIAGNOSIS — I69398 Other sequelae of cerebral infarction: Secondary | ICD-10-CM | POA: Diagnosis not present

## 2021-12-03 DIAGNOSIS — R410 Disorientation, unspecified: Secondary | ICD-10-CM

## 2021-12-03 DIAGNOSIS — R29706 NIHSS score 6: Secondary | ICD-10-CM | POA: Diagnosis present

## 2021-12-03 DIAGNOSIS — I63412 Cerebral infarction due to embolism of left middle cerebral artery: Secondary | ICD-10-CM | POA: Diagnosis not present

## 2021-12-03 DIAGNOSIS — L299 Pruritus, unspecified: Secondary | ICD-10-CM | POA: Diagnosis not present

## 2021-12-03 DIAGNOSIS — I10 Essential (primary) hypertension: Secondary | ICD-10-CM

## 2021-12-03 DIAGNOSIS — Z888 Allergy status to other drugs, medicaments and biological substances status: Secondary | ICD-10-CM | POA: Diagnosis not present

## 2021-12-03 DIAGNOSIS — I69319 Unspecified symptoms and signs involving cognitive functions following cerebral infarction: Secondary | ICD-10-CM | POA: Diagnosis not present

## 2021-12-03 DIAGNOSIS — R451 Restlessness and agitation: Secondary | ICD-10-CM | POA: Diagnosis present

## 2021-12-03 DIAGNOSIS — Z7982 Long term (current) use of aspirin: Secondary | ICD-10-CM | POA: Diagnosis not present

## 2021-12-03 LAB — BASIC METABOLIC PANEL WITH GFR
BUN/Creatinine Ratio: 27 (calc) — ABNORMAL HIGH (ref 6–22)
BUN: 14 mg/dL (ref 7–25)
CO2: 29 mmol/L (ref 20–32)
Calcium: 10.2 mg/dL (ref 8.6–10.4)
Chloride: 99 mmol/L (ref 98–110)
Creat: 0.52 mg/dL — ABNORMAL LOW (ref 0.60–1.00)
Glucose, Bld: 138 mg/dL (ref 65–139)
Potassium: 4.7 mmol/L (ref 3.5–5.3)
Sodium: 137 mmol/L (ref 135–146)
eGFR: 97 mL/min/{1.73_m2} (ref >=60)

## 2021-12-03 LAB — LIPID PANEL
Cholesterol: 227 mg/dL — ABNORMAL HIGH (ref <200)
HDL: 77 mg/dL (ref >=50)
LDL Cholesterol (Calc): 123 mg/dL (calc) — ABNORMAL HIGH
Non-HDL Cholesterol (Calc): 150 mg/dL (calc) — ABNORMAL HIGH (ref <130)
Total CHOL/HDL Ratio: 2.9 (calc) (ref <5.0)
Triglycerides: 155 mg/dL — ABNORMAL HIGH (ref <150)

## 2021-12-03 LAB — CBC WITH DIFFERENTIAL/PLATELET
Absolute Monocytes: 653 cells/uL (ref 200–950)
Basophils Absolute: 32 cells/uL (ref 0–200)
Basophils Relative: 0.5 %
Eosinophils Absolute: 102 cells/uL (ref 15–500)
Eosinophils Relative: 1.6 %
HCT: 43.4 % (ref 35.0–45.0)
Hemoglobin: 13.9 g/dL (ref 11.7–15.5)
Lymphs Abs: 1805 cells/uL (ref 850–3900)
MCH: 26.6 pg — ABNORMAL LOW (ref 27.0–33.0)
MCHC: 32 g/dL (ref 32.0–36.0)
MCV: 83 fL (ref 80.0–100.0)
MPV: 10.4 fL (ref 7.5–12.5)
Monocytes Relative: 10.2 %
Neutro Abs: 3808 cells/uL (ref 1500–7800)
Neutrophils Relative %: 59.5 %
Platelets: 392 10*3/uL (ref 140–400)
RBC: 5.23 10*6/uL — ABNORMAL HIGH (ref 3.80–5.10)
RDW: 13.1 % (ref 11.0–15.0)
Total Lymphocyte: 28.2 %
WBC: 6.4 10*3/uL (ref 3.8–10.8)

## 2021-12-03 LAB — TSH: TSH: 1.05 mIU/L (ref 0.40–4.50)

## 2021-12-03 LAB — COOXEMETRY PANEL
Carboxyhemoglobin: 1.9 % — ABNORMAL HIGH (ref 0.5–1.5)
Methemoglobin: 1.1 % (ref 0.0–1.5)
O2 Saturation: 94.4 %
Total hemoglobin: 14.7 g/dL (ref 12.0–16.0)

## 2021-12-03 LAB — RESP PANEL BY RT-PCR (FLU A&B, COVID) ARPGX2
Influenza A by PCR: NEGATIVE
Influenza B by PCR: NEGATIVE
SARS Coronavirus 2 by RT PCR: NEGATIVE

## 2021-12-03 LAB — CBG MONITORING, ED: Glucose-Capillary: 134 mg/dL — ABNORMAL HIGH (ref 70–99)

## 2021-12-03 LAB — HEMOGLOBIN A1C
Hgb A1c MFr Bld: 6.8 % of total Hgb — ABNORMAL HIGH (ref <5.7)
Mean Plasma Glucose: 148 mg/dL
eAG (mmol/L): 8.2 mmol/L

## 2021-12-03 LAB — GLUCOSE, CAPILLARY
Glucose-Capillary: 131 mg/dL — ABNORMAL HIGH (ref 70–99)
Glucose-Capillary: 147 mg/dL — ABNORMAL HIGH (ref 70–99)

## 2021-12-03 IMAGING — MR MR HEAD W/O CM
12 of 13 series · 44 of 48 positions shown · non-contrast
Comparison: Prior CT from [DATE].

CLINICAL DATA: Follow-up examination for acute stroke.

EXAM:
MRI HEAD WITHOUT CONTRAST
TECHNIQUE: Multiplanar, multiecho pulse sequences of the brain and surrounding
structures were obtained without intravenous contrast.

[Series 5: DWI · axial · 3.0mm · 0.88mm/px · z∈[-98,+52]mm · 8 of 102 slices shown (1 of 4)]
[im 1/102]
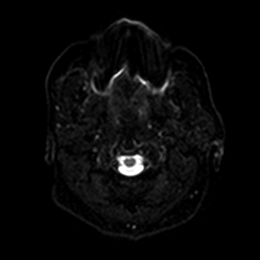
[im 15/102]
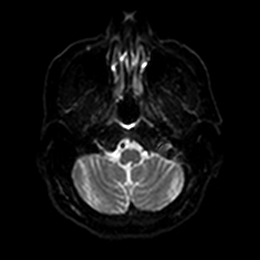
[im 29/102]
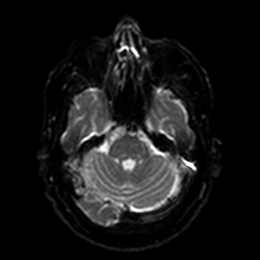
[im 44/102]
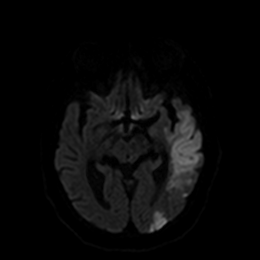
[im 58/102]
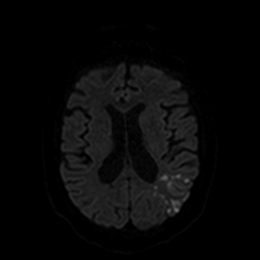
[im 73/102]
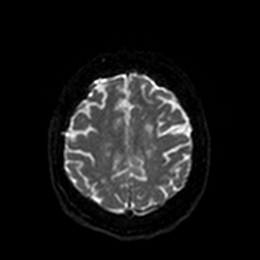
[im 87/102]
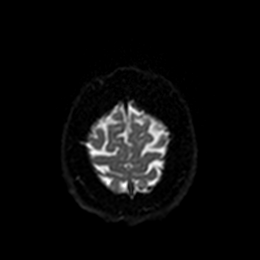
[im 102/102]
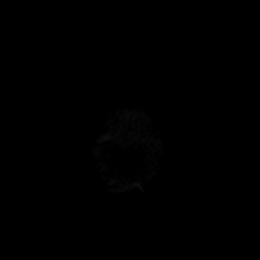

[Series 6: DWI · axial · 3.0mm · 0.88mm/px · z∈[-98,+52]mm · 4 of 51 slices shown (2 of 4)]
[im 1/51]
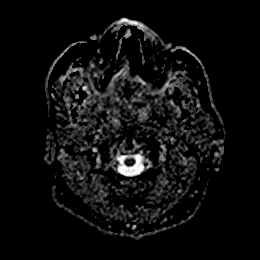
[im 17/51]
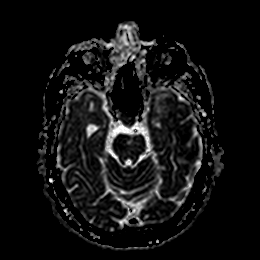
[im 34/51]
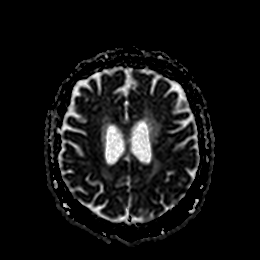
[im 51/51]
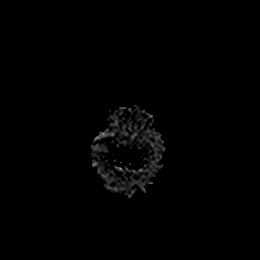

[Series 7: DWI · coronal · 4.0mm · 0.88mm/px · 5 of 68 slices shown (3 of 4)]
[im 1/68]
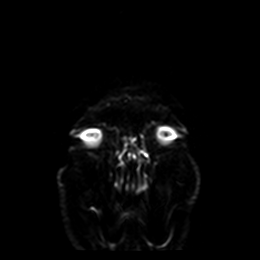
[im 17/68]
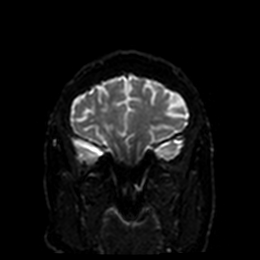
[im 34/68]
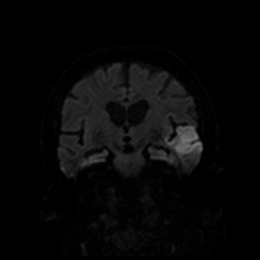
[im 51/68]
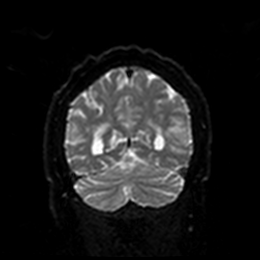
[im 68/68]
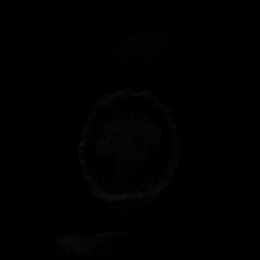

[Series 8: DWI · coronal · 4.0mm · 0.88mm/px · 3 of 34 slices shown (4 of 4)]
[im 1/34]
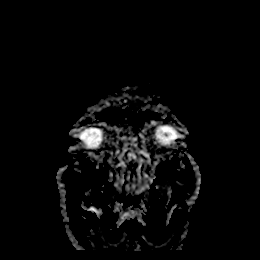
[im 17/34]
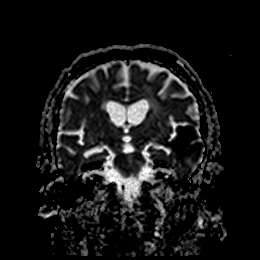
[im 34/34]
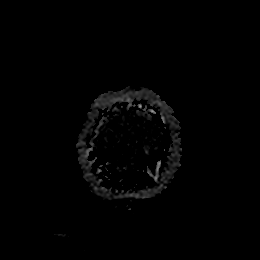

[Series 9: FLAIR · axial · 5.0mm · 0.45mm/px · z∈[-101,+54]mm · 2 of 27 slices shown]
[im 1/27]
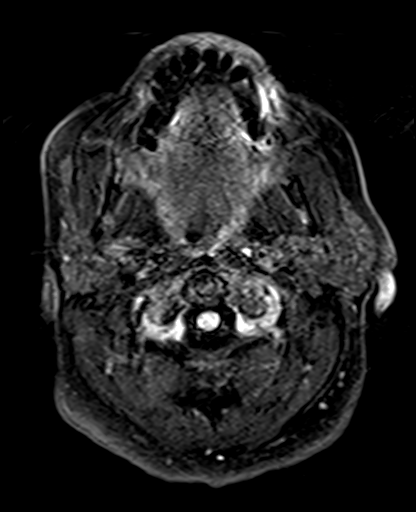
[im 27/27]
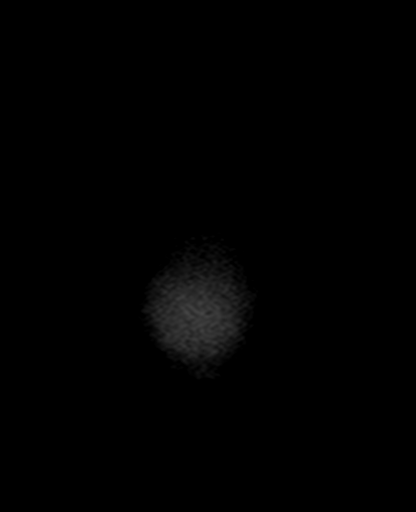

[Series 10: mag_images · axial · 3.0mm · 0.90mm/px · z∈[-100,+53]mm · 4 of 52 slices shown]
[im 1/52]
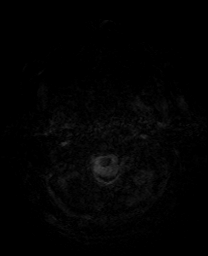
[im 18/52]
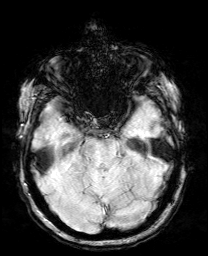
[im 35/52]
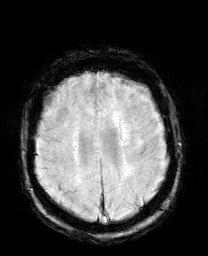
[im 52/52]
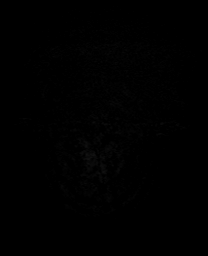

[Series 11: pha_images · axial · 3.0mm · 0.90mm/px · z∈[-97,+47]mm · 4 of 49 slices shown]
[im 1/49]
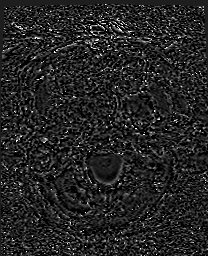
[im 17/49]
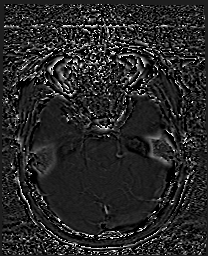
[im 33/49]
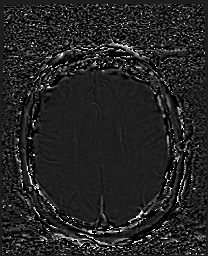
[im 49/49]
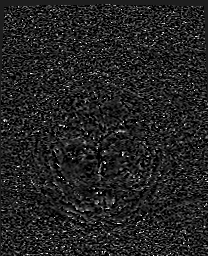

[Series 12: swi_images · axial · 3.0mm · 0.90mm/px · z∈[-100,+53]mm · 4 of 52 slices shown]
[im 1/52]
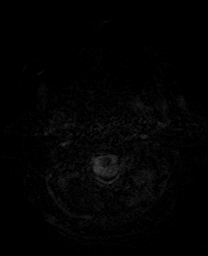
[im 18/52]
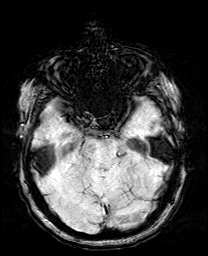
[im 35/52]
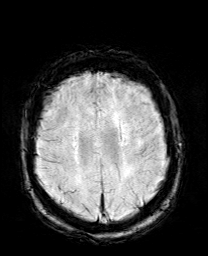
[im 52/52]
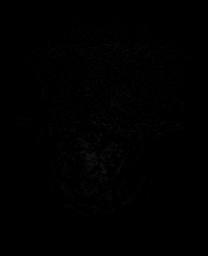

[Series 13: mip_images(sw) · axial · 24.0mm · 0.90mm/px · z∈[-89,+42]mm · 4 of 45 slices shown]
[im 1/45]
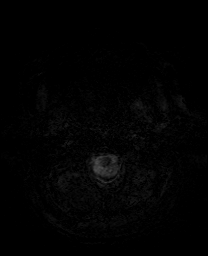
[im 15/45]
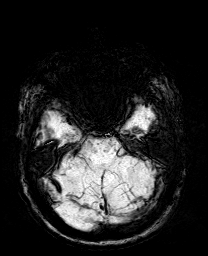
[im 30/45]
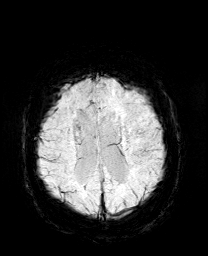
[im 45/45]
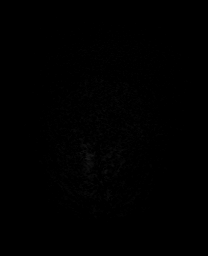

[Series 14: T1 · sagittal · 5.0mm · 0.75mm/px · 2 of 23 slices shown]
[im 1/23]
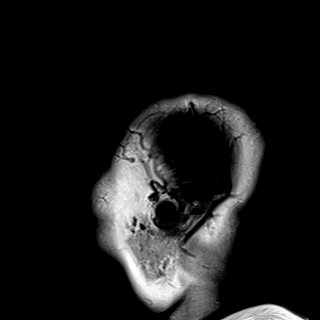
[im 23/23]
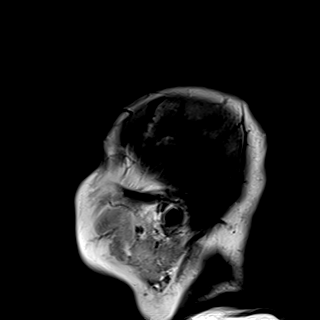

[Series 15: T2 · axial · 5.0mm · 0.72mm/px · z∈[-101,+55]mm · 2 of 27 slices shown (1 of 2)]
[im 1/27]
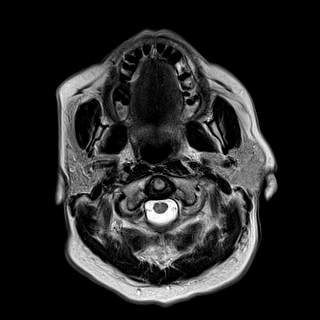
[im 27/27]
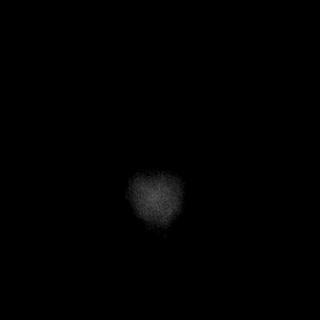

[Series 17: T2 · coronal · 5.0mm · 0.34mm/px · 2 of 29 slices shown (2 of 2)]
[im 1/29]
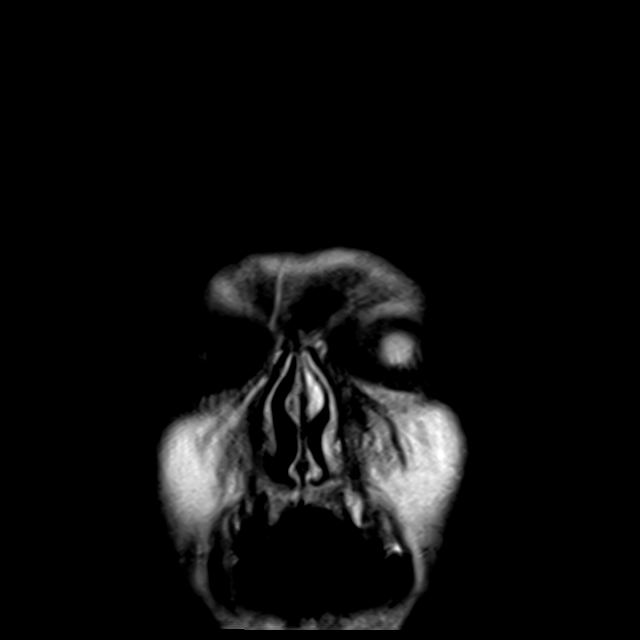
[im 29/29]
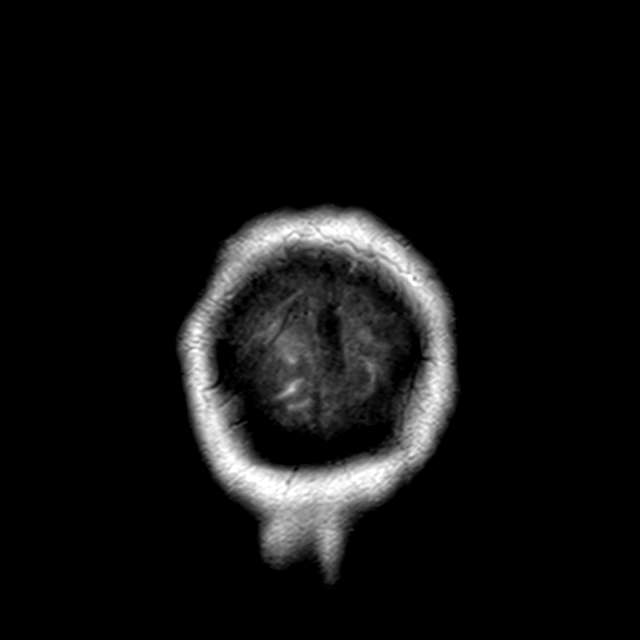

[44 of 48 positions shown; findings below may reference images not displayed]

FINDINGS: Brain: Diffuse prominence of the CSF containing spaces compatible
generalized age-related cerebral atrophy. Patchy T2/FLAIR
hyperintensity involving the periventricular deep white matter both
cerebral hemispheres most consistent with chronic small vessel
ischemic disease, moderately advanced in nature. Multiple scattered
remote lacunar infarcts present about the bilateral basal
ganglia/corona radiata, left thalamus, and left ventral pons.

Patchy and confluent restricted diffusion involving the left
temporal occipital region, with extension to involve the underlying
left periatrial white matter, consistent with acute left MCA
distribution infarct. Mild patchy involvement of the left insula
noted. No associated hemorrhage or significant regional mass effect.
No other evidence for acute or subacute ischemia. Gray-white matter
differentiation otherwise maintained. No other acute or chronic
intracranial blood products.

No mass lesion, midline shift or mass effect. No hydrocephalus or
extra-axial fluid collection. Pituitary gland suprasellar region
within normal limits. Midline structures intact.

Vascular: Major intracranial vascular flow voids are maintained.

Skull and upper cervical spine: Craniocervical junction within
normal limits. Bone marrow signal intensity normal. No scalp soft
tissue abnormality.

Sinuses/Orbits: Globes and orbital soft tissues within normal
limits. Paranasal sinuses are largely clear. No significant mastoid
effusion. Inner ear structures grossly normal.

Other: None.
IMPRESSION: 1. Moderate-sized acute left MCA distribution infarct involving the
left temporal occipital region as above. No associated hemorrhage or
significant regional mass effect.
2. Underlying age-related cerebral atrophy with moderate chronic
microvascular ischemic disease, with multiple scattered remote
lacunar infarcts involving the bilateral basal ganglia/corona
radiata, left thalamus, and left ventral pons.

## 2021-12-03 MED ORDER — STROKE: EARLY STAGES OF RECOVERY BOOK
Freq: Once | Status: AC
  Filled 2021-12-03: qty 1

## 2021-12-03 MED ORDER — ACETAMINOPHEN 325 MG PO TABS
650.0000 mg | ORAL_TABLET | Freq: Four times a day (QID) | ORAL | Status: DC | PRN
  Administered 2021-12-06: 650 mg via ORAL
  Filled 2021-12-03: qty 2

## 2021-12-03 MED ORDER — ADULT MULTIVITAMIN W/MINERALS CH
1.0000 | ORAL_TABLET | Freq: Every day | ORAL | Status: DC
  Administered 2021-12-04 – 2021-12-06 (×3): 1 via ORAL
  Filled 2021-12-03 (×3): qty 1

## 2021-12-03 MED ORDER — ASPIRIN EC 81 MG PO TBEC: 81.0000 mg | DELAYED_RELEASE_TABLET | Freq: Every day | ORAL | Status: DC

## 2021-12-03 MED ORDER — ENOXAPARIN SODIUM 40 MG/0.4ML IJ SOSY
40.0000 mg | PREFILLED_SYRINGE | INTRAMUSCULAR | Status: DC
  Administered 2021-12-03 – 2021-12-05 (×3): 40 mg via SUBCUTANEOUS
  Filled 2021-12-03 (×3): qty 0.4

## 2021-12-03 MED ORDER — ONDANSETRON HCL 4 MG/2ML IJ SOLN
4.0000 mg | Freq: Four times a day (QID) | INTRAMUSCULAR | Status: DC | PRN
Start: 2021-12-03 — End: 2021-12-06

## 2021-12-03 MED ORDER — SODIUM CHLORIDE 1 G PO TABS
1.0000 g | ORAL_TABLET | Freq: Three times a day (TID) | ORAL | Status: DC
  Administered 2021-12-04 – 2021-12-06 (×7): 1 g via ORAL
  Filled 2021-12-03 (×7): qty 1

## 2021-12-03 MED ORDER — FLUOXETINE HCL 20 MG PO CAPS
20.0000 mg | ORAL_CAPSULE | Freq: Every day | ORAL | Status: DC
  Administered 2021-12-04 – 2021-12-06 (×3): 20 mg via ORAL
  Filled 2021-12-03 (×3): qty 1

## 2021-12-03 MED ORDER — ASPIRIN 325 MG PO TABS
325.0000 mg | ORAL_TABLET | Freq: Every day | ORAL | Status: DC
  Administered 2021-12-04: 325 mg via ORAL
  Filled 2021-12-03: qty 1

## 2021-12-03 MED ORDER — ORAL CARE MOUTH RINSE
15.0000 mL | Freq: Two times a day (BID) | OROMUCOSAL | Status: DC
  Administered 2021-12-03 – 2021-12-06 (×6): 15 mL via OROMUCOSAL

## 2021-12-03 MED ORDER — ASPIRIN 300 MG RE SUPP: 300.0000 mg | Freq: Every day | RECTAL | Status: DC

## 2021-12-03 MED ORDER — CLONAZEPAM 0.5 MG PO TABS: 0.5000 mg | ORAL_TABLET | Freq: Two times a day (BID) | ORAL | Status: DC | PRN

## 2021-12-03 MED ORDER — MELATONIN 5 MG PO TABS
10.0000 mg | ORAL_TABLET | Freq: Every day | ORAL | Status: DC
  Administered 2021-12-04 – 2021-12-05 (×2): 10 mg via ORAL
  Filled 2021-12-03 (×2): qty 2

## 2021-12-03 MED ORDER — ONDANSETRON HCL 4 MG PO TABS: 4.0000 mg | ORAL_TABLET | Freq: Four times a day (QID) | ORAL | Status: DC | PRN

## 2021-12-03 MED ORDER — ACETAMINOPHEN 650 MG RE SUPP: 650.0000 mg | Freq: Four times a day (QID) | RECTAL | Status: DC | PRN

## 2021-12-03 MED ORDER — HALOPERIDOL LACTATE 5 MG/ML IJ SOLN
5.0000 mg | Freq: Once | INTRAMUSCULAR | Status: AC
Start: 2021-12-03 — End: 2021-12-03
  Administered 2021-12-03: 5 mg via INTRAVENOUS
  Filled 2021-12-03: qty 1

## 2021-12-03 MED ORDER — LORAZEPAM 2 MG/ML IJ SOLN
1.0000 mg | Freq: Once | INTRAMUSCULAR | Status: AC
  Administered 2021-12-03: 1 mg via INTRAVENOUS
  Filled 2021-12-03: qty 1

## 2021-12-03 NOTE — ED Provider Notes (Signed)
4:04 AM ?Patient sleeping peacefully after Ativan and Haldol.  These were given after patient became very agitated and posed a threat to herself (fall risk, climbing out of bed) and staff (agitated and potentially combative). ? ? ?  ?Bing Duffey, MD ?12/03/21 0405 ? ?

## 2021-12-03 NOTE — ED Notes (Signed)
Dtg has left, would like to be notified when pt is transferred to [facility].   ?

## 2021-12-03 NOTE — ED Notes (Signed)
[facility] at bedside Handoff report given 

## 2021-12-03 NOTE — Progress Notes (Signed)
[facility] SW admits paged to make them aware that [facility].  ?

## 2021-12-03 NOTE — ED Notes (Signed)
Patient resting with eyes closed, resps even and unlabored. BP , cardiac, and resp monitor remain off for patient comfort. ?

## 2021-12-03 NOTE — ED Notes (Signed)
Patient resting with eyes closed,resps even/unlabored, no distress noted. VS stable. ?

## 2021-12-03 NOTE — ED Notes (Signed)
At 0230, patient was found sitting up at end of the bed. Patient was confused, trying to push past staff, stating she "had to get out of here." Patient became increasingly combative and belligerent, striking out at staff. Patient was assisted to [facility], bed lines changed, clean depends placed on patient. Patient asked repeatedly what was happening. Staff attempted to reassure pt. Patient became increasingly combative, continuing to strike out at staff. Dr. Read Drivers called to room and orders received for medication. After patient became calmer she was moved to room 9 so she could be more easily visualized. Staff remained with patient until she went to sleep. ?

## 2021-12-03 NOTE — H&P (Signed)
?History and Physical  ? ? ?Rebecca Barnes NWG:[id number] DOB: Sep 05, 1945 DOA: 12/02/2021 ? ?PCP: Ngetich, Donalee Citrininah C, NP ?Patient coming from: Home ? ?Chief Complaint: word salad ? ?HPI: Rebecca RavelMaryann Spatafore is a 76 y.o. female with medical history significant of previous CVAs, anxiety, T2DM, HTN, Asthma and previous marijuana abuse who was transferred from OSH ED for the evaluation for word salad. ? ?Patient is unable to provide history, the detailed H&P is obtained by talking to patient's daughter over the phone, and chart review. ? ?Patient has had a history of marijuana abuse up until January 2023 when she moved in with her daughter.  She is currently living with her daughter.  Patient was noted to have increased forgetfulness, and progressive worsening of word salad since 11/30/2021.  Patient's daughter reports that patient still recognizes her family members and follows simple commands. But her words did not make any sense when patient speaks.  Patient could not give details on review of system.  But daughter did not notice any other positive symptoms.  Patient went to OSH ED for evaluation.  She was hemodynamically stable.  CBC and CMP nonrevealing.  UA negative.  UDS negative.  Alcohol level negative.  CXR showed no acute changes.  Head CT showed no acute changes.  [facility]. ? ? ?Review of Systems: As per HPI otherwise 10 point review of systems negative.  ?Review of Systems ?Unable to provide a complete review of system ?Positive for word salad, expressive aphasia ? ?Past Medical History:  ?Diagnosis Date  ? Anxiety   ? Asthma   ? Diabetes mellitus without complication (HCC)   ? Hypertension   ? Stroke [facility](HCC)   ? ? ?Past Surgical History:  ?Procedure Laterality Date  ? arm fracture    ? arm surgery  ? CHOLECYSTECTOMY    ? ? ?SOCIAL HISTORY: ? reports that she has never smoked. She has never used smokeless tobacco. She reports that she does not currently use drugs  after having used the following drugs: Marijuana. She reports that she does not drink alcohol. ? ?Allergies  ?Allergen Reactions  ? Januvia [Sitagliptin] Other (See Comments)  ?  dizziness  ? Statins Other (See Comments)  ?  Hospitalized on 2 occasions after trying statins. Patient became dizzy and increased fall risk  ? Sulfa Antibiotics Other (See Comments)  ?  Childhood Allergy   ? ? ?FAMILY HISTORY: ?Family History  ?Problem Relation Age of Onset  ? Cancer Mother   ? ? ? ?Prior to Admission medications   ?Medication Sig Start Date End Date Taking? Authorizing Provider  ?acetaminophen (TYLENOL) 325 MG tablet Take 650 mg by mouth every 6 (six) hours as needed for mild pain or headache. 06/13/20  Yes [provider]  ?amLODipine (NORVASC) 5 MG tablet TAKE 1 TABLET(5 MG) BY MOUTH DAILY ?Patient taking differently: Take 5 mg by mouth daily. 08/28/21  Yes Ngetich, Dinah C, NP  ?aspirin EC 81 MG tablet Take 1 tablet (81 mg total) by mouth daily. Swallow whole. 04/09/21  Yes [name], Monina C, NP  ?clonazePAM (KLONOPIN) 0.5 MG tablet Take 0.5 mg by mouth 2 (two) times daily as needed for anxiety. 08/25/21  Yes [provider]  ?FLUoxetine (PROZAC) 20 MG capsule Take one capsule by mouth once daily. ?Patient taking differently: Take 20 mg by mouth daily. 07/13/21  Yes Ngetich, Dinah C, NP  ?losartan (COZAAR) 100 MG tablet TAKE 1 TABLET(100 MG) BY MOUTH DAILY ?Patient taking  differently: Take 100 mg by mouth daily. 08/28/21  Yes Ngetich, Donalee Citrin, NP  ?Melatonin 10 MG CAPS Take 10 mg by mouth at bedtime.   Yes [provider]  ?metFORMIN (GLUCOPHAGE XR) 500 MG 24 hr tablet Take two tablet by mouth daily. ?Patient taking differently: Take 1,000 mg by mouth daily. 07/13/21  Yes Ngetich, Donalee Citrin, NP  ?Multiple Vitamin (MULTI-VITAMIN) tablet Take 1 tablet by mouth daily. 04/09/21  Yes [name], Monina C, NP  ?sodium chloride 1 g tablet TAKE 1 TABLET(1 GRAM) BY MOUTH THREE TIMES DAILY ?Patient  taking differently: Take 1 g by mouth 3 (three) times daily with meals. 11/30/21  Yes Ngetich, Dinah C, NP  ? ? ?Physical Exam: ?Vitals:  ? 12/03/21 1345 12/03/21 1430 12/03/21 1451 12/03/21 1614  ?BP: (!) 122/104 (!) 160/95 (!) 151/80 (!) 142/79  ?Pulse: 91 (!) 107 99 99  ?Resp:  16 16 16   ?Temp:   98 ?F (36.7 ?C) 97.9 ?F (36.6 ?C)  ?TempSrc:   Oral Oral  ?SpO2: 91% 91% 92% 94%  ?Weight:      ?Height:      ? ? ? ? ?Constitutional: NAD, calm, comfortable ?Eyes: PERRL, lids and conjunctivae normal ?ENMT: Mucous membranes are moist. Posterior pharynx clear of any exudate or lesions.Normal dentition.  ?Neck: normal, supple, no masses, no thyromegaly ?Respiratory: clear to auscultation bilaterally, no wheezing, no crackles. Normal respiratory effort. No accessory muscle use.  ?Cardiovascular: Regular rate and rhythm, no murmurs / rubs / gallops. No extremity edema. 2+ pedal pulses. No carotid bruits.  ?Abdomen: no tenderness, no masses palpated. No hepatosplenomegaly. Bowel sounds positive.  ?Musculoskeletal: no clubbing / cyanosis. No joint deformity upper and lower extremities. Good ROM, no contractures. Normal muscle tone.  ?Skin: no rashes, lesions, ulcers. No induration ?Neurologic: Alert and oriented x3.  Follow simple commands.  Word salad and expressive aphasia noted CN 2-12 grossly intact. Sensation intact, DTR normal. Strength 5/5 in all 4.  ?Psychiatric: Normal judgment and insight. Alert and oriented x 3. Normal mood.  ? ? ? ?Labs on Admission: I have personally reviewed following labs and imaging studies ? ?CBC: ?Recent Labs  ?Lab 12/02/21 ?1103 12/02/21 ?1904 12/02/21 ?2124  ?WBC 6.4 9.0  --   ?NEUTROABS 3,808 5.2  --   ?HGB 13.9 13.9 12.6  ?HCT 43.4 42.5 37.0  ?MCV 83.0 80.8  --   ?PLT 392 372  --   ? ?Basic Metabolic Panel: ?Recent Labs  ?Lab 12/02/21 ?1103 12/02/21 ?1904 12/02/21 ?2124  ?Casady Voshell 137 134* 138  ?K 4.7 3.6 3.5  ?CL 99 99  --   ?CO2 29 24  --   ?GLUCOSE 138 137*  --   ?BUN 14 15  --   ?CREATININE  0.52* 0.57  --   ?CALCIUM 10.2 10.3  --   ? ?GFR: ?Estimated Creatinine Clearance: 72.4 mL/min (by C-G formula based on SCr of 0.57 mg/dL). ?Liver Function Tests: ?Recent Labs  ?Lab 12/02/21 ?1904  ?AST 14*  ?ALT 12  ?ALKPHOS 94  ?BILITOT 0.4  ?PROT 8.1  ?ALBUMIN 4.5  ? ?No results for input(s): LIPASE, AMYLASE in the last 168 hours. ?Recent Labs  ?Lab 12/02/21 ?2118  ?AMMONIA 37*  ? ?Coagulation Profile: ?Recent Labs  ?Lab 12/02/21 ?1904  ?INR 1.0  ? ?Cardiac Enzymes: ?No results for input(s): CKTOTAL, CKMB, CKMBINDEX, TROPONINI in the last 168 hours. ?BNP (last 3 results) ?No results for input(s): PROBNP in the last 8760 hours. ?HbA1C: ?Recent Labs  ?  12/02/21 ?  1103  ?HGBA1C 6.8*  ? ?CBG: ?Recent Labs  ?Lab 12/02/21 ?2113 12/03/21 ?1517 12/03/21 ?1724  ?GLUCAP 132* 134* 131*  ? ?Lipid Profile: ?Recent Labs  ?  12/02/21 ?1103  ?CHOL 227*  ?HDL 77  ?LDLCALC 123*  ?TRIG 155*  ?CHOLHDL 2.9  ? ?Thyroid Function Tests: ?Recent Labs  ?  12/02/21 ?1103  ?TSH 1.05  ? ?Anemia Panel: ?No results for input(s): VITAMINB12, FOLATE, FERRITIN, TIBC, IRON, RETICCTPCT in the last 72 hours. ?Urine analysis: ?   ?Component Value Date/Time  ? COLORURINE COLORLESS (A) 12/02/2021 2118  ? APPEARANCEUR CLEAR 12/02/2021 2118  ? LABSPEC 1.007 12/02/2021 2118  ? PHURINE 6.5 12/02/2021 2118  ? GLUCOSEU NEGATIVE 12/02/2021 2118  ? HGBUR NEGATIVE 12/02/2021 2118  ? BILIRUBINUR NEGATIVE 12/02/2021 2118  ? KETONESUR NEGATIVE 12/02/2021 2118  ? PROTEINUR NEGATIVE 12/02/2021 2118  ? NITRITE NEGATIVE 12/02/2021 2118  ? LEUKOCYTESUR NEGATIVE 12/02/2021 2118  ? ?Sepsis Labs: !!!!!!!!!!!!!!!!!!!!!!!!!!!!!!!!!!!!!!!!!!!! ?@[email or web address] (procalcitonin:4,lacticidven:4) ?)No results found for this or any previous visit (from the past 240 hour(s)).  ? ?Radiological Exams on Admission: ?CT HEAD WO CONTRAST ? ?Result Date: 12/02/2021 ?CLINICAL DATA:  Altered mental status. EXAM: CT HEAD WITHOUT CONTRAST TECHNIQUE: Contiguous axial images were obtained from the  base of the skull through the vertex without intravenous contrast. RADIATION DOSE REDUCTION: This exam was performed according to the departmental dose-optimization program which includes automated exposure co

## 2021-12-03 NOTE — Consult Note (Signed)
Neurology Consultation ?Reason for Consult: Aphasia ?Referring Physician: Nicoletta Dress, Na ? ?CC: Aphasia ? ?History is obtained from: Chart review (limited from patient due to aphasia) ? ?HPI: [name] is a 76 y.o. female with a history of diabetes, hypertension, previous stroke who has been having symptoms for at least 2 days when she presented to the emergency department at [name] yesterday.  Due to her symptoms, she was admitted to [facility].  She apparently had a history of stroke, and the symptoms that she presented with were similar to her previous stroke, with just much worse.  There is question of whether this is recrudescence versus new stroke and she had an MRI this evening which does reveal a fairly large new ischemic infarct. ? ? ?LKW: 4/10 ?tpa given?: no, outside of window ? ? ?ROS: A 14 point ROS was performed and is negative except as noted in the HPI.  ?Past Medical History:  ?Diagnosis Date  ? Anxiety   ? Asthma   ? Diabetes mellitus without complication ([place])   ? Hypertension   ? Stroke [facility])   ? ? ? ?Family History  ?Problem Relation Age of Onset  ? Cancer Mother   ? ? ? ?Social History:  reports that she has never smoked. She has never used smokeless tobacco. She reports that she does not currently use drugs after having used the following drugs: Marijuana. She reports that she does not drink alcohol. ? ? ?Exam: ?Current vital signs: ?BP (!) 182/89 (BP Location: Left Arm)   Pulse 100   Temp 98.6 ?F (37 ?C) (Oral)   Resp 19   Ht 5\' 5"  (1.651 m)   Wt 103.2 kg   SpO2 94%   BMI 37.86 kg/m?  ?Vital signs in last 24 hours: ?Temp:  [97.9 ?F (36.6 ?C)-98.6 ?F (37 ?C)] 98.6 ?F (82 ?C) (04/13 1927) ?Pulse Rate:  [91-112] 100 (04/13 1927) ?Resp:  [16-23] 19 (04/13 1927) ?BP: (103-182)/(61-104) 182/89 (04/13 1927) ?SpO2:  [90 %-99 %] 94 % (04/13 1927) ? ? ?Physical Exam  ?Constitutional: Appears well-developed and well-nourished.  ?Psych: Affect appropriate to situation ?Eyes: No  scleral injection ?HENT: No OP obstruction ?MSK: no joint deformities.  ?Cardiovascular: Normal rate and regular rhythm.  ?Respiratory: Effort normal, non-labored breathing ?GI: Soft.  No distension. There is no tenderness.  ?Skin: WDI ? ?Neuro: ?Mental Status: ?Patient is awake, alert, she is unable to answer orientation questions.  When I have her count fingers, she is able to initially give the number that I present, but has some perseveration.  She is able to follow some simple commands, though does better with physical prompting and is unable to follow any type of complex command. ?Cranial Nerves: ?II: Visual Fields are full. Pupils are equal, round, and reactive to light.   ?III,IV, VI: EOMI without ptosis or diploplia.  ?V: Facial sensation is symmetric to temperature ?VII: Facial movement with decreased right nasolabial fold ?VIII: hearing is intact to voice ?X: Uvula elevates symmetrically ?XI: Shoulder shrug is symmetric. ?XII: tongue is midline without atrophy or fasciculations.  ?Motor: ?Tone is normal. Bulk is normal. 5/5 strength was present in all four extremities.  ?Sensory: ?Sensation is symmetric to light touch and temperature in the arms and legs. ?Cerebellar: ?FNF and HKS are intact bilaterally ? ? ?I have reviewed labs in epic and the results pertinent to this consultation are: ?Cr 0.57 ? ?I have reviewed the images obtained: MRI brain-fairly large left temporal stroke ? 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  Her clinical exam currently matches her ischemia on MRI and she is outside the window for any type of intervention.  She will need vascular imaging as this does appear to be a large vessel distribution and she will need further work-up. ? ?Recommendations: ?- HgbA1c, fasting lipid panel ?- Frequent neuro checks ?- Echocardiogram ?- CTA head and neck ?- Prophylactic therapy-Antiplatelet med: Aspirin - dose 81mg  daily ?- Risk factor  modification ?- Telemetry monitoring ?- PT consult, OT consult, Speech consult ?- Stroke team to follow ? ? ? ?Roland Rack, MD ?[facility] ?[phone] ? ?If 7pm- 7am, please page neurology on call as listed in [place]. ? ?

## 2021-12-03 NOTE — ED Notes (Signed)
Handoff report given to [place] RN on 5W at [facility]  ?

## 2021-12-03 NOTE — ED Notes (Signed)
Daughter called and updated.  Given room number and nursing station phone number ?

## 2021-12-04 ENCOUNTER — Inpatient Hospital Stay (HOSPITAL_COMMUNITY): Payer: Medicare Other

## 2021-12-04 DIAGNOSIS — R4701 Aphasia: Secondary | ICD-10-CM | POA: Diagnosis not present

## 2021-12-04 DIAGNOSIS — I10 Essential (primary) hypertension: Secondary | ICD-10-CM | POA: Diagnosis not present

## 2021-12-04 DIAGNOSIS — R2990 Unspecified symptoms and signs involving the nervous system: Secondary | ICD-10-CM | POA: Diagnosis not present

## 2021-12-04 DIAGNOSIS — I6389 Other cerebral infarction: Secondary | ICD-10-CM | POA: Diagnosis not present

## 2021-12-04 DIAGNOSIS — Z8673 Personal history of transient ischemic attack (TIA), and cerebral infarction without residual deficits: Secondary | ICD-10-CM | POA: Diagnosis not present

## 2021-12-04 LAB — ECHOCARDIOGRAM COMPLETE
AR max vel: 2.62 cm2
AV Area VTI: 2.54 cm2
AV Area mean vel: 2.53 cm2
AV Mean grad: 4 mmHg
AV Peak grad: 7.6 mmHg
Ao pk vel: 1.38 m/s
Area-P 1/2: 4.89 cm2
Height: 65 in
S' Lateral: 3 cm
Weight: 3640.24 oz

## 2021-12-04 LAB — CBC
HCT: 42.6 % (ref 36.0–46.0)
Hemoglobin: 14.2 g/dL (ref 12.0–15.0)
MCH: 27.1 pg (ref 26.0–34.0)
MCHC: 33.3 g/dL (ref 30.0–36.0)
MCV: 81.3 fL (ref 80.0–100.0)
Platelets: 388 10*3/uL (ref 150–400)
RBC: 5.24 MIL/uL — ABNORMAL HIGH (ref 3.87–5.11)
RDW: 13.4 % (ref 11.5–15.5)
WBC: 9.2 10*3/uL (ref 4.0–10.5)
nRBC: 0 % (ref 0.0–0.2)

## 2021-12-04 LAB — HEMOGLOBIN A1C
Hgb A1c MFr Bld: 6.3 % — ABNORMAL HIGH (ref 4.8–5.6)
Mean Plasma Glucose: 134.11 mg/dL

## 2021-12-04 LAB — BASIC METABOLIC PANEL
Anion gap: 10 (ref 5–15)
BUN: 11 mg/dL (ref 8–23)
CO2: 24 mmol/L (ref 22–32)
Calcium: 9.2 mg/dL (ref 8.9–10.3)
Chloride: 100 mmol/L (ref 98–111)
Creatinine, Ser: 0.51 mg/dL (ref 0.44–1.00)
GFR, Estimated: >60 mL/min (ref >60)
Glucose, Bld: 122 mg/dL — ABNORMAL HIGH (ref 70–99)
Potassium: 3.1 mmol/L — ABNORMAL LOW (ref 3.5–5.1)
Sodium: 134 mmol/L — ABNORMAL LOW (ref 135–145)

## 2021-12-04 LAB — LIPID PANEL
Cholesterol: 222 mg/dL — ABNORMAL HIGH (ref 0–200)
HDL: 67 mg/dL (ref >40)
LDL Cholesterol: 122 mg/dL — ABNORMAL HIGH (ref 0–99)
Total CHOL/HDL Ratio: 3.3 RATIO
Triglycerides: 163 mg/dL — ABNORMAL HIGH (ref <150)
VLDL: 33 mg/dL (ref 0–40)

## 2021-12-04 LAB — GAMMA GT: GGT: 20 U/L (ref 7–50)

## 2021-12-04 LAB — GLUCOSE, CAPILLARY
Glucose-Capillary: 140 mg/dL — ABNORMAL HIGH (ref 70–99)
Glucose-Capillary: 144 mg/dL — ABNORMAL HIGH (ref 70–99)
Glucose-Capillary: 140 mg/dL — ABNORMAL HIGH (ref 70–99)
Glucose-Capillary: 143 mg/dL — ABNORMAL HIGH (ref 70–99)

## 2021-12-04 LAB — URIC ACID: Uric Acid, Serum: 5.2 mg/dL (ref 2.5–7.1)

## 2021-12-04 LAB — LACTATE DEHYDROGENASE: LDH: 127 U/L (ref 98–192)

## 2021-12-04 LAB — CK: Total CK: 124 U/L (ref 38–234)

## 2021-12-04 IMAGING — CT CT ANGIO HEAD
2 of 11 series · 7 of 35 positions shown · non-contrast
Comparison: Brain MRI [DATE]. Prior head CT examinations
[DATE] and earlier.

CLINICAL DATA: Stroke, follow-up.

EXAM:
CT ANGIOGRAPHY HEAD AND NECK
TECHNIQUE: Multidetector CT imaging of the head and neck was performed using
the standard protocol during bolus administration of intravenous
contrast. Multiplanar CT image reconstructions and MIPs were
obtained to evaluate the vascular anatomy. Carotid stenosis
measurements (when applicable) are obtained utilizing NASCET
criteria, using the distal internal carotid diameter as the
denominator.

[Series 9: cta neck · axial · 0.53mm/px · z∈[-331,-215]mm · 2 of 176 slices shown]
[im 59/176  soft-tissue]
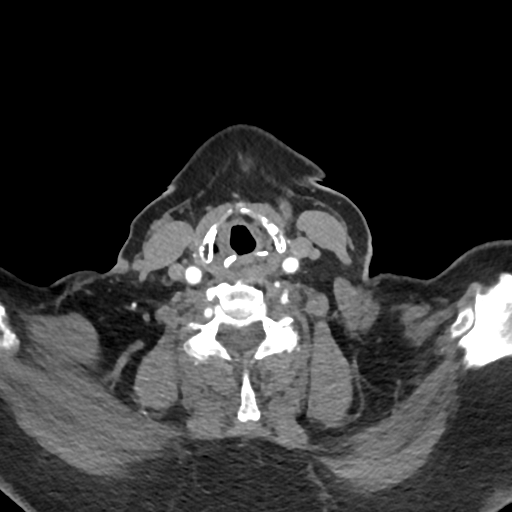
[im 117/176  bone]
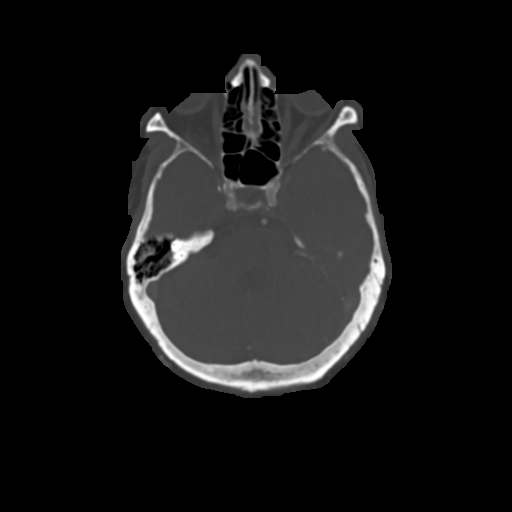

[Series 11: cta neck axial · axial · 0.39mm/px · z∈[-411,-191]mm · 5 of 350 slices shown]
[im 59/350  soft-tissue]
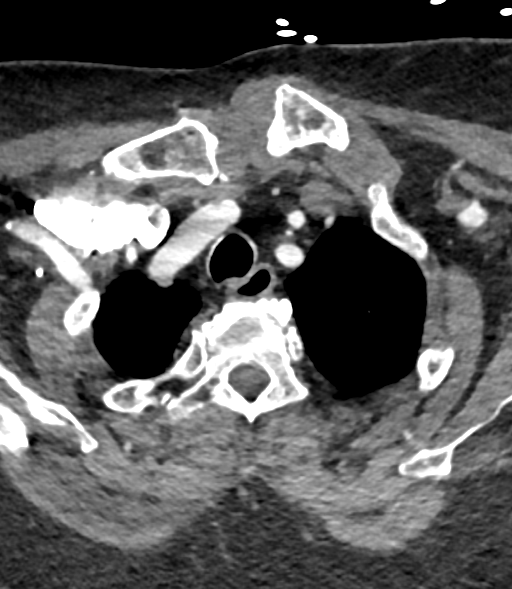
[im 117/350  soft-tissue]
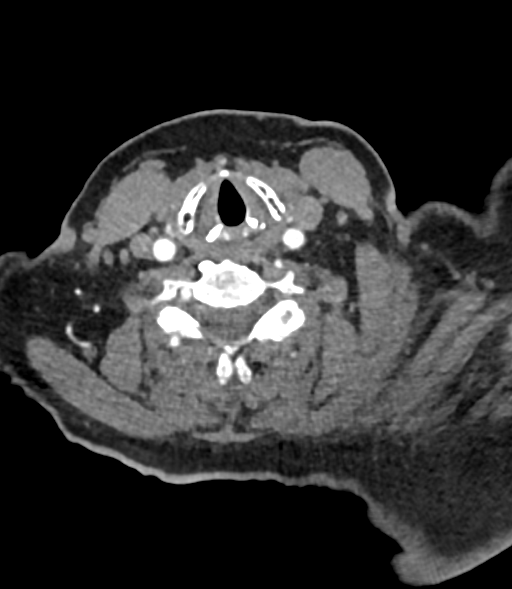
[im 175/350  soft-tissue]
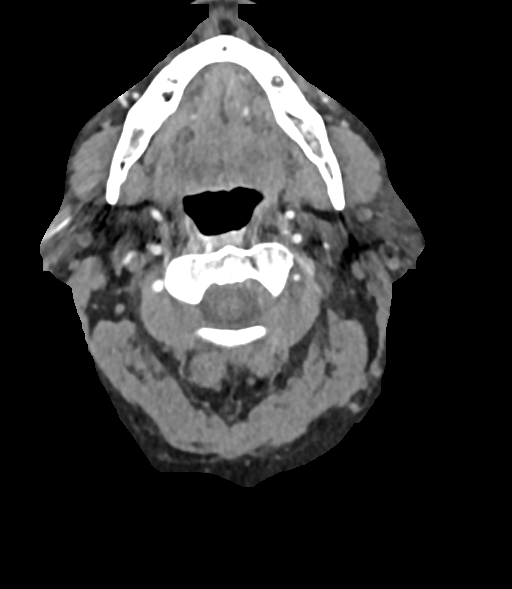
[im 233/350  soft-tissue]
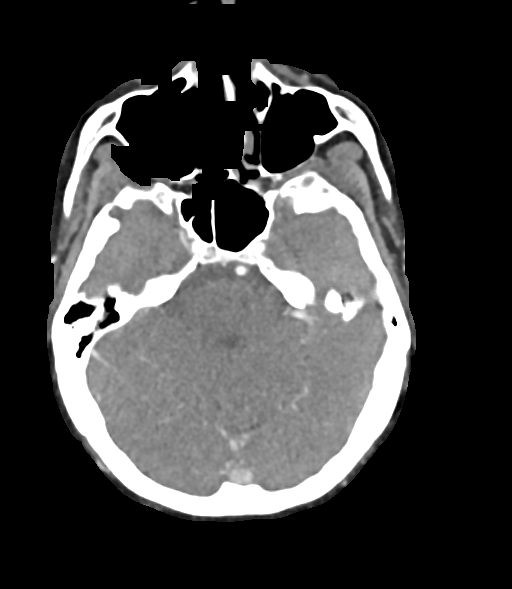
[im 291/350  soft-tissue]
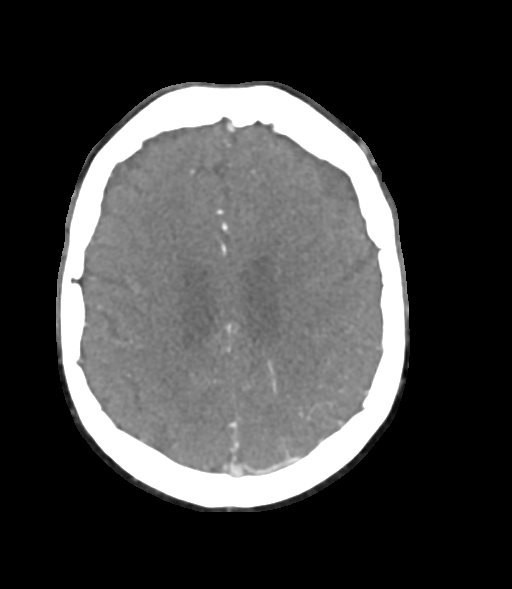

[7 of 35 positions shown; findings below may reference images not displayed]

RADIATION DOSE REDUCTION: This exam was performed according to the
departmental dose-optimization program which includes automated
exposure control, adjustment of the mA and/or kV according to
patient size and/or use of iterative reconstruction technique.

CONTRAST:  100mL OMNIPAQUE IOHEXOL 350 MG/ML SOLN
FINDINGS: CT HEAD FINDINGS

Brain:

Mild generalized cerebral atrophy.

Known evolving sizable acute cortical/subcortical infarct within the
left temporal, parietal and occipital lobes (within the left MCA
vascular territory and left MCA/PCA watershed territory). The
infarct has not significantly progressed in extent as compared to
yesterday's brain MRI. Mild local mass effect without ventricular
effacement or midline shift. No evidence of hemorrhagic conversion.

Chronic lacunar infarcts within the left corona radiata, bilateral
basal ganglia, left thalamus and within the ventral pons, some of
which were better appreciated on the prior brain MRI of [DATE].

Background moderate patchy and ill-defined hypoattenuation within
the cerebral white matter, nonspecific but compatible with chronic
small vessel ischemic disease.

No extra-axial fluid collection.

No evidence of an intracranial mass.

Vascular: No hyperdense vessel.

Skull: Normal. Negative for fracture or focal lesion.

Sinuses: No significant paranasal sinus disease.

Orbits: No orbital mass or acute orbital finding.

Review of the MIP images confirms the above findings

CTA NECK FINDINGS

Aortic arch: The left vertebral artery arises directly from the
aortic arch. Atherosclerotic plaque within the visualized aortic
arch and proximal major branch vessels of the neck. No
hemodynamically significant innominate or proximal subclavian artery
stenosis.

Right carotid system: CCA and ICA patent within the neck without
hemodynamically significant stenosis (50% or greater). Mild
calcified plaque about the carotid bifurcation.

Left carotid system: CCA and ICA patent within the neck without
stenosis. Minimal atherosclerotic plaque about the carotid
bifurcation.

Vertebral arteries: The vertebral arteries are patent within the
neck without stenosis or significant atherosclerotic disease. The
right vertebral artery is dominant.

Skeleton: Straightening of the expected cervical lordosis. Partially
imaged thoracic levocurvature. Cervical spondylosis. No acute bony
abnormality or aggressive osseous lesion.

Other neck: Multiple thyroid nodules measuring up to 14 mm, not
meeting consensus criteria for ultrasound follow-up based on size.

Upper chest: No consolidation within the imaged lung apices.

Review of the MIP images confirms the above findings

CTA HEAD FINDINGS

Anterior circulation:

The intracranial internal carotid arteries are patent. Calcified
plaque within both vessels with no more than mild stenosis. The M1
middle cerebral arteries are patent. An inferior division proximal
M2 left MCA vessel becomes occluded shortly beyond its origin
(series 14, image 20) (series 16, image 28). Atherosclerotic
irregularity of the M2 and more distal MCA vessels, bilaterally.
Most notably, there is a severe focal stenosis within a superior
division mid M2 right MCA vessel (series 16, image 12), a severe
stenosis within a distal M2 right MCA vessel (series 16, image 12)
and a site of severe stenosis within a superior division proximal to
mid M2 left MCA vessel (series 16, image 28). The anterior cerebral
arteries are patent. No intracranial aneurysm is identified.

Posterior circulation:

The intracranial vertebral arteries are patent. The non dominant
left vertebral artery is developmentally diminutive beyond the
origin of the left PICA. The basilar artery is patent. The posterior
cerebral arteries are patent. Atherosclerotic irregularity of both
vessels. Most notably, there is a severe stenosis within the P2 left
PCA (series 14, image 20) and a moderate stenosis within the P2
right PCA. Probable small right posterior communicating artery. The
left posterior communicating artery is diminutive or absent.

Venous sinuses: Within the limitations of contrast timing, no
convincing thrombus.

Anatomic variants: As described.

Review of the MIP images confirms the above findings

CTA head impression #1 will be called to the ordering clinician or
representative by the Radiologist Assistant, and communication
documented in the PACS or [REDACTED].
IMPRESSION: CT head:

1. Known sizable evolving acute cortical/subcortical infarct within
the left temporal, parietal and occipital lobes (left MCA territory
and left MCA/PCA watershed territory). The infarct has not
significantly progressed in extent from yesterday's brain MRI. Mild
local mass effect without ventricular effacement or midline shift.
No evidence of hemorrhagic conversion.
2. Stable background chronic small vessel ischemic disease with
multiple chronic lacunar infarcts, as described.
3. Mild generalized parenchymal atrophy.

CTA neck:

1. The common carotid, internal carotid and vertebral arteries are
patent within the neck without hemodynamically significant stenosis.
Mild atherosclerotic disease within the bilateral carotid systems
within the neck, as described.
2.  Aortic Atherosclerosis ([7T]-[7T]).
3. Cervical spondylosis.

CTA head:

1. An inferior division proximal M2 left MCA vessel becomes occluded
shortly beyond its origin.
2. Intracranial atherosclerotic disease with multifocal stenoses,
most notably as follows.
3. Severe focal stenosis within a superior division mid M2 right MCA
vessel.
4. Severe focal stenosis within a distal M2 right MCA vessel.
5. Severe stenosis within a superior division proximal to mid M2
left MCA vessel.
6. Severe stenosis within the P2 left PCA.
7. Moderate stenosis within the P2 right PCA.

## 2021-12-04 MED ORDER — POTASSIUM CHLORIDE 10 MEQ/100ML IV SOLN
INTRAVENOUS | Status: AC
  Administered 2021-12-04: 10 meq via INTRAVENOUS
  Filled 2021-12-04: qty 200

## 2021-12-04 MED ORDER — POTASSIUM CHLORIDE 10 MEQ/100ML IV SOLN
10.0000 meq | INTRAVENOUS | Status: AC
  Administered 2021-12-04 (×3): 10 meq via INTRAVENOUS
  Filled 2021-12-04 (×2): qty 100

## 2021-12-04 MED ORDER — ASPIRIN EC 81 MG PO TBEC
81.0000 mg | DELAYED_RELEASE_TABLET | Freq: Every day | ORAL | Status: DC
  Administered 2021-12-05 – 2021-12-06 (×2): 81 mg via ORAL
  Filled 2021-12-04 (×2): qty 1

## 2021-12-04 MED ORDER — INSULIN ASPART 100 UNIT/ML IJ SOLN: 0.0000 [IU] | Freq: Every day | INTRAMUSCULAR | Status: DC

## 2021-12-04 MED ORDER — INSULIN ASPART 100 UNIT/ML IJ SOLN
0.0000 [IU] | Freq: Three times a day (TID) | INTRAMUSCULAR | Status: DC
  Administered 2021-12-04 – 2021-12-05 (×4): 1 [IU] via SUBCUTANEOUS
  Administered 2021-12-06: 2 [IU] via SUBCUTANEOUS

## 2021-12-04 MED ORDER — CLOPIDOGREL BISULFATE 75 MG PO TABS
75.0000 mg | ORAL_TABLET | Freq: Every day | ORAL | Status: DC
  Administered 2021-12-04 – 2021-12-06 (×3): 75 mg via ORAL
  Filled 2021-12-04 (×3): qty 1

## 2021-12-04 MED ORDER — EZETIMIBE 10 MG PO TABS
10.0000 mg | ORAL_TABLET | Freq: Every day | ORAL | Status: DC
  Administered 2021-12-04 – 2021-12-06 (×3): 10 mg via ORAL
  Filled 2021-12-04 (×3): qty 1

## 2021-12-04 MED ORDER — IOHEXOL 350 MG/ML SOLN
100.0000 mL | Freq: Once | INTRAVENOUS | Status: AC | PRN
  Administered 2021-12-04: 100 mL via INTRAVENOUS

## 2021-12-04 MED ORDER — NIACIN 100 MG PO TABS
100.0000 mg | ORAL_TABLET | Freq: Every day | ORAL | Status: DC
  Administered 2021-12-04 – 2021-12-05 (×2): 100 mg via ORAL
  Filled 2021-12-04 (×3): qty 1

## 2021-12-04 NOTE — TOC Progression Note (Signed)
Transition of Care (TOC) - Progression Note  ? ? ?Patient Details  ?Name: Rebecca Barnes ?MRN: [id number] ?Date of Birth: July 27, 1946 ? ?Transition of Care (TOC) CM/SW Contact  ?Leone Haven, RN ?Phone Number: ?12/04/2021, 9:31 PM ? ?Clinical Narrative:    ?from home, CVA, expressive aphasia, pt eval rec CIR.   TOC will continue to follow for dc needs. ? ? ?  ?  ? ?Expected Discharge Plan and Services ?  ?  ?  ?  ?  ?                ?  ?  ?  ?  ?  ?  ?  ?  ?  ?  ? ? ?Social Determinants of Health (SDOH) Interventions ?  ? ?Readmission Risk Interventions ?   ? View : No data to display.  ?  ?  ?  ? ? ?

## 2021-12-04 NOTE — Progress Notes (Addendum)
STROKE TEAM PROGRESS NOTE   INTERVAL HISTORY Rebecca Barnes is a 76 year old female with a PMHx of T2DM, HTN, and previous CVA with resulting language difficulty. She presented for worsening aphasia since 12/03/2018 3 PM., exam with no focal sensory or motor deficit.  Initial CT on 12/02/2021 was normal and did not show an acute stroke.  MRI revealed a moderate-sized acute L MCA territory stroke and scattered remote infarcts in the basal ganglia, thalamus, and pons.  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  The patient is seen in her room this morning with her daughter at bedside. The patient's comprehension of simple commands is intact. She is unable to answer orientation questions and produces word salad. She is able to name a pen when she sees it but has difficulty with other objects. When she is asked to name a pair of glasses, she says "you put it for the heart". When asked to read a pamphlet aloud the patient produces nonsensical speech. Daughter reports patient is functionally impaired at baseline but is able to make a simple breakfast and walk short distances with a walker.   Vitals:   12/04/21 0700 12/04/21 0757 12/04/21 0800 12/04/21 1148  BP:  (!) 144/73 (!) 144/73 (!) 131/57  Pulse: (!) 107 100 (!) 105 100  Resp: 16 17 16 16   Temp:  (!) 97.4 F (36.3 C)  98.5 F (36.9 C)  TempSrc:  Oral  Oral  SpO2: 94% 95% 96% 93%  Weight:      Height:       CBC:  Recent Labs  Lab 12/02/21 1103 12/02/21 1904 12/02/21 2124 12/04/21 0137  WBC 6.4 9.0  --  9.2  NEUTROABS 3,808 5.2  --   --   HGB 13.9 13.9 12.6 14.2  HCT 43.4 42.5 37.0 42.6  MCV 83.0 80.8  --  81.3  PLT 392 372  --  388   Basic Metabolic Panel:  Recent Labs  Lab 12/02/21 1904 12/02/21 2124 12/04/21 0137  NA 134* 138 134*  K 3.6 3.5 3.1*  CL 99  --  100  CO2 24  --  24  GLUCOSE 137*  --  122*  BUN 15  --  11  CREATININE 0.57  --   0.51  CALCIUM 10.3  --  9.2   Lipid Panel:  Recent Labs  Lab 12/04/21 0137  CHOL 222*  TRIG 163*  HDL 67  CHOLHDL 3.3  VLDL 33  LDLCALC 132*   HgbA1c:  Recent Labs  Lab 12/04/21 0137  HGBA1C 6.3*   Urine Drug Screen:  Recent Labs  Lab 12/02/21 2118  LABOPIA NONE DETECTED  COCAINSCRNUR NONE DETECTED  LABBENZ NONE DETECTED  AMPHETMU NONE DETECTED  THCU NONE DETECTED  LABBARB NONE DETECTED    Alcohol Level  Recent Labs  Lab 12/02/21 2118  ETH <10    IMAGING past 24 hours CT ANGIO HEAD W OR WO CONTRAST  Result Date: 12/04/2021 CLINICAL DATA:  Stroke, follow-up. EXAM: CT ANGIOGRAPHY HEAD AND NECK TECHNIQUE: Multidetector CT imaging of the head and neck was performed using the standard protocol during bolus administration of intravenous contrast. Multiplanar CT image reconstructions and MIPs were obtained to evaluate the vascular anatomy. Carotid stenosis measurements (when applicable) are obtained utilizing NASCET criteria, using the distal internal carotid diameter as the denominator. RADIATION DOSE REDUCTION: This exam was performed according to the departmental dose-optimization program which includes  automated exposure control, adjustment of the mA and/or kV according to patient size and/or use of iterative reconstruction technique. CONTRAST:  OMNIPAQUE IOHEXOL 350 MG/ML SOLN COMPARISON:  Brain MRI 12/03/2021. Prior head CT examinations 12/02/2021 and earlier. FINDINGS: CT HEAD FINDINGS Brain: Mild generalized cerebral atrophy. Known evolving sizable acute cortical/subcortical infarct within the left temporal, parietal and occipital lobes (within the left MCA vascular territory and left MCA/PCA watershed territory). The infarct has not significantly progressed in extent as compared to yesterday's brain MRI. Mild local mass effect without ventricular effacement or midline shift. No evidence of hemorrhagic conversion. Chronic lacunar infarcts within the left corona  radiata, bilateral basal ganglia, left thalamus and within the ventral pons, some of which were better appreciated on the prior brain MRI of 12/03/2021. Background moderate patchy and ill-defined hypoattenuation within the cerebral white matter, nonspecific but compatible with chronic small vessel ischemic disease. No extra-axial fluid collection. No evidence of an intracranial mass. Vascular: No hyperdense vessel. Skull: Normal. Negative for fracture or focal lesion. Sinuses: No significant paranasal sinus disease. Orbits: No orbital mass or acute orbital finding. Review of the MIP images confirms the above findings CTA NECK FINDINGS Aortic arch: The left vertebral artery arises directly from the aortic arch. Atherosclerotic plaque within the visualized aortic arch and proximal major branch vessels of the neck. No hemodynamically significant innominate or proximal subclavian artery stenosis. Right carotid system: CCA and ICA patent within the neck without hemodynamically significant stenosis (50% or greater). Mild calcified plaque about the carotid bifurcation. Left carotid system: CCA and ICA patent within the neck without stenosis. Minimal atherosclerotic plaque about the carotid bifurcation. Vertebral arteries: The vertebral arteries are patent within the neck without stenosis or significant atherosclerotic disease. The right vertebral artery is dominant. Skeleton: Straightening of the expected cervical lordosis. Partially imaged thoracic levocurvature. Cervical spondylosis. No acute bony abnormality or aggressive osseous lesion. Other neck: Multiple thyroid nodules measuring up to 14 mm, not meeting consensus criteria for ultrasound follow-up based on size. Upper chest: No consolidation within the imaged lung apices. Review of the MIP images confirms the above findings CTA HEAD FINDINGS Anterior circulation: The intracranial internal carotid arteries are patent. Calcified plaque within both vessels with no more  than mild stenosis. The M1 middle cerebral arteries are patent. An inferior division proximal M2 left MCA vessel becomes occluded shortly beyond its origin (series 14, image 20) (series 16, image 28). Atherosclerotic irregularity of the M2 and more distal MCA vessels, bilaterally. Most notably, there is a severe focal stenosis within a superior division mid M2 right MCA vessel (series 16, image 12), a severe stenosis within a distal M2 right MCA vessel (series 16, image 12) and a site of severe stenosis within a superior division proximal to mid M2 left MCA vessel (series 16, image 28). The anterior cerebral arteries are patent. No intracranial aneurysm is identified. Posterior circulation: The intracranial vertebral arteries are patent. The non dominant left vertebral artery is developmentally diminutive beyond the origin of the left PICA. The basilar artery is patent. The posterior cerebral arteries are patent. Atherosclerotic irregularity of both vessels. Most notably, there is a severe stenosis within the P2 left PCA (series 14, image 20) and a moderate stenosis within the P2 right PCA. Probable small right posterior communicating artery. The left posterior communicating artery is diminutive or absent. Venous sinuses: Within the limitations of contrast timing, no convincing thrombus. Anatomic variants: As described. Review of the MIP images confirms the above findings CTA head impression #  1 will be called to the ordering clinician or representative by the Radiologist Assistant, and communication documented in the PACS or [employer]. IMPRESSION: CT head: 1. Known sizable evolving acute cortical/subcortical infarct within the left temporal, parietal and occipital lobes (left MCA territory and left MCA/PCA watershed territory). The infarct has not significantly progressed in extent from yesterday's brain MRI. Mild local mass effect without ventricular effacement or midline shift. No evidence of hemorrhagic  conversion. 2. Stable background chronic small vessel ischemic disease with multiple chronic lacunar infarcts, as described. 3. Mild generalized parenchymal atrophy. CTA neck: 1. The common carotid, internal carotid and vertebral arteries are patent within the neck without hemodynamically significant stenosis. Mild atherosclerotic disease within the bilateral carotid systems within the neck, as described. 2.  Aortic Atherosclerosis (ICD10-I70.0). 3. Cervical spondylosis. CTA head: 1. An inferior division proximal M2 left MCA vessel becomes occluded shortly beyond its origin. 2. Intracranial atherosclerotic disease with multifocal stenoses, most notably as follows. 3. Severe focal stenosis within a superior division mid M2 right MCA vessel. 4. Severe focal stenosis within a distal M2 right MCA vessel. 5. Severe stenosis within a superior division proximal to mid M2 left MCA vessel. 6. Severe stenosis within the P2 left PCA. 7. Moderate stenosis within the P2 right PCA. Electronically Signed   By: Jackey Loge D.O.   On: 12/04/2021 11:38   CT ANGIO NECK W OR WO CONTRAST  Result Date: 12/04/2021 CLINICAL DATA:  Stroke, follow-up. EXAM: CT ANGIOGRAPHY HEAD AND NECK TECHNIQUE: Multidetector CT imaging of the head and neck was performed using the standard protocol during bolus administration of intravenous contrast. Multiplanar CT image reconstructions and MIPs were obtained to evaluate the vascular anatomy. Carotid stenosis measurements (when applicable) are obtained utilizing NASCET criteria, using the distal internal carotid diameter as the denominator. RADIATION DOSE REDUCTION: This exam was performed according to the departmental dose-optimization program which includes automated exposure control, adjustment of the mA and/or kV according to patient size and/or use of iterative reconstruction technique. CONTRAST:  OMNIPAQUE IOHEXOL 350 MG/ML SOLN COMPARISON:  Brain MRI 12/03/2021. Prior head CT examinations  12/02/2021 and earlier. FINDINGS: CT HEAD FINDINGS Brain: Mild generalized cerebral atrophy. Known evolving sizable acute cortical/subcortical infarct within the left temporal, parietal and occipital lobes (within the left MCA vascular territory and left MCA/PCA watershed territory). The infarct has not significantly progressed in extent as compared to yesterday's brain MRI. Mild local mass effect without ventricular effacement or midline shift. No evidence of hemorrhagic conversion. Chronic lacunar infarcts within the left corona radiata, bilateral basal ganglia, left thalamus and within the ventral pons, some of which were better appreciated on the prior brain MRI of 12/03/2021. Background moderate patchy and ill-defined hypoattenuation within the cerebral white matter, nonspecific but compatible with chronic small vessel ischemic disease. No extra-axial fluid collection. No evidence of an intracranial mass. Vascular: No hyperdense vessel. Skull: Normal. Negative for fracture or focal lesion. Sinuses: No significant paranasal sinus disease. Orbits: No orbital mass or acute orbital finding. Review of the MIP images confirms the above findings CTA NECK FINDINGS Aortic arch: The left vertebral artery arises directly from the aortic arch. Atherosclerotic plaque within the visualized aortic arch and proximal major branch vessels of the neck. No hemodynamically significant innominate or proximal subclavian artery stenosis. Right carotid system: CCA and ICA patent within the neck without hemodynamically significant stenosis (50% or greater). Mild calcified plaque about the carotid bifurcation. Left carotid system: CCA and ICA patent within the neck without stenosis.  Minimal atherosclerotic plaque about the carotid bifurcation. Vertebral arteries: The vertebral arteries are patent within the neck without stenosis or significant atherosclerotic disease. The right vertebral artery is dominant. Skeleton: Straightening of the  expected cervical lordosis. Partially imaged thoracic levocurvature. Cervical spondylosis. No acute bony abnormality or aggressive osseous lesion. Other neck: Multiple thyroid nodules measuring up to 14 mm, not meeting consensus criteria for ultrasound follow-up based on size. Upper chest: No consolidation within the imaged lung apices. Review of the MIP images confirms the above findings CTA HEAD FINDINGS Anterior circulation: The intracranial internal carotid arteries are patent. Calcified plaque within both vessels with no more than mild stenosis. The M1 middle cerebral arteries are patent. An inferior division proximal M2 left MCA vessel becomes occluded shortly beyond its origin (series 14, image 20) (series 16, image 28). Atherosclerotic irregularity of the M2 and more distal MCA vessels, bilaterally. Most notably, there is a severe focal stenosis within a superior division mid M2 right MCA vessel (series 16, image 12), a severe stenosis within a distal M2 right MCA vessel (series 16, image 12) and a site of severe stenosis within a superior division proximal to mid M2 left MCA vessel (series 16, image 28). The anterior cerebral arteries are patent. No intracranial aneurysm is identified. Posterior circulation: The intracranial vertebral arteries are patent. The non dominant left vertebral artery is developmentally diminutive beyond the origin of the left PICA. The basilar artery is patent. The posterior cerebral arteries are patent. Atherosclerotic irregularity of both vessels. Most notably, there is a severe stenosis within the P2 left PCA (series 14, image 20) and a moderate stenosis within the P2 right PCA. Probable small right posterior communicating artery. The left posterior communicating artery is diminutive or absent. Venous sinuses: Within the limitations of contrast timing, no convincing thrombus. Anatomic variants: As described. Review of the MIP images confirms the above findings CTA head  impression #1 will be called to the ordering clinician or representative by the Radiologist Assistant, and communication documented in the PACS or [employer]. IMPRESSION: CT head: 1. Known sizable evolving acute cortical/subcortical infarct within the left temporal, parietal and occipital lobes (left MCA territory and left MCA/PCA watershed territory). The infarct has not significantly progressed in extent from yesterday's brain MRI. Mild local mass effect without ventricular effacement or midline shift. No evidence of hemorrhagic conversion. 2. Stable background chronic small vessel ischemic disease with multiple chronic lacunar infarcts, as described. 3. Mild generalized parenchymal atrophy. CTA neck: 1. The common carotid, internal carotid and vertebral arteries are patent within the neck without hemodynamically significant stenosis. Mild atherosclerotic disease within the bilateral carotid systems within the neck, as described. 2.  Aortic Atherosclerosis (ICD10-I70.0). 3. Cervical spondylosis. CTA head: 1. An inferior division proximal M2 left MCA vessel becomes occluded shortly beyond its origin. 2. Intracranial atherosclerotic disease with multifocal stenoses, most notably as follows. 3. Severe focal stenosis within a superior division mid M2 right MCA vessel. 4. Severe focal stenosis within a distal M2 right MCA vessel. 5. Severe stenosis within a superior division proximal to mid M2 left MCA vessel. 6. Severe stenosis within the P2 left PCA. 7. Moderate stenosis within the P2 right PCA. Electronically Signed   By: Jackey Loge D.O.   On: 12/04/2021 11:38   MR BRAIN WO CONTRAST  Result Date: 12/03/2021 CLINICAL DATA:  Follow-up examination for acute stroke. EXAM: MRI HEAD WITHOUT CONTRAST TECHNIQUE: Multiplanar, multiecho pulse sequences of the brain and surrounding structures were obtained without intravenous contrast. COMPARISON:  Prior CT from 12/02/2021. FINDINGS: Brain: Diffuse prominence of  the CSF containing spaces compatible generalized age-related cerebral atrophy. Patchy T2/FLAIR hyperintensity involving the periventricular deep white matter both cerebral hemispheres most consistent with chronic small vessel ischemic disease, moderately advanced in nature. Multiple scattered remote lacunar infarcts present about the bilateral basal ganglia/corona radiata, left thalamus, and left ventral pons. Patchy and confluent restricted diffusion involving the left temporal occipital region, with extension to involve the underlying left periatrial white matter, consistent with acute left MCA distribution infarct. Mild patchy involvement of the left insula noted. No associated hemorrhage or significant regional mass effect. No other evidence for acute or subacute ischemia. Gray-white matter differentiation otherwise maintained. No other acute or chronic intracranial blood products. No mass lesion, midline shift or mass effect. No hydrocephalus or extra-axial fluid collection. Pituitary gland suprasellar region within normal limits. Midline structures intact. Vascular: Major intracranial vascular flow voids are maintained. Skull and upper cervical spine: Craniocervical junction within normal limits. Bone marrow signal intensity normal. No scalp soft tissue abnormality. Sinuses/Orbits: Globes and orbital soft tissues within normal limits. Paranasal sinuses are largely clear. No significant mastoid effusion. Inner ear structures grossly normal. Other: None. IMPRESSION: 1. Moderate-sized acute left MCA distribution infarct involving the left temporal occipital region as above. No associated hemorrhage or significant regional mass effect. 2. Underlying age-related cerebral atrophy with moderate chronic microvascular ischemic disease, with multiple scattered remote lacunar infarcts involving the bilateral basal ganglia/corona radiata, left thalamus, and left ventral pons. Electronically Signed   By: Rise Mu M.D.   On: 12/03/2021 20:27   ECHOCARDIOGRAM COMPLETE  Result Date: 12/04/2021    ECHOCARDIOGRAM REPORT   Patient Name:   ADLYNN Belter Date of Exam: 12/04/2021 Medical Rec #:  [id number]      Height:       65.0 in Accession #:    [id number]     Weight:       227.5 lb Date of Birth:  20-Jun-1946      BSA:          2.090 m Patient Age:    75 years       BP:           138/73 mmHg Patient Gender: F              HR:           100 bpm. Exam Location:  Inpatient Procedure: 2D Echo Indications:    Stroke  History:        Patient has no prior history of Echocardiogram examinations.                 Stroke; Risk Factors:Diabetes and Hypertension.  Sonographer:    Devonne Doughty Referring Phys: [phone] MCNEILL P KIRKPATRICK  Sonographer Comments: Image acquisition challenging due to patient body habitus and supine. IMPRESSIONS  1. Left ventricular ejection fraction, by estimation, is 70 to 75%. The left ventricle has hyperdynamic function. The left ventricle has no regional wall motion abnormalities. Left ventricular diastolic function could not be evaluated.  2. Right ventricular systolic function was not well visualized. The right ventricular size is not well visualized. There is normal pulmonary artery systolic pressure.  3. The mitral valve is normal in structure. No evidence of mitral valve regurgitation. No evidence of mitral stenosis.  4. The aortic valve is normal in structure. Aortic valve regurgitation is not visualized. No aortic stenosis is present. FINDINGS  Left Ventricle: Left ventricular ejection fraction, by estimation, is 70  to 75%. The left ventricle has hyperdynamic function. The left ventricle has no regional wall motion abnormalities. The left ventricular internal cavity size was normal in size. There is no left ventricular hypertrophy. Left ventricular diastolic function could not be evaluated. Right Ventricle: The right ventricular size is not well visualized. Right vetricular wall thickness  was not well visualized. Right ventricular systolic function was not well visualized. There is normal pulmonary artery systolic pressure. The tricuspid regurgitant velocity is 1.86 m/s, and with an assumed right atrial pressure of 3 mmHg, the estimated right ventricular systolic pressure is 16.8 mmHg. Left Atrium: Left atrial size was not well visualized. Right Atrium: Right atrial size was not well visualized. Pericardium: There is no evidence of pericardial effusion. Mitral Valve: The mitral valve is normal in structure. No evidence of mitral valve regurgitation. No evidence of mitral valve stenosis. Tricuspid Valve: The tricuspid valve is normal in structure. Tricuspid valve regurgitation is trivial. Aortic Valve: The aortic valve is normal in structure. Aortic valve regurgitation is not visualized. No aortic stenosis is present. Aortic valve mean gradient measures 4.0 mmHg. Aortic valve peak gradient measures 7.6 mmHg. Aortic valve area, by VTI measures 2.54 cm. Pulmonic Valve: The pulmonic valve was not well visualized. Pulmonic valve regurgitation is not visualized. Aorta: The aortic root was not well visualized. IAS/Shunts: The interatrial septum was not well visualized.  LEFT VENTRICLE PLAX 2D LVIDd:         4.50 cm LVIDs:         3.00 cm LV PW:         0.90 cm LV IVS:        0.90 cm LVOT diam:     2.00 cm LV SV:         57 LV SV Index:   27 LVOT Area:     3.14 cm  LEFT ATRIUM           Index LA diam:      2.40 cm 1.15 cm/m LA Vol (A4C): 50.0 ml 23.93 ml/m  AORTIC VALVE AV Area (Vmax):    2.62 cm AV Area (Vmean):   2.53 cm AV Area (VTI):     2.54 cm AV Vmax:           138.00 cm/s AV Vmean:          93.100 cm/s AV VTI:            0.223 m AV Peak Grad:      7.6 mmHg AV Mean Grad:      4.0 mmHg LVOT Vmax:         115.00 cm/s LVOT Vmean:        74.900 cm/s LVOT VTI:          0.180 m LVOT/AV VTI ratio: 0.81 MITRAL VALVE                TRICUSPID VALVE MV Area (PHT): 4.89 cm     TR Peak grad:   13.8 mmHg MV  Decel Time: 155 msec     TR Vmax:        186.00 cm/s MV E velocity: 148.00 cm/s                             SHUNTS                             Systemic VTI:  0.18 m                             Systemic Diam: 2.00 cm Kristeen Miss MD Electronically signed by Kristeen Miss MD Signature Date/Time: 12/04/2021/12:01:26 PM    Final     PHYSICAL EXAM  Physical Exam  Constitutional: Appears well-developed and well-nourished.  Pleasant elderly Caucasian lady Psych: Affect appropriate to situation Eyes: No scleral injection HENT: No OP obstrucion MSK: no joint deformities.  Cardiovascular: Normal rate and regular rhythm.  Respiratory: Effort normal, non-labored breathing GI: Soft.  No distension. There is no tenderness.  Skin: WDI  Neuro: Mental Status: Patient is awake, alert, orientation difficult to determine 2/2 speech difficulty Significant receptive aphasia with jargon speech and impaired comprehension.  Naming and repetition are impaired.  At times her speech appears to be clear at x6 words jumbled individual.  She is unable to read  cranial Nerves: II: Visual Fields are full. Pupils are equal, round, and reactive to light.   III,IV, VI: EOMI without ptosis or diploplia.  V: Facial sensation is symmetric to light touch VII: Facial movement is symmetric resting and smiling VIII: Hearing is intact to voice X: Palate elevates symmetrically XI: Shoulder shrug is symmetric. XII: Tongue protrudes midline without atrophy or fasciculations.  Motor: Tone is normal. Bulk is normal. 5/5 strength was present in all four extremities.  Sensory: Sensation is symmetric to light touch in the arms and legs.  Cerebellar: FNF intact bilaterally  NIHSS 6 Premorbid MRS 2 ASSESSMENT/PLAN Rebecca Barnes is a 76 year old female with a PMHx of T2DM, HTN, and previous CVA with resulting language difficulty. She presented for worsening aphasia for several days, exam with no focal sensory or motor deficit. MRI  revealed a moderate-sized acute L MCA territory stroke and scattered remote infarcts in the basal ganglia, thalamus, and pons.   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  CTA head & neck: patent carotid and vertebral arteries, occlusion of L M2, severe stenosis of right M2 and left P2. MRI:  moderate-sized acute L MCA territory stroke and scattered remote infarcts in the basal ganglia, thalamus, and pons 2D Echo unremarkable, no thrombus, interatrial septum not well visualized LDL 122 HgbA1c 6.3 VTE prophylaxis - Lovenox 40 mg    Diet   Diet Carb Modified Fluid consistency: Thin; Room service appropriate? Yes   aspirin 81 mg daily prior to admission, now on aspirin 81 mg daily and clopidogrel 75 mg daily. Will continue for three weeks, then take Plavix alone indefinitely.  Patient considering enrollment in OCEANIC trial Will need a possible TEE and loop recorder Therapy recommendations:  pending Disposition:  TBD  Hypertension Home meds: amlodipine, losartan Stable Permissive hypertension (OK if < 220/120) but gradually normalize in 5-7 days Long-term BP goal normotensive  Hyperlipidemia Home meds:  NA , reported intolerance to statins LDL 122, goal < 70 Add Zetia 10 mg   Patient will need consideration of PCSK9i on an outpatient basis  Glucose control Home meds:  metformin HgbA1c 6.3, goal < 7.0 CBGs Recent Labs    12/03/21 2129 12/04/21 0803 12/04/21 1152  GLUCAP 147* 140* 144*    SSI  Other Stroke Risk Factors Advanced Age >/= 65  ETOH use, alcohol level <10,  advised to drink no more than 1 drink(s) a day Obesity, Body mass index is 37.86 kg/m., BMI >/= 30 associated with increased stroke risk, recommend weight loss, diet and exercise as  appropriate  Hx stroke  Hospital day # 1   Carlyn Reichert, MD PGY-1  STROKE MD NOTE : I have personally obtained history,examined this patient, reviewed notes, independently viewed imaging studies, participated in medical decision making and plan of care.ROS completed by me personally and pertinent positives fully documented  I have made any additions or clarifications directly to the above note. Agree with note above.  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  She also has diffuse intracranial atherosclerosis.  Recommend aspirin and Plavix for 3 weeks followed by aspirin alone.  Continue ongoing stroke work-up.  Continue speech therapy and mobilize out of bed as tolerated.  Patient also benefit with possible consideration for possible participation in the [facility] stroke prevention study (aspirin and Plavix and Asundexian versus aspirin and Plavix and placebo).  I have given her and daughter information to review and decide.  Greater than 50% time during this 50-minute visit was spent in counseling and coordination of care about her stroke and discussion.  Prevention and treatment answering questions.  Discussed with Dr. Lauraine Rinne, MD Medical Director [facility] Pager: [phone] 12/04/2021 3:34 PM   To contact Stroke Continuity provider, please refer to [email or web address]. After hours, contact General Neurology

## 2021-12-04 NOTE — Progress Notes (Signed)
PT Cancellation Note ? ?Patient Details ?Name: Rebecca Barnes ?MRN: [id number] ?DOB: Mar 16, 1946 ? ? ?Cancelled Treatment:    Reason Eval/Treat Not Completed: Other (comment).  Gone to MRI and will retry as time and pt allow. ? ? ?Ivar Drape ?12/04/2021, 10:27 AM ? ?Samul Dada, PT PhD ?Acute Rehab Dept. Number: [facility] [phone] and [facility] [phone] ? ?

## 2021-12-04 NOTE — Progress Notes (Signed)
?                                  PROGRESS NOTE                                             ?                                                                                                                     ?                                         ? ? Patient Demographics:  ? ? Rebecca Barnes, is a 76 y.o. female, DOB - April 02, 1946, DJM:[id number] ? ?Outpatient Primary MD for the patient is [facility]    LOS - 1  Admit date - 12/02/2021   ? ?Chief Complaint  ?Patient presents with  ? Altered Mental Status  ?    ? ?Brief Narrative (HPI from H&P)   76 year old Caucasian female who lives at home with previous known medical history of diabetes mellitus type 2, hypertension, previous history of stroke who presented with 48-hour duration history of difficulty to speak.  Work-up consistent with MRI with large left MCA territory ischemic infarct and she was admitted for further work-up ? ? Subjective:  ? ? Rebecca Barnes today has, No headache, No chest pain, No abdominal pain unreliable historian due to expressive aphasia ? ? Assessment  & Plan :  ? ? 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  She will be started on Zetia and niacin combination due to statin allergy, may as well may require dual antiplatelet therapy but will defer this to the stroke team.  Full stroke work-up underway. ? ?2.  Dyslipidemia.  Zetia and niacin combination.  Stated statin allergy. ? ?3.  Obesity with BMI of 37.  Follow with PCP for weight loss. ? ?4.  Hypokalemia.  Replaced. ? ?5.  Depression.  Home medications. ? ?6.  Hypertension.  Allow for permissive hypertension Home antihypertensives on hold. ? ?7.  DM type II.  Borderline A1c.  Sliding scale for now.  Glucophage on hold. ? ?Lab Results  ?Component Value Date  ? HGBA1C 6.3 (H) 12/04/2021  ? ?Lab Results   ?Component Value Date  ? CHOL 222 (H) 12/04/2021  ? HDL 67 12/04/2021  ? LDLCALC 122 (H) 12/04/2021  ? TRIG 163 (H) 12/04/2021  ? CHOLHDL 3.3 12/04/2021  ? ? ?CBG (last 3)  ?Recent Labs  ?  12/03/21 ?1724 12/03/21 ?2129 12/04/21 ?0803  ?GLUCAP 131* 147* 140*  ? ? ? ?   ? ?  Condition - Extremely Guarded ? ?Family Communication  : Daughter Rebecca Barnes [phone] message left on 12/04/2021 at 11:10 AM ? ?Code Status :  Full ? ?Consults  :  Neuro ? ?PUD Prophylaxis :  ? ? Procedures  :    ? ?MRI - 1. Moderate-sized acute left MCA distribution infarct involving the left temporal occipital region as above. No associated hemorrhage or significant regional mass effect. 2. Underlying age-related cerebral atrophy with moderate chronic microvascular ischemic disease, with multiple scattered remote lacunar infarcts involving the bilateral basal ganglia/corona radiata, left thalamus, and left ventral pons.  ? ?TTE -  ? ?Carotid -  ? ?   ? ?Disposition Plan  :   ? ?Status is: Inpatient ? ?DVT Prophylaxis  :   ? ?enoxaparin (LOVENOX) injection 40 mg Start: 12/03/21 2200 ? ?Lab Results  ?Component Value Date  ? PLT 388 12/04/2021  ? ? ?Diet :  ?Diet Order   ? ?       ?  Diet regular Room service appropriate? Yes; Fluid consistency: Thin  Diet effective now       ?  ? ?  ?  ? ?  ?  ? ?Inpatient Medications ? ?Scheduled Meds: ? aspirin  300 mg Rectal Daily  ? Or  ? aspirin  325 mg Oral Daily  ? enoxaparin (LOVENOX) injection  40 mg Subcutaneous Q24H  ? ezetimibe  10 mg Oral Daily  ? FLUoxetine  20 mg Oral Daily  ? mouth rinse  15 mL Mouth Rinse BID  ? melatonin  10 mg Oral QHS  ? multivitamin with minerals  1 tablet Oral Daily  ? niacin  100 mg Oral QHS  ? sodium chloride  1 g Oral TID WC  ? ?Continuous Infusions: ?PRN Meds:.acetaminophen **OR** acetaminophen, clonazePAM, ondansetron **OR** ondansetron (ZOFRAN) IV ? ?Antibiotics  :   ? ?Anti-infectives (From admission, onward)  ? ? None  ? ?  ? ? ? Time Spent in minutes  30 ? ? ?Susa RaringPrashant  Glen Kesinger M.D on 12/04/2021 at 11:09 AM ? ?To page go to [email or web address]  ? ?[facility] -  Office  [phone] ? ?See all Orders from today for further details ? ? ? Objective:  ? ?Vitals:  ? 12/04/21 0600 12/04/21 0700 12/04/21 0757 12/04/21 0800  ?BP:   (!) 144/73 (!) 144/73  ?Pulse: 99 (!) 107 100 (!) 105  ?Resp: 14 16 17 16   ?Temp:   (!) 97.4 ?F (36.3 ?C)   ?TempSrc:   Oral   ?SpO2: 97% 94% 95% 96%  ?Weight:      ?Height:      ? ? ?Wt Readings from Last 3 Encounters:  ?12/02/21 103.2 kg  ?12/02/21 103.2 kg  ?05/01/21 98.9 kg  ? ? ? ?Intake/Output Summary (Last 24 hours) at 12/04/2021 1109 ?Last data filed at 12/03/2021 2331 ?Gross per 24 hour  ?Intake --  ?Output 650 ml  ?Net -650 ml  ? ? ? ?Physical Exam ? ?Awake Alert, positive expressive aphasia, no significant weakness on the right side ?Ruby.AT,PERRAL ?Supple Neck, No JVD,   ?Symmetrical Chest wall movement, Good air movement bilaterally, CTAB ?RRR,No Gallops,Rubs or new Murmurs,  ?+ve B.Sounds, Abd Soft, No tenderness,   ?No Cyanosis, Clubbing or edema  ?  ? ? Data Review:  ? ? ?CBC ?Recent Labs  ?Lab 12/02/21 ?1103 12/02/21 ?1904 12/02/21 ?2124 12/04/21 ?[id number]  ?WBC 6.4 9.0  --  9.2  ?HGB 13.9 13.9 12.6 14.2  ?HCT 43.4 42.5 37.0 42.6  ?  PLT 392 372  --  388  ?MCV 83.0 80.8  --  81.3  ?MCH 26.6* 26.4  --  27.1  ?MCHC 32.0 32.7  --  33.3  ?RDW 13.1 13.5  --  13.4  ?LYMPHSABS 1,805 2.7  --   --   ?MONOABS  --  0.9  --   --   ?EOSABS 102 0.2  --   --   ?BASOSABS 32 0.0  --   --   ? ? ?Electrolytes ?Recent Labs  ?Lab 12/02/21 ?1103 12/02/21 ?1904 12/02/21 ?2118 12/02/21 ?2124 12/04/21 ?8657  ?NA 137 134*  --  138 134*  ?K 4.7 3.6  --  3.5 3.1*  ?CL 99 99  --   --  100  ?CO2 29 24  --   --  24  ?GLUCOSE 138 137*  --   --  122*  ?BUN 14 15  --   --  11  ?CREATININE 0.52* 0.57  --   --  0.51  ?CALCIUM 10.2 10.3  --   --  9.2  ?AST  --  14*  --   --   --   ?ALT  --  12  --   --   --   ?ALKPHOS  --  94  --   --   --   ?BILITOT  --  0.4  --   --   --   ?ALBUMIN  --  4.5   --   --   --   ?INR  --  1.0  --   --   --   ?TSH 1.05  --   --   --   --   ?HGBA1C 6.8*  --   --   --  6.3*  ?AMMONIA  --   --  37*  --   --   ? ? ?------------------------------------------------------------------------------------------------------------------ ?Recent Labs  ?  12/02/21 ?1103 12/04/21 ?8469  ?CHOL 227* 222*  ?HDL 77 67  ?LDLCALC 123* 122*  ?TRIG 155* 163*  ?CHOLHDL 2.9 3.3  ? ? ?Lab Results  ?Component Value Date  ? HGBA1C 6.3 (H) 12/04/2021  ? ? ?Recent Labs  ?  12/02/21 ?1103  ?TSH 1.05  ? ?------------------------------------------------------------------------------------------------------------------ ?ID Labs ?Recent Labs  ?Lab 12/02/21 ?1103 12/02/21 ?1904 12/04/21 ?6295  ?WBC 6.4 9.0 9.2  ?PLT 392 372 388  ?CREATININE 0.52* 0.57 0.51  ? ?Cardiac Enzymes ?No results for input(s): CKMB, TROPONINI, MYOGLOBIN in the last 168 hours. ? ?Invalid input(s): CK ? ? ?Radiology Reports ?CT HEAD WO CONTRAST ? ?Result Date: 12/02/2021 ?CLINICAL DATA:  Altered mental status. EXAM: CT HEAD WITHOUT CONTRAST TECHNIQUE: Contiguous axial images were obtained from the base of the skull through the vertex without intravenous contrast. RADIATION DOSE REDUCTION: This exam was performed according to the departmental dose-optimization program which includes automated exposure control, adjustment of the mA and/or kV according to patient size and/or use of iterative reconstruction technique. COMPARISON:  March 14, 2021 FINDINGS: Brain: There is mild to moderate severity cerebral atrophy with widening of the extra-axial spaces and ventricular dilatation. There are areas of decreased attenuation within the white matter tracts of the supratentorial brain, consistent with microvascular disease changes. Vascular: No hyperdense vessel or unexpected calcification. Skull: Normal. Negative for fracture or focal lesion. Sinuses/Orbits: No acute finding. Other: None. IMPRESSION: 1. No acute intracranial abnormality. 2.  Generalized cerebral atrophy and microvascular disease changes of the supratentorial brain. Electronically Signed   By: Aram Candela M.D.   On: 12/02/2021  19:33  ? ?MR BRAIN WO CONTRAST ? ?Result Date: 4/

## 2021-12-04 NOTE — Evaluation (Signed)
Speech Language Pathology Evaluation ?Patient Details ?Name: Rebecca Barnes ?MRN: [id number] ?DOB: March 12, 1946 ?Today's Date: 12/04/2021 ?Time: 9470-9628 ?SLP Time Calculation (min) (ACUTE ONLY): 32 min ? ?Problem List:  ?Patient Active Problem List  ? Diagnosis Date Noted  ? Stroke (HCC) 12/03/2021  ? Expressive aphasia 12/03/2021  ? Obesity (BMI 30-39.9) 12/03/2021  ? History of marijuana use 12/03/2021  ? Stroke-like symptoms 12/02/2021  ? Candidiasis of female genitalia 03/20/2021  ? History of CVA (cerebrovascular accident) 03/20/2021  ? Hyponatremia 03/14/2021  ? Hypokalemia 03/14/2021  ? Thrombocytosis 03/14/2021  ? Polycythemia 03/14/2021  ? Late effect of CVA (RLE weakness) 03/14/2021  ? Essential hypertension 09/18/2019  ? Anxiety with depression 06/07/2019  ? Type 2 diabetes mellitus without complication, without long-term current use of insulin (HCC) 06/07/2019  ? Disturbance of skin sensation 06/07/2019  ? Leukocytosis 06/07/2019  ? Secondary DM with peripheral vascular disease (HCC) 06/07/2019  ? Generalized weakness 06/07/2019  ? ANXIETY 03/29/2007  ? ?Past Medical History:  ?Past Medical History:  ?Diagnosis Date  ? Anxiety   ? Asthma   ? Diabetes mellitus without complication (HCC)   ? Hypertension   ? Stroke [facility])   ? ?Past Surgical History:  ?Past Surgical History:  ?Procedure Laterality Date  ? arm fracture    ? arm surgery  ? CHOLECYSTECTOMY    ? ?HPI:  ?76 y.o. female with medical history significant of previous CVAs, anxiety, T2DM, HTN, Asthma and previous marijuana abuse who was transferred from OSH ED for the evaluation for word salad on 12/01/21.     Patient was unable to provide history, the detailed H&P is obtained by talking to patient's daughter over the phone, and chart review.     Patient has had a history of marijuana abuse up until January 2023 when she moved in with her daughter.  She is currently living with her daughter.  Patient was noted to have increased forgetfulness, and  progressive worsening of word salad since 11/30/2021.  Patient's daughter reports that patient still recognizes her family members and follows simple commands. But her words did not make any sense when patient speaks. CXR showed no acute changes.  Patient was transferred to [facility] for further evaluation; 12/03/21 MRI head indicated Moderate-sized acute left MCA distribution infarct involving the  left temporal occipital region as above. No associated hemorrhage or  significant regional mass effect.  2. Underlying age-related cerebral atrophy with moderate chronic  microvascular ischemic disease, with multiple scattered remote  lacunar infarcts involving the bilateral basal ganglia/corona  radiata, left thalamus, and left ventral pons; SLE/BSE generated  ? ?Assessment / Plan / Recommendation ?Clinical Impression ? Pt seen for speech/language assessment with noted expressive aphasia characterized by word salad, neologisms, perseveration, phonemic/semantic paraphasias and intermittent automatic responses such as "yes", "thank you so much" and "Delicious" noted during clinical swallowing evaluation.  Pt tearful and exhibited min awareness of conversational errors.  She would often attempt to repeat words with min success.  Repetition of words and responsive naming 25% accurate with phrase completion, phonemic/semantic cues provided by SLP.  Receptively, pt able to answer simple yes/no questions accurately and followed 2-step commands, but accuracy decreased to 20% with multi-step functional commands.  Cognition unable to be assessed fully at this time, but decreased sustained attention noted during SLE/BSE completion.  ST will continue to f/u in acute setting for receptive/expressive aphasia and ongoing cognitive assessment.  Thank you for this consult. ?   ?SLP Assessment ? SLP Recommendation/Assessment: Patient needs continued  Speech Language Pathology Services ?SLP Visit Diagnosis: Aphasia (R47.01);Attention and  concentration deficit ?Attention and concentration deficit following: Cerebral infarction  ?  ?Recommendations for follow up therapy are one component of a multi-disciplinary discharge planning process, led by the attending physician.  Recommendations may be updated based on patient status, additional functional criteria and insurance authorization. ?   ?Follow Up Recommendations ? Acute inpatient rehab (3hours/day)  ?  ?Assistance Recommended at Discharge ? Frequent or constant Supervision/Assistance  ?Functional Status Assessment Patient has had a recent decline in their functional status and demonstrates the ability to make significant improvements in function in a reasonable and predictable amount of time.  ?Frequency and Duration min 2x/week  ?1 week ?  ?   ?SLP Evaluation ?Cognition ? Overall Cognitive Status: No family/caregiver present to determine baseline cognitive functioning ?Arousal/Alertness: Awake/alert (tearful/distracted) ?Orientation Level: Oriented to person;Oriented to place;Other (comment) (DTA d/t aphasia) ?Attention: Sustained ?Sustained Attention: Impaired ?Sustained Attention Impairment: Verbal basic;Functional basic ?Memory:  (DTA) ?Awareness: Impaired ?Awareness Impairment: Other (comment) (DTA, but pt tearful when attempting to communicate) ?Behaviors: Impulsive;Confabulation;Perseveration;Verbal agitation  ?  ?   ?Comprehension ? Auditory Comprehension ?Overall Auditory Comprehension: Impaired ?Yes/No Questions:  [facility] for simple yes/no responses) ?Commands: Impaired ?Multistep Basic Commands: 25-49% accurate ?Conversation: Simple ?Other Conversation Comments: expressive aphasia/word salad ?Interfering Components: Attention ?EffectiveTechniques: Repetition;Visual/Gestural cues;Stressing words ?Visual Recognition/Discrimination ?Discrimination: Not tested ?Reading Comprehension ?Reading Status: Not tested  ?  ?Expression Expression ?Primary Mode of Expression: Verbal ?Verbal  Expression ?Overall Verbal Expression: Impaired ?Initiation: Impaired ?Automatic Speech: Name;Social Response ?Level of Generative/Spontaneous Verbalization: Sentence ?Repetition: Impaired ?Level of Impairment: Word level ?Naming: Impairment ?Responsive: 26-50% accurate ?Confrontation: Impaired ?Convergent: 25-49% accurate ?Divergent: Not tested ?Other Naming Comments: phonemic/semantic paraphasias, perseveration, word salad, neologisms ?Verbal Errors: Confabulation;Perseveration;Neologisms;Phonemic paraphasias;Semantic paraphasias (some awareness of errors) ?Pragmatics: Unable to assess ?Non-Verbal Means of Communication: Gestures ?Written Expression ?Dominant Hand: Right ?Written Expression: Not tested   ?Oral / Motor ? Oral Motor/Sensory Function ?Overall Oral Motor/Sensory Function: Within functional limits ?Motor Speech ?Overall Motor Speech: Other (comment) (DTA) ?Respiration: Within functional limits ?Phonation: Normal ?Resonance: Within functional limits ?Articulation: Impaired ?Level of Impairment: Word ?Intelligibility: Unable to assess (comment) ?Motor Planning: Not tested ?Motor Speech Errors: Not applicable   ?        ? ?Tressie Stalker, M.S., CCC-SLP ?12/04/2021, 10:30 AM ? ?

## 2021-12-04 NOTE — Evaluation (Signed)
Clinical/Bedside Swallow Evaluation ?Patient Details  ?Name: Rebecca Barnes ?MRN: [id number] ?Date of Birth: [birth year] ? ?Today's Date: 12/04/2021 ?Time: SLP Start Time (ACUTE ONLY): [id number] SLP Stop Time (ACUTE ONLY): 0950 ?SLP Time Calculation (min) (ACUTE ONLY): 32 min ? ?Past Medical History:  ?Past Medical History:  ?Diagnosis Date  ? Anxiety   ? Asthma   ? Diabetes mellitus without complication (HCC)   ? Hypertension   ? Stroke [facility])   ? ?Past Surgical History:  ?Past Surgical History:  ?Procedure Laterality Date  ? arm fracture    ? arm surgery  ? CHOLECYSTECTOMY    ? ?HPI:  ?76 y.o. female with medical history significant of previous CVAs, anxiety, T2DM, HTN, Asthma and previous marijuana abuse who was transferred from OSH ED for the evaluation for word salad on 12/02/21.   Patient was unable to provide history, the detailed H&P is obtained by talking to patient's daughter over the phone, and chart review. Patient has had a history of marijuana abuse up until January 2023 when she moved in with her daughter.  She is currently living with her daughter.  Patient was noted to have increased forgetfulness, and progressive worsening of word salad since 11/30/2021.  Patient's daughter reports that patient still recognizes her family members and follows simple commands. But her words did not make any sense when patient speaks.  CXR showed no acute changes.  Patient was [facility] for [facility] evaluation; 12/03/21 MRI head indicated Moderate-sized acute left MCA distribution infarct involving the  left temporal occipital region as above. No associated hemorrhage or  significant regional mass effect.  2. Underlying age-related cerebral atrophy with moderate chronic  microvascular ischemic disease, with multiple scattered remote  lacunar infarcts involving the bilateral basal ganglia/corona  radiata, left thalamus, and left ventral pons; SLE/BSE generated  ?  ?Assessment / Plan / Recommendation  ?Clinical  Impression ? Pt seen for clinical swallowing evaluation with various consistencies including thin via varying volumes, puree and solids with adequate timing, good oral/pharyngeal preparation, manipulation, mastication and clearance.  No overt s/s of aspiration noted throughout assessment.  Pt is min confused and exhibits decreased sustained attention/tearful overall, so this may impact meal consumption and place her at a mild risk for aspiration without general swallowing precautions and intermittent supervision with meals provided. Recommend initiating Regular/thin liquids with small sips/bites, slow rate, decrease distractions within room and upright positioning with ST f/u for meal intake to assess meal tolerance in conjunction with inattention/educating caregivers/family re: swallowing safety/precautions.  ST will f/u for aphasia during acute stay as well.  Thank you for this consult. ?SLP Visit Diagnosis: Dysphagia, unspecified (R13.10) ?   ?Aspiration Risk ? Mild aspiration risk  ?  ?Diet Recommendation   Regular/thin liquids ? ?Medication Administration: Whole meds with liquid  ?  ?Other  Recommendations Oral Care Recommendations: Oral care BID   ? ?Recommendations for follow up therapy are one component of a multi-disciplinary discharge planning process, led by the attending physician.  Recommendations may be updated based on patient status, additional functional criteria and insurance authorization. ? ?Follow up Recommendations Acute inpatient rehab (3hours/day)  ? ? ?  ?Assistance Recommended at Discharge Frequent or constant Supervision/Assistance  ?Functional Status Assessment Patient has had a recent decline in their functional status and demonstrates the ability to make significant improvements in function in a reasonable and predictable amount of time.  ?Frequency and Duration min 2x/week  ?1 week ?  ?   ? ?Prognosis Prognosis for Safe  Diet Advancement: Good ?Barriers to Reach Goals: Cognitive  deficits  ? ?  ? ?Swallow Study   ?General Date of Onset: 12/02/21 ?HPI: 76 y.o. female with medical history significant of previous CVAs, anxiety, T2DM, HTN, Asthma and previous marijuana abuse who was transferred from OSH ED for the evaluation for word salad.     Patient is unable to provide history, the detailed H&P is obtained by talking to patient's daughter over the phone, and chart review.     Patient has had a history of marijuana abuse up until January 2023 when she moved in with her daughter.  She is currently living with her daughter.  Patient was noted to have increased forgetfulness, and progressive worsening of word salad since 11/30/2021.  Patient's daughter reports that patient still recognizes her family members and follows simple commands. But her words did not make any sense when patient speaks.  Patient could not give details on review of system.  But daughter did not notice any other positive symptoms.  Patient went to OSH ED for evaluation.  She was hemodynamically stable.  CBC and CMP nonrevealing.  UA negative.  UDS negative.  Alcohol level negative.  CXR showed no acute changes.  Head CT showed no acute changes.  Patient was transferred to [facility] for further evaluation; 12/03/21 MRI head indicated Moderate-sized acute left MCA distribution infarct involving the  left temporal occipital region as above. No associated hemorrhage or  significant regional mass effect.  2. Underlying age-related cerebral atrophy with moderate chronic  microvascular ischemic disease, with multiple scattered remote  lacunar infarcts involving the bilateral basal ganglia/corona  radiata, left thalamus, and left ventral pons; SLE/BSE generated ?Type of Study: Bedside Swallow Evaluation ?Previous Swallow Assessment: n/a ?Diet Prior to this Study: NPO ?Temperature Spikes Noted: No ?Respiratory Status: Nasal cannula ?History of Recent Intubation: No ?Behavior/Cognition: Alert;Confused;Requires cueing ?Oral Cavity  Assessment: Dry ?Oral Care Completed by SLP: Yes (in conjunction with patient) ?Oral Cavity - Dentition: Adequate natural dentition ?Vision: Functional for self-feeding ?Self-Feeding Abilities: Able to feed self;Needs assist ?Patient Positioning: Upright in bed;Other (comment) (on side of bed) ?Baseline Vocal Quality: Normal ?Volitional Cough: Strong ?Volitional Swallow: Able to elicit  ?  ?Oral/Motor/Sensory Function Overall Oral Motor/Sensory Function: Within functional limits   ?Ice Chips Ice chips: Within functional limits ?Presentation: Spoon   ?Thin Liquid Thin Liquid: Within functional limits ?Presentation: Cup;Straw;Spoon  ?  ?Nectar Thick Nectar Thick Liquid: Not tested   ?Honey Thick Honey Thick Liquid: Not tested   ?Puree Puree: Within functional limits ?Presentation: Self Fed   ?Solid ? ? ?  Solid: Within functional limits ?Presentation: Self Fed  ? ?  ? ?Tressie Stalker, M.S., CCC-SLP ?12/04/2021,10:07 AM ? ? ? ?

## 2021-12-04 NOTE — Evaluation (Signed)
Occupational Therapy Evaluation ?Patient Details ?Name: Rebecca Barnes ?MRN: [id number] ?DOB: May 23, 1946 ?Today's Date: 12/04/2021 ? ? ?History of Present Illness 76 yo female presenting to ED on 4/13 with aphasia. MRI showing L MCA including L tempral occipital region  and scattered remote infarcts in the basal ganglia, thalamus, and pons. PMH including anxiety, DM, HTN, and CVA.  ? ?Clinical Impression ?  ?PTA, pt was living with her daughter and was performing BADLs and light IADLs; using RW for mobility (information provided by daughter). Pt currently requiring Min A for UB ADLs, Mod-Max A for LB ADLs,  and Min Guard A-Min A for functional mobility with RW. Pt presenting with aphasia, decreased functional use of RUE, poor balance, and decreased cognition. Pt very motivated and has good family support. Pt will benefit from additional acute OT for safety dc and increasing occupational performance. Due to pt's change in functional status, high motivation, and supportive family, recommend dc to AIR for intensive OT to optimize safety and independence. ?   ? ?Recommendations for follow up therapy are one component of a multi-disciplinary discharge planning process, led by the attending physician.  Recommendations may be updated based on patient status, additional functional criteria and insurance authorization.  ? ?Follow Up Recommendations ? Acute inpatient rehab (3hours/day)  ?  ?Assistance Recommended at Discharge Frequent or constant Supervision/Assistance  ?Patient can return home with the following   ? ?  ?Functional Status Assessment ? Patient has had a recent decline in their functional status and demonstrates the ability to make significant improvements in function in a reasonable and predictable amount of time.  ?Equipment Recommendations ? None recommended by OT  ?  ?Recommendations for Other Services Rehab consult;Speech consult;PT consult ? ? ?  ?Precautions / Restrictions Precautions ?Precautions:  Fall ?Precaution Comments: monitor HR ?Restrictions ?Weight Bearing Restrictions: No  ? ?  ? ?Mobility Bed Mobility ?Overal bed mobility: Needs Assistance ?Bed Mobility: Rolling, Sidelying to Sit ?Rolling: Min guard ?Sidelying to sit: Min assist ?  ?  ?  ?General bed mobility comments: Min A for elevating trunk ?  ? ?Transfers ?Overall transfer level: Needs assistance ?Equipment used: Rolling walker (2 wheels), 1 person hand held assist ?Transfers: Sit to/from Stand ?Sit to Stand: Min guard ?  ?  ?  ?  ?  ?General transfer comment: Min guard A for safety ?  ? ?  ?Balance Overall balance assessment: Needs assistance ?Sitting-balance support: Feet supported ?Sitting balance-Leahy Scale: Fair ?Sitting balance - Comments: cued postural midline ?  ?Standing balance support: Bilateral upper extremity supported, During functional activity, Single extremity supported ?Standing balance-Leahy Scale: Poor ?Standing balance comment: Reliant on atleast one UE for support ?  ?  ?  ?  ?  ?  ?  ?  ?  ?  ?  ?   ? ?ADL either performed or assessed with clinical judgement  ? ?ADL Overall ADL's : Needs assistance/impaired ?Eating/Feeding: Set up;Sitting ?  ?Grooming: Wash/dry hands;Minimal assistance;Standing ?Grooming Details (indicate cue type and reason): Min Guard A for balance. Min A for tactiles cues to locate and use soap. ?Upper Body Bathing: Minimal assistance;Sitting ?  ?Lower Body Bathing: Moderate assistance;Sit to/from stand ?  ?Upper Body Dressing : Minimal assistance;Sitting ?  ?Lower Body Dressing: Moderate assistance ?  ?Toilet Transfer: Minimal assistance;Stand-pivot;Rolling walker (2 wheels) (simulated to recliner) ?  ?  ?  ?  ?  ?Functional mobility during ADLs: Minimal assistance;Cueing for sequencing ?General ADL Comments: Upon arrival, pt with bowel incontenience in  bed. Requiring Mod A for peri care in standing. Pt performing functional mobility with Min A and Rw. Pt with decreased cognition and coorindation.  Poor engagment of RUE  ? ? ? ?Vision Baseline Vision/History: 1 Wears glasses ("all the time") ?Vision Assessment?: Vision impaired- to be further tested in functional context ?Additional Comments: Unabel to perform tracking without moving head. Possible diplopia as pt attmeptign to press call button but pushing directly beside it  ?   ?Perception   ?  ?Praxis   ?  ? ?Pertinent Vitals/Pain Pain Assessment ?Pain Assessment: Faces ?Faces Pain Scale: No hurt ?Pain Intervention(s): Monitored during session, Limited activity within patient's tolerance, Repositioned  ? ? ? ?Hand Dominance Right ?  ?Extremity/Trunk Assessment Upper Extremity Assessment ?Upper Extremity Assessment: RUE deficits/detail ?RUE Deficits / Details: Decreased strength and coorindation. Difficulty performing opposition and decreased engagment of RUE into ADLs despite being dominant hand ?RUE Coordination: decreased fine motor;decreased gross motor ?  ?Lower Extremity Assessment ?Lower Extremity Assessment: Defer to PT evaluation ?RLE Deficits / Details: weakness of 4- to 4 strength ?RLE Coordination: decreased gross motor ?  ?Cervical / Trunk Assessment ?Cervical / Trunk Assessment: Kyphotic ?  ?Communication Communication ?Communication: Expressive difficulties (had moments of better speech with word salad) ?  ?Cognition Arousal/Alertness: Awake/alert ?Behavior During Therapy: [facility] for tasks assessed/performed, Anxious ?Overall Cognitive Status: Difficult to assess ?  ?  ?  ?  ?  ?  ?  ?  ?  ?  ?  ?  ?  ?  ?  ?  ?General Comments: Following simple one step commands. Poor problem solving when sequencing multistep tasks. Difficult to fully assess due to aphasia ?  ?  ?General Comments  VSS. Daughter arriving at end of session and providing home and PLOF information ? ?  ?Exercises   ?  ?Shoulder Instructions    ? ? ?Home Living Family/patient expects to be discharged to:: Private residence ?Living Arrangements: Children (Daughter) ?Available Help at  Discharge: Family;Available PRN/intermittently ?Type of Home: House ?Home Access: Ramped entrance ?  ?  ?Home Layout: Two level;Full bath on main level;Able to live on main level with bedroom/bathroom ?  ?  ?Bathroom Shower/Tub: Walk-in shower ?  ?Bathroom Toilet: Handicapped height ?  ?  ?Home Equipment: Agricultural consultant (2 wheels);Shower seat;Grab bars - toilet;Grab bars - tub/shower ?  ?  ? Lives With: Daughter ? ?  ?Prior Functioning/Environment Prior Level of Function : Independent/Modified Independent ?  ?  ?  ?  ?  ?  ?Mobility Comments: Uses RW per daughter ?ADLs Comments: Performs BADLs and light IADLs per daughter ?  ? ?  ?  ?OT Problem List: Decreased strength;Decreased range of motion;Decreased activity tolerance;Impaired balance (sitting and/or standing);Decreased knowledge of use of DME or AE;Decreased knowledge of precautions ?  ?   ?OT Treatment/Interventions: Self-care/ADL training;Therapeutic exercise;Energy conservation;DME and/or AE instruction;Therapeutic activities  ?  ?OT Goals(Current goals can be found in the care plan section) Acute Rehab OT Goals ?Patient Stated Goal: Get strongers so she can return home with daughter ?OT Goal Formulation: With patient/family ?Time For Goal Achievement: 12/18/21 ?Potential to Achieve Goals: Good  ?OT Frequency: Min 2X/week ?  ? ?Co-evaluation   ?  ?  ?  ?  ? ?  ?AM-PAC OT "6 Clicks" Daily Activity     ?Outcome Measure Help from another person eating meals?: A Little ?Help from another person taking care of personal grooming?: A Little ?Help from another person toileting, which includes using  toliet, bedpan, or urinal?: A Lot ?Help from another person bathing (including washing, rinsing, drying)?: A Lot ?Help from another person to put on and taking off regular upper body clothing?: A Little ?Help from another person to put on and taking off regular lower body clothing?: A Lot ?6 Click Score: 15 ?  ?End of Session Equipment Utilized During Treatment: Rolling  walker (2 wheels);Gait belt ?Nurse Communication: Mobility status ? ?Activity Tolerance: Patient tolerated treatment well ?Patient left: in chair;with call bell/phone within reach;with chair alarm set;with fami

## 2021-12-04 NOTE — Progress Notes (Signed)
Echocardiogram ?2D Echocardiogram has been performed. ? ?Devonne Doughty ?12/04/2021, 11:34 AM ?

## 2021-12-04 NOTE — Progress Notes (Signed)
Physical Therapy Evaluation ?Patient Details ?Name: Rebecca RavelMaryann Urbanik ?MRN: [id number] ?DOB: 01-Jan-1946 ?Today's Date: 12/04/2021 ? ?History of Present Illness ? 76 yo female with onset of AMS and speech changes was admitted 4/12 for evaluation.  Pt has expressive aphasia with word salad, has R side weakness and low K+.  Dx with L MCA infarct, atelectasis, repleted K+.  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 Pt was seen for progression of gait with mildly greater R LE weakness and trouble with organizing her movement to get to BR for example.  Pt is struggling with lines and understanding the safety of movement, very unaware of her tendency to stand with quick impulsivity as a real fall risk.  Recommend her to be admitted to CIR for ST, PT and OT needs with dense symptoms despite being mild.  Pt is more a concern cognitively to get home but has family support to make the transition.  Follow for acute PT goals as are outlined below.   ?   ? ?Recommendations for follow up therapy are one component of a multi-disciplinary discharge planning process, led by the attending physician.  Recommendations may be updated based on patient status, additional functional criteria and insurance authorization. ? ?Follow Up Recommendations Acute inpatient rehab (3hours/day) ? ?  ?Assistance Recommended at Discharge Frequent or constant Supervision/Assistance  ?Patient can return home with the following ? A lot of help with walking and/or transfers;A lot of help with bathing/dressing/bathroom;Assistance with cooking/housework;Assist for transportation;Help with stairs or ramp for entrance ? ?  ?Equipment Recommendations None recommended by PT  ?Recommendations for Other Services ? Rehab consult  ?  ?Functional Status Assessment Patient has had a recent decline in their functional status and demonstrates the ability to make significant improvements  in function in a reasonable and predictable amount of time.  ? ?  ?Precautions / Restrictions Precautions ?Precautions: Fall ?Precaution Comments: monitor HR ?Restrictions ?Weight Bearing Restrictions: No  ? ?  ? ?Mobility ? Bed Mobility ?Overal bed mobility: Needs Assistance ?Bed Mobility: Supine to Sit, Sit to Supine ?  ?  ?Supine to sit: Min guard, Min assist ?Sit to supine: Min guard, Min assist ?  ?General bed mobility comments: min guard with rail but min assist for trunk to get OOB and legs to return to bed ?  ? ?Transfers ?Overall transfer level: Needs assistance ?Equipment used: Rolling walker (2 wheels), 1 person hand held assist ?Transfers: Sit to/from Stand ?Sit to Stand: Min guard, Min assist ?  ?  ?  ?  ?  ?General transfer comment: min assist initially but in BR was min guard ?  ? ?Ambulation/Gait ?Ambulation/Gait assistance: Min guard ?Gait Distance (Feet): 35 Feet ?Assistive device: Rolling walker (2 wheels), 1 person hand held assist ?Gait Pattern/deviations: Step-to pattern, Step-through pattern, Decreased stride length ?Gait velocity: reduced ?Gait velocity interpretation: <1.31 ft/sec, indicative of household ambulator ?  ?General Gait Details: walked in room on RW with help to direct and manage copious lines ? ?Stairs ?  ?  ?  ?  ?  ? ?Wheelchair Mobility ?  ? ?Modified Rankin (Stroke Patients Only) ?Modified Rankin (Stroke Patients Only) ?Pre-Morbid Rankin Score: No symptoms ?Modified Rankin: Moderate disability ? ?  ? ?Balance Overall balance assessment: Needs assistance ?Sitting-balance support: Feet supported ?Sitting balance-Leahy Scale: Fair ?Sitting balance - Comments: cued postural midline ?  ?  ?Standing balance-Leahy Scale: Poor ?Standing balance comment: used RW for  correction of standing balance ?  ?  ?  ?  ?  ?  ?  ?  ?  ?  ?  ?   ? ? ? ?Pertinent Vitals/Pain Pain Assessment ?Pain Assessment: Faces ?Faces Pain Scale: No hurt  ? ? ?Home Living Family/patient expects to be discharged  to:: Private residence ?Living Arrangements: Children (Daughter) ?Available Help at Discharge: Family;Available PRN/intermittently ?Type of Home: House ?Home Access: Ramped entrance ?  ?  ?  ?Home Layout: Two level;Full bath on main level;Able to live on main level with bedroom/bathroom ?Home Equipment: Agricultural consultant (2 wheels);Shower seat;Grab bars - toilet;Grab bars - tub/shower ?   ?  ?Prior Function Prior Level of Function : Independent/Modified Independent ?  ?  ?  ?  ?  ?  ?Mobility Comments: Uses RW per daughter ?ADLs Comments: Performs BADLs and light IADLs per daughter ?  ? ? ?Hand Dominance  ? Dominant Hand: Right ? ?  ?Extremity/Trunk Assessment  ? Upper Extremity Assessment ?Upper Extremity Assessment: Defer to OT evaluation ?  ? ?Lower Extremity Assessment ?Lower Extremity Assessment: RLE deficits/detail ?RLE Deficits / Details: weakness of 4- to 4 strength ?RLE Coordination: decreased gross motor ?  ? ?Cervical / Trunk Assessment ?Cervical / Trunk Assessment: Kyphotic  ?Communication  ? Communication: Expressive difficulties (had moments of better speech with word salad)  ?Cognition Arousal/Alertness: Awake/alert ?Behavior During Therapy: Impulsive, Anxious ?Overall Cognitive Status: No family/caregiver present to determine baseline cognitive functioning ?  ?  ?  ?  ?  ?  ?  ?  ?  ?  ?  ?  ?  ?  ?  ?  ?General Comments: From ALF and unsure of her baseline given stroke hisotry ?  ?  ? ?  ?General Comments General comments (skin integrity, edema, etc.): Pt is up to walk with help and was SOB with effort despite having sats 97% after walking ? ?  ?Exercises    ? ?Assessment/Plan  ?  ?PT Assessment    ?PT Problem List   ? ?   ?  ?PT Treatment Interventions     ? ?PT Goals (Current goals can be found in the Care Plan section)  ?Acute Rehab PT Goals ?Patient Stated Goal: none clearly stated ?PT Goal Formulation: With patient ?Time For Goal Achievement: 12/18/21 ?Potential to Achieve Goals: Good ? ?   ?Frequency Min 4X/week ?  ? ? ?Co-evaluation   ?  ?  ?  ?  ? ? ?  ?AM-PAC PT "6 Clicks" Mobility  ?Outcome Measure Help needed turning from your back to your side while in a flat bed without using bedrails?: A Little ?Help needed moving from lying on your back to sitting on the side of a flat bed without using bedrails?: A Little ?Help needed moving to and from a bed to a chair (including a wheelchair)?: A Little ?Help needed standing up from a chair using your arms (e.g., wheelchair or bedside chair)?: A Little ?Help needed to walk in hospital room?: A Little ?Help needed climbing 3-5 steps with a railing? : A Lot ?6 Click Score: 17 ? ?  ?End of Session Equipment Utilized During Treatment: Gait belt;Oxygen ?Activity Tolerance: Patient tolerated treatment well;Treatment limited secondary to medical complications (Comment) ?Patient left: in bed;with call bell/phone within reach;with bed alarm set ?Nurse Communication: Mobility status ?PT Visit Diagnosis: Unsteadiness on feet (R26.81);Other abnormalities of gait and mobility (R26.89);Repeated falls (R29.6);Muscle weakness (generalized) (M62.81);Difficulty in walking, not elsewhere classified (R26.2) ?  ? ?  Time: 8016-5537 ?PT Time Calculation (min) (ACUTE ONLY): 35 min ? ? ?Charges:   PT Evaluation ?$PT Eval Moderate Complexity: 1 Mod ?PT Treatments ?$Therapeutic Activity: 8-22 mins ?  ?   ? ?Ivar Drape ?12/04/2021, 3:58 PM ? ?Samul Dada, PT PhD ?Acute Rehab Dept. Number: [facility] [phone] and [facility] [phone] ? ?

## 2021-12-05 ENCOUNTER — Inpatient Hospital Stay (HOSPITAL_COMMUNITY): Payer: Medicare Other

## 2021-12-05 DIAGNOSIS — R2990 Unspecified symptoms and signs involving the nervous system: Secondary | ICD-10-CM | POA: Diagnosis not present

## 2021-12-05 DIAGNOSIS — I10 Essential (primary) hypertension: Secondary | ICD-10-CM | POA: Diagnosis not present

## 2021-12-05 DIAGNOSIS — E669 Obesity, unspecified: Secondary | ICD-10-CM | POA: Diagnosis not present

## 2021-12-05 DIAGNOSIS — I639 Cerebral infarction, unspecified: Secondary | ICD-10-CM

## 2021-12-05 DIAGNOSIS — I63412 Cerebral infarction due to embolism of left middle cerebral artery: Secondary | ICD-10-CM

## 2021-12-05 DIAGNOSIS — R4701 Aphasia: Secondary | ICD-10-CM | POA: Diagnosis not present

## 2021-12-05 DIAGNOSIS — Z8673 Personal history of transient ischemic attack (TIA), and cerebral infarction without residual deficits: Secondary | ICD-10-CM | POA: Diagnosis not present

## 2021-12-05 LAB — CBC WITH DIFFERENTIAL/PLATELET
Abs Immature Granulocytes: 0.03 10*3/uL (ref 0.00–0.07)
Basophils Absolute: 0 10*3/uL (ref 0.0–0.1)
Basophils Relative: 0 %
Eosinophils Absolute: 0.2 10*3/uL (ref 0.0–0.5)
Eosinophils Relative: 2 %
HCT: 41.1 % (ref 36.0–46.0)
Hemoglobin: 13.5 g/dL (ref 12.0–15.0)
Immature Granulocytes: 0 %
Lymphocytes Relative: 34 %
Lymphs Abs: 3.2 10*3/uL (ref 0.7–4.0)
MCH: 26.9 pg (ref 26.0–34.0)
MCHC: 32.8 g/dL (ref 30.0–36.0)
MCV: 82 fL (ref 80.0–100.0)
Monocytes Absolute: 1.2 10*3/uL — ABNORMAL HIGH (ref 0.1–1.0)
Monocytes Relative: 13 %
Neutro Abs: 4.8 10*3/uL (ref 1.7–7.7)
Neutrophils Relative %: 51 %
Platelets: 382 10*3/uL (ref 150–400)
RBC: 5.01 MIL/uL (ref 3.87–5.11)
RDW: 13.4 % (ref 11.5–15.5)
WBC: 9.3 10*3/uL (ref 4.0–10.5)
nRBC: 0 % (ref 0.0–0.2)

## 2021-12-05 LAB — COMPREHENSIVE METABOLIC PANEL
ALT: 13 U/L (ref 0–44)
AST: 15 U/L (ref 15–41)
Albumin: 3.5 g/dL (ref 3.5–5.0)
Alkaline Phosphatase: 86 U/L (ref 38–126)
Anion gap: 8 (ref 5–15)
BUN: 13 mg/dL (ref 8–23)
CO2: 23 mmol/L (ref 22–32)
Calcium: 9.3 mg/dL (ref 8.9–10.3)
Chloride: 105 mmol/L (ref 98–111)
Creatinine, Ser: 0.53 mg/dL (ref 0.44–1.00)
GFR, Estimated: >60 mL/min (ref >60)
Glucose, Bld: 130 mg/dL — ABNORMAL HIGH (ref 70–99)
Potassium: 3.3 mmol/L — ABNORMAL LOW (ref 3.5–5.1)
Sodium: 136 mmol/L (ref 135–145)
Total Bilirubin: 1.2 mg/dL (ref 0.3–1.2)
Total Protein: 6.7 g/dL (ref 6.5–8.1)

## 2021-12-05 LAB — MAGNESIUM: Magnesium: 1.9 mg/dL (ref 1.7–2.4)

## 2021-12-05 LAB — GLUCOSE, CAPILLARY
Glucose-Capillary: 149 mg/dL — ABNORMAL HIGH (ref 70–99)
Glucose-Capillary: 138 mg/dL — ABNORMAL HIGH (ref 70–99)
Glucose-Capillary: 130 mg/dL — ABNORMAL HIGH (ref 70–99)
Glucose-Capillary: 114 mg/dL — ABNORMAL HIGH (ref 70–99)

## 2021-12-05 LAB — BRAIN NATRIURETIC PEPTIDE: B Natriuretic Peptide: 12.8 pg/mL (ref 0.0–100.0)

## 2021-12-05 MED ORDER — POTASSIUM CHLORIDE CRYS ER 20 MEQ PO TBCR
40.0000 meq | EXTENDED_RELEASE_TABLET | Freq: Once | ORAL | Status: AC
  Administered 2021-12-05: 40 meq via ORAL
  Filled 2021-12-05: qty 2

## 2021-12-05 NOTE — Progress Notes (Signed)
STROKE TEAM PROGRESS NOTE  ? ?INTERVAL HISTORY ?RN at bedside.  Patient lying in bed, still has expressive aphasia, no acute distress, no acute event overnight.  Tentative CIR admission tomorrow. ? ?Vitals:  ? 12/05/21 0400 12/05/21 0815 12/05/21 1156 12/05/21 1609  ?BP: 127/71 (!) 150/82 (!) 149/66 (!) 158/56  ?Pulse: 89 93 86 88  ?Resp: 17 16 18 20   ?Temp: 98.4 ?F (36.9 ?C) 98.1 ?F (36.7 ?C) 98.1 ?F (36.7 ?C) 98.1 ?F (36.7 ?C)  ?TempSrc: Oral Oral Oral Oral  ?SpO2: 95% 94% 95% 93%  ?Weight:      ?Height:      ? ?CBC:  ?Recent Labs  ?Lab 12/02/21 ?1904 12/02/21 ?2124 12/04/21 ?12/06/21 12/05/21 ?0050  ?WBC 9.0  --  9.2 9.3  ?NEUTROABS 5.2  --   --  4.8  ?HGB 13.9   < > 14.2 13.5  ?HCT 42.5   < > 42.6 41.1  ?MCV 80.8  --  81.3 82.0  ?PLT 372  --  388 382  ? < > = values in this interval not displayed.  ? ?Basic Metabolic Panel:  ?Recent Labs  ?Lab 12/04/21 ?12/06/21 12/05/21 ?0050  ?NA 134* 136  ?K 3.1* 3.3*  ?CL 100 105  ?CO2 24 23  ?GLUCOSE 122* 130*  ?BUN 11 13  ?CREATININE 0.51 0.53  ?CALCIUM 9.2 9.3  ?MG  --  1.9  ? ?Lipid Panel:  ?Recent Labs  ?Lab 12/04/21 ?12/06/21  ?CHOL 222*  ?TRIG 163*  ?HDL 67  ?CHOLHDL 3.3  ?VLDL 33  ?LDLCALC 122*  ? ?HgbA1c:  ?Recent Labs  ?Lab 12/04/21 ?12/06/21  ?HGBA1C 6.3*  ? ?Urine Drug Screen:  ?Recent Labs  ?Lab 12/02/21 ?2118  ?LABOPIA NONE DETECTED  ?COCAINSCRNUR NONE DETECTED  ?LABBENZ NONE DETECTED  ?AMPHETMU NONE DETECTED  ?THCU NONE DETECTED  ?LABBARB NONE DETECTED  ?  ?Alcohol Level  ?Recent Labs  ?Lab 12/02/21 ?2118  ?ETH <10  ? ? ?IMAGING past 24 hours ?VAS 2119 LOWER EXTREMITY VENOUS (DVT) ? ?Result Date: 12/05/2021 ? Lower Venous DVT Study Patient Name:  LIZBETH FEIJOO  Date of Exam:   12/05/2021 Medical Rec #: 12/[id number]       Accession #:    [id number] Date of Birth: December 04, 1945       Patient Gender: F Patient Age:   76 years Exam Location:  [facility] Procedure:      VAS [facility] LOWER EXTREMITY VENOUS (DVT) Referring Phys: [place] Donnis Phaneuf  --------------------------------------------------------------------------------  Indications: Stroke.  Comparison Study: No prior study Performing Technologist: Scheryl Marten RVS  Examination Guidelines: A complete evaluation includes B-mode imaging, spectral Doppler, color Doppler, and power Doppler as needed of all accessible portions of each vessel. Bilateral testing is considered an integral part of a complete examination. Limited examinations for reoccurring indications may be performed as noted. The reflux portion of the exam is performed with the patient in reverse Trendelenburg.  +---------+---------------+---------+-----------+----------+--------------+ RIGHT    CompressibilityPhasicitySpontaneityPropertiesThrombus Aging +---------+---------------+---------+-----------+----------+--------------+ CFV      Full           Yes      Yes                                 +---------+---------------+---------+-----------+----------+--------------+ SFJ      Full                                                        +---------+---------------+---------+-----------+----------+--------------+  FV Prox  Full                                                        +---------+---------------+---------+-----------+----------+--------------+ FV Mid   Full                                                        +---------+---------------+---------+-----------+----------+--------------+ FV DistalFull                                                        +---------+---------------+---------+-----------+----------+--------------+ PFV      Full                                                        +---------+---------------+---------+-----------+----------+--------------+ POP      Full           Yes      Yes                                 +---------+---------------+---------+-----------+----------+--------------+ PTV      Full                                                         +---------+---------------+---------+-----------+----------+--------------+ PERO     Full                                                        +---------+---------------+---------+-----------+----------+--------------+   +---------+---------------+---------+-----------+----------+--------------+ LEFT     CompressibilityPhasicitySpontaneityPropertiesThrombus Aging +---------+---------------+---------+-----------+----------+--------------+ CFV      Full           Yes      Yes                                 +---------+---------------+---------+-----------+----------+--------------+ SFJ      Full                                                        +---------+---------------+---------+-----------+----------+--------------+ FV Prox  Full                                                        +---------+---------------+---------+-----------+----------+--------------+  FV Mid   Full                                                        +---------+---------------+---------+-----------+----------+--------------+ FV DistalFull                                                        +---------+---------------+---------+-----------+----------+--------------+ PFV      Full                                                        +---------+---------------+---------+-----------+----------+--------------+ POP      Full           Yes      Yes                                 +---------+---------------+---------+-----------+----------+--------------+ PTV      Full                                                        +---------+---------------+---------+-----------+----------+--------------+ PERO     Full                                                        +---------+---------------+---------+-----------+----------+--------------+    Summary: BILATERAL: - No evidence of deep vein thrombosis seen in the lower extremities, bilaterally. -    *See table(s) above for measurements and observations.    Preliminary    ? ?PHYSICAL EXAM ? ?Temp:  [98.1 ?F (36.7 ?C)-98.5 ?F (36.9 ?C)] 98.1 ?F (36.7 ?C) (04/15 1609) ?Pulse Rate:  [86-109] 88 (04/15 1609) ?Resp:  [16-20] 20 (04/15 1609) ?BP: (127-158)/(56-97) 158/56 (04/15 1609) ?SpO2:  [90 %-95 %] 93 % (04/15 1609) ? ?General - Well nourished, well developed, in no apparent distress. ? ?Ophthalmologic - fundi not visualized due to noncooperation. ? ?Cardiovascular - Regular rhythm and rate. ? ?Neuro - awake, alert, followed most simple commands, able to mimic.  Naming and repetition are impaired.  Expressive aphasia with nonsensical words, not able to answer orientation questions.  She is unable to read. Blinking to visual threat bilaterally, no gaze palsy, tracking bilaterally, mild right facial droop. BUE and BLE symmetrical muscle strength, 4/5. Sensation subjectively symmetrical, coordination slow but intact bilaterally. Gait not tested.  ? ? ?ASSESSMENT/PLAN ?Ailene RavelMaryann Ricciuti is a 76 year old female with a PMHx of T2DM, HTN, and previous CVA with resulting language difficulty. She presented for worsening aphasia for several days, exam with no focal sensory or motor deficit. MRI revealed a moderate-sized acute L MCA territory stroke and scattered remote infarcts in the basal ganglia, thalamus,  and pons.  ? ?Stroke: large acute L MCA territory stroke, etiology unclear ?CT head no acute abnormality ?CTA head & neck: occlusion of L M2, severe stenosis of bilateral M2 and bilateral P2.  Left ICA siphon atherosclerosis ?MRI: Large acute L MCA territory stroke and scattered remote infarcts in the bilateral BG/CR, left thalamus, and left pons ?2D Echo EF 70 to 75%, no thrombus, interatrial septum not well visualized ?LE venous Doppler no DVT ?LDL 122 ?HgbA1c 6.3 ?VTE prophylaxis - Lovenox 40 mg ?aspirin 81 mg daily prior to admission, now on aspirin 81 mg daily and clopidogrel 75 mg daily. Will continue for three  months due to large vessel occlusion, then take Plavix alone indefinitely.  ?Patient considering enrollment in OCEANIC trial ?Recommend 30-day cardiac event monitoring as outpatient.  If negative, may consider loop recorder. ?Therapy recommendations: CIR ?Disposition: Pending ? ?History of stroke ?05/28/2020 admitted for dizziness, nausea vomiting, imbalance gait, diplopia and right-sided

## 2021-12-05 NOTE — Progress Notes (Addendum)
?                                  PROGRESS NOTE                                             ?                                                                                                                     ?                                         ? ? Patient Demographics:  ? ? Rebecca Barnes, is a 76 y.o. female, DOB - 02/14/1946, VPX:[id number] ? ?Outpatient Primary MD for the patient is [facility]    LOS - 2  Admit date - 12/02/2021   ? ?Chief Complaint  ?Patient presents with  ? Altered Mental Status  ?    ? ?Brief Narrative (HPI from H&P)   76 year old Caucasian female who lives at home with previous known medical history of diabetes mellitus type 2, hypertension, previous history of stroke who presented with 48-hour duration history of difficulty to speak.  Work-up consistent with MRI with large left MCA territory ischemic infarct and she was admitted for further work-up.  ? ? Subjective:  ? ? [name] today has, No headache, No chest pain, No abdominal pain unreliable historian due to expressive aphasia. ? ? Assessment  & Plan :  ? ? ?Acute left MCA distribution infarct involving the left temporal occipital region - she has been seen neurology team currently on Aspirin + Plavix ( 3 mths then Plavix alone) , LDL is above goal and A1c is borderline.  She will be started on Zetia and niacin combination due to statin allergy, may as well may require dual antiplatelet therapy but will defer this to the stroke team.  Full stroke work-up underway.  Outpatient cardiology follow-up for loop recorder, also leg venous ultrasound ordered by neurology team. DW Dr. Roda Shutters 12/05/21. ? ?2.  Dyslipidemia.  Zetia and niacin combination.  Stated statin allergy. ? ?3.  Obesity with BMI of 37.  Follow with PCP for weight loss. ? ?4.  Hypokalemia.  Replaced. ? ?5.  Depression.  Home medications. ? ?6.  Hypertension.  Allow for permissive hypertension Home antihypertensives on  hold. ? ?7.  DM type II.  Borderline A1c.  Sliding scale for now.  Glucophage on hold. ? ?Lab Results  ?Component Value Date  ? HGBA1C 6.3 (H) 12/04/2021  ? ?Lab Results  ?Component Value Date  ? CHOL 222 (H) 12/04/2021  ? HDL 67 12/04/2021  ? LDLCALC 122 (H) 12/04/2021  ? TRIG 163 (H)  12/04/2021  ? CHOLHDL 3.3 12/04/2021  ? ? ?CBG (last 3)  ?Recent Labs  ?  12/04/21 ?1546 12/04/21 ?2125 12/05/21 ?3086  ?GLUCAP 140* 143* 149*  ? ? ? ?   ? ?Condition - Extremely Guarded ? ?Family Communication  : Daughter [name] [phone] message left on 12/04/2021 at 11:10 AM, called on 12/05/2021 at 11:15 AM and message left ? ?Code Status :  Full ? ?Consults  :  Neuro ? ?PUD Prophylaxis :  ? ? Procedures  :    ? ?Leg [place] - ? ?Carotid -  ? ? ?MRI - 1. Moderate-sized acute left MCA distribution infarct involving the left temporal occipital region as above. No associated hemorrhage or significant regional mass effect. 2. Underlying age-related cerebral atrophy with moderate chronic microvascular ischemic disease, with multiple scattered remote lacunar infarcts involving the bilateral basal ganglia/corona radiata, left thalamus, and left ventral pons.  ? ?CTA -   ? ?CT head: 1. Known sizable evolving acute cortical/subcortical infarct within the left temporal, parietal and occipital lobes (left MCA territory and left MCA/PCA watershed territory). The infarct has not significantly progressed in extent from yesterday's brain MRI. Mild local mass effect without ventricular effacement or midline shift. No evidence of hemorrhagic conversion. 2. Stable background chronic small vessel ischemic disease with multiple chronic lacunar infarcts, as described. 3. Mild generalized parenchymal atrophy.  ? ?CTA neck: 1. The common carotid, internal carotid and vertebral arteries are patent within the neck without hemodynamically significant stenosis. Mild atherosclerotic disease within the bilateral carotid systems within the neck, as described. 2.   Aortic Atherosclerosis (ICD10-I70.0). 3. Cervical spondylosis.  ? ?CTA head: 1. An inferior division proximal M2 left MCA vessel becomes occluded shortly beyond its origin. 2. Intracranial atherosclerotic disease with multifocal stenoses, most notably as follows. 3. Severe focal stenosis within a superior division mid M2 right MCA vessel. 4. Severe focal stenosis within a distal M2 right MCA vessel. 5. Severe stenosis within a superior division proximal to mid M2 left MCA vessel. 6. Severe stenosis within the P2 left PCA. 7. Moderate stenosis within the P2 right PCA.  ? ?TTE -  1. Left ventricular ejection fraction, by estimation, is 70 to 75%. The left ventricle has hyperdynamic function. The left ventricle has no regional wall motion abnormalities. Left ventricular diastolic function could not be evaluated.  2. Right ventricular systolic function was not well visualized. The right ventricular size is not well visualized. There is normal pulmonary artery systolic pressure.  3. The mitral valve is normal in structure. No evidence of mitral valve regurgitation. No evidence of mitral stenosis.  4. The aortic valve is normal in structure. Aortic valve regurgitation is not visualized. No aortic stenosis is present. ? ? ?   ? ?Disposition Plan  :   ? ?Status is: Inpatient ? ?DVT Prophylaxis  :   ? ?enoxaparin (LOVENOX) injection 40 mg Start: 12/03/21 2200 ? ?Lab Results  ?Component Value Date  ? PLT 382 12/05/2021  ? ? ?Diet :  ?Diet Order   ? ?       ?  Diet Carb Modified Fluid consistency: Thin; Room service appropriate? Yes  Diet effective now       ?  ? ?  ?  ? ?  ?  ? ?Inpatient Medications ? ?Scheduled Meds: ? aspirin EC  81 mg Oral Daily  ? clopidogrel  75 mg Oral Daily  ? enoxaparin (LOVENOX) injection  40 mg Subcutaneous Q24H  ? ezetimibe  10 mg Oral  Daily  ? FLUoxetine  20 mg Oral Daily  ? insulin aspart  0-5 Units Subcutaneous QHS  ? insulin aspart  0-9 Units Subcutaneous TID WC  ? mouth rinse  15 mL Mouth  Rinse BID  ? melatonin  10 mg Oral QHS  ? multivitamin with minerals  1 tablet Oral Daily  ? niacin  100 mg Oral QHS  ? sodium chloride  1 g Oral TID WC  ? ?Continuous Infusions: ?PRN Meds:.acetaminophen **OR** acetaminophen, clonazePAM, ondansetron **OR** ondansetron (ZOFRAN) IV ? ?Antibiotics  :   ? ?Anti-infectives (From admission, onward)  ? ? None  ? ?  ? ? ? Time Spent in minutes  30 ? ? ?Susa RaringPrashant Yohance Hathorne M.D on 12/05/2021 at 11:12 AM ? ?To page go to [email or web address]  ? ?[facility] -  Office  [phone] ? ?See all Orders from today for further details ? ? ? Objective:  ? ?Vitals:  ? 12/04/21 2000 12/05/21 0000 12/05/21 0400 12/05/21 0815  ?BP: (!) 152/89 132/72 127/71 (!) 150/82  ?Pulse: (!) 109 97 89 93  ?Resp: 20 18 17 16   ?Temp:  98.2 ?F (36.8 ?C) 98.4 ?F (36.9 ?C) 98.1 ?F (36.7 ?C)  ?TempSrc:  Oral Oral Oral  ?SpO2: 90% 94% 95% 94%  ?Weight:      ?Height:      ? ? ?Wt Readings from Last 3 Encounters:  ?12/02/21 103.2 kg  ?12/02/21 103.2 kg  ?05/01/21 98.9 kg  ? ? ? ?Intake/Output Summary (Last 24 hours) at 12/05/2021 1112 ?Last data filed at 12/05/2021 0600 ?Gross per 24 hour  ?Intake 420.19 ml  ?Output 350 ml  ?Net 70.19 ml  ? ? ? ?Physical Exam ? ?Awake Alert, positive expressive aphasia, no significant weakness on the right side ?[place]oyd.AT,PERRAL ?Supple Neck, No JVD,   ?Symmetrical Chest wall movement, Good air movement bilaterally, CTAB ?RRR,No Gallops, Rubs or new Murmurs,  ?+ve B.Sounds, Abd Soft, No tenderness,   ?No Cyanosis, Clubbing or edema  ? ?  ? ? Data Review:  ? ? ?CBC ?Recent Labs  ?Lab 12/02/21 ?1103 12/02/21 ?1904 12/02/21 ?2124 12/04/21 ?[id number] 12/05/21 ?0050  ?WBC 6.4 9.0  --  9.2 9.3  ?HGB 13.9 13.9 12.6 14.2 13.5  ?HCT 43.4 42.5 37.0 42.6 41.1  ?PLT 392 372  --  388 382  ?MCV 83.0 80.8  --  81.3 82.0  ?MCH 26.6* 26.4  --  27.1 26.9  ?MCHC 32.0 32.7  --  33.3 32.8  ?RDW 13.1 13.5  --  13.4 13.4  ?LYMPHSABS 1,805 2.7  --   --  3.2  ?MONOABS  --  0.9  --   --  1.2*  ?EOSABS 102 0.2  --    --  0.2  ?BASOSABS 32 0.0  --   --  0.0  ? ? ?Electrolytes ?Recent Labs  ?Lab 12/02/21 ?1103 12/02/21 ?1904 12/02/21 ?2118 12/02/21 ?2124 12/04/21 ?[id number] 12/05/21 ?0050  ?NA 137 134*  --  138 134* 136  ?K 4.7 3

## 2021-12-05 NOTE — H&P (Signed)
? ? ?Physical Medicine and Rehabilitation Admission H&P ? ?  ?Chief Complaint  ?Patient presents with  ? Altered Mental Status  ?: ?HPI: 76 year old female with past medical history of prior CVAs, anxiety, diabetes mellitus type 2, hypertension, asthma, obesity, depression, marijuana abuse presented to the hospital on 12/02/2021 with left MCA infarct.  History taken from chart review due to aphasia.  Patient went to outside hospital for evaluation of word salad.  Lab work as well as x-ray was relatively unremarkable.  She was transferred to [facility].  CT of head and neck showed occlusion of left M2, severe stenosis of right M2 and left P2.  Echocardiogram was unremarkable.  MRI of the brain showed moderate-sized acute left MCA distribution infarct involving the left temporal occipital region with no associated hemorrhage or significant regional mass effect.  There is also mention of underlying age-related cerebral atrophy with moderate chronic microvascular ischemic disease as well as multiple scattered remote lacunar infarcts involving the bilateral basal ganglia/corona radiata, left thalamus, left ventral pons.  She was seen by neurology recommended Zio patch for 30 days and loop recorder as outpatient. Neurology recommended aspirin and Plavix x3 months then Plavix alone.  Lower extremity ultrasound was ordered by neurology as well.  Hospital course further complicated by hypokalemia, which was replaced.  Patient with resulting functional deficits with mobility, transfers, cognition, endurance.  Please see preadmission assessment from earlier today as well. ? ?ROS ?Limited due to aphasia ?Past Medical History:  ?Diagnosis Date  ? Anxiety   ? Asthma   ? Diabetes mellitus without complication (HCC)   ? Hypertension   ? Stroke [facility])   ? ?Past Surgical History:  ?Procedure Laterality Date  ? arm fracture    ? arm surgery  ? CHOLECYSTECTOMY    ? ?Family History  ?Problem Relation Age of Onset  ? Cancer  Mother   ? ?Social History:  reports that she has never smoked. She has never used smokeless tobacco. She reports that she does not currently use drugs after having used the following drugs: Marijuana. She reports that she does not drink alcohol. ?Allergies:  ?Allergies  ?Allergen Reactions  ? Januvia [Sitagliptin] Other (See Comments)  ?  dizziness  ? Statins Other (See Comments)  ?  Hospitalized on 2 occasions after trying statins. Patient became dizzy and increased fall risk  ? Sulfa Antibiotics Other (See Comments)  ?  Childhood Allergy   ? ?Medications Prior to Admission  ?Medication Sig Dispense Refill  ? acetaminophen (TYLENOL) 325 MG tablet Take 650 mg by mouth every 6 (six) hours as needed for mild pain or headache.    ? amLODipine (NORVASC) 5 MG tablet TAKE 1 TABLET(5 MG) BY MOUTH DAILY (Patient taking differently: Take 5 mg by mouth daily.) 90 tablet 1  ? aspirin EC 81 MG tablet Take 1 tablet (81 mg total) by mouth daily. Swallow whole. 30 tablet 0  ? clonazePAM (KLONOPIN) 0.5 MG tablet Take 0.5 mg by mouth 2 (two) times daily as needed for anxiety.    ? FLUoxetine (PROZAC) 20 MG capsule Take one capsule by mouth once daily. (Patient taking differently: Take 20 mg by mouth daily.) 90 capsule 1  ? Melatonin 10 MG CAPS Take 10 mg by mouth at bedtime.    ? metFORMIN (GLUCOPHAGE XR) 500 MG 24 hr tablet Take two tablet by mouth daily. (Patient taking differently: Take 1,000 mg by mouth daily.) 180 tablet 1  ? Multiple Vitamin (MULTI-VITAMIN) tablet Take 1 tablet  by mouth daily. 30 tablet 0  ? sodium chloride 1 g tablet TAKE 1 TABLET(1 GRAM) BY MOUTH THREE TIMES DAILY (Patient taking differently: Take 1 g by mouth 3 (three) times daily with meals.) 90 tablet 1  ? [DISCONTINUED] losartan (COZAAR) 100 MG tablet TAKE 1 TABLET(100 MG) BY MOUTH DAILY (Patient taking differently: Take 100 mg by mouth daily.) 30 tablet 3  ? ? ?Drug Regimen Review ?Drug regimen was reviewed and remains appropriate with no significant  issues identified ? ?Home: ?Home Living ?Family/patient expects to be discharged to:: Private residence ?Living Arrangements: Children (Daughter) ?Available Help at Discharge: Family, Available PRN/intermittently ?Type of Home: House ?Home Access: Ramped entrance ?Home Layout: Two level, Full bath on main level, Able to live on main level with bedroom/bathroom ?Bathroom Shower/Tub: Walk-in shower ?Bathroom Toilet: Handicapped height ?Home Equipment: Rolling Walker (2 wheels), Shower seat, Grab bars - toilet, Grab bars - tub/shower ? Lives With: Daughter ?  ?Functional History: ?Prior Function ?Prior Level of Function : Independent/Modified Independent ?Mobility Comments: Uses RW per daughter ?ADLs Comments: Performs BADLs and light IADLs per daughter ? ?Functional Status:  ?Mobility: ?Bed Mobility ?Overal bed mobility: Needs Assistance ?Bed Mobility: Rolling, Sidelying to Sit ?Rolling: Min guard ?Sidelying to sit: Min assist ?Supine to sit: Min guard, Min assist ?Sit to supine: Min guard, Min assist ?General bed mobility comments: Min A for elevating trunk ?Transfers ?Overall transfer level: Needs assistance ?Equipment used: Rolling walker (2 wheels), 1 person hand held assist ?Transfers: Sit to/from Stand ?Sit to Stand: Min guard ?General transfer comment: Min guard A for safety ?Ambulation/Gait ?Ambulation/Gait assistance: Min guard ?Gait Distance (Feet): 35 Feet ?Assistive device: Rolling walker (2 wheels), 1 person hand held assist ?Gait Pattern/deviations: Step-to pattern, Step-through pattern, Decreased stride length ?General Gait Details: walked in room on RW with help to direct and manage copious lines ?Gait velocity: reduced ?Gait velocity interpretation: <1.31 ft/sec, indicative of household ambulator ?  ? ?ADL: ?ADL ?Overall ADL's : Needs assistance/impaired ?Eating/Feeding: Set up, Sitting ?Grooming: Wash/dry hands, Minimal assistance, Standing ?Grooming Details (indicate cue type and reason): Min Guard  A for balance. Min A for tactiles cues to locate and use soap. ?Upper Body Bathing: Minimal assistance, Sitting ?Lower Body Bathing: Moderate assistance, Sit to/from stand ?Upper Body Dressing : Minimal assistance, Sitting ?Lower Body Dressing: Moderate assistance ?Toilet Transfer: Minimal assistance, Stand-pivot, Rolling walker (2 wheels) (simulated to recliner) ?Functional mobility during ADLs: Minimal assistance, Cueing for sequencing ?General ADL Comments: Upon arrival, pt with bowel incontenience in bed. Requiring Mod A for peri care in standing. Pt performing functional mobility with Min A and Rw. Pt with decreased cognition and coorindation. Poor engagment of RUE ? ?Cognition: ?Cognition ?Overall Cognitive Status: Difficult to assess ?Arousal/Alertness: Awake/alert (tearful/distracted) ?Orientation Level: Oriented to person, Disoriented to place, Disoriented to time, Disoriented to situation ?Attention: Sustained ?Sustained Attention: Impaired ?Sustained Attention Impairment: Verbal basic, Functional basic ?Memory:  (DTA) ?Awareness: Impaired ?Awareness Impairment: Other (comment) (DTA, but pt tearful when attempting to communicate) ?Behaviors: Impulsive, Confabulation, Perseveration, Verbal agitation ?Cognition ?Arousal/Alertness: Awake/alert ?Behavior During Therapy: WFL for tasks assessed/performed, Anxious ?Overall Cognitive Status: Difficult to assess ?General Comments: Following simple one step commands. Poor problem solving when sequencing multistep tasks. Difficult to fully assess due to aphasia ?Difficult to assess due to: Impaired communication ? ?Physical Exam: ?Blood pressure 138/75, pulse 88, temperature 98.3 ?F (36.8 ?C), temperature source Axillary, resp. rate 14, height 5' 5" (1.651 m), weight 103.2 kg, SpO2 92 %. ?Physical Exam ?Constitutional:   ?     General: She is not in acute distress. ?   Appearance: She is obese.  ?HENT:  ?   Head: Normocephalic and atraumatic.  ?   Right Ear: External  ear normal.  ?   Left Ear: External ear normal.  ?   Nose: Nose normal.  ?Eyes:  ?   General:     ?   Right eye: No discharge.     ?   Left eye: No discharge.  ?   Extraocular Movements: Extraocular movement

## 2021-12-05 NOTE — Progress Notes (Signed)
Inpatient Rehab Admissions Coordinator:  ?  ?If Pt. Is stable for d/c in the morning, we will take her to CIR tomorrow. I will confirm in the AM.  ? ?Megan Salon, MS, CCC-SLP ?Rehab Admissions Coordinator  ?[phone] (celll) ?[phone] (office) ? ?

## 2021-12-05 NOTE — PMR Pre-admission (Signed)
PMR Admission Coordinator Pre-Admission Assessment ? ?Patient: Sarina Fout is an 76 y.o., female ?MRN: [id number] ?DOB: 1946-06-01 ?Height: 5\' 5"  (165.1 cm) ?Weight: 103.2 kg ? ?Insurance Information ?HMO:     PPO:      PCP:      IPA:      80/20:      OTHER:  ?PRIMARY: Medicare AB      Policy#: [id number]      Subscriber: Pt  ?Phone#: Verified online    Fax#:  ?Pre-Cert#:       Employer:  ?Benefits:  Phone #:      Name:  ?Eff. Date: Parts A and B effective 03/24/2011 Deduct: $1600      Out of Pocket Max:  None      Life Max: N/A  ?CIR: 100%      SNF: 100 days ?Outpatient: 80%     Co-Pay: 20% ?Home Health: 100%      Co-Pay: none ?DME: 80%     Co-Pay: 20% ?Providers: patient's choice ?SECONDARY: [employer]      Policy#: [id number]     Phone#:  ? ?Financial Counselor:       Phone#:  ? ?The ?Data Collection Information Summary? for patients in Inpatient Rehabilitation Facilities with attached ?Privacy Act [place] Records? was provided and verbally reviewed with: Family ? ?Emergency Contact Information ?Contact Information   ? ? Name Relation Home Work Mobile  ? Guerrieri-GROSS,[name] Daughter [phone]    ? Willadsen,Lisa Daughter   [phone]  ? ?  ? ? ?Current Medical History  ?Patient Admitting Diagnosis: CVA ?History of Present Illness: Brehanna Barski is a 76 y.o. female with medical history significant of previous CVAs, anxiety, T2DM, HTN, Asthma and previous marijuana abuse who was transferred from [facility]  ED for the evaluation for word salad 12/02/21. Patient went to [facility] ED for evaluation.  She was hemodynamically stable.  CBC and CMP nonrevealing.  UA negative.  UDS negative.  Alcohol level negative.  CXR showed no acute changes.  Patient was transferred to [facility] for further evaluation. CTA  of the head & neck revealed patent carotid and vertebral arteries, occlusion of L M2, severe stenosis of right M2 and left P2.2D Echo unremarkable, with  no thrombus MRI of the brain showed a  moderate-sized acute left MCA distribution infarct involving the left temporal occipital region with no associated hemorrhage or significant regional mass effect, underlying age-related cerebral atrophy with moderate chronic microvascular ischemic disease, and with multiple scattered remote lacunar infarcts involving the bilateral basal ganglia/corona radiata, left thalamus, and left ventral pons. Per stroke team, Pt. To have zio patch for 30 day cardiac monitoring and loop recorder to be placed as an outpatient.  LE dopplers negative for DVT. Pt. Was seen by PT/OT/SLP who identified new functional deficits and recommended CIR to assist return to PLOF.  ?  ?Complete NIHSS TOTAL: 4 ? ?Patient's medical record from [facility] has been reviewed by the rehabilitation admission coordinator and physician. ? ?Past Medical History  ?Past Medical History:  ?Diagnosis Date  ? Anxiety   ? Asthma   ? Diabetes mellitus without complication ([place])   ? Hypertension   ? Stroke [facility])   ? ? ?Has the patient had major surgery during 100 days prior to admission? No ? ?Family History   ?family history includes Cancer in her mother. ? ?Current Medications ? ?Current Facility-Administered Medications:  ?  acetaminophen (TYLENOL) tablet 650 mg, 650 mg, Oral, Q6H PRN **OR** acetaminophen (TYLENOL)  suppository 650 mg, 650 mg, Rectal, Q6H PRN, Nicoletta Dress, Na, MD ?  aspirin EC tablet 81 mg, 81 mg, Oral, Daily, Corky Sox, MD, 81 mg at 12/05/21 0844 ?  clonazePAM (KLONOPIN) tablet 0.5 mg, 0.5 mg, Oral, BID PRN, Nicoletta Dress, Na, MD ?  clopidogrel (PLAVIX) tablet 75 mg, 75 mg, Oral, Daily, Thurnell Lose, MD, 75 mg at 12/05/21 0844 ?  enoxaparin (LOVENOX) injection 40 mg, 40 mg, Subcutaneous, Q24H, Li, Na, MD, 40 mg at 12/04/21 2154 ?  ezetimibe (ZETIA) tablet 10 mg, 10 mg, Oral, Daily, Thurnell Lose, MD, 10 mg at 12/05/21 0844 ?  FLUoxetine (PROZAC) capsule 20 mg, 20 mg, Oral, Daily, Li, Na, MD, 20 mg at 12/05/21 0844 ?  insulin  aspart (novoLOG) injection 0-5 Units, 0-5 Units, Subcutaneous, QHS, Singh, Prashant K, MD ?  insulin aspart (novoLOG) injection 0-9 Units, 0-9 Units, Subcutaneous, TID WC, Thurnell Lose, MD, 1 Units at 12/05/21 0846 ?  MEDLINE mouth rinse, 15 mL, Mouth Rinse, BID, Li, Na, MD, 15 mL at 12/05/21 0846 ?  melatonin tablet 10 mg, 10 mg, Oral, QHS, Li, Na, MD, 10 mg at 12/04/21 2153 ?  multivitamin with minerals tablet 1 tablet, 1 tablet, Oral, Daily, Nicoletta Dress, Na, MD, 1 tablet at 12/05/21 0844 ?  niacin tablet 100 mg, 100 mg, Oral, QHS, Thurnell Lose, MD, 100 mg at 12/04/21 2154 ?  ondansetron (ZOFRAN) tablet 4 mg, 4 mg, Oral, Q6H PRN **OR** ondansetron (ZOFRAN) injection 4 mg, 4 mg, Intravenous, Q6H PRN, Nicoletta Dress, Na, MD ?  sodium chloride tablet 1 g, 1 g, Oral, TID WC, Li, Na, MD, 1 g at 12/05/21 0844 ? ?Patients Current Diet:  ?Diet Order   ? ?       ?  Diet Carb Modified Fluid consistency: Thin; Room service appropriate? Yes  Diet effective now       ?  ? ?  ?  ? ?  ? ? ?Precautions / Restrictions ?Precautions ?Precautions: Fall ?Precaution Comments: monitor HR ?Restrictions ?Weight Bearing Restrictions: No  ? ?Has the patient had 2 or more falls or a fall with injury in the past year? Yes ? ?Prior Activity Level ?Limited Community (1-2x/wk): Pt. went out for appointments ? ?Prior Functional Level ?Self Care: Did the patient need help bathing, dressing, using the toilet or eating? Independent ? ?Indoor Mobility: Did the patient need assistance with walking from room to room (with or without device)? Independent ? ?Stairs: Did the patient need assistance with internal or external stairs (with or without device)? Needed some help ? ?Functional Cognition: Did the patient need help planning regular tasks such as shopping or remembering to take medications? Dependent ? ?Patient Information ?Are you of Hispanic, Latino/a,or Spanish origin?: A. No, not of Hispanic, Latino/a, or Spanish origin ?What is your race?: A. White ?Do  you need or want an interpreter to communicate with a doctor or health care staff?: 0. No ? ?Patient's Response To:  ?Health Literacy and Transportation ?Is the patient able to respond to health literacy and transportation needs?: Yes ?Health Literacy - How often do you need to have someone help you when you read instructions, pamphlets, or other written material from your doctor or pharmacy?: Never ?In the past 12 months, has lack of transportation kept you from medical appointments or from getting medications?: No ?In the past 12 months, has lack of transportation kept you from meetings, work, or from getting things needed for daily living?: No ? ?Home Assistive Devices / Equipment ?  Home Equipment: Conservation officer, nature (2 wheels), Shower seat, Grab bars - toilet, Grab bars - tub/shower ? ?Prior Device Use: Indicate devices/aids used by the patient prior to current illness, exacerbation or injury? Walker ? ?Current Functional Level ?Cognition ? Arousal/Alertness: Awake/alert (tearful/distracted) ?Overall Cognitive Status: Difficult to assess ?Difficult to assess due to: Impaired communication ?Orientation Level: Oriented to person, Disoriented to time, Oriented to place ?General Comments: Following simple one step commands. Poor problem solving when sequencing multistep tasks. Difficult to fully assess due to aphasia ?Attention: Sustained ?Sustained Attention: Impaired ?Sustained Attention Impairment: Verbal basic, Functional basic ?Memory:  (DTA) ?Awareness: Impaired ?Awareness Impairment: Other (comment) (DTA, but pt tearful when attempting to communicate) ?Behaviors: Impulsive, Confabulation, Perseveration, Verbal agitation ?   ?Extremity Assessment ?(includes Sensation/Coordination) ? Upper Extremity Assessment: RUE deficits/detail ?RUE Deficits / Details: Decreased strength and coorindation. Difficulty performing opposition and decreased engagment of RUE into ADLs despite being dominant hand ?RUE Coordination:  decreased fine motor, decreased gross motor  ?Lower Extremity Assessment: Defer to PT evaluation ?RLE Deficits / Details: weakness of 4- to 4 strength ?RLE Coordination: decreased gross motor  ?  ?ADLs ? Overall ADL's :

## 2021-12-05 NOTE — Progress Notes (Signed)
VASCULAR LAB ? ? ? ?Bilateral lower extremity venous duplex has been performed. ? ?See CV proc for preliminary results. ? ? ?Aiken Withem, RVT ?12/05/2021, 10:35 AM ? ?

## 2021-12-05 NOTE — Progress Notes (Signed)
Inpatient Rehab Admissions Coordinator:  ? ?Per therapy recommendations,  patient was screened for CIR candidacy by Jimena Wieczorek, MS, CCC-SLP. At this time, Pt. Appears to be a a potential candidate for CIR. I will place   order for rehab consult per protocol for full assessment. Please contact me any with questions. ? ?Bishoy Cupp, MS, CCC-SLP ?Rehab Admissions Coordinator  ?[phone] (celll) ?[phone] (office) ? ?

## 2021-12-06 ENCOUNTER — Encounter (HOSPITAL_COMMUNITY): Payer: Self-pay | Admitting: Physical Medicine and Rehabilitation

## 2021-12-06 ENCOUNTER — Inpatient Hospital Stay (HOSPITAL_COMMUNITY)
Admit: 2021-12-06 | Discharge: 2021-12-12 | DRG: 057 | Disposition: A | Discharge status: Home Health | Payer: Medicare Other | Source: Intra-hospital | Attending: Physical Medicine and Rehabilitation | Admitting: Physical Medicine and Rehabilitation

## 2021-12-06 ENCOUNTER — Other Ambulatory Visit: Payer: Self-pay

## 2021-12-06 DIAGNOSIS — L299 Pruritus, unspecified: Secondary | ICD-10-CM | POA: Diagnosis not present

## 2021-12-06 DIAGNOSIS — I10 Essential (primary) hypertension: Secondary | ICD-10-CM | POA: Diagnosis present

## 2021-12-06 DIAGNOSIS — Z79890 Hormone replacement therapy (postmenopausal): Secondary | ICD-10-CM

## 2021-12-06 DIAGNOSIS — Z7984 Long term (current) use of oral hypoglycemic drugs: Secondary | ICD-10-CM

## 2021-12-06 DIAGNOSIS — Z6837 Body mass index (BMI) 37.0-37.9, adult: Secondary | ICD-10-CM | POA: Diagnosis not present

## 2021-12-06 DIAGNOSIS — Z7982 Long term (current) use of aspirin: Secondary | ICD-10-CM

## 2021-12-06 DIAGNOSIS — R2990 Unspecified symptoms and signs involving the nervous system: Secondary | ICD-10-CM | POA: Diagnosis not present

## 2021-12-06 DIAGNOSIS — J45909 Unspecified asthma, uncomplicated: Secondary | ICD-10-CM | POA: Diagnosis present

## 2021-12-06 DIAGNOSIS — G479 Sleep disorder, unspecified: Secondary | ICD-10-CM | POA: Diagnosis present

## 2021-12-06 DIAGNOSIS — E8809 Other disorders of plasma-protein metabolism, not elsewhere classified: Secondary | ICD-10-CM | POA: Diagnosis present

## 2021-12-06 DIAGNOSIS — E785 Hyperlipidemia, unspecified: Secondary | ICD-10-CM

## 2021-12-06 DIAGNOSIS — I69398 Other sequelae of cerebral infarction: Secondary | ICD-10-CM | POA: Diagnosis not present

## 2021-12-06 DIAGNOSIS — F32A Depression, unspecified: Secondary | ICD-10-CM | POA: Diagnosis present

## 2021-12-06 DIAGNOSIS — I69320 Aphasia following cerebral infarction: Secondary | ICD-10-CM | POA: Diagnosis present

## 2021-12-06 DIAGNOSIS — I69319 Unspecified symptoms and signs involving cognitive functions following cerebral infarction: Secondary | ICD-10-CM | POA: Diagnosis not present

## 2021-12-06 DIAGNOSIS — F482 Pseudobulbar affect: Secondary | ICD-10-CM | POA: Diagnosis present

## 2021-12-06 DIAGNOSIS — R4701 Aphasia: Secondary | ICD-10-CM | POA: Diagnosis not present

## 2021-12-06 DIAGNOSIS — Z79899 Other long term (current) drug therapy: Secondary | ICD-10-CM | POA: Diagnosis not present

## 2021-12-06 DIAGNOSIS — F419 Anxiety disorder, unspecified: Secondary | ICD-10-CM | POA: Diagnosis present

## 2021-12-06 DIAGNOSIS — R2689 Other abnormalities of gait and mobility: Secondary | ICD-10-CM | POA: Diagnosis present

## 2021-12-06 DIAGNOSIS — E119 Type 2 diabetes mellitus without complications: Secondary | ICD-10-CM | POA: Diagnosis present

## 2021-12-06 DIAGNOSIS — E669 Obesity, unspecified: Secondary | ICD-10-CM | POA: Diagnosis present

## 2021-12-06 DIAGNOSIS — I639 Cerebral infarction, unspecified: Principal | ICD-10-CM | POA: Diagnosis present

## 2021-12-06 DIAGNOSIS — Z8673 Personal history of transient ischemic attack (TIA), and cerebral infarction without residual deficits: Secondary | ICD-10-CM | POA: Diagnosis not present

## 2021-12-06 DIAGNOSIS — I63412 Cerebral infarction due to embolism of left middle cerebral artery: Secondary | ICD-10-CM | POA: Diagnosis not present

## 2021-12-06 LAB — GLUCOSE, CAPILLARY
Glucose-Capillary: 156 mg/dL — ABNORMAL HIGH (ref 70–99)
Glucose-Capillary: 132 mg/dL — ABNORMAL HIGH (ref 70–99)
Glucose-Capillary: 117 mg/dL — ABNORMAL HIGH (ref 70–99)
Glucose-Capillary: 153 mg/dL — ABNORMAL HIGH (ref 70–99)

## 2021-12-06 LAB — CBC WITH DIFFERENTIAL/PLATELET
Abs Immature Granulocytes: 0.04 10*3/uL (ref 0.00–0.07)
Basophils Absolute: 0.1 10*3/uL (ref 0.0–0.1)
Basophils Relative: 1 %
Eosinophils Absolute: 0.1 10*3/uL (ref 0.0–0.5)
Eosinophils Relative: 1 %
HCT: 41.6 % (ref 36.0–46.0)
Hemoglobin: 13.6 g/dL (ref 12.0–15.0)
Immature Granulocytes: 0 %
Lymphocytes Relative: 25 %
Lymphs Abs: 2.4 10*3/uL (ref 0.7–4.0)
MCH: 27.1 pg (ref 26.0–34.0)
MCHC: 32.7 g/dL (ref 30.0–36.0)
MCV: 83 fL (ref 80.0–100.0)
Monocytes Absolute: 1.3 10*3/uL — ABNORMAL HIGH (ref 0.1–1.0)
Monocytes Relative: 13 %
Neutro Abs: 5.8 10*3/uL (ref 1.7–7.7)
Neutrophils Relative %: 60 %
Platelets: 364 10*3/uL (ref 150–400)
RBC: 5.01 MIL/uL (ref 3.87–5.11)
RDW: 13.3 % (ref 11.5–15.5)
WBC: 9.8 10*3/uL (ref 4.0–10.5)
nRBC: 0 % (ref 0.0–0.2)

## 2021-12-06 LAB — COMPREHENSIVE METABOLIC PANEL
ALT: 15 U/L (ref 0–44)
AST: 12 U/L — ABNORMAL LOW (ref 15–41)
Albumin: 3.3 g/dL — ABNORMAL LOW (ref 3.5–5.0)
Alkaline Phosphatase: 85 U/L (ref 38–126)
Anion gap: 7 (ref 5–15)
BUN: 13 mg/dL (ref 8–23)
CO2: 23 mmol/L (ref 22–32)
Calcium: 9.2 mg/dL (ref 8.9–10.3)
Chloride: 104 mmol/L (ref 98–111)
Creatinine, Ser: 0.58 mg/dL (ref 0.44–1.00)
GFR, Estimated: >60 mL/min (ref >60)
Glucose, Bld: 128 mg/dL — ABNORMAL HIGH (ref 70–99)
Potassium: 3.6 mmol/L (ref 3.5–5.1)
Sodium: 134 mmol/L — ABNORMAL LOW (ref 135–145)
Total Bilirubin: 0.8 mg/dL (ref 0.3–1.2)
Total Protein: 6.9 g/dL (ref 6.5–8.1)

## 2021-12-06 LAB — MAGNESIUM: Magnesium: 2 mg/dL (ref 1.7–2.4)

## 2021-12-06 LAB — BRAIN NATRIURETIC PEPTIDE: B Natriuretic Peptide: 6.9 pg/mL (ref 0.0–100.0)

## 2021-12-06 MED ORDER — HYDROCODONE-ACETAMINOPHEN 10-325 MG PO TABS: 1.0000 | ORAL_TABLET | Freq: Four times a day (QID) | ORAL | Status: DC | PRN

## 2021-12-06 MED ORDER — ORAL CARE MOUTH RINSE
15.0000 mL | Freq: Two times a day (BID) | OROMUCOSAL | Status: DC
Start: 2021-12-06 — End: 2021-12-12
  Administered 2021-12-06 – 2021-12-12 (×12): 15 mL via OROMUCOSAL

## 2021-12-06 MED ORDER — DOCUSATE SODIUM 100 MG PO CAPS
100.0000 mg | ORAL_CAPSULE | Freq: Two times a day (BID) | ORAL | Status: DC
  Administered 2021-12-06 – 2021-12-12 (×12): 100 mg via ORAL
  Filled 2021-12-06 (×12): qty 1

## 2021-12-06 MED ORDER — ASPIRIN EC 81 MG PO TBEC
81.0000 mg | DELAYED_RELEASE_TABLET | Freq: Every day | ORAL | Status: DC
  Administered 2021-12-07 – 2021-12-12 (×6): 81 mg via ORAL
  Filled 2021-12-06 (×6): qty 1

## 2021-12-06 MED ORDER — FENOFIBRATE 54 MG PO TABS
54.0000 mg | ORAL_TABLET | Freq: Every day | ORAL | Status: DC
  Administered 2021-12-07 – 2021-12-12 (×6): 54 mg via ORAL
  Filled 2021-12-06 (×6): qty 1

## 2021-12-06 MED ORDER — FLUOXETINE HCL 20 MG PO CAPS
20.0000 mg | ORAL_CAPSULE | Freq: Every day | ORAL | Status: DC
  Administered 2021-12-07 – 2021-12-12 (×6): 20 mg via ORAL
  Filled 2021-12-06 (×6): qty 1

## 2021-12-06 MED ORDER — ONDANSETRON HCL 4 MG/2ML IJ SOLN
4.0000 mg | Freq: Four times a day (QID) | INTRAMUSCULAR | Status: DC | PRN
Start: 2021-12-06 — End: 2021-12-12

## 2021-12-06 MED ORDER — METHOCARBAMOL 500 MG PO TABS
500.0000 mg | ORAL_TABLET | Freq: Four times a day (QID) | ORAL | Status: DC | PRN
Start: 2021-12-06 — End: 2021-12-12

## 2021-12-06 MED ORDER — ADULT MULTIVITAMIN W/MINERALS CH
1.0000 | ORAL_TABLET | Freq: Every day | ORAL | Status: DC
  Administered 2021-12-07 – 2021-12-12 (×6): 1 via ORAL
  Filled 2021-12-06 (×6): qty 1

## 2021-12-06 MED ORDER — LOSARTAN POTASSIUM 100 MG PO TABS: 100.0000 mg | ORAL_TABLET | Freq: Every day | ORAL | Status: DC

## 2021-12-06 MED ORDER — BISACODYL 10 MG RE SUPP: 10.0000 mg | Freq: Every day | RECTAL | Status: DC | PRN

## 2021-12-06 MED ORDER — SODIUM CHLORIDE 1 G PO TABS
1.0000 g | ORAL_TABLET | Freq: Three times a day (TID) | ORAL | Status: DC
  Administered 2021-12-06 – 2021-12-12 (×17): 1 g via ORAL
  Filled 2021-12-06 (×17): qty 1

## 2021-12-06 MED ORDER — INSULIN ASPART 100 UNIT/ML FLEXPEN: PEN_INJECTOR | SUBCUTANEOUS | 11 refills | Status: DC

## 2021-12-06 MED ORDER — CLOPIDOGREL BISULFATE 75 MG PO TABS: 75.0000 mg | ORAL_TABLET | Freq: Every day | ORAL | Status: DC

## 2021-12-06 MED ORDER — INSULIN ASPART 100 UNIT/ML IJ SOLN: 0.0000 [IU] | Freq: Every day | INTRAMUSCULAR | Status: DC

## 2021-12-06 MED ORDER — CLOPIDOGREL BISULFATE 75 MG PO TABS
75.0000 mg | ORAL_TABLET | Freq: Every day | ORAL | Status: DC
  Administered 2021-12-07 – 2021-12-12 (×6): 75 mg via ORAL
  Filled 2021-12-06 (×6): qty 1

## 2021-12-06 MED ORDER — ONDANSETRON HCL 4 MG PO TABS: 4.0000 mg | ORAL_TABLET | Freq: Four times a day (QID) | ORAL | Status: DC | PRN

## 2021-12-06 MED ORDER — BLOOD PRESSURE CONTROL BOOK
Freq: Once | Status: AC
  Filled 2021-12-06: qty 1

## 2021-12-06 MED ORDER — ENOXAPARIN SODIUM 40 MG/0.4ML IJ SOSY
40.0000 mg | PREFILLED_SYRINGE | INTRAMUSCULAR | Status: DC
  Administered 2021-12-06 – 2021-12-08 (×3): 40 mg via SUBCUTANEOUS
  Filled 2021-12-06 (×3): qty 0.4

## 2021-12-06 MED ORDER — EZETIMIBE 10 MG PO TABS: 10.0000 mg | ORAL_TABLET | Freq: Every day | ORAL | Status: DC

## 2021-12-06 MED ORDER — LIVING WELL WITH DIABETES BOOK
Freq: Once | Status: AC
  Filled 2021-12-06: qty 1

## 2021-12-06 MED ORDER — EZETIMIBE 10 MG PO TABS
10.0000 mg | ORAL_TABLET | Freq: Every day | ORAL | Status: DC
  Administered 2021-12-07 – 2021-12-12 (×6): 10 mg via ORAL
  Filled 2021-12-06 (×6): qty 1

## 2021-12-06 MED ORDER — INSULIN ASPART 100 UNIT/ML IJ SOLN
0.0000 [IU] | Freq: Three times a day (TID) | INTRAMUSCULAR | Status: DC
  Administered 2021-12-07 – 2021-12-08 (×5): 1 [IU] via SUBCUTANEOUS

## 2021-12-06 MED ORDER — FLEET ENEMA 7-19 GM/118ML RE ENEM: 1.0000 | ENEMA | Freq: Once | RECTAL | Status: DC | PRN

## 2021-12-06 MED ORDER — MELATONIN 5 MG PO TABS
10.0000 mg | ORAL_TABLET | Freq: Every day | ORAL | Status: DC
  Administered 2021-12-06 – 2021-12-11 (×6): 10 mg via ORAL
  Filled 2021-12-06 (×6): qty 2

## 2021-12-06 MED ORDER — HYDROCODONE-ACETAMINOPHEN 5-325 MG PO TABS
1.0000 | ORAL_TABLET | ORAL | Status: DC | PRN
  Administered 2021-12-08 – 2021-12-10 (×4): 1 via ORAL
  Filled 2021-12-06 (×4): qty 1

## 2021-12-06 MED ORDER — NIACIN 100 MG PO TABS: 100.0000 mg | ORAL_TABLET | Freq: Every day | ORAL | Status: DC

## 2021-12-06 MED ORDER — ALUM & MAG HYDROXIDE-SIMETH 200-200-20 MG/5ML PO SUSP
30.0000 mL | ORAL | Status: DC | PRN
Start: 2021-12-06 — End: 2021-12-12

## 2021-12-06 MED ORDER — EXERCISE FOR HEART AND HEALTH BOOK
Freq: Once | Status: AC
  Filled 2021-12-06: qty 1

## 2021-12-06 MED ORDER — TRAZODONE HCL 50 MG PO TABS
25.0000 mg | ORAL_TABLET | Freq: Every evening | ORAL | Status: DC | PRN
  Administered 2021-12-10: 50 mg via ORAL
  Filled 2021-12-06: qty 1

## 2021-12-06 MED ORDER — NIACIN 100 MG PO TABS
100.0000 mg | ORAL_TABLET | Freq: Every day | ORAL | Status: DC
  Administered 2021-12-06: 100 mg via ORAL
  Filled 2021-12-06: qty 1

## 2021-12-06 NOTE — Progress Notes (Signed)
Inpatient Rehab Admissions Coordinator:  ? ? I have a CIR bed and Pt. Is to admit today. RN may call report to [phone]. ?       ?Megan Salon, MS, CCC-SLP ?Rehab Admissions Coordinator  ?[phone] (celll) ?[phone] (office) ? ?

## 2021-12-06 NOTE — H&P (Signed)
? ? ?Physical Medicine and Rehabilitation Admission H&P ? ?  ?Chief Complaint  ?Patient presents with  ? Altered Mental Status  ?: ?HPI: 76 year old female with past medical history of prior CVAs, anxiety, diabetes mellitus type 2, hypertension, asthma, obesity, depression, marijuana abuse presented to the hospital on 12/02/2021 with left MCA infarct.  History taken from chart review due to aphasia.  Patient went to outside hospital for evaluation of word salad.  Lab work as well as x-ray was relatively unremarkable.  She was transferred to [facility].  CT of head and neck showed occlusion of left M2, severe stenosis of right M2 and left P2.  Echocardiogram was unremarkable.  MRI of the brain showed moderate-sized acute left MCA distribution infarct involving the left temporal occipital region with no associated hemorrhage or significant regional mass effect.  There is also mention of underlying age-related cerebral atrophy with moderate chronic microvascular ischemic disease as well as multiple scattered remote lacunar infarcts involving the bilateral basal ganglia/corona radiata, left thalamus, left ventral pons.  She was seen by neurology recommended Zio patch for 30 days and loop recorder as outpatient. Neurology recommended aspirin and Plavix x3 months then Plavix alone.  Lower extremity ultrasound was ordered by neurology as well.  Hospital course further complicated by hypokalemia, which was replaced.  Patient with resulting functional deficits with mobility, transfers, cognition, endurance.  Please see preadmission assessment from earlier today as well. ? ?ROS ?Limited due to aphasia ?Past Medical History:  ?Diagnosis Date  ? Anxiety   ? Asthma   ? Diabetes mellitus without complication (HCC)   ? Hypertension   ? Stroke [facility])   ? ?Past Surgical History:  ?Procedure Laterality Date  ? arm fracture    ? arm surgery  ? CHOLECYSTECTOMY    ? ?Family History  ?Problem Relation Age of Onset  ? Cancer  Mother   ? ?Social History:  reports that she has never smoked. She has never used smokeless tobacco. She reports that she does not currently use drugs after having used the following drugs: Marijuana. She reports that she does not drink alcohol. ?Allergies:  ?Allergies  ?Allergen Reactions  ? Januvia [Sitagliptin] Other (See Comments)  ?  dizziness  ? Statins Other (See Comments)  ?  Hospitalized on 2 occasions after trying statins. Patient became dizzy and increased fall risk  ? Sulfa Antibiotics Other (See Comments)  ?  Childhood Allergy   ? ?Medications Prior to Admission  ?Medication Sig Dispense Refill  ? acetaminophen (TYLENOL) 325 MG tablet Take 650 mg by mouth every 6 (six) hours as needed for mild pain or headache.    ? amLODipine (NORVASC) 5 MG tablet TAKE 1 TABLET(5 MG) BY MOUTH DAILY (Patient taking differently: Take 5 mg by mouth daily.) 90 tablet 1  ? aspirin EC 81 MG tablet Take 1 tablet (81 mg total) by mouth daily. Swallow whole. 30 tablet 0  ? clonazePAM (KLONOPIN) 0.5 MG tablet Take 0.5 mg by mouth 2 (two) times daily as needed for anxiety.    ? FLUoxetine (PROZAC) 20 MG capsule Take one capsule by mouth once daily. (Patient taking differently: Take 20 mg by mouth daily.) 90 capsule 1  ? Melatonin 10 MG CAPS Take 10 mg by mouth at bedtime.    ? metFORMIN (GLUCOPHAGE XR) 500 MG 24 hr tablet Take two tablet by mouth daily. (Patient taking differently: Take 1,000 mg by mouth daily.) 180 tablet 1  ? Multiple Vitamin (MULTI-VITAMIN) tablet Take 1 tablet  by mouth daily. 30 tablet 0  ? sodium chloride 1 g tablet TAKE 1 TABLET(1 GRAM) BY MOUTH THREE TIMES DAILY (Patient taking differently: Take 1 g by mouth 3 (three) times daily with meals.) 90 tablet 1  ? [DISCONTINUED] losartan (COZAAR) 100 MG tablet TAKE 1 TABLET(100 MG) BY MOUTH DAILY (Patient taking differently: Take 100 mg by mouth daily.) 30 tablet 3  ? ? ?Drug Regimen Review ?Drug regimen was reviewed and remains appropriate with no significant  issues identified ? ?Home: ?Home Living ?Family/patient expects to be discharged to:: Private residence ?Living Arrangements: Children (Daughter) ?Available Help at Discharge: Family, Available PRN/intermittently ?Type of Home: House ?Home Access: Ramped entrance ?Home Layout: Two level, Full bath on main level, Able to live on main level with bedroom/bathroom ?Bathroom Shower/Tub: Walk-in shower ?Bathroom Toilet: Handicapped height ?Home Equipment: Agricultural consultantolling Walker (2 wheels), Shower seat, Grab bars - toilet, Grab bars - tub/shower ? Lives With: Daughter ?  ?Functional History: ?Prior Function ?Prior Level of Function : Independent/Modified Independent ?Mobility Comments: Uses RW per daughter ?ADLs Comments: Performs BADLs and light IADLs per daughter ? ?Functional Status:  ?Mobility: ?Bed Mobility ?Overal bed mobility: Needs Assistance ?Bed Mobility: Rolling, Sidelying to Sit ?Rolling: Min guard ?Sidelying to sit: Min assist ?Supine to sit: Min guard, Min assist ?Sit to supine: Min guard, Min assist ?General bed mobility comments: Min A for elevating trunk ?Transfers ?Overall transfer level: Needs assistance ?Equipment used: Rolling walker (2 wheels), 1 person hand held assist ?Transfers: Sit to/from Stand ?Sit to Stand: Min guard ?General transfer comment: Min guard A for safety ?Ambulation/Gait ?Ambulation/Gait assistance: Min guard ?Gait Distance (Feet): 35 Feet ?Assistive device: Rolling walker (2 wheels), 1 person hand held assist ?Gait Pattern/deviations: Step-to pattern, Step-through pattern, Decreased stride length ?General Gait Details: walked in room on RW with help to direct and manage copious lines ?Gait velocity: reduced ?Gait velocity interpretation: <1.31 ft/sec, indicative of household ambulator ?  ? ?ADL: ?ADL ?Overall ADL's : Needs assistance/impaired ?Eating/Feeding: Set up, Sitting ?Grooming: Wash/dry hands, Minimal assistance, Standing ?Grooming Details (indicate cue type and reason): Min Guard  A for balance. Min A for tactiles cues to locate and use soap. ?Upper Body Bathing: Minimal assistance, Sitting ?Lower Body Bathing: Moderate assistance, Sit to/from stand ?Upper Body Dressing : Minimal assistance, Sitting ?Lower Body Dressing: Moderate assistance ?Toilet Transfer: Minimal assistance, Stand-pivot, Rolling walker (2 wheels) (simulated to recliner) ?Functional mobility during ADLs: Minimal assistance, Cueing for sequencing ?General ADL Comments: Upon arrival, pt with bowel incontenience in bed. Requiring Mod A for peri care in standing. Pt performing functional mobility with Min A and Rw. Pt with decreased cognition and coorindation. Poor engagment of RUE ? ?Cognition: ?Cognition ?Overall Cognitive Status: Difficult to assess ?Arousal/Alertness: Awake/alert (tearful/distracted) ?Orientation Level: Oriented to person, Disoriented to place, Disoriented to time, Disoriented to situation ?Attention: Sustained ?Sustained Attention: Impaired ?Sustained Attention Impairment: Verbal basic, Functional basic ?Memory:  (DTA) ?Awareness: Impaired ?Awareness Impairment: Other (comment) (DTA, but pt tearful when attempting to communicate) ?Behaviors: Impulsive, Confabulation, Perseveration, Verbal agitation ?Cognition ?Arousal/Alertness: Awake/alert ?Behavior [facility] for tasks assessed/performed, Anxious ?Overall Cognitive Status: Difficult to assess ?General Comments: Following simple one step commands. Poor problem solving when sequencing multistep tasks. Difficult to fully assess due to aphasia ?Difficult to assess due to: Impaired communication ? ?Physical Exam: ?Blood pressure 138/75, pulse 88, temperature 98.3 ?F (36.8 ?C), temperature source Axillary, resp. rate 14, height 5\' 5"  (1.651 m), weight 103.2 kg, SpO2 92 %. ?Physical Exam ?Constitutional:   ?  General: She is not in acute distress. ?   Appearance: She is obese.  ?HENT:  ?   Head: Normocephalic and atraumatic.  ?   Right Ear: External  ear normal.  ?   Left Ear: External ear normal.  ?   Nose: Nose normal.  ?Eyes:  ?   General:     ?   Right eye: No discharge.     ?   Left eye: No discharge.  ?   Extraocular Movements: Extraocular movement

## 2021-12-06 NOTE — Discharge Summary (Signed)
?                                                                                ? Rebecca Barnes?Rebecca Barnes [id number] DOB: Apr 29, 1946 DOA: 12/02/2021 ? ?[facility] ? ?Admit date: 12/02/2021  Discharge date: 12/06/2021 ? ?Admitted From: Home   Disposition:  CIR ? ? ?Recommendations for Outpatient Follow-up:  ? ?Follow up with PCP in 1-2 weeks ? ?PCP Please obtain BMP/CBC, 2 view CXR in 1week,  (see Discharge instructions)  ? ?PCP Please follow up on the following pending results:  ? ? ?Home Health: None   ?Equipment/Devices: None  ?Consultations: None  ?Discharge Condition: Stable    ?CODE STATUS: Full    ?Diet Recommendation: Heart Healthy  ? ?Diet Order   ? ?       ?  Diet Carb Modified Fluid consistency: Thin; Room service appropriate? Yes  Diet effective now       ?  ? ?  ?  ? ?  ?  ? ?Chief Complaint  ?Patient presents with  ? Altered Mental Status  ?  ? ?Brief history of present illness from the day of admission and additional interim summary   ? ?[age] year old Caucasian female who lives at home with previous known medical history of diabetes mellitus type 2, hypertension, previous history of stroke who presented with 48-hour duration history of difficulty to speak.  Work-up consistent with MRI with large left MCA territory ischemic infarct and she was admitted for further work-up.  ? ?                                                               Hospital Course  ? ? Acute left MCA distribution infarct involving the left temporal occipital region - she was seen neurology team currently on Aspirin + Plavix (dual antiplatelet for 3 mths then Plavix alone) , LDL is above goal and A1c is borderline.  She will be started on Zetia and niacin combination due to statin allergy, she may benefit from outpatient PCSK9 infusions which I defer to PCP to arrange.  Unremarkable stroke work-up otherwise including nonacute  echocardiogram and lower extremity venous duplex, case discussed with neurology team Dr. Roda ShuttersXu, no TEE needed.  [facility] or 30-day event monitor.  Needs outpatient PCP and neurology follow-up postdischarge. ?  ?2.  Dyslipidemia.  Zetia and niacin combination.  Stated statin allergy. ?  ?3.  Obesity with BMI of 37.  Follow with PCP for weight loss. ?  ?4.  Hypokalemia.  Replaced. ?  ?5.  Depression.  Home medications. ?  ?6.  Hypertension.  Allow for permissive hypertension, resuming Norvasc today and ARB from tomorrow. ?  ?7.  DM type II.  Borderline A1c.  Sliding scale for now.  Glucophage at home dose.  Check CBGs q. ACH S at CIR. ? ? ? ? ?Discharge diagnosis   ? ? ?Principal Problem: ?  Stroke-like symptoms ?Active Problems: ?  Type 2 diabetes mellitus without complication, without long-term current use of insulin (HCC) ?  Essential hypertension ?  History of CVA (cerebrovascular accident) ?  Stroke [facility]) ?  Expressive aphasia ?  Obesity (BMI 30-39.9) ?  History of marijuana use ? ? ? ?Discharge instructions   ? ?Discharge Instructions   ? ? Ambulatory referral to Neurology   Complete by: As directed ?  ? Follow up with stroke clinic NP (Jessica [place] or Darrol Angel, if both not available, consider Manson Allan, or Ahern) at [facility] in about 4 weeks. Thanks.  ? Discharge instructions   Complete by: As directed ?  ? Follow with Primary MD [facility] in 7 days  ? ?Get CBC, CMP, 2 view Chest X ray -  checked next visit within 1 week by CIR MD   ? ?Activity: As tolerated with Full fall precautions use walker/cane & assistance as needed ? ?Disposition CIR ? ?Diet: Heart Healthy Carb, QAC- HS CBGs ? ?Special Instructions: If you have smoked or chewed Tobacco  in the last 2 yrs please stop smoking, stop any regular Alcohol  and or any Recreational drug use. ? ?On your next visit with your primary care physician please Get Medicines reviewed and  adjusted. ? ?Please request your Prim.MD to go over all Hospital Tests and Procedure/Radiological results at the follow up, please get all Hospital records sent to your Prim MD by signing hospital release before you go home. ? ?If you experience worsening of your admission symptoms, develop shortness of breath, life threatening emergency, suicidal or homicidal thoughts you must seek medical attention immediately by calling 911 or calling your MD immediately  if symptoms less severe. ? ?You Must read complete instructions/literature along with all the possible adverse reactions/side effects for all the Medicines you take and that have been prescribed to you. Take any new Medicines after you have completely understood and accpet all the possible adverse reactions/side effects.  ? Increase activity slowly   Complete by: As directed ?  ? ?  ? ? ?Discharge Medications  ? ?Allergies as of 12/06/2021   ? ?   Reactions  ? Januvia [sitagliptin] Other (See Comments)  ? dizziness  ? Statins Other (See Comments)  ? Hospitalized on 2 occasions after trying statins. Patient became dizzy and increased fall risk  ? Sulfa Antibiotics Other (See Comments)  ? Childhood Allergy   ? ?  ? ?  ?Medication List  ?  ? ?TAKE these medications   ? ?acetaminophen 325 MG tablet ?Commonly known as: TYLENOL ?Take 650 mg by mouth every 6 (six) hours as needed for mild pain or headache. ?  ?amLODipine 5 MG tablet ?Commonly known as: NORVASC ?TAKE 1 TABLET(5 MG) BY MOUTH DAILY ?What changed: See the new instructions. ?  ?aspirin EC 81 MG tablet ?Take 1 tablet (81 mg total) by mouth daily. Swallow whole. ?  ?clonazePAM 0.5 MG tablet ?Commonly known as: KLONOPIN ?Take 0.5 mg by mouth 2 (two) times daily as needed for anxiety. ?  ?clopidogrel 75 MG tablet ?Commonly known as: PLAVIX ?Take 1 tablet (75 mg total) by mouth daily. ?  ?ezetimibe 10 MG tablet ?Commonly known as: ZETIA ?Take 1 tablet (10 mg total) by mouth daily. ?  ?FLUoxetine 20 MG  capsule ?Commonly known as: PROZAC ?Take one capsule by mouth once daily. ?What changed:  ?how much to take ?how to take this ?when to take this ?additional instructions ?  ?  insulin aspart 100 UNIT/ML FlexPen ?Commonly known as: NOVOLOG ?Before each meal 3 times a day, 140-199 - 2 units, 200-250 - 4 units, 251-299 - 6 units,  300-349 - 8 units,  350 or above 10 units.  Insulin PEN if approved, provide syringes and needles if needed. ?  ?losartan 100 MG tablet ?Commonly known as: COZAAR ?Take 1 tablet (100 mg total) by mouth daily. ?Start taking on: December 07, 2021 ?What changed: See the new instructions. ?  ?Melatonin 10 MG Caps ?Take 10 mg by mouth at bedtime. ?  ?metFORMIN 500 MG 24 hr tablet ?Commonly known as: Glucophage XR ?Take two tablet by mouth daily. ?What changed:  ?how much to take ?how to take this ?when to take this ?additional instructions ?  ?Multi-Vitamin tablet ?Take 1 tablet by mouth daily. ?  ?niacin 100 MG tablet ?Take 1 tablet (100 mg total) by mouth at bedtime. ?  ?sodium chloride 1 g tablet ?TAKE 1 TABLET(1 GRAM) BY MOUTH THREE TIMES DAILY ?What changed: See the new instructions. ?  ? ?  ? ? ? Follow-up Information   ? ? [facility]. Schedule an appointment as soon as possible for a visit in 1 month(s).   ?Specialty: Neurology ?Why: stroke clinic ?Contact information: ?[address] ?[place] [address] ?[phone] ? ?  ?  ? ? [facility]. Schedule an appointment as soon as possible for a visit in 1 week(s).   ?Contact information: ?[address] ?[place] [address] ?[phone] ? ? ?  ?  ? ?  ?  ? ?  ? ? ?Major procedures and Radiology Reports - PLEASE review detailed and final reports thoroughly  -    ? ?  ?CT ANGIO HEAD W OR WO CONTRAST ? ?Result Date: 12/04/2021 ?CLINICAL DATA:  Stroke, follow-up. EXAM: CT ANGIOGRAPHY HEAD AND NECK TECHNIQUE: Multidetector CT imaging of the head and neck was performed using the standard  protocol during bolus administration of intravenous contrast. Multiplanar CT image reconstructions and MIPs were obtained to evaluate the vascular anatomy. Carotid stenosis measurements (when applicable) are obtained [employer]

## 2021-12-06 NOTE — Progress Notes (Addendum)
Inpatient Rehabilitation Admission Medication Review by a Pharmacist ? ?A complete drug regimen review was completed for this patient to identify any potential clinically significant medication issues. ? ?High Risk Drug Classes Is patient taking? Indication by Medication  ?Antipsychotic No   ?Anticoagulant Yes Enoxaparin for DVT prophylaxis   ?Antibiotic No   ?Opioid Yes Norco PRN acute pain   ?Antiplatelet Yes Aspirin and clopidogrel for stroke prophylaxis   ?Hypoglycemics/insulin Yes Insulin PRN for hyperglycemia  ?Vasoactive Medication No   ?Chemotherapy No   ?Other Yes Fluoxetine for depression ?Trazodone for sleep ?Methocarbamol for muscle spasms   ? ? ? ?Type of Medication Issue Identified Description of Issue Recommendation(s)  ?Drug Interaction(s) (clinically significant) ?    ?Duplicate Therapy ?    ?Allergy ?    ?No Medication Administration End Date ? Aspirin to stop in 3 months (02/19/22) Aspirin to stop in 3 months (02/19/22)  ?Incorrect Dose ?    ?Additional Drug Therapy Needed ? Restart home metformin XR  Restart home metformin XR and decrease insulin as able   ?Significant med changes from prior encounter (inform family/care partners about these prior to discharge). New ezetimibe, niacin, and clopidogrel, multivitamin  ?Stop amlodipine, losartan, clonazepam Educate at discharge  ?Other ? Niacin was started during last admission due to statin intolerance Consider switching niacin to fenofibrate better side effect profile -- provider agreed   ? ? ?Clinically significant medication issues were identified that warrant physician communication and completion of prescribed/recommended actions by midnight of the next day:  No ? ?Name of provider notified for urgent issues identified:  ? ?Provider Method of Notification:  ? ? ?  ? ?Time spent performing this drug regimen review (minutes):  15  ? ?Alphia Moh, PharmD, BCPS, BCCP ?Clinical Pharmacist ? ?Please check AMION for all [facility] phone numbers ?After  10:00 PM, call Main Pharmacy [phone] ? ?

## 2021-12-06 NOTE — Plan of Care (Signed)
?  Problem: Education: ?Goal: Knowledge of General Education information will improve ?Description: Including pain rating scale, medication(s)/side effects and non-pharmacologic comfort measures ?Outcome: Progressing ?  ?Problem: Health Behavior/Discharge Planning: ?Goal: Ability to manage health-related needs will improve ?Outcome: Progressing ?  ?Problem: Clinical Measurements: ?Goal: Ability to maintain clinical measurements within normal limits will improve ?Outcome: Progressing ?Goal: Will remain free from infection ?Outcome: Progressing ?Goal: Diagnostic test results will improve ?Outcome: Progressing ?Goal: Respiratory complications will improve ?Outcome: Progressing ?Goal: Cardiovascular complication will be avoided ?Outcome: Progressing ?  ?Problem: Activity: ?Goal: Risk for activity intolerance will decrease ?Outcome: Progressing ?  ?Problem: Nutrition: ?Goal: Adequate nutrition will be maintained ?Outcome: Progressing ?  ?Problem: Coping: ?Goal: Level of anxiety will decrease ?Outcome: Progressing ?  ?Problem: Elimination: ?Goal: Will not experience complications related to bowel motility ?Outcome: Progressing ?Goal: Will not experience complications related to urinary retention ?Outcome: Progressing ?  ?Problem: Pain Managment: ?Goal: General experience of comfort will improve ?Outcome: Progressing ?  ?Problem: Safety: ?Goal: Ability to remain free from injury will improve ?Outcome: Progressing ?  ?Problem: Skin Integrity: ?Goal: Risk for impaired skin integrity will decrease ?Outcome: Progressing ?  ?Problem: Education: ?Goal: Knowledge of disease or condition will improve ?Outcome: Progressing ?Goal: Knowledge of secondary prevention will improve (SELECT ALL) ?Outcome: Progressing ?  ?Problem: Nutrition: ?Goal: Risk of aspiration will decrease ?Outcome: Progressing ?  ?

## 2021-12-06 NOTE — Progress Notes (Signed)
Patient admitted to unit. Patient arrived to unit crying and confused. Unable to go over orientation to unit. Spoke with daughter over the phone and discussed orientation to rehab unit. Also expressed to daughter that patient had a bag of medication in her belongs and that they will be stored with pharmacy until discharge. Daughter stated that she understood and usually visits in the mornings. Home medications counted and sent down to pharmacy. ?Cletis Media, LPN ? ?

## 2021-12-06 NOTE — Discharge Instructions (Signed)
Follow with Primary MD [facility] in 7 days  ? ?Get CBC, CMP, 2 view Chest X ray -  checked next visit within 1 week by CIR MD   ? ?Activity: As tolerated with Full fall precautions use walker/cane & assistance as needed ? ?Disposition CIR ? ?Diet: Heart Healthy Carb, QAC- HS CBGs ? ?Special Instructions: If you have smoked or chewed Tobacco  in the last 2 yrs please stop smoking, stop any regular Alcohol  and or any Recreational drug use. ? ?On your next visit with your primary care physician please Get Medicines reviewed and adjusted. ? ?Please request your Prim.MD to go over all Hospital Tests and Procedure/Radiological results at the follow up, please get all Hospital records sent to your Prim MD by signing hospital release before you go home. ? ?If you experience worsening of your admission symptoms, develop shortness of breath, life threatening emergency, suicidal or homicidal thoughts you must seek medical attention immediately by calling 911 or calling your MD immediately  if symptoms less severe. ? ?You Must read complete instructions/literature along with all the possible adverse reactions/side effects for all the Medicines you take and that have been prescribed to you. Take any new Medicines after you have completely understood and accpet all the possible adverse reactions/side effects.  ? ?  ?

## 2021-12-07 DIAGNOSIS — I639 Cerebral infarction, unspecified: Secondary | ICD-10-CM | POA: Diagnosis not present

## 2021-12-07 LAB — GLUCOSE, CAPILLARY
Glucose-Capillary: 128 mg/dL — ABNORMAL HIGH (ref 70–99)
Glucose-Capillary: 127 mg/dL — ABNORMAL HIGH (ref 70–99)
Glucose-Capillary: 124 mg/dL — ABNORMAL HIGH (ref 70–99)
Glucose-Capillary: 112 mg/dL — ABNORMAL HIGH (ref 70–99)

## 2021-12-07 MED ORDER — CAMPHOR-MENTHOL 0.5-0.5 % EX LOTN
TOPICAL_LOTION | CUTANEOUS | Status: DC | PRN
  Filled 2021-12-07: qty 222

## 2021-12-07 NOTE — Discharge Summary (Signed)
Physician Discharge Summary  ?Patient ID: ?Rebecca Barnes ?MRN: [id number] ?DOB/AGE: 76/22/1947 76 y.o. ? ?Admit date: 12/06/2021 ?Discharge date: 12/12/2021 ? ?Discharge Diagnoses:  ?Principal Problem: ?  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 ?Discharged Condition: good ? ?Significant Diagnostic Studies: ?CT ANGIO HEAD W OR WO CONTRAST ? ?Result Date: 12/04/2021 ?CLINICAL DATA:  Stroke, follow-up. EXAM: CT ANGIOGRAPHY HEAD AND NECK TECHNIQUE: Multidetector CT imaging of the head and neck was performed using the standard protocol during bolus administration of intravenous contrast. Multiplanar CT image reconstructions and MIPs were obtained to evaluate the vascular anatomy. Carotid stenosis measurements (when applicable) are obtained utilizing NASCET criteria, using the distal internal carotid diameter as the denominator. RADIATION DOSE REDUCTION: This exam was performed according to the departmental dose-optimization program which includes automated exposure control, adjustment of the mA and/or kV according to patient size and/or use of iterative reconstruction technique. CONTRAST:  OMNIPAQUE IOHEXOL 350 MG/ML SOLN COMPARISON:  Brain MRI 12/03/2021. Prior head CT examinations 12/02/2021 and earlier. FINDINGS: CT HEAD FINDINGS Brain: Mild generalized cerebral atrophy. Known evolving sizable acute cortical/subcortical infarct within the left temporal, parietal and occipital lobes (within the left MCA vascular territory and left MCA/PCA watershed territory). The infarct has not significantly progressed in extent as compared to yesterday's brain MRI. Mild local mass effect without ventricular effacement or midline shift. No evidence of hemorrhagic conversion. Chronic lacunar infarcts within the  left corona radiata, bilateral basal ganglia, left thalamus and within the ventral pons, some of which were better appreciated on the prior brain MRI of 12/03/2021. Background moderate patchy and ill-defined hypoattenuation within the cerebral white matter, nonspecific but compatible with chronic small vessel ischemic disease. No extra-axial fluid collection. No evidence of an intracranial mass. Vascular: No hyperdense vessel. Skull: Normal. Negative for fracture or focal lesion. Sinuses: No significant paranasal sinus disease. Orbits: No orbital mass or acute orbital finding. Review of the MIP images confirms the above findings CTA NECK FINDINGS Aortic arch: The left vertebral artery arises directly from the aortic arch. Atherosclerotic plaque within the visualized aortic arch and proximal major branch vessels of the neck. No hemodynamically significant innominate or proximal subclavian artery stenosis. Right carotid system: CCA and ICA patent within the neck without hemodynamically significant stenosis (50% or greater). Mild calcified plaque about the carotid bifurcation. Left carotid system: CCA and ICA patent within the neck without stenosis. Minimal atherosclerotic plaque about the carotid bifurcation. Vertebral arteries: The vertebral arteries are patent within the neck without stenosis or significant atherosclerotic disease. The right vertebral artery is dominant. Skeleton: Straightening of the expected cervical lordosis. Partially imaged thoracic levocurvature. Cervical spondylosis. No acute bony abnormality or aggressive osseous lesion. Other neck: Multiple thyroid nodules measuring up to 14 mm, not meeting consensus criteria for ultrasound follow-up based on size. Upper chest: No consolidation within the imaged lung apices. Review of the MIP images confirms the above findings CTA HEAD FINDINGS Anterior circulation: The intracranial internal carotid arteries are patent. Calcified plaque within both vessels  with no more than mild stenosis. The M1 middle cerebral arteries are patent. An inferior division proximal M2 left MCA vessel becomes occluded shortly beyond its origin (series 14, image 20) (series 16, image 28). Atherosclerotic irregularity of the M2 and more distal MCA vessels, bilaterally. Most notably, there is a severe focal stenosis within a superior division mid M2 right MCA vessel (  series 16, image 12), a severe stenosis within a distal M2 right MCA vessel (series 16, image 12) and a site of severe stenosis within a superior division proximal to mid M2 left MCA vessel (series 16, image 28). The anterior cerebral arteries are patent. No intracranial aneurysm is identified. Posterior circulation: The intracranial vertebral arteries are patent. The non dominant left vertebral artery is developmentally diminutive beyond the origin of the left PICA. The basilar artery is patent. The posterior cerebral arteries are patent. Atherosclerotic irregularity of both vessels. Most notably, there is a severe stenosis within the P2 left PCA (series 14, image 20) and a moderate stenosis within the P2 right PCA. Probable small right posterior communicating artery. The left posterior communicating artery is diminutive or absent. Venous sinuses: Within the limitations of contrast timing, no convincing thrombus. Anatomic variants: As described. Review of the MIP images confirms the above findings CTA head impression #1 will be called to the ordering clinician or representative by the Radiologist Assistant, and communication documented in the PACS or [employer]. IMPRESSION: CT head: 1. Known sizable evolving acute cortical/subcortical infarct within the left temporal, parietal and occipital lobes (left MCA territory and left MCA/PCA watershed territory). The infarct has not significantly progressed in extent from yesterday's brain MRI. Mild local mass effect without ventricular effacement or midline shift. No evidence of  hemorrhagic conversion. 2. Stable background chronic small vessel ischemic disease with multiple chronic lacunar infarcts, as described. 3. Mild generalized parenchymal atrophy. CTA neck: 1. The common carotid, internal carotid and vertebral arteries are patent within the neck without hemodynamically significant stenosis. Mild atherosclerotic disease within the bilateral carotid systems within the neck, as described. 2.  Aortic Atherosclerosis (ICD10-I70.0). 3. Cervical spondylosis. CTA head: 1. An inferior division proximal M2 left MCA vessel becomes occluded shortly beyond its origin. 2. Intracranial atherosclerotic disease with multifocal stenoses, most notably as follows. 3. Severe focal stenosis within a superior division mid M2 right MCA vessel. 4. Severe focal stenosis within a distal M2 right MCA vessel. 5. Severe stenosis within a superior division proximal to mid M2 left MCA vessel. 6. Severe stenosis within the P2 left PCA. 7. Moderate stenosis within the P2 right PCA. Electronically Signed   By: Jackey Loge D.O.   On: 12/04/2021 11:38  ? ?CT HEAD WO CONTRAST ? ?Result Date: 12/02/2021 ?CLINICAL DATA:  Altered mental status. EXAM: CT HEAD WITHOUT CONTRAST TECHNIQUE: Contiguous axial images were obtained from the base of the skull through the vertex without intravenous contrast. RADIATION DOSE REDUCTION: This exam was performed according to the departmental dose-optimization program which includes automated exposure control, adjustment of the mA and/or kV according to patient size and/or use of iterative reconstruction technique. COMPARISON:  March 14, 2021 FINDINGS: Brain: There is mild to moderate severity cerebral atrophy with widening of the extra-axial spaces and ventricular dilatation. There are areas of decreased attenuation within the white matter tracts of the supratentorial brain, consistent with microvascular disease changes. Vascular: No hyperdense vessel or unexpected calcification. Skull:  Normal. Negative for fracture or focal lesion. Sinuses/Orbits: No acute finding. Other: None. IMPRESSION: 1. No acute intracranial abnormality. 2. Generalized cerebral atrophy and microvascular disease chang

## 2021-12-07 NOTE — Plan of Care (Signed)
?  Problem: RH Comprehension Communication ?Goal: LTG Patient will comprehend basic/complex auditory (SLP) ?Description: LTG: Patient will comprehend basic/complex auditory information with cues (SLP). ?Flowsheets (Taken 12/07/2021 2041) ?LTG: Patient will comprehend: Basic auditory information ?LTG: Patient will comprehend auditory information with cueing (SLP): Moderate Assistance - Patient 50 - 74% ?  ?Problem: RH Expression Communication ?Goal: LTG Patient will express needs/wants via multi-modal(SLP) ?Description: LTG:  Patient will express needs/wants via multi-modal communication (gestures/written, etc) with cues (SLP) ?Flowsheets (Taken 12/07/2021 2041) ?LTG: Patient will express needs/wants via multimodal communication (gestures/written, etc) with cueing (SLP): Moderate Assistance - Patient 50 - 74% ?Goal: LTG Patient will verbally express basic/complex needs(SLP) ?Description: LTG:  Patient will verbally express basic/complex needs, wants or ideas with cues  (SLP) ?Flowsheets (Taken 12/07/2021 2041) ?LTG: Patient will verbally express basic/complex needs, wants or ideas (SLP): Maximal Assistance - Patient 25 - 49% ?  ?

## 2021-12-07 NOTE — Progress Notes (Signed)
?                                                       PROGRESS NOTE ? ? ?Subjective/Complaints: ?Mrs. Sheehan has a rash on her back- I have asked Tomeka to get her hypoallergenic sheets and have ordered sarna lotion for her ?Discontinue IV ? ?ROS: +pseudobulbar affect as per Dois DavenportSandra OT ? ? ?Objective: ?  ?VAS US LOWER EXTREMITY VENOUS (DVT) ? ?Result Date: 12/05/2021 ? Lower Venous DVT Study Patient Name:  Rebecca RavelMARYANN Barnes  Date of Exam:   12/05/2021 Medical Rec #: [id number]       Accession #:    [id number] Date of Birth: 1945/10/15       Patient Gender: F Patient Age:   [age] years [facility] Procedure:      VAS US LOWER EXTREMITY VENOUS (DVT) Referring Phys: Scheryl MartenJINDONG XU --------------------------------------------------------------------------------  Indications: Stroke.  Comparison Study: No prior study Performing Technologist: Sherren Kernsandace Kanady RVS  Examination Guidelines: A complete evaluation includes B-mode imaging, spectral Doppler, color Doppler, and power Doppler as needed of all accessible portions of each vessel. Bilateral testing is considered an integral part of a complete examination. Limited examinations for reoccurring indications may be performed as noted. The reflux portion of the exam is performed with the patient in reverse Trendelenburg.  +---------+---------------+---------+-----------+----------+--------------+ RIGHT    CompressibilityPhasicitySpontaneityPropertiesThrombus Aging +---------+---------------+---------+-----------+----------+--------------+ CFV      Full           Yes      Yes                                 +---------+---------------+---------+-----------+----------+--------------+ SFJ      Full                                                        +---------+---------------+---------+-----------+----------+--------------+ FV Prox  Full                                                         +---------+---------------+---------+-----------+----------+--------------+ FV Mid   Full                                                        +---------+---------------+---------+-----------+----------+--------------+ FV DistalFull                                                        +---------+---------------+---------+-----------+----------+--------------+ PFV      Full                                                        +---------+---------------+---------+-----------+----------+--------------+  POP      Full           Yes      Yes                                 +---------+---------------+---------+-----------+----------+--------------+ PTV      Full                                                        +---------+---------------+---------+-----------+----------+--------------+ PERO     Full                                                        +---------+---------------+---------+-----------+----------+--------------+   +---------+---------------+---------+-----------+----------+--------------+ LEFT     CompressibilityPhasicitySpontaneityPropertiesThrombus Aging +---------+---------------+---------+-----------+----------+--------------+ CFV      Full           Yes      Yes                                 +---------+---------------+---------+-----------+----------+--------------+ SFJ      Full                                                        +---------+---------------+---------+-----------+----------+--------------+ FV Prox  Full                                                        +---------+---------------+---------+-----------+----------+--------------+ FV Mid   Full                                                        +---------+---------------+---------+-----------+----------+--------------+ FV DistalFull                                                         +---------+---------------+---------+-----------+----------+--------------+ PFV      Full                                                        +---------+---------------+---------+-----------+----------+--------------+ POP      Full           Yes      Yes                                 +---------+---------------+---------+-----------+----------+--------------+  PTV      Full                                                        +---------+---------------+---------+-----------+----------+--------------+ PERO     Full                                                        +---------+---------------+---------+-----------+----------+--------------+     Summary: BILATERAL: - No evidence of deep vein thrombosis seen in the lower extremities, bilaterally. -   *See table(s) above for measurements and observations. Electronically signed by Waverly Ferrari MD on 12/05/2021 at 5:15:27 PM.    Final    ?Recent Labs  ?  12/05/21 ?4970 12/06/21 ?0115  ?WBC 9.3 9.8  ?HGB 13.5 13.6  ?HCT 41.1 41.6  ?PLT 382 364  ? ?Recent Labs  ?  12/05/21 ?2637 12/06/21 ?0115  ?NA 136 134*  ?K 3.3* 3.6  ?CL 105 104  ?CO2 23 23  ?GLUCOSE 130* 128*  ?BUN 13 13  ?CREATININE 0.53 0.58  ?CALCIUM 9.3 9.2  ? ? ?Intake/Output Summary (Last 24 hours) at 12/07/2021 1033 ?Last data filed at 12/07/2021 0700 ?Gross per 24 hour  ?Intake 437 ml  ?Output --  ?Net 437 ml  ?  ? ?  ? ?Physical Exam: ?Vital Signs ?Blood pressure (!) 124/59, pulse 80, temperature 98.4 ?F (36.9 ?C), resp. rate 16, height 5\' 5"  (1.651 m), weight 103.2 kg, SpO2 99 %. ?Gen: no distress, normal appearing ?HEENT: oral mucosa pink and moist, NCAT ?Cardio: Reg rate ?Chest: normal effort, normal rate of breathing ?Abd: soft, non-distended ?Ext: no edema ?Musculoskeletal:  ?   Cervical back: Normal range of motion.  ?   Comments: No edema or tenderness in extremities  ?Skin: ?   General: Skin is warm and dry.  ?Neurological:  ?   Mental Status: She is alert.  ?    Comments: Alert ?Motor: RUE/RLE: 4 --4/5 proximal distal ?LUE/LE: 4+/5 proximal to distal ?Expressive aphasia >receptive aphasia  ?Psychiatric:  ?   Comments: Limited due to aphasia, +pseudobulbar affect ? ? ?Assessment/Plan: ?1. Functional deficits which require 3+ hours per day of interdisciplinary therapy in a comprehensive inpatient rehab setting. ?Physiatrist is providing close team supervision and 24 hour management of active medical problems listed below. ?Physiatrist and rehab team continue to assess barriers to discharge/monitor patient progress toward functional and medical goals ? ?Care Tool: ? ?Bathing ?   ?   ?   ?  ?  ?Bathing assist   ?  ?  ?Upper Body Dressing/Undressing ?Upper body dressing   ?What is the patient wearing?: Hospital gown only ?   ?Upper body assist   ?   ?Lower Body Dressing/Undressing ?Lower body dressing ? ? ?   ?What is the patient wearing?: Incontinence brief ? ?  ? ?Lower body assist   ?   ? ?Toileting ?Toileting    ?Toileting assist Assist for toileting: Moderate Assistance - Patient 50 - 74% ?  ?  ?Transfers ?Chair/bed transfer ? ?Transfers assist ?   ? ?  ?  ?  ?Locomotion ?Ambulation ? ? ?Ambulation assist ? ?   ? ?  ?  ?   ? ?  Walk 10 feet activity ? ? ?Assist ?   ? ?  ?   ? ?Walk 50 feet activity ? ? ?Assist   ? ?  ?   ? ? ?Walk 150 feet activity ? ? ?Assist   ? ?  ?  ?  ? ?Walk 10 feet on uneven surface  ?activity ? ? ?Assist   ? ? ?  ?   ? ?Wheelchair ? ? ? ? ?Assist   ?  ?  ? ?  ?   ? ? ?Wheelchair 50 feet with 2 turns activity ? ? ? ?Assist ? ?  ?  ? ? ?   ? ?Wheelchair 150 feet activity  ? ? ? ?Assist ?   ? ? ?   ? ?Blood pressure (!) 124/59, pulse 80, temperature 98.4 ?F (36.9 ?C), resp. rate 16, height 5\' 5"  (1.651 m), weight 103.2 kg, SpO2 99 %. ? ? ? Medical Problem List and Plan: ?1. Functional deficits with mobility, transfers, cognition, endurance secondary to acute left MCA distribution infarct . ?            -patient may not shower ?            -ELOS/Goals:  6-9 days/supervision ?            Continue CIR ?2.  DVT prophylaxis/anticoagulation:   ?-Mechanical: Sequential compression devices, below knee Bilateral lower extremities ?Pharmaceutical: Lovenox:  ?            -antiplatelet therapy: Aspirin and Plavix x3 months, then Plavix alone ?3. Pain Management: As needed medications ?4.  Mood: Team support ?            -antipsychotic agents:N/A. ?5. Neuropsych: This patient is not capable

## 2021-12-07 NOTE — Progress Notes (Signed)
Inpatient Rehabilitation  Patient information reviewed and entered into eRehab system by Clarissa Laird M. Chole Driver, M.A., CCC/SLP, PPS Coordinator.  Information including medical coding, functional ability and quality indicators will be reviewed and updated through discharge.    

## 2021-12-07 NOTE — Plan of Care (Signed)
?  Problem: Consults ?Goal: RH STROKE PATIENT EDUCATION ?Description: See Patient Education module for education specifics  ?Outcome: Progressing ?  ?Problem: RH BOWEL ELIMINATION ?Goal: RH STG MANAGE BOWEL WITH ASSISTANCE ?Description: STG Manage Bowel with mod I Assistance. ?Outcome: Progressing ?Goal: RH STG MANAGE BOWEL W/MEDICATION W/ASSISTANCE ?Description: STG Manage Bowel with Medication with mod I Assistance. ?Outcome: Progressing ?  ?Problem: RH BLADDER ELIMINATION ?Goal: RH STG MANAGE BLADDER WITH ASSISTANCE ?Description: STG Manage Bladder With toileting protocol Assistance ?Outcome: Progressing ?  ?Problem: RH SAFETY ?Goal: RH STG ADHERE TO SAFETY PRECAUTIONS W/ASSISTANCE/DEVICE ?Description: STG Adhere to Safety Precautions With cues Assistance/Device. ?Outcome: Progressing ?  ?Problem: RH PAIN MANAGEMENT ?Goal: RH STG PAIN MANAGED AT OR BELOW PT'S PAIN GOAL ?Description: At or below level 4 with prns ?Outcome: Progressing ?  ?Problem: RH KNOWLEDGE DEFICIT ?Goal: RH STG INCREASE KNOWLEDGE OF DIABETES ?Description: Patient and daughter will be able to manage DM medications and dietary modifications using handouts and educational materials independently ?Outcome: Progressing ?Goal: RH STG INCREASE KNOWLEDGE OF HYPERTENSION ?Description: Patient and daughter will be able to manage HTN, medications and dietary modifications using handouts and educational materials independently ?Outcome: Progressing ?Goal: RH STG INCREASE KNOWLEGDE OF HYPERLIPIDEMIA ?Description: Patient and daughter will be able to manage HLD, medications and dietary modifications using handouts and educational materials independently ?Outcome: Progressing ?Goal: RH STG INCREASE KNOWLEDGE OF STROKE PROPHYLAXIS ?Description: Patient and daughter will be able to manage secondary stroke risks with medications and dietary modifications using handouts and educational materials independently ?Outcome: Progressing ?  ?

## 2021-12-07 NOTE — Progress Notes (Signed)
PMR Admission Coordinator Pre-Admission Assessment ?  ?Patient: Rebecca Barnes is an 76 y.o., female ?MRN: [id number] ?DOB: September 01, 1945 ?Height: 5\' 5"  (165.1 cm) ?Weight: 103.2 kg ?  ?Insurance Information ?HMO:     PPO:      PCP:      IPA:      80/20:      OTHER:  ?PRIMARY: Medicare AB      Policy#: [id number]      Subscriber: Pt  ?Phone#: Verified online    Fax#:  ?Pre-Cert#:       Employer:  ?Benefits:  Phone #:      Name:  ?Eff. Date: Parts A and B effective 03/24/2011 Deduct: $1600      Out of Pocket Max:  None      Life Max: N/A  ?CIR: 100%      SNF: 100 days ?Outpatient: 80%     Co-Pay: 20% ?Home Health: 100%      Co-Pay: none ?DME: 80%     Co-Pay: 20% ?Providers: patient's choice ?SECONDARY: [employer]      Policy#: [id number]     Phone#:  ?  ?Financial Counselor:       Phone#:  ?  ?The ?Data Collection Information Summary? for patients in Inpatient Rehabilitation Facilities with attached ?Privacy Act [place] Records? was provided and verbally reviewed with: Family ?  ?Emergency Contact Information ?Contact Information   ?  ?  Name Relation Home Work Mobile  ?  Hiney-GROSS,[name] Daughter [phone]      ?  Dohn,Lisa Daughter     [phone]  ?  ?   ?  ?  ?Current Medical History  ?Patient Admitting Diagnosis: CVA ?History of Present Illness: Rebecca Barnes is a 76 y.o. female with medical history significant of previous CVAs, anxiety, T2DM, HTN, Asthma and previous marijuana abuse who was transferred from [facility]  ED for the evaluation for word salad 12/02/21. Patient went to [facility] ED for evaluation.  She was hemodynamically stable.  CBC and CMP nonrevealing.  UA negative.  UDS negative.  Alcohol level negative.  CXR showed no acute changes.  Patient was transferred to [facility] for further evaluation. CTA  of the head & neck revealed patent carotid and vertebral arteries, occlusion of L M2, severe stenosis of right M2 and left P2.2D Echo unremarkable, with  no thrombus MRI of the brain  showed a moderate-sized acute left MCA distribution infarct involving the left temporal occipital region with no associated hemorrhage or significant regional mass effect, underlying age-related cerebral atrophy with moderate chronic microvascular ischemic disease, and with multiple scattered remote lacunar infarcts involving the bilateral basal ganglia/corona radiata, left thalamus, and left ventral pons. Per stroke team, Pt. To have zio patch for 30 day cardiac monitoring and loop recorder to be placed as an outpatient.  LE dopplers negative for DVT. Pt. Was seen by PT/OT/SLP who identified new functional deficits and recommended CIR to assist return to PLOF.  ?  ?Complete NIHSS TOTAL: 4 ?  ?Patient's medical record from [facility] has been reviewed by the rehabilitation admission coordinator and physician. ?  ?Past Medical History  ?    ?Past Medical History:  ?Diagnosis Date  ? Anxiety    ? Asthma    ? Diabetes mellitus without complication ([place])    ? Hypertension    ? Stroke [facility])    ?  ?  ?Has the patient had major surgery during 100 days prior to admission? No ?  ?Family History   ?  family history includes Cancer in her mother. ?  ?Current Medications ?  ?Current Facility-Administered Medications:  ?  acetaminophen (TYLENOL) tablet 650 mg, 650 mg, Oral, Q6H PRN **OR** acetaminophen (TYLENOL) suppository 650 mg, 650 mg, Rectal, Q6H PRN, Nicoletta Dress, Na, MD ?  aspirin EC tablet 81 mg, 81 mg, Oral, Daily, Corky Sox, MD, 81 mg at 12/05/21 0844 ?  clonazePAM (KLONOPIN) tablet 0.5 mg, 0.5 mg, Oral, BID PRN, Nicoletta Dress, Na, MD ?  clopidogrel (PLAVIX) tablet 75 mg, 75 mg, Oral, Daily, Thurnell Lose, MD, 75 mg at 12/05/21 0844 ?  enoxaparin (LOVENOX) injection 40 mg, 40 mg, Subcutaneous, Q24H, Li, Na, MD, 40 mg at 12/04/21 2154 ?  ezetimibe (ZETIA) tablet 10 mg, 10 mg, Oral, Daily, Thurnell Lose, MD, 10 mg at 12/05/21 0844 ?  FLUoxetine (PROZAC) capsule 20 mg, 20 mg, Oral, Daily, Li, Na, MD, 20 mg at  12/05/21 0844 ?  insulin aspart (novoLOG) injection 0-5 Units, 0-5 Units, Subcutaneous, QHS, Singh, Prashant K, MD ?  insulin aspart (novoLOG) injection 0-9 Units, 0-9 Units, Subcutaneous, TID WC, Thurnell Lose, MD, 1 Units at 12/05/21 0846 ?  MEDLINE mouth rinse, 15 mL, Mouth Rinse, BID, Li, Na, MD, 15 mL at 12/05/21 0846 ?  melatonin tablet 10 mg, 10 mg, Oral, QHS, Li, Na, MD, 10 mg at 12/04/21 2153 ?  multivitamin with minerals tablet 1 tablet, 1 tablet, Oral, Daily, Nicoletta Dress, Na, MD, 1 tablet at 12/05/21 0844 ?  niacin tablet 100 mg, 100 mg, Oral, QHS, Thurnell Lose, MD, 100 mg at 12/04/21 2154 ?  ondansetron (ZOFRAN) tablet 4 mg, 4 mg, Oral, Q6H PRN **OR** ondansetron (ZOFRAN) injection 4 mg, 4 mg, Intravenous, Q6H PRN, Nicoletta Dress, Na, MD ?  sodium chloride tablet 1 g, 1 g, Oral, TID WC, Li, Na, MD, 1 g at 12/05/21 0844 ?  ?Patients Current Diet:  ?Diet Order   ?  ?         ?    Diet Carb Modified Fluid consistency: Thin; Room service appropriate? Yes  Diet effective now       ?  ?  ?   ?  ?  ?   ?  ?  ?Precautions / Restrictions ?Precautions ?Precautions: Fall ?Precaution Comments: monitor HR ?Restrictions ?Weight Bearing Restrictions: No  ?  ?Has the patient had 2 or more falls or a fall with injury in the past year? Yes ?  ?Prior Activity Level ?Limited Community (1-2x/wk): Pt. went out for appointments ?  ?Prior Functional Level ?Self Care: Did the patient need help bathing, dressing, using the toilet or eating? Independent ?  ?Indoor Mobility: Did the patient need assistance with walking from room to room (with or without device)? Independent ?  ?Stairs: Did the patient need assistance with internal or external stairs (with or without device)? Needed some help ?  ?Functional Cognition: Did the patient need help planning regular tasks such as shopping or remembering to take medications? Dependent ?  ?Patient Information ?Are you of Hispanic, Latino/a,or Spanish origin?: A. No, not of Hispanic, Latino/a, or  Spanish origin ?What is your race?: A. White ?Do you need or want an interpreter to communicate with a doctor or health care staff?: 0. No ?  ?Patient's Response To:  ?Health Literacy and Transportation ?Is the patient able to respond to health literacy and transportation needs?: Yes ?Health Literacy - How often do you need to have someone help you when you read instructions, pamphlets, or other written material from your  doctor or pharmacy?: Never ?In the past 12 months, has lack of transportation kept you from medical appointments or from getting medications?: No ?In the past 12 months, has lack of transportation kept you from meetings, work, or from getting things needed for daily living?: No ?  ?Home Assistive Devices / Equipment ?Home Equipment: Conservation officer, nature (2 wheels), Shower seat, Grab bars - toilet, Grab bars - tub/shower ?  ?Prior Device Use: Indicate devices/aids used by the patient prior to current illness, exacerbation or injury? Walker ?  ?Current Functional Level ?Cognition ?  Arousal/Alertness: Awake/alert (tearful/distracted) ?Overall Cognitive Status: Difficult to assess ?Difficult to assess due to: Impaired communication ?Orientation Level: Oriented to person, Disoriented to time, Oriented to place ?General Comments: Following simple one step commands. Poor problem solving when sequencing multistep tasks. Difficult to fully assess due to aphasia ?Attention: Sustained ?Sustained Attention: Impaired ?Sustained Attention Impairment: Verbal basic, Functional basic ?Memory:  (DTA) ?Awareness: Impaired ?Awareness Impairment: Other (comment) (DTA, but pt tearful when attempting to communicate) ?Behaviors: Impulsive, Confabulation, Perseveration, Verbal agitation ?   ?Extremity Assessment ?(includes Sensation/Coordination) ?  Upper Extremity Assessment: RUE deficits/detail ?RUE Deficits / Details: Decreased strength and coorindation. Difficulty performing opposition and decreased engagment of RUE into  ADLs despite being dominant hand ?RUE Coordination: decreased fine motor, decreased gross motor  ?Lower Extremity Assessment: Defer to PT evaluation ?RLE Deficits / Details: weakness of 4- to 4 strength ?RLE Coor

## 2021-12-07 NOTE — Evaluation (Signed)
Physical Therapy Assessment and Plan ? ?Patient Details  ?Name: Rebecca Barnes ?MRN: [id number] ?Date of Birth: 05-29-1946 ? ?PT Diagnosis: Abnormal posture, Abnormality of gait, Cognitive deficits, Coordination disorder, Difficulty walking, Hemiplegia dominant, and Muscle weakness ?Rehab Potential: Good ?ELOS: 10-12 days  ? ?Today's Date: 12/07/2021 ?PT Individual Time: 1300-1400 ?PT Individual Time Calculation (min): 60 min   ? ?Hospital Problem: Principal Problem: ?  Acute cerebral infarction [facility]) ? ? ?Past Medical History:  ?Past Medical History:  ?Diagnosis Date  ? Anxiety   ? Asthma   ? Diabetes mellitus without complication (HCC)   ? Hypertension   ? Stroke [facility])   ? ?Past Surgical History:  ?Past Surgical History:  ?Procedure Laterality Date  ? arm fracture    ? arm surgery  ? CHOLECYSTECTOMY    ? ? ?Assessment & Plan ?Clinical Impression: Patient is a 76 y.o. year old female with past medical history of prior CVAs, anxiety, diabetes mellitus type 2, hypertension, asthma, obesity, depression, marijuana abuse presented to the hospital on 12/02/2021 with left MCA infarct.  History taken from chart review due to aphasia.  Patient went to outside hospital for evaluation of word salad.  Lab work as well as x-ray was relatively unremarkable.  She was transferred to [facility].  CT of head and neck showed occlusion of left M2, severe stenosis of right M2 and left P2.  Echocardiogram was unremarkable.  MRI of the brain showed moderate-sized acute left MCA distribution infarct involving the left temporal occipital region with no associated hemorrhage or significant regional mass effect.  There is also mention of underlying age-related cerebral atrophy with moderate chronic microvascular ischemic disease as well as multiple scattered remote lacunar infarcts involving the bilateral basal ganglia/corona radiata, left thalamus, left ventral pons.  She was seen by neurology recommended Zio patch for 30 days and loop  recorder as outpatient. Neurology recommended aspirin and Plavix x3 months then Plavix alone.  Lower extremity ultrasound was ordered by neurology as well.  Hospital course further complicated by hypokalemia, which was replaced.  Patient with resulting functional deficits with mobility, transfers, cognition, endurance.  Please see preadmission assessment from earlier today as well. Patient transferred to CIR on 12/06/2021 .  ? ?Patient currently requires min with mobility secondary to muscle weakness, decreased cardiorespiratoy endurance, decreased coordination and decreased motor planning, decreased visual acuity and field cut, decreased attention to right, decreased attention, decreased awareness, and decreased safety awareness, and decreased sitting balance, decreased standing balance, decreased postural control, hemiplegia, and decreased balance strategies.  Prior to hospitalization, patient was modified independent  with mobility and lived with Daughter in a House home.  Home access is  Ramped entrance. ? ?Patient will benefit from skilled PT intervention to maximize safe functional mobility, minimize fall risk, and decrease caregiver burden for planned discharge home with intermittent assist.  Anticipate patient will benefit from follow up [facility] at discharge. ? ?PT - End of Session ?Activity Tolerance: Tolerates 30+ min activity with multiple rests ?Endurance Deficit: Yes ?Endurance Deficit Description: Requires frequent rest breaks with minimal-moderate activity ?PT Assessment ?Rehab Potential (ACUTE/IP ONLY): Good ?PT Barriers to Discharge: Decreased caregiver support;Lack of/limited family support;Behavior ?PT Patient demonstrates impairments in the following area(s): Balance;Behavior;Edema;Endurance;Motor;Nutrition;Pain;Perception;Safety;Sensory;Skin Integrity ?PT Transfers Functional Problem(s): Bed Mobility;Bed to Chair;Car;Furniture ?PT Locomotion Functional Problem(s): Ambulation;Wheelchair  Mobility;Stairs ?PT Plan ?PT Intensity: Minimum of 1-2 x/day ,45 to 90 minutes ?PT Frequency: 5 out of 7 days ?PT Duration Estimated Length of Stay: 10-12 days ?PT Treatment/Interventions: Ambulation/gait training;Cognitive remediation/compensation;Discharge  planning;DME/adaptive equipment instruction;Functional mobility training;Pain management;Psychosocial support;Splinting/orthotics;Therapeutic Activities;UE/LE Strength taining/ROM;Visual/perceptual remediation/compensation;Wheelchair propulsion/positioning;UE/LE Coordination activities;Therapeutic Exercise;Stair training;Skin care/wound management;Patient/family education;Neuromuscular re-education;Disease management/prevention;Functional electrical stimulation;Community reintegration;Balance/vestibular training ?PT Transfers Anticipated Outcome(s): Mod I using LRAD ?PT Locomotion Anticipated Outcome(s): Supervision using LRAD >150 ft ?PT Recommendation ?Follow Up Recommendations: Home health PT ?Patient destination: Home ?Equipment Recommended: Rolling walker with 5" wheels ?Equipment Details: pt has RW in her room from home ? ? ?PT Evaluation ?Precautions/Restrictions ?Precautions ?Precautions: Fall ?Precaution Comments: mild R hemi, aphasic ?Restrictions ?Weight Bearing Restrictions: No ?Pain Interference ?Pain Interference ?Pain Effect on Sleep: 8. Unable to answer ?Pain Interference with Therapy Activities: 8. Unable to answer ?Pain Interference with Day-to-Day Activities: 8. Unable to answer ?Home Living/Prior Functioning ?Home Living ?Available Help at Discharge: Family;Available PRN/intermittently ?Type of Home: House ?Home Access: Ramped entrance ?Home Layout: Two level;Full bath on main level;Able to live on main level with bedroom/bathroom ?Bathroom Shower/Tub: Walk-in shower ?Bathroom Toilet: Handicapped height ?Additional Comments: all information taken from chart due to patient's communication deficits/expressive aphasia ? Lives With:  Daughter ?Prior Function ?Level of Independence: Independent with transfers;Requires assistive device for independence;Independent with basic ADLs (uses RW at carriage [place]?) ?Vision/Perception  ?Vision - History ?Ability to See in Adequate Light: 1 Impaired ?Vision - Assessment ?Eye Alignment: Within Functional Limits ?Ocular Range of Motion: Within Functional Limits ?Alignment/Gaze Preference: Within Defined Limits ?Tracking/Visual Pursuits: Requires cues, head turns, or add eye shifts to track;Impaired - to be further tested in functional context;Decreased smoothness of horizontal tracking;Decreased smoothness of vertical tracking (lost track of target to the R x2) ?Saccades: Impaired - to be further tested in functional context ?Convergence: Impaired (comment) ?Additional Comments: Pt endorsed that she could not see a target in her R visual field until it was brought into midline or the L visual field with both monocular and binocular assessments ?Perception ?Perception: Impaired ?Inattention/Neglect: Does not attend to right side of body (mild deficits) ?Praxis ?Praxis: Impaired ?Praxis Impairment Details: Motor planning  ?Cognition ?Overall Cognitive Status: Difficult to assess ?Arousal/Alertness: Awake/alert ?Orientation Level: Oriented to person ?Attention: Selective ?Selective Attention: Impaired ?Selective Attention Impairment: Verbal basic;Functional basic ?Memory:  (unable to assess) ?Awareness: Appears intact ?Behaviors: Impulsive ?Safety/Judgment: Impaired ?Comments: mild impulsivity, requires cues for safety ?Sensation ?Sensation ?Light Touch: Appears Intact ?Hot/Cold: Appears Intact ?Proprioception: Appears Intact ?Coordination ?Gross Motor Movements are Fluid and Coordinated: No ?Fine Motor Movements are Fluid and Coordinated: No ?Coordination and Movement Description: mild R hemi, decreased postural control ?Heel Shin Test: unable to follow cues or demonstration ?Motor  ?Motor ?Motor:  Hemiplegia;Abnormal postural alignment and control ?Motor - Skilled Clinical Observations: R hemi with kyphotic posture and decreased postural control  ? ?Trunk/Postural Assessment  ?Cervical Assessment ?Cervical Assessment: Exceptions to

## 2021-12-07 NOTE — Evaluation (Signed)
Occupational Therapy Assessment and Plan ? ?Patient Details  ?Name: Rebecca Barnes ?MRN: [id number] ?Date of Birth: [birth year] ? ?OT Diagnosis: abnormal posture, hemiplegia affecting dominant side, and muscle weakness (generalized) ?Rehab Potential: Rehab Potential (ACUTE ONLY): Good ?ELOS: 10-12 days  ? ?Today's Date: 12/07/2021 ?OT Individual Time: 5176-1607 ?OT Individual Time Calculation (min): 60 min    ? ?Hospital Problem: Principal Problem: ?  Acute cerebral infarction [facility]) ? ? ?Past Medical History:  ?Past Medical History:  ?Diagnosis Date  ? Anxiety   ? Asthma   ? Diabetes mellitus without complication (HCC)   ? Hypertension   ? Stroke [facility])   ? ?Past Surgical History:  ?Past Surgical History:  ?Procedure Laterality Date  ? arm fracture    ? arm surgery  ? CHOLECYSTECTOMY    ? ? ?Assessment & Plan ?Clinical Impression: 76 year old female with past medical history of prior CVAs, anxiety, diabetes mellitus type 2, hypertension, asthma, obesity, depression, marijuana abuse presented to the hospital on 12/02/2021 with left MCA infarct.  History taken from chart review due to aphasia.  Patient went to outside hospital for evaluation of word salad.  Lab work as well as x-ray was relatively unremarkable.  She was transferred to [facility].  CT of head and neck showed occlusion of left M2, severe stenosis of right M2 and left P2.  Echocardiogram was unremarkable.  MRI of the brain showed moderate-sized acute left MCA distribution infarct involving the left temporal occipital region with no associated hemorrhage or significant regional mass effect.  There is also mention of underlying age-related cerebral atrophy with moderate chronic microvascular ischemic disease as well as multiple scattered remote lacunar infarcts involving the bilateral basal ganglia/corona radiata, left thalamus, left ventral pons.  She was seen by neurology recommended Zio patch for 30 days and loop recorder as outpatient. Neurology  recommended aspirin and Plavix x3 months then Plavix alone.  Lower extremity ultrasound was ordered by neurology as well.  Hospital course further complicated by hypokalemia, which was replaced.  Patient with resulting functional deficits with mobility, transfers, cognition, endurance.  Please see preadmission assessment from earlier today as well. Patient transferred to CIR on 12/06/2021 .   ? ?Patient currently requires min with basic self-care skills secondary to muscle weakness, decreased cardiorespiratoy endurance, decreased coordination, decreased visual acuity, decreased problem solving, decreased memory, and delayed processing, and decreased standing balance, decreased postural control, hemiplegia, and decreased balance strategies.  Prior to hospitalization, patient could complete ADLs with independent . ? ?Patient will benefit from skilled intervention to decrease level of assist with basic self-care skills prior to discharge home with care partner.  Anticipate patient will require 24 hour supervision and follow up home health. ? ?OT - End of Session ?Activity Tolerance: Tolerates 10 - 20 min activity with multiple rests ?Endurance Deficit: Yes ?Endurance Deficit Description: generalized weakness ?OT Assessment ?Rehab Potential (ACUTE ONLY): Good ?OT Patient demonstrates impairments in the following area(s): Balance;Perception;Safety;Cognition;Sensory;Endurance;Vision;Motor;Pain ?OT Basic ADL's Functional Problem(s): Grooming;Bathing;Dressing;Toileting ?OT Transfers Functional Problem(s): Toilet;Tub/Shower ?OT Additional Impairment(s): None ?OT Plan ?OT Intensity: Minimum of 1-2 x/day, 45 to 90 minutes ?OT Frequency: 5 out of 7 days ?OT Duration/Estimated Length of Stay: 10-12 days ?OT Treatment/Interventions: Balance/vestibular training;Discharge planning;Pain management;Self Care/advanced ADL retraining;Therapeutic Activities;UE/LE Coordination activities;Cognitive remediation/compensation;Community  reintegration;Functional mobility training;Patient/family education;Skin care/wound managment;Visual/perceptual remediation/compensation;Therapeutic Exercise;DME/adaptive equipment instruction;Wheelchair propulsion/positioning;UE/LE Strength taining/ROM;Neuromuscular re-education ?OT Self Feeding Anticipated Outcome(s): no goal set ?OT Basic Self-Care Anticipated Outcome(s): (S) ?OT Toileting Anticipated Outcome(s): (S) ?OT Bathroom Transfers Anticipated Outcome(s): (S) ?OT Recommendation ?  Patient destination: Home ?Follow Up Recommendations: Home health OT ?Equipment Recommended: To be determined ? ? ?OT Evaluation ?Precautions/Restrictions  ?Precautions ?Precautions: Fall ?Precaution Comments: mild R hemi, aphasic ?Restrictions ?Weight Bearing Restrictions: No ?General ?Chart Reviewed: Yes ?Family/Caregiver Present: No ?Vital Signs ?Therapy Vitals ?Temp: 98.4 ?F (36.9 ?C) ?Pulse Rate: 80 ?Resp: 16 ?BP: (!) 124/59 ?Patient Position (if appropriate): Lying ?Oxygen Therapy ?SpO2: 99 % ?O2 Device: Room Air ?Pain ?Pain Assessment ?Pain Scale: 0-10 ?Pain Score: 0-No pain ?Home Living/Prior Functioning ?Home Living ?Family/patient expects to be discharged to:: Private residence ?Living Arrangements: Children ?Available Help at Discharge: Family, Available PRN/intermittently ?Type of Home: House ?Home Access: Ramped entrance ?Home Layout: Two level, Full bath on main level, Able to live on main level with bedroom/bathroom ?Bathroom Shower/Tub: Walk-in shower ?Bathroom Toilet: Handicapped height ?Additional Comments: ALF carriage house???- all information taken from chart ? Lives With: Daughter ?IADL History ?Homemaking Responsibilities:  (no family present to determine PLOF) ?Prior Function ?Level of Independence: Independent with transfers, Requires assistive device for independence, Independent with basic ADLs (uses RW at carriage house?) ?Vision ?Baseline Vision/History: 1 Wears glasses ?Ability to See in Adequate  Light: 1 Impaired ?Patient Visual Report: No change from baseline ?Vision Assessment?: Yes ?Eye Alignment: Within Functional Limits ?Ocular Range of Motion: Within Functional Limits ?Alignment/Gaze Preference: Within Defined Limits ?Tracking/Visual Pursuits: Requires cues, head turns, or add eye shifts to track;Impaired - to be further tested in functional context;Decreased smoothness of horizontal tracking;Decreased smoothness of vertical tracking ?Saccades: Additional eye shifts occurred during testing ?Convergence: Impaired (comment) ?Additional Comments: Possible diplopia and R field cut- was able to verbalize "it disappeared" when tracking to the R ?Perception  ?Perception: Impaired ?Inattention/Neglect: Does not attend to right side of body (minimal inattention) ?Praxis ?Praxis: Impaired ?Praxis Impairment Details: Motor planning ?Cognition ?Cognition ?Overall Cognitive Status: Difficult to assess ?Arousal/Alertness: Awake/alert ?Orientation Level: Nonverbal/unable to assess ?Person: Oriented ?Place:  (unable to answer) ?Situation:  (unable to answer) ?Memory:  (unable to assess) ?Attention: Selective ?Selective Attention: Impaired ?Selective Attention Impairment: Verbal basic;Functional basic ?Awareness: Appears intact ?Safety/Judgment: Appears intact ?Brief Interview for Mental Status (BIMS) ?Repetition of Three Words (First Attempt): None (aphasic) ?Temporal Orientation: Year: No answer ?Temporal Orientation: Month: No answer ?Temporal Orientation: Day: No answer ?Recall: "Sock": No answer ?Recall: "Blue": No answer ?Recall: "Bed": No answer ?BIMS Summary Score: 99 ?Sensation ?Sensation ?Light Touch: Appears Intact ?Coordination ?Gross Motor Movements are Fluid and Coordinated: No ?Fine Motor Movements are Fluid and Coordinated: No ?Coordination and Movement Description: R hemi ?Motor  ?Motor ?Motor: Hemiplegia ?Motor - Skilled Clinical Observations: R hemi with kyphotic posture  ?Trunk/Postural Assessment   ?Cervical Assessment ?Cervical Assessment: Exceptions to [facility] (forward head) ?Thoracic Assessment ?Thoracic Assessment: Exceptions to [facility] (rounded shoulders) ?Lumbar Assessment ?Lumbar Assessment: Exceptions to [facility]

## 2021-12-07 NOTE — Discharge Instructions (Addendum)
Inpatient Rehab Discharge Instructions ? ?Rebecca Barnes ?Discharge date and time:  12/12/2021 ? ?Activities/Precautions/ Functional Status: ?Activity: no lifting, driving, or strenuous exercise for until cleared by MD ?Diet: Carb modified/heart healthy ?Wound Care: none needed ?Functional status:  ?___ No restrictions     ___ Walk up steps independently ?___ 24/7 supervision/assistance   ___ Walk up steps with assistance ?__x_ Intermittent supervision/assistance  ___ Bathe/dress independently ?___ Walk with walker     ___ Bathe/dress with assistance ?___ Walk Independently    ___ Shower independently ?___ Walk with assistance    __x_ Shower with assistance ?__x_ No alcohol     ___ Return to work/school ________ ? ?Special Instructions: ? ?No driving, alcohol consumption or tobacco use.  ? ? ?COMMUNITY REFERRALS UPON DISCHARGE: ? ?HOME HEALTH: [facility]  PT, OT SP  PHONE; [phone] ? ?Medical Equipment/Items Ordered: HAS ALL NEEDED EQUIPMENT FROM PAST ADMISSIONS ?                                                Agency/Supplier:NA ? ? ? ?STROKE/TIA DISCHARGE INSTRUCTIONS ?SMOKING Cigarette smoking nearly doubles your risk of having a stroke & is the single most alterable risk factor  ?If you smoke or have smoked in the last 12 months, you are advised to quit smoking for your health. Most of the excess cardiovascular risk related to smoking disappears within a year of stopping. ?Ask you doctor about anti-smoking medications ?[facility] Quit Line: 1-800-QUIT NOW ?Free Smoking Cessation Classes [phone]  ?CHOLESTEROL Know your levels; limit fat & cholesterol in your diet  ?Lipid Panel  ?   ?Component Value Date/Time  ? CHOL 222 (H) 12/04/2021 0137  ? TRIG 163 (H) 12/04/2021 0137  ? HDL 67 12/04/2021 0137  ? CHOLHDL 3.3 12/04/2021 0137  ? VLDL 33 12/04/2021 0137  ? LDLCALC 122 (H) 12/04/2021 5456  ? LDLCALC 123 (H) 12/02/2021 1103  ? ? ? Many patients benefit from treatment even if their cholesterol is at  goal. ?Goal: Total Cholesterol (CHOL) less than 160 ?Goal:  Triglycerides (TRIG) less than 150 ?Goal:  HDL greater than 40 ?Goal:  LDL (LDLCALC) less than 100 ?  ?BLOOD PRESSURE American Stroke Association blood pressure target is less that 120/80 mm/Hg  ?Your discharge blood pressure is:  BP: (!) 127/48 Monitor your blood pressure ?Limit your salt and alcohol intake ?Many individuals will require more than one medication for high blood pressure  ?DIABETES (A1c is a blood sugar average for last 3 months) Goal HGBA1c is under 7% (HBGA1c is blood sugar average for last 3 months)  ?Diabetes: ?Diagnosis of diabetes:  Your A1c:6.3 %   ? ?Lab Results  ?Component Value Date  ? HGBA1C 6.3 (H) 12/04/2021  ? ? Your HGBA1c can be lowered with medications, healthy diet, and exercise. ?Check your blood sugar as directed by your physician ?Call your physician if you experience unexplained or low blood sugars.  ?PHYSICAL ACTIVITY/REHABILITATION Goal is 30 minutes at least 4 days per week  ?Activity: Increase activity slowly, ?Therapies: Physical Therapy: Home Health, Occupational Therapy: Home Health, and Speech Therapy: Home Health ?Return to work: n/a Activity decreases your risk of heart attack and stroke and makes your heart stronger.  It helps control your weight and blood pressure; helps you relax and can improve your mood. ?Participate in a regular exercise program. ?Talk  with your doctor about the best form of exercise for you (dancing, walking, swimming, cycling).  ?DIET/WEIGHT Goal is to maintain a healthy weight  ?Your discharge diet is:  ?Diet Order   ? ?       ?  Diet Carb Modified Fluid consistency: Thin; Room service appropriate? Yes  Diet effective now       ?  ? ?  ?  ? ?  ? thin liquids ?Your height is:  Height: 5\' 5"  (165.1 cm) ?Your current weight is: Weight: 103.2 kg ?Your Body Mass Index (BMI) is:  BMI (Calculated): 37.86 Following the type of diet specifically designed for you will help prevent another  stroke. ?Your goal weight range is:   ?Your goal Body Mass Index (BMI) is 19-24. ?Healthy food habits can help reduce 3 risk factors for stroke:  High cholesterol, hypertension, and excess weight.  ?RESOURCES Stroke/Support Group:  Call [phone] ?  ?STROKE EDUCATION PROVIDED/REVIEWED AND GIVEN TO PATIENT Stroke warning signs and symptoms ?How to activate emergency medical system (call 911). ?Medications prescribed at discharge. ?Need for follow-up after discharge. ?Personal risk factors for stroke. ?Pneumonia vaccine given: No ?Flu vaccine given: No ?My questions have been answered, the writing is legible, and I understand these instructions.  I will adhere to these goals & educational materials that have been provided to me after my discharge from the hospital.  ? ?  ? ?My questions have been answered and I understand these instructions. I will adhere to these goals and the provided educational materials after my discharge from the hospital. ? ?Patient/Caregiver Signature _______________________________ Date __________ ? ?Clinician Signature _______________________________________ Date __________ ? ?Please bring this form and your medication list with you to all your follow-up doctor's appointments.   ?

## 2021-12-07 NOTE — Progress Notes (Signed)
Inpatient Rehabilitation Center ?Individual Statement of Services ? ?Patient Name:  Rebecca Barnes  ?Date:  12/07/2021 ? ?Welcome to the Inpatient Rehabilitation Center.  Our goal is to provide you with an individualized program based on your diagnosis and situation, designed to meet your specific needs.  With this comprehensive rehabilitation program, you will be expected to participate in at least 3 hours of rehabilitation therapies Monday-Friday, with modified therapy programming on the weekends. ? ?Your rehabilitation program will include the following services:  Physical Therapy (PT), Occupational Therapy (OT), Speech Therapy (ST), 24 hour per day rehabilitation nursing, Therapeutic Recreaction (TR), Care Coordinator, Rehabilitation Medicine, Nutrition Services, and Pharmacy Services ? ?Weekly team conferences will be held on Wednesday to discuss your progress.  Your Inpatient Rehabilitation Care Coordinator will talk with you frequently to get your input and to update you on team discussions.  Team conferences with you and your family in attendance may also be held. ? ?Expected length of stay: 10-12 days  Overall anticipated outcome: supervision level ? ?Depending on your progress and recovery, your program may change. Your Inpatient Rehabilitation Care Coordinator will coordinate services and will keep you informed of any changes. Your Inpatient Rehabilitation Care Coordinator's name and contact numbers are listed  below. ? ?The following services may also be recommended but are not provided by the Inpatient Rehabilitation Center:  ? ?Home Health Rehabiltiation Services ?Outpatient Rehabilitation Services ? ?  ?Arrangements will be made to provide these services after discharge if needed.  Arrangements include referral to agencies that provide these services. ? ?Your insurance has been verified to be:  Medicare & AARP ?Your primary doctor is:  [facility] ? ?Pertinent information will be shared with  your doctor and your insurance company. ? ?Inpatient Rehabilitation Care Coordinator:  Dossie Der, LCSW [phone] or (C) [phone] ? ?Information discussed with and copy given to patient by: Lucy Chris, 12/07/2021, 12:53 PM    ?

## 2021-12-07 NOTE — Progress Notes (Signed)
Inpatient Rehabilitation Care Coordinator ?Assessment and Plan ?Patient Details  ?Name: Rebecca Barnes ?MRN: [id number] ?Date of Birth: 1946/02/14 ? ?Today's Date: 12/07/2021 ? ?Hospital Problems: Principal Problem: ?  Acute cerebral infarction [facility]) ? ?Past Medical History:  ?Past Medical History:  ?Diagnosis Date  ? Anxiety   ? Asthma   ? Diabetes mellitus without complication (HCC)   ? Hypertension   ? Stroke [facility])   ? ?Past Surgical History:  ?Past Surgical History:  ?Procedure Laterality Date  ? arm fracture    ? arm surgery  ? CHOLECYSTECTOMY    ? ?Social History:  reports that she has never smoked. She has never used smokeless tobacco. She reports that she does not currently use drugs after having used the following drugs: Marijuana. She reports that she does not drink alcohol. ? ?Family / Support Systems ?Marital Status: Widow/Widower ?Patient Roles: Parent ?Children: [name]-daughter [phone]  Lisa-daughter [phone] ?Other Supports: Friends ?Anticipated Caregiver: [name] and hired assist ?Ability/Limitations of Caregiver: [name] works from [employer] but has hired someone to come in form 9-3p. Her teenagares wil be ther until 9:00 each day and then off for the summer ?Caregiver Availability: 24/7 ?Family Dynamics: Close knit family pt wias living with [name] and was doing well until this stroke, this has affected her speech and is frustrating to her. Her other daughter is involved and supportive also ? ?Social History ?Preferred language: English ?Religion: None ?Cultural Background: No issues ?Education: HS ?Health Literacy - How often do you need to have someone help you when you read instructions, pamphlets, or other written material from your doctor or pharmacy?: Never ?Writes: Yes ?Employment Status: Retired ?Legal History/Current Legal Issues: No issues ?Guardian/Conservator: None-according to MD pt is not  fully capable of making her own decisions while here. Will look toward her two daughter's since no  formal POA/HCPOA in place  ? ?Abuse/Neglect ?Abuse/Neglect Assessment Can Be Completed: Yes ?Physical Abuse: Denies ?Verbal Abuse: Denies ?Sexual Abuse: Denies ?Exploitation of patient/patient's resources: Denies ?Self-Neglect: Denies ? ?Patient response to: ?Social Isolation - How often do you feel lonely or isolated from those around you?: Never ? ?Emotional Status ?Pt's affect, behavior and adjustment status: Pt is motivated to do well and has good movements in her arms and legs her main issues is her speech. She recovered from her last one and hopes to do so again. Pt is frustrated with this one and getting her thoughts across ?Recent Psychosocial Issues: past history of CVA's and diabetes ?Psychiatric History: History of anxiety takes medications for this and finds them helpful. She would benefit from neuro-psych but may not be able to see due to vacations ?Substance Abuse History: No issues ? ?Patient / Family Perceptions, Expectations & Goals ?Pt/Family understanding of illness & functional limitations: Pt and daughter can explain her strokes and speech deficits. Both do talk with the MD and feel their questions and concerns are being addressed. Daughter does want to be kept informed of her Mom's medical issues ?Premorbid pt/family roles/activities: Mom, retiree, grandmother, neighbor, etc ?Anticipated changes in roles/activities/participation: resume ?Pt/family expectations/goals: Pt states: " I will."  Nods and uses non-verbal gestures to get her point accross. [name] states: " I hope she does well but we will do what she needs for [place] to do for her." ? ?Community Resources ?Community Agencies: Other (Comment) ?Premorbid Home Care/DME Agencies: Other (Comment) (rw and shower chair-paid caregiver) ?Transportation available at discharge: daughter's ?Is the patient able to respond to transportation needs?: Yes ?In the past 12 months,  has lack of transportation kept you from medical appointments or from getting  medications?: No ?In the past 12 months, has lack of transportation kept you from meetings, work, or from getting things needed for daily living?: No ? ?Discharge Planning ?Living Arrangements: Children ?Support Systems: Children, Friends/neighbors, Other relatives, Church/faith community ?Type of Residence: Private residence ?Insurance Resources: [employer], Multimedia programmer (specify) Web designer) ?Financial Resources: [employer], Social Security ?Financial Screen Referred: No ?Living Expenses: Lives with family ?Money Management: Family ?Does the patient have any problems obtaining your medications?: No ?Home Management: pt and daughter ?Patient/Family Preliminary Plans: Return home with daughter and her grandchildren-teennagers, daughter has hired a caregiver from 9-3 pm and will have 24/7 care when she first goes home from rehab. Will await team evaluations and work on discharge needs. ?Care Coordinator Anticipated Follow Up Needs: HH/OP ? ?Clinical Impression ?Pleasant female who is frustrated with not being able to express herself due to her stroke. Her daughter's are involved and willing to assist she lives with [name] and her children. Will await team evaluations and work on discharge needs. ? ?Elease Hashimoto ?12/07/2021, 12:18 PM ? ?  ?

## 2021-12-07 NOTE — Evaluation (Signed)
Speech Language Pathology Assessment and Plan ? ?Patient Details  ?Name: Rebecca Barnes ?MRN: [id number] ?Date of Birth: 1946/08/12 ? ?SLP Diagnosis: Aphasia;Speech and Language deficits  ?Rehab Potential: Fair ?ELOS: 14 days  ? ?Today's Date: 12/07/2021 ?SLP Individual Time: 0800-0900 ?SLP Individual Time Calculation (min): 60 min ? ? ?Hospital Problem: Principal Problem: ?  Acute cerebral infarction [facility]) ? ?Past Medical History:  ?Past Medical History:  ?Diagnosis Date  ? Anxiety   ? Asthma   ? Diabetes mellitus without complication ([place])   ? Hypertension   ? Stroke [facility])   ? ?Past Surgical History:  ?Past Surgical History:  ?Procedure Laterality Date  ? arm fracture    ? arm surgery  ? CHOLECYSTECTOMY    ? ? ?Assessment / Plan / Recommendation ?Clinical Impression Pt is 76 year old female with past medical history of prior CVAs, anxiety, diabetes mellitus type 2, hypertension, asthma, obesity, depression, marijuana abuse presented to the hospital on 12/02/2021 with left MCA infarct.  History taken from chart review due to aphasia.  Patient went to outside hospital for evaluation of word salad.  Lab work as well as x-ray was relatively unremarkable.  She was transferred to [facility].  CT of head and neck showed occlusion of left M2, severe stenosis of right M2 and left P2.  Echocardiogram was unremarkable.  MRI of the brain showed moderate-sized acute left MCA distribution infarct involving the left temporal occipital region with no associated hemorrhage or significant regional mass effect.  There is also mention of underlying age-related cerebral atrophy with moderate chronic microvascular ischemic disease as well as multiple scattered remote lacunar infarcts involving the bilateral basal ganglia/corona radiata, left thalamus, left ventral pons.  She was seen by neurology recommended Zio patch for 30 days and loop recorder as outpatient. Neurology recommended aspirin and Plavix x3 months then Plavix alone.   Lower extremity ultrasound was ordered by neurology as well.  Hospital course further complicated by hypokalemia, which was replaced.  Patient with resulting functional deficits with mobility, transfers, cognition, endurance.  Please see preadmission assessment from earlier today as well. ?  ?Patient presents with severe-to-profound expressive and severe receptive aphasia. Expressive deficits can be characterized as circumlocutory, semantic/phonemic paraphasias, neologisms, and perseveration with inconsistent awareness of errors. Patient's verbal expression presents as fluent at times, however nonsensical in nature. Pt was able to elicit some automatic responses or common phrases such "thank you so much", "that looks great", and "I'm sorry". Pt often apologetic and became tearful during acknowledgment of verbal errors. Receptively, patient was able to answer some basic yes/no responses but required very clear instruction and prompting to respond with Y/N questions and for following all basic level commands. Pt had a tendency to attempt responding in an open ended manner, even when a verbal response was not the objective. Patient identified field of 2 objects with ~25% accuracy; field of 2 written words with 25% accuracy and max A cues. It should be noted pt exhibited right visual field deficits and benefited from vertical fields vs. horizontal for ease of comprehension and accuracy. WAB-B results are as followed: spontaneous speech content 1/10, spontaneous speech fluency 2/10, auditory verbal comprehension yes/no questions 0/10, following sequential commands 0/10, repetition 1/10 (repeated "bed" only), object naming 0/10, reading 0/10, writing 0/10. SLP unable to formally assess cognition secondary to severity of aphasia. Patient would benefit from skilled SLP intervention to maximize expressive/receptive language skills prior to discharge. Anticipate patient will require 24 hour supervision and will benefit from  follow  up SLP services. ?  ?Skilled Therapeutic Interventions          Speech/language evaluation completed. Please see above.     ?SLP Assessment ? Patient will need skilled Speech Lanaguage Pathology Services during CIR admission  ?  ?Recommendations ? Recommendations for Other Services: Therapeutic Recreation consult ?Therapeutic Recreation Interventions: Pet therapy ?Patient destination: Home ?Follow up Recommendations: 24 hour supervision/assistance;Outpatient SLP;Home Health SLP ?Equipment Recommended: None recommended by SLP  ?  ?SLP Frequency 3 to 5 out of 7 days   ?SLP Duration ? ?SLP Intensity ? ?SLP Treatment/Interventions 14 days ? ?Minumum of 1-2 x/day, 30 to 90 minutes ? ?Speech/Language facilitation;Cueing hierarchy;Functional tasks;Patient/family education;Therapeutic Activities   ? ?Pain ?Pain Assessment ?Pain Scale: 0-10 ?Pain Score: 0-No pain ? ?Prior Functioning ?Cognitive/Linguistic Baseline: Information not available ?Type of Home: House ? Lives With: Daughter ?Available Help at Discharge: Family;Available PRN/intermittently ? ?SLP Evaluation ?Cognition ?Overall Cognitive Status: Difficult to assess ?Arousal/Alertness: Awake/alert ?Orientation Level: Oriented to person ?Year:  (Unable 2/2 aphasia) ?Month:  (Unable 2/2 aphasia) ?Day of Week:  (Unable 2/2 aphasia) ?Attention: Sustained ?Sustained Attention: Impaired ?Sustained Attention Impairment: Verbal basic;Functional basic ?Memory:  (Unable 2/2 aphasia) ?Awareness Impairment: Other (comment) (patient appeared inconsistently aware of language deficits)  ?Comprehension ?Auditory Comprehension ?Overall Auditory Comprehension: Impaired ?Yes/No Questions: Impaired ?Basic Biographical Questions: 0-25% accurate ?Basic Immediate Environment Questions: 0-24% accurate ?Commands: Impaired ?One Step Basic Commands: 0-24% accurate ?Conversation: Simple ?Interfering Components: Attention ?EffectiveTechniques: Repetition;Visual/Gestural cues;Stressing  words ?Visual Recognition/Discrimination ?Discrimination: Not tested ?Reading Comprehension ?Reading Status: Impaired ?Word level: Impaired ?Sentence Level: Not tested ?Paragraph Level: Not tested ?Functional Environmental (signs, name badge): Not tested ?Interfering Components: Right neglect/inattention ?Expression ?Expression ?Primary Mode of Expression: Verbal ?Verbal Expression ?Overall Verbal Expression: Impaired ?Initiation: Impaired ?Automatic Speech: Name;Social Response ?Level of Generative/Spontaneous Verbalization: Sentence ?Repetition: Impaired ?Level of Impairment: Word level ?Naming: Impairment ?Responsive: 0-25% accurate ?Confrontation: Impaired ?Convergent: 0-24% accurate ?Divergent: Not tested ?Other Naming Comments: phonemic/semantic paraphasias, perseveration, neologisms, circumlocution ?Verbal Errors: Confabulation;Perseveration;Neologisms;Phonemic paraphasias;Semantic paraphasias;Jargon;Other (comment) (inconsistently aware of errors) ?Pragmatics: Unable to assess ?Effective Techniques: Melodic intonation ?Non-Verbal Means of Communication: Gestures ?Written Expression ?Dominant Hand: Right ?Written Expression: Exceptions to [facility] ?Self Formulation Ability: Letter ?Oral Motor ?Oral Motor/Sensory Function ?Overall Oral Motor/Sensory Function: Within functional limits ?Motor Speech ?Overall Motor Speech: Other (comment) (difficult to assess) ?Respiration: Within functional limits ?Phonation: Normal ?Resonance: Within functional limits ?Articulation: Impaired ?Level of Impairment: Word ?Intelligibility: Unable to assess (comment) ?Motor Planning: Not tested ?Motor Speech Errors: Not applicable ? ?Care Tool ?Care Tool Cognition ?Ability to hear (with hearing aid or hearing appliances if normally used Ability to hear (with hearing aid or hearing appliances if normally used): 0. Adequate - no difficulty in normal conservation, social interaction, listening to TV ?  ?Expression of Ideas and Wants  Expression of Ideas and Wants: 1. Rarely/Never expressess or very difficult - rarely/never expresses self or speech is very difficult to understand ?  ?Understanding Verbal and Non-Verbal Content Understanding

## 2021-12-07 NOTE — Progress Notes (Signed)
Patient ID: Rebecca Barnes, female   DOB: April 20, 1946, 76 y.o.   MRN: [id number] ?Met with the patient to introduce self, review rehab process, team conference and plan of care. Difficult to get information from the patient due to expressive aphasia, however appears to understand what she is being told if kept basic info. Reviewed medications and rationale with DAPT x 3 mths then Plavix solo per neurology. Reviewed dietary modifications recommended with CMM diet. Patient given handouts with tips and information covered verbally for family to review and take home. Continue to follow along to discharge to address educational needs of patient and family to facilitate preparation for discharge home with dtr. Dorien Chihuahua B ? ?

## 2021-12-08 ENCOUNTER — Telehealth: Payer: Self-pay | Admitting: *Deleted

## 2021-12-08 DIAGNOSIS — I639 Cerebral infarction, unspecified: Secondary | ICD-10-CM | POA: Diagnosis not present

## 2021-12-08 LAB — GLUCOSE, CAPILLARY
Glucose-Capillary: 130 mg/dL — ABNORMAL HIGH (ref 70–99)
Glucose-Capillary: 128 mg/dL — ABNORMAL HIGH (ref 70–99)
Glucose-Capillary: 107 mg/dL — ABNORMAL HIGH (ref 70–99)
Glucose-Capillary: 126 mg/dL — ABNORMAL HIGH (ref 70–99)

## 2021-12-08 MED ORDER — SALINE SPRAY 0.65 % NA SOLN
1.0000 | NASAL | Status: DC | PRN
  Administered 2021-12-09: 1 via NASAL
  Filled 2021-12-08: qty 44

## 2021-12-08 NOTE — Progress Notes (Signed)
Physical Therapy Session Note ? ?Patient Details  ?Name: Rebecca Barnes ?MRN: [id number] ?Date of Birth: 1946-08-08 ? ?Today's Date: 12/08/2021 ?PT Individual Time: 3354-5625 + [phone] ?PT Individual Time Calculation (min): 72 min  + 15 min ? ?Short Term Goals: ?Week 1:  PT Short Term Goal 1 (Week 1): Patient will ambulate >100 ft using LRAD with CGA. ?PT Short Term Goal 2 (Week 1): Patient will perform basic transfer with supervision consistently. ?PT Short Term Goal 3 (Week 1): Patient will improve Berg Balance Scale by 7 points, from 7/56, to meed MCID. ? ?Skilled Therapeutic Interventions/Progress Updates:  ?   ?1st session; ?Pt presents sitting in recliner to start session - agreeable to PT tx and does not appear to be in any pain. She has significant aphasia (expressive > receptive) with "word salad" - unable to produce coherent sentences and responds best to yes/no questions - she appears aware of the aphasia as she will say "i'm sorry" when she's unable to make an understandable sentence.  ? ?Transferred to w/c from recliner with minA stand<>pivot transfer. Transported downstairs to 53M rehab gym for time management and assisted to mat table with similar transfer.  ? ?Active warm up consisted of 1x10 sit<>stand to RW with CGA. We then went straight to gait training where she ambulated 2x75f + 1153f(seated rest break) with CGA and RW. She had several brief standing rest breaks during the 958fut with encouragement, able to continue. Verbal cueing for keeping body within walker frame, improving upright posture and forward gaze, and increasing B step length. She had some difficulty navigating her environment with obstacles that were placed on her R side - she also struggled with turning to the R and would benefit from further practice with this to improve safety and reduce falls risk. ? ?Continued session working on BLE strengthening and standing balance - alternating toe taps to 6inch block using RW for UE  support and 2.5# ankle weight bilaterally. -3x8 ? ?Transported upstairs to her room and completed stand<>pivot transfer with RW and CGA back to her recliner. Pt appearing confused on where she was when she brought back to her room - required + time and context clues to reorient. Remained seated with safety belt alarm on, all needs met, call bell in reach. ? ?2nd session: ?Attempted to see patient for 2nd scheduled therapy session. Pt appearing anxious and was trying to describe something that had happened and had difficulty expressing herself due to aphasia. After time, able to discern she experienced a nose bleed earlier this was confirmed with her RN. RN agreed she is okay to resume therapies and relayed to patient - patient continued to refuse due to anxiety regarding nose bleed. Spent 15 minutes coordinating care and attempting to initiate therapy session. She missed 30 minutes of skilled therapy due to refusal. ? ?Therapy Documentation ?Precautions:  ?Precautions ?Precautions: Fall ?Precaution Comments: mild R hemi, aphasic ?Restrictions ?Weight Bearing Restrictions: No ?General: ?  ? ?Therapy/Group: Individual Therapy ? ?Rebecca Barnes Rebecca Barnes ?12/08/2021, 7:49 AM  ?

## 2021-12-08 NOTE — Progress Notes (Signed)
Occupational Therapy Session Note ? ?Patient Details  ?Name: Rebecca Barnes ?MRN: [id number] ?Date of Birth: 1946-01-23 ? ?Today's Date: 12/08/2021 ?OT Individual Time: [phone] ?OT Individual Time Calculation (min): 60 min  ? ? ?Short Term Goals: ?Week 1:  OT Short Term Goal 1 (Week 1): Pt will don LB clothing with CGA ?OT Short Term Goal 2 (Week 1): Pt will complete toilet transfer with CGA using the LRAD ?OT Short Term Goal 3 (Week 1): Pt will complete grooming tasks using her BUE functionally without assist ? ? ?Skilled Therapeutic Interventions/Progress Updates:  ?  Pt semi reclined in bed, able to verbalize 2-3 word responses throughout, however sometimes using wrong word, although therapist able to infer meaning with pts gestures and nonverbal communication.  Pt agreeable to washing up at sink.  Supine to sit, sit to stand at RW, and ambulation to bathroom, commode transfer all with close supervision.  Pt had continent episode of urine.  Toileting completed with close supervision as well.  Pt then ambulated to sink using RW and bathed and dressed UB/LB with close supervision except needing min assist to thread pants over feet primarily due to grip socks resisting pts pulling and tight space at sink. Pt also needed intermittent verbal and visual cues to locate adl items on sink due to visual field cut.  Presenting with rash on back and mild bleeding and scratch marks on upper back.  Applied Sarna cream to all areas avoiding open skin.  Made MD and nurse aware.  Pt completed stand pivot to recliner at close supervision using RW.  Call bell in reach, seat alarm on, needs in reach. ? ?Therapy Documentation ?Precautions:  ?Precautions ?Precautions: Fall ?Precaution Comments: mild R hemi, aphasic ?Restrictions ?Weight Bearing Restrictions: No ? ? ?Therapy/Group: Individual Therapy ? ?Dian Situ Ziare Cryder ?12/08/2021, 12:25 PM ?

## 2021-12-08 NOTE — Progress Notes (Signed)
Occupational Therapy Session Note ? ?Patient Details  ?Name: Rebecca Barnes ?MRN: [id number] ?Date of Birth: 1946-08-16 ? ?Today's Date: 12/08/2021 ?OT Individual Time: 2778-2423 ?OT Individual Time Calculation (min): 35 min  ? ? ?Short Term Goals: ?Week 1:  OT Short Term Goal 1 (Week 1): Pt will don LB clothing with CGA ?OT Short Term Goal 2 (Week 1): Pt will complete toilet transfer with CGA using the LRAD ?OT Short Term Goal 3 (Week 1): Pt will complete grooming tasks using her BUE functionally without assist ? ?Skilled Therapeutic Interventions/Progress Updates:  ?   ? ?Therapy Documentation ?Precautions:  ?Precautions ?Precautions: Fall ?Precaution Comments: mild R hemi, aphasic ?Restrictions ?Weight Bearing Restrictions: No ? ?General:Patient received sitting up in chair. Patient replied no when asked if she had any pain.  Educated patient on OT. Patient had multiple bags on a chair. Patient asked if she would help to put her things away. Patient stood with CGA to walker. Clothing items draped over middle of walker and walked to the closet. Patient became emotional and was guided to sit down and rest. Patient expressing concern through facial expression and verbalizing "Why?". Therapist attempted to reassure patient. Will need continued education in the delivery method most effective. Patient willing to continue with treatment and ambulated across the hall to the therapy room. Attempted fine motor task drawing picture of item placed on the table (cone, glove) patient initiated the shape of the item but then began to string letters together in cursive. Patient aware that she was unable to copy the item as asked. Ambulated back to room with RW. Returned to bed to rest with min assist. Continue with POC to improve patient's safety and independence with ADL's   ?  ? ? ?Therapy/Group: Individual Therapy ? ?Warnell Forester ?12/08/2021, 1:45 PM ?

## 2021-12-08 NOTE — Progress Notes (Signed)
?                                                       PROGRESS NOTE ? ? ?Subjective/Complaints: ?No new complaints  ?Very happy with therapy yesterday ?Very appreciative of care ?Sarna lotion helped with rash ? ?ROS: +pseudobulbar affect as per Dois Davenport OT, +itchiness upper back ? ? ?Objective: ?  ?No results found. ? ?Recent Labs  ?  12/06/21 ?0115  ?WBC 9.8  ?HGB 13.6  ?HCT 41.6  ?PLT 364  ? ?Recent Labs  ?  12/06/21 ?0115  ?NA 134*  ?K 3.6  ?CL 104  ?CO2 23  ?GLUCOSE 128*  ?BUN 13  ?CREATININE 0.58  ?CALCIUM 9.2  ? ? ?Intake/Output Summary (Last 24 hours) at 12/08/2021 1217 ?Last data filed at 12/08/2021 7939 ?Gross per 24 hour  ?Intake 253 ml  ?Output --  ?Net 253 ml  ?  ? ?  ? ?Physical Exam: ?Vital Signs ?Blood pressure 138/63, pulse 97, temperature 98.7 ?F (37.1 ?C), resp. rate 16, height 5\' 5"  (1.651 m), weight 103.2 kg, SpO2 98 %. ?Gen: no distress, normal appearing, BMI 37.86 ?HEENT: oral mucosa pink and moist, NCAT ?Cardio: Reg rate ?Chest: normal effort, normal rate of breathing ?Abd: soft, non-distended ?Ext: no edema ?Musculoskeletal:  ?   Cervical back: Normal range of motion.  ?   Comments: No edema or tenderness in extremities  ?Skin: ?   General: Skin is warm and dry.  ?Neurological:  ?   Mental Status: She is alert.  ?   Comments: Alert ?Motor: RUE/RLE: 4 --4/5 proximal distal ?LUE/LE: 4+/5 proximal to distal ?Expressive aphasia >receptive aphasia  ?Psychiatric:  ?   Comments: Limited due to aphasia, +pseudobulbar affect ? ? ?Assessment/Plan: ?1. Functional deficits which require 3+ hours per day of interdisciplinary therapy in a comprehensive inpatient rehab setting. ?Physiatrist is providing close team supervision and 24 hour management of active medical problems listed below. ?Physiatrist and rehab team continue to assess barriers to discharge/monitor patient progress toward functional and medical goals ? ?Care Tool: ? ?Bathing ?   ?Body parts bathed by patient: Right arm, Left arm, Chest, Right  lower leg, Left lower leg, Face, Abdomen, Front perineal area, Buttocks, Right upper leg, Left upper leg  ?   ?  ?  ?Bathing assist Assist Level: Minimal Assistance - Patient > 75% ?  ?  ?Upper Body Dressing/Undressing ?Upper body dressing   ?What is the patient wearing?: Pull over shirt ?   ?Upper body assist Assist Level: Contact Guard/Touching assist ?   ?Lower Body Dressing/Undressing ?Lower body dressing ? ? ?   ?What is the patient wearing?: Pants, Incontinence brief ? ?  ? ?Lower body assist Assist for lower body dressing: Minimal Assistance - Patient > 75% ?   ? ?Toileting ?Toileting    ?Toileting assist Assist for toileting: Minimal Assistance - Patient > 75% ?  ?  ?Transfers ?Chair/bed transfer ? ?Transfers assist ?   ? ?Chair/bed transfer assist level: Minimal Assistance - Patient > 75% ?  ?  ?Locomotion ?Ambulation ? ? ?Ambulation assist ? ?   ? ?Assist level: Contact Guard/Touching assist ?Assistive device: Walker-rolling ?Max distance: 50 ft  ? ?Walk 10 feet activity ? ? ?Assist ?   ? ?Assist level: Contact Guard/Touching assist ?Assistive device: Walker-rolling  ? ?Walk  50 feet activity ? ? ?Assist   ? ?Assist level: Contact Guard/Touching assist ?Assistive device: Walker-rolling  ? ? ?Walk 150 feet activity ? ? ?Assist Walk 150 feet activity did not occur: Safety/medical concerns ? ?  ?  ?  ? ?Walk 10 feet on uneven surface  ?activity ? ? ?Assist Walk 10 feet on uneven surfaces activity did not occur: Safety/medical concerns ? ? ?  ?   ? ?Wheelchair ? ? ? ? ?Assist Is the patient using a wheelchair?: Yes ?Type of Wheelchair: Manual ?  ? ?Wheelchair assist level: Contact Guard/Touching assist, Supervision/Verbal cueing ?Max wheelchair distance: 36 ft  ? ? ?Wheelchair 50 feet with 2 turns activity ? ? ? ?Assist ? ?  ?  ? ? ?Assist Level: Moderate Assistance - Patient 50 - 74%  ? ?Wheelchair 150 feet activity  ? ? ? ?Assist ?   ? ? ?Assist Level: Total Assistance - Patient < 25%  ? ?Blood pressure  138/63, pulse 97, temperature 98.7 ?F (37.1 ?C), resp. rate 16, height 5\' 5"  (1.651 m), weight 103.2 kg, SpO2 98 %. ? ? ? Medical Problem List and Plan: ?1. Functional deficits with mobility, transfers, cognition, endurance secondary to acute left MCA distribution infarct . ?            -patient may not shower ?            -ELOS/Goals: 6-9 days/supervision ?           Continue CIR ? HFU scheduled ?2.  DVT prophylaxis/anticoagulation:   ?-Mechanical: Sequential compression devices, below knee Bilateral lower extremities ?Pharmaceutical: Lovenox:  ?            -antiplatelet therapy: Aspirin and Plavix x3 months, then Plavix alone ?3. Pain Management: As needed medications ?4.  Mood: Team support ?            -antipsychotic agents:N/A. ?5. Neuropsych: This patient is not capable of making decisions on her own behalf. ?6. Skin/Wound Care: Routine skin care ?7. Fluids/Electrolytes/Nutrition: Routine I's/Os.  CMP ordered for Monday ?8.  Hyperlipidemia: Zetia 10 ?9.  Depression: Prozac 20 ?10.  Sleep disturbance: Melatonin 10 ?11.  Essential hypertension ?            PTA patient was taking Norvasc 5, Cozaar 100 ?12.  Diabetes mellitus type 2 ?            PTA patient was taking metformin 500 ER ? D/c ISS.  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  ?14. 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 Hypoalbuminemia: discussed importance of choosing high protein foods ?16. Rash on upper back: continue Sarna lotion ?17. Obesity: BMI 37.86: provide dietary education ? ?LOS: ?2 days ?A FACE TO FACE EVALUATION WAS PERFORMED ? ?Wednesday P Tameria Patti ?12/08/2021, 12:17 PM  ? ?  ?

## 2021-12-08 NOTE — Telephone Encounter (Signed)
Transition Care Management Unsuccessful Follow-up Telephone Call ? ?Date of discharge and from where:  12/07/2021 [place] ? ?Attempts:  1st Attempt ? ?Reason for unsuccessful TCM follow-up call:  Unable to reach patient [facility].  ? ?  ?

## 2021-12-09 DIAGNOSIS — I639 Cerebral infarction, unspecified: Secondary | ICD-10-CM | POA: Diagnosis not present

## 2021-12-09 LAB — GLUCOSE, CAPILLARY
Glucose-Capillary: 124 mg/dL — ABNORMAL HIGH (ref 70–99)
Glucose-Capillary: 113 mg/dL — ABNORMAL HIGH (ref 70–99)

## 2021-12-09 MED ORDER — CLONAZEPAM 0.5 MG PO TABS
0.2500 mg | ORAL_TABLET | Freq: Two times a day (BID) | ORAL | Status: DC | PRN
Start: 2021-12-09 — End: 2021-12-09

## 2021-12-09 MED ORDER — METFORMIN HCL 500 MG PO TABS
500.0000 mg | ORAL_TABLET | Freq: Every day | ORAL | Status: DC
Start: 2021-12-10 — End: 2021-12-12
  Administered 2021-12-10 – 2021-12-12 (×3): 500 mg via ORAL
  Filled 2021-12-09 (×3): qty 1

## 2021-12-09 MED ORDER — HYDROXYZINE HCL 10 MG PO TABS: 10.0000 mg | ORAL_TABLET | Freq: Three times a day (TID) | ORAL | Status: DC | PRN

## 2021-12-09 MED ORDER — CLONAZEPAM 0.25 MG PO TBDP
0.2500 mg | ORAL_TABLET | Freq: Two times a day (BID) | ORAL | Status: DC | PRN
  Administered 2021-12-10: 0.25 mg via ORAL
  Filled 2021-12-09 (×2): qty 1

## 2021-12-09 NOTE — Progress Notes (Signed)
Occupational Therapy Session Note ? ?Patient Details  ?Name: Rebecca Barnes ?MRN: [id number] ?Date of Birth: 11-Jun-1946 ? ?Today's Date: 12/09/2021 ?OT Individual Time: 1350-1430 ?OT Individual Time Calculation (min): 40 min  ? ? ?Short Term Goals: ?Week 1:  OT Short Term Goal 1 (Week 1): Pt will don LB clothing with CGA ?OT Short Term Goal 2 (Week 1): Pt will complete toilet transfer with CGA using the LRAD ?OT Short Term Goal 3 (Week 1): Pt will complete grooming tasks using her BUE functionally without assist ? ?Skilled Therapeutic Interventions/Progress Updates:  ?  1:1 Pt received in the recliner and indicating through gestures she needed to go to the bathroom. Pt ambulated to the bathroom with RW with min A pt required mod A for toileting and changing brief. Pt ambulated to the sink to wash hands - pt demonstrated apraxia with getting paper towels and wet them instead of getting soap to wash hands- requiring max A to reorganize to complete task. Pt sat in recliner and don shoes - able to don left one but required A to don right one. Pt engaged in pipe tree activity with max A to visually attend to right side of table and to identify the correct piece with external aid of therapist pointing to the picture of the piece to locate. ? ?Functional ambulation with RW in busy environment, navigating around objects and over different thresholds. Pt ambulated from her room to the elevator and down to the gym with two seated rest breaks.Pt did demonstrate confusion with tasks and fatigue with having to be up on her feet. With navigating around cones on the floor after demonstration; pt completed with min guard with max cues. Pt missed cones and ran over a few with RW only noticing after running over them. Pt declined more activity on her feet. Returned to her room and checked in with the RN about pain meds for her head.  ? ?Therapy Documentation ?Precautions:  ?Precautions ?Precautions: Fall ?Precaution Comments: mild R  hemi, aphasic ?Restrictions ?Weight Bearing Restrictions: No ?General: ?  ?Vital Signs: ?Therapy Vitals ?Temp: 98.1 ?F (36.7 ?C) ?Temp Source: Oral ?Pulse Rate: 76 ?Resp: 17 ?BP: (!) 114/38 ?Patient Position (if appropriate): Sitting ?Oxygen Therapy ?SpO2: 98 % ?O2 Device: Room Air ?Pain: ?C/o headache - reported to nurse - allowed for rest breaks as needed ? ? ?Therapy/Group: Individual Therapy ? ?Adan Sis ?12/09/2021, 3:46 PM ?

## 2021-12-09 NOTE — Patient Care Conference (Signed)
Inpatient RehabilitationTeam Conference and Plan of Care Update ?Date: 12/09/2021   Time: 11:16 AM ?  ? ? ?Patient Name: Rebecca Barnes      ?Medical Record Number: [id number]  ?Date of Birth: 1945-10-06 ?Sex: Female         ?Room/Bed: 5C16C/5C16C-01 ?Payor Info: Payor: MEDICARE / Plan: MEDICARE PART A AND B / Product Type: *No Product type* /   ? ?Admit Date/Time:  12/06/2021  3:17 PM ? ?Primary Diagnosis:  Acute cerebral infarction [facility]) ? ?Hospital Problems: Principal Problem: ?  Acute cerebral infarction [facility]) ? ? ? ?Expected Discharge Date: Expected Discharge Date: 12/12/21 ? ?Team Members Present: ?Physician leading conference: Dr. Sula Soda ?Social Worker Present: Dossie Der, LCSW ?Nurse Present: Chana Bode, RN ?PT Present: Wynelle Link, PT ?OT Present: Dolphus Jenny, OT ?SLP Present: Other (comment) Sarita Bottom, SLP) ? ?   Current Status/Progress Goal Weekly Team Focus  ?Bowel/Bladder ? ? incontinent to bowel and bladder LBM  4/18         ?Swallow/Nutrition/ Hydration ? ?           ?ADL's ? ? close supervision to [facility] for self care and functionla mobility using RW; expressive aphasia and inattention vs. field cut are primary limiting barriers  supervision  self care training, visual scanning, attention and head turn technique, functional transfers   ?Mobility ? ? supervision bed mobility, CGA sit<>stand and bed<>chair transfers w/ RW, CGA gait ~124ft with RW. Expressive > receptive aphasia, R inattention vs field cut. Mild anxiety regarding mobility - needs encouragement.  mod I bed mobility and transfers, supervision gait  general strengthening and higher level balance training, pt education and safety training with R visual field cut, DC planning, gait training with LRAD   ?Communication ? ? Max to Total A  Mod A for auditory comprehension; Max A for verbal expression  yes/no responses, repetition of functional words and phrases, and ID of pictures and words in field of 2   ?Safety/Cognition/  Behavioral Observations ?           ?Pain ? ? Denies pain  denies pain      ?Skin ? ? skin intact  skin intact      ? ? ?Discharge Planning:  ?Home with daughter who has hired assist-will have 24/7 care for short time   ?Team Discussion: ?Patient with bowel and bladder incontinence post CVA. Pseudo bulbar affect; emotional anxious with expressive aphasia. Also requires cues for sequencing, with visual field cut, right inattention and poor motor planning. Some ideational apraxia noted as well. ? ?Patient on target to meet rehab goals: ?yes, currently patient needs close supervision for self care at the sink. She needs mod - max assist for expression with goals for vocalization set for max assist. Goals for discharge set for supervision overall with mod I for transfers. ? ?*See Care Plan and progress notes for long and short-term goals.  ? ?Revisions to Treatment Plan:  ?N/A ?  ?Teaching Needs: ?Safety, medication management, secondary risk management, transfers, toileting, etc  ?Current Barriers to Discharge: ?Decreased caregiver support, Home enviroment access/layout, and Incontinence ? ?Possible Resolutions to Barriers: ?Family education  ?  ? ? Medical Summary ?Current Status: obesity, rash on upper back, type 2 DM, pseudobulbar affect, visual field cut, polypharmacy ? Barriers to Discharge: Medical stability ? Barriers to Discharge Comments: obesity, rash on upper back, type 2 DM, pseudobulbar affect, visual field cut, polypharmacy ?Possible Resolutions to [employer] Focus: provided dietaty education, sarna lotion on back,  hydroxyzine prn, d/c lovenox, continue metformin, d/c CBGs ? ? ?Continued Need for Acute Rehabilitation Level of Care: The patient requires daily medical management by a physician with specialized training in physical medicine and rehabilitation for the following reasons: ?Direction of a multidisciplinary physical rehabilitation program to maximize functional independence : Yes ?Medical  management of patient stability for increased activity during participation in an intensive rehabilitation regime.: Yes ?Analysis of laboratory values and/or radiology reports with any subsequent need for medication adjustment and/or medical intervention. : Yes ? ? ?I attest that I was present, lead the team conference, and concur with the assessment and plan of the team. ? ? ?Chana Bode B ?12/09/2021, 1:56 PM  ? ? ? ? ? ? ?

## 2021-12-09 NOTE — Progress Notes (Signed)
Occupational Therapy Session Note ? ?Patient Details  ?Name: Bobette Leyh ?MRN: [id number] ?Date of Birth: 10-Aug-1946 ? ?Today's Date: 12/09/2021 ?OT Individual Time: 8101-7510 ?OT Individual Time Calculation (min): 25 min  ? ? ?Short Term Goals: ?Week 1:  OT Short Term Goal 1 (Week 1): Pt will don LB clothing with CGA ?OT Short Term Goal 2 (Week 1): Pt will complete toilet transfer with CGA using the LRAD ?OT Short Term Goal 3 (Week 1): Pt will complete grooming tasks using her BUE functionally without assist ? ?Skilled Therapeutic Interventions/Progress Updates:  ?Skilled OT intervention completed with focus on medical skin care and BUE strengthening/R NMR. Pt received seated in recliner, unable to state "yes" or "no" about pain however facial expressions did not indicate current distress. Pt did intermittently express a few fluid sentences during session including "you have beautiful eyes."  ? ?Therapist noticed a strip of active blood on her L wrist from tiny skin puncture that appeared to have come from scratching, with pt unaware and facially expressing concern over it. Applied pressure with active bleed mostly stopping but therapist applied pressure dressing just in case. ? ?With 3 pound dowel, pt completed the following BUE strengthening to promote functional use of RUE and overall functional needs: ?Chest presses 2x15 ?Bicep flexion 2x15 ? ?Therapist provided stabilization at R elbow for form, visual targets and occasional hand over hand 2/2 poor motor planning. Pt was left upright in recliner, with belt alarm on and all needs in reach at end of session. ? ? ?Therapy Documentation ?Precautions:  ?Precautions ?Precautions: Fall ?Precaution Comments: mild R hemi, aphasic ?Restrictions ?Weight Bearing Restrictions: No ? ? ? ?Therapy/Group: Individual Therapy ? ?Nayleen Janosik E Lanette Ell ?12/09/2021, 7:29 AM ?

## 2021-12-09 NOTE — Progress Notes (Signed)
Patient ID: Rebecca Barnes, female   DOB: April 24, 1946, 76 y.o.   MRN: [id number]  Met with pt and left a message for daughter to inform of team conference goals of supervision level and target discharge date of 4/22. Pt is very happy to be going home and is aware she will need continued home health therapies. Her speech does bother her and she becomes frustrated with this issues. She has her old walker will see if can get her a new one. Will await daughter;s return call to discuss family education and preference regarding home health agency ?

## 2021-12-09 NOTE — Progress Notes (Signed)
?                                                       PROGRESS NOTE ? ? ?Subjective/Complaints: ?Tearful today about her aphasia ?Feels Sarna lotion helps rash.  ?Discussed that Cbgs are stable. Will restart metformin and stop checks ? ?ROS: +pseudobulbar affect,+itchiness upper back, +aphasia ? ? ?Objective: ?  ?No results found. ? ?No results for input(s): WBC, HGB, HCT, PLT in the last 72 hours. ? ?No results for input(s): NA, K, CL, CO2, GLUCOSE, BUN, CREATININE, CALCIUM in the last 72 hours. ? ? ?Intake/Output Summary (Last 24 hours) at 12/09/2021 1121 ?Last data filed at 12/09/2021 0700 ?Gross per 24 hour  ?Intake 718 ml  ?Output --  ?Net 718 ml  ?  ? ?  ? ?Physical Exam: ?Vital Signs ?Blood pressure (!) 129/47, pulse 71, temperature 98 ?F (36.7 ?C), resp. rate 15, height 5\' 5"  (1.651 m), weight 103.2 kg, SpO2 94 %. ?Gen: no distress, normal appearing, BMI 37.86 ?HEENT: oral mucosa pink and moist, NCAT ?Cardio: Reg rate ?Chest: normal effort, normal rate of breathing ?Abd: soft, non-distended ?Ext: no edema ?Musculoskeletal:  ?   Cervical back: Normal range of motion.  ?   Comments: No edema or tenderness in extremities  ?Skin: ?   General: Skin is warm and dry.  ?Neurological:  ?   Mental Status: She is alert.  ?   Comments: Alert ?Motor: RUE/RLE: 4 --4/5 proximal distal ?LUE/LE: 4+/5 proximal to distal ?Expressive aphasia >receptive aphasia  ?Ambuating CG with RW ?Psychiatric:  ?   Comments: Limited due to aphasia, +pseudobulbar affect ? ? ?Assessment/Plan: ?1. Functional deficits which require 3+ hours per day of interdisciplinary therapy in a comprehensive inpatient rehab setting. ?Physiatrist is providing close team supervision and 24 hour management of active medical problems listed below. ?Physiatrist and rehab team continue to assess barriers to discharge/monitor patient progress toward functional and medical goals ? ?Care Tool: ? ?Bathing ?   ?Body parts bathed by patient: Right arm, Left arm, Chest,  Right lower leg, Left lower leg, Face, Abdomen, Front perineal area, Buttocks, Right upper leg, Left upper leg  ?   ?  ?  ?Bathing assist Assist Level: Minimal Assistance - Patient > 75% ?  ?  ?Upper Body Dressing/Undressing ?Upper body dressing   ?What is the patient wearing?: Pull over shirt ?   ?Upper body assist Assist Level: Contact Guard/Touching assist ?   ?Lower Body Dressing/Undressing ?Lower body dressing ? ? ?   ?What is the patient wearing?: Pants, Incontinence brief ? ?  ? ?Lower body assist Assist for lower body dressing: Minimal Assistance - Patient > 75% ?   ? ?Toileting ?Toileting    ?Toileting assist Assist for toileting: Minimal Assistance - Patient > 75% ?  ?  ?Transfers ?Chair/bed transfer ? ?Transfers assist ?   ? ?Chair/bed transfer assist level: Minimal Assistance - Patient > 75% ?  ?  ?Locomotion ?Ambulation ? ? ?Ambulation assist ? ?   ? ?Assist level: Contact Guard/Touching assist ?Assistive device: Walker-rolling ?Max distance: 50 ft  ? ?Walk 10 feet activity ? ? ?Assist ?   ? ?Assist level: Contact Guard/Touching assist ?Assistive device: Walker-rolling  ? ?Walk 50 feet activity ? ? ?Assist   ? ?Assist level: Contact Guard/Touching assist ?Assistive device: Walker-rolling  ? ? ?  Walk 150 feet activity ? ? ?Assist Walk 150 feet activity did not occur: Safety/medical concerns ? ?  ?  ?  ? ?Walk 10 feet on uneven surface  ?activity ? ? ?Assist Walk 10 feet on uneven surfaces activity did not occur: Safety/medical concerns ? ? ?  ?   ? ?Wheelchair ? ? ? ? ?Assist Is the patient using a wheelchair?: Yes ?Type of Wheelchair: Manual ?  ? ?Wheelchair assist level: Contact Guard/Touching assist, Supervision/Verbal cueing ?Max wheelchair distance: 36 ft  ? ? ?Wheelchair 50 feet with 2 turns activity ? ? ? ?Assist ? ?  ?  ? ? ?Assist Level: Moderate Assistance - Patient 50 - 74%  ? ?Wheelchair 150 feet activity  ? ? ? ?Assist ?   ? ? ?Assist Level: Total Assistance - Patient < 25%  ? ?Blood pressure  (!) 129/47, pulse 71, temperature 98 ?F (36.7 ?C), resp. rate 15, height 5\' 5"  (1.651 m), weight 103.2 kg, SpO2 94 %. ? ? ? Medical Problem List and Plan: ?1. Functional deficits with mobility, transfers, cognition, endurance secondary to acute left MCA distribution infarct . ?            -patient may not shower ?            -ELOS/Goals: 6-9 days/supervision ?           Continue CIR ? HFU scheduled ? -Interdisciplinary Team Conference today   ?2.  DVT prophylaxis/anticoagulation:   ?-Mechanical: Sequential compression devices, below knee Bilateral lower extremities ?Pharmaceutical: Lovenox:  ?            -antiplatelet therapy: Aspirin and Plavix x3 months, then Plavix alone ?3. Pain Management: As needed medications ?4.  Mood: Team support ?            -antipsychotic agents:N/A. ?5. Neuropsych: This patient is not capable of making decisions on her own behalf. ?6. Skin/Wound Care: Routine skin care ?7. Fluids/Electrolytes/Nutrition: Routine I's/Os.  CMP ordered for Monday ?8.  Hyperlipidemia: Zetia 10 ?9.  Depression: Prozac 20 ?10.  Sleep disturbance: Melatonin 10 ?11.  Essential hypertension ?            PTA patient was taking Norvasc 5, Cozaar 100- no need to restart ?12.  Diabetes mellitus type 2 ?           Restart metformin 500mg  daily.  ? D/c CBG checks ? D/c ISS.  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 Discussed with team.  ?14. 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 Hypoalbuminemia: discussed importance of choosing high protein foods ?16. Rash on upper back: continue Sarna lotion. Asked nursing to cover open areas with bandaides.  ?17. Obesity: BMI 37.86: provide dietary education ? ?LOS: ?3 days ?A FACE TO FACE EVALUATION WAS PERFORMED ? ?Sunday P Uri Covey ?12/09/2021, 11:21 AM  ? ?  ?

## 2021-12-09 NOTE — Progress Notes (Signed)
Physical Therapy Session Note ? ?Patient Details  ?Name: Rebecca Barnes ?MRN: [id number] ?Date of Birth: 05-19-1946 ? ?Today's Date: 12/09/2021 ?PT Individual Time: 8315-1761 ?PT Individual Time Calculation (min): 55 min  ? ?Short Term Goals: ?Week 1:  PT Short Term Goal 1 (Week 1): Patient will ambulate >100 ft using LRAD with CGA. ?PT Short Term Goal 2 (Week 1): Patient will perform basic transfer with supervision consistently. ?PT Short Term Goal 3 (Week 1): Patient will improve Berg Balance Scale by 7 points, from 7/56, to meed MCID. ? ?Skilled Therapeutic Interventions/Progress Updates:  ?   ?Pt supine in bed to start session - agreeable to PT tx. Does not appear to be in any pain. Continues to demonstrate significant expressive aphasia.  ? ?Assisted her with donning pants at bed level - minA for threading and then able to pull them up in standing once we sat EOB. She required minA for supine<>sitting EOB with hospital bed features, assist primarily for trunk support. Sit<>Stand to RW with supervision and able to pull pants over hips without assist.  ? ?Ambulated supervision and RW within her room to the sink where she completed a few self care tasks such as brushing her teeth and hand washing. Some moderate motor apraxia during these tasks - difficulty with controlling fine motor with applying toothpaste to toothbrush. She required cues for scanning R to locate items and had difficulty motor planning how to turn off the water by using the cold nozzle on the R side. Completed all these tasks in standing with supervision for safety - without rest break she ambulated ~48ft to [facility] rehab gym with supervision and RW - cueing her for keeping body within walker frame and scanning R to better understand her environment for safety.  ? ?SetupA on Nustep and completed 10 minutes with resistance set to 3, using BUE/BLE to target general strengthening and cardiovascular endurance training - achieved 453 steps in total while  maintaining ~20-40 steps/minute cadence. Pt moderately anxious regarding Nustep for unknown reason, but once she was setup and began activity, she calmed.  ? ?Instructed in gait training in [facility] hallways - ambulating ~181ft + ~3ft with standing rest break - supervision assist using RW. Gait speed slowed but no over signs of LOB - cues for scanning R, keeping body within walker, increasing B step length, and general safety.  ? ?In room, concluded session with 1x5 sit<>stands to RW with supervision from recliner height - reliant on UE support for completing standing transition.  ? ?Remained seated in recliner with safety belt alarm on, BLE elevated, tray in place, call bell in reach. ? ?Therapy Documentation ?Precautions:  ?Precautions ?Precautions: Fall ?Precaution Comments: mild R hemi, aphasic ?Restrictions ?Weight Bearing Restrictions: No ?General: ?  ? ?Therapy/Group: Individual Therapy ? ?Ashtynn Berke P Neftali Thurow ?12/09/2021, 7:38 AM  ?

## 2021-12-09 NOTE — Progress Notes (Signed)
Speech Language Pathology Daily Session Note ? ?Patient Details  ?Name: Rebecca Barnes ?MRN: [id number] ?Date of Birth: 09-03-1945 ? ?Today's Date: 12/09/2021 ?SLP Individual Time: 0800-0900 ?SLP Individual Time Calculation (min): 60 min ? ?Short Term Goals: ?Week 1: SLP Short Term Goal 1 (Week 1): Patient will communicate functional wants/needs using multimodal communication and max A cues ?SLP Short Term Goal 2 (Week 1): Patient will verbally repeat at the word level (e.g., "yes", "no", and functional/frequently used words) given max A multimodal cues to achieve 25% accuracy ?SLP Short Term Goal 3 (Week 1): Patient will respond to yes/no questions in regards to immediate environment and biographical information with max A multimodal cues to achieve 25% accuracy ?SLP Short Term Goal 4 (Week 1): Patient will identify objects/pictures in vertical field of 2 with max A multimodal cues to achieve 50% accuracy ?SLP Short Term Goal 5 (Week 1): Patient will identify written words in vertical field of 2 with max A multimodal cues to achieve 50% accuracy ? ?Skilled Therapeutic Interventions: ?Pt seen, this AM, for skilled ST intervention targeting communication goals outlined above. Pt encountered with eyes closed, resting. Alerted to name easily. Appropriately tearful and frustrated by current communication deficits; SLP provided therapeutic listening and simple, verbal support to calm pt and redirect to purpose of therapy and strengths demonstrated on evaluation. Agreeable to ST intervention at bedside.  ? ?SLP facilitated today's session by providing Max multimodal A, MIT techniques, 8-step continuum protocol, and tablet applications ([employer]) to engage pt in therapeutic tasks targeting auditory comprehension, verbal expression, and reading. With use of aforementioned skilled interventions, pt reciting 2 out of 10 numbers correct during rote speech tasks, and verbally approximated functional words <10% of the time.  Correctly identified pictures of body parts, in field of two, with 60% accuracy given Max verbal and visual A to attend to R visual field. Read single words re: body parts, in field of two, given Mod verbal and visual A for attending to R field and gesturing to body part. Pt was noted to communicate L head pain with use of hand gestures and single words. Benefited from use of drawings, simple language, and gesture prompts. Intermittent instances of automatic speech observed, such as "thank you," after receiving her pain medication from LPN and "bye" when SLP was departing.  ? ?Session concluded with pt in bed, bed alarm on, call bell within reach, and all immediate needs met. Continue per current ST POC. ? ?Pain ?Pt reports pain on L side of head by gesturing and stating "pain;" RN notified ? ?Therapy/Group: Individual Therapy ? ?Daevon Holdren A Miles Borkowski ?12/09/2021, 11:32 AM ?

## 2021-12-09 NOTE — Progress Notes (Signed)
Patient woke up with nasal congestion, scant trace of blood when blowing nose was noted. PRN saline nasal spray given. ?

## 2021-12-09 NOTE — IPOC Note (Signed)
Overall Plan of Care (IPOC) ?Patient Details ?Name: Rebecca Barnes ?MRN: [id number] ?DOB: [birth year] ? ?Admitting Diagnosis: Acute cerebral infarction [facility]) ? ?Hospital Problems: Principal Problem: ?  Acute cerebral infarction [facility]) ? ? ? ? Functional Problem List: ?Nursing Bladder, Bowel, Medication Management, Endurance, Safety, Pain  ?PT Balance, Behavior, Edema, Endurance, Motor, Nutrition, Pain, Perception, Safety, Sensory, Skin Integrity  ?OT Balance, Perception, Safety, Cognition, Sensory, Endurance, Vision, Motor, Pain  ?SLP Linguistic  ?TR    ?    ? Basic ADL?s: ?OT Grooming, Bathing, Dressing, Toileting  ? ?  Advanced  ADL?s: ?OT    ?   ?Transfers: ?PT Bed Mobility, Bed to Chair, Car, Furniture  ?OT Toilet, Tub/Shower  ? ?  Locomotion: ?PT Ambulation, Wheelchair Mobility, Stairs  ? ?  Additional Impairments: ?OT None  ?SLP Communication ?expression, comprehension ?   ?TR    ? ? ?Anticipated Outcomes ?Item Anticipated Outcome  ?Self Feeding no goal set  ?Swallowing ?   ?  ?Basic self-care ? (S)  ?Toileting ? (S) ?  ?Bathroom Transfers (S)  ?Bowel/Bladder ? manage bowel w mod I and bladder w toileting  ?Transfers ? Mod I using LRAD  ?Locomotion ? Supervision using LRAD >150 ft  ?Communication ? mod A  ?Cognition ?    ?Pain ? pain at or below level 4 w prns  ?Safety/Judgment ? maintain safety with cues  ? ?Therapy Plan: ?PT Intensity: Minimum of 1-2 x/day ,45 to 90 minutes ?PT Frequency: 5 out of 7 days ?PT Duration Estimated Length of Stay: 10-12 days ?OT Intensity: Minimum of 1-2 x/day, 45 to 90 minutes ?OT Frequency: 5 out of 7 days ?OT Duration/Estimated Length of Stay: 10-12 days ?SLP Intensity: Minumum of 1-2 x/day, 30 to 90 minutes ?SLP Frequency: 3 to 5 out of 7 days ?SLP Duration/Estimated Length of Stay: 14 days  ? ?Due to the current state of emergency, patients may not be receiving their 3-hours of Medicare-mandated therapy. ? ? Team Interventions: ?Nursing Interventions Patient/Family Education,  Bladder Management, Bowel Management, Medication Management, Pain Management, Disease Management/Prevention, Discharge Planning  ?PT interventions Ambulation/gait training, Cognitive remediation/compensation, Discharge planning, DME/adaptive equipment instruction, Functional mobility training, Pain management, Psychosocial support, Splinting/orthotics, Therapeutic Activities, UE/LE Strength taining/ROM, Visual/perceptual remediation/compensation, Wheelchair propulsion/positioning, UE/LE Coordination activities, Therapeutic Exercise, Stair training, Skin care/wound management, Patient/family education, Neuromuscular re-education, Disease management/prevention, Functional electrical stimulation, Community reintegration, Warden/ranger  ?OT Interventions Balance/vestibular training, Discharge planning, Pain management, Self Care/advanced ADL retraining, Therapeutic Activities, UE/LE Coordination activities, Cognitive remediation/compensation, Community reintegration, Functional mobility training, Patient/family education, Skin care/wound managment, Visual/perceptual remediation/compensation, Therapeutic Exercise, DME/adaptive equipment instruction, Wheelchair propulsion/positioning, UE/LE Strength taining/ROM, Neuromuscular re-education  ?SLP Interventions Speech/Language facilitation, Cueing hierarchy, Functional tasks, Patient/family education, Therapeutic Activities  ?TR Interventions    ?SW/CM Interventions Discharge Planning, Psychosocial Support, Patient/Family Education  ? ?Barriers to Discharge ?MD  Medical stability  ?Nursing Decreased caregiver support, Incontinence ?2 level ramped entry main B+B with daughter  ?PT Decreased caregiver support, Lack of/limited family support, Behavior ?   ?OT   ?   ?SLP   ?   ?SW   ?   ? ?Team Discharge Planning: ?Destination: PT-Home ,OT- Home , SLP-Home ?Projected Follow-up: PT-Home health PT, OT-  Home health OT, SLP-24 hour supervision/assistance, Outpatient  SLP, Home Health SLP ?Projected Equipment Needs: PT-Rolling walker with 5" wheels, OT- To be determined, SLP-None recommended by SLP ?Equipment Details: PT-pt has RW in her room from home, OT-  ?Patient/family involved in discharge planning: PT- Patient unable/family or caregiver  not available,  OT-Patient, SLP-Patient unable/family or caregive not available ? ?MD ELOS: 6-9 days ?Medical Rehab Prognosis:  Excellent ?Assessment: The patient has been admitted for CIR therapies with the diagnosis of acute cerebral infarction. The team will be addressing functional mobility, strength, stamina, balance, safety, adaptive techniques and equipment, self-care, bowel and bladder mgt, patient and caregiver education. Goals have been set at supervision. Anticipated discharge destination is home.  ? ? ?See Team Conference Notes for weekly updates to the plan of care  ?

## 2021-12-10 DIAGNOSIS — I639 Cerebral infarction, unspecified: Secondary | ICD-10-CM | POA: Diagnosis not present

## 2021-12-10 NOTE — Progress Notes (Signed)
?                                                       PROGRESS NOTE ? ? ?Subjective/Complaints: ?Tearful today as does not want to depend on family ?Did well with [place] [facility] ?Appreciate SW updating daughter [name] ? ?ROS: +pseudobulbar affect,+itchiness upper back, +aphasia, +anxiety as per daughter ? ? ?Objective: ?  ?No results found. ? ?No results for input(s): WBC, HGB, HCT, PLT in the last 72 hours. ? ?No results for input(s): NA, K, CL, CO2, GLUCOSE, BUN, CREATININE, CALCIUM in the last 72 hours. ? ? ?Intake/Output Summary (Last 24 hours) at 12/10/2021 1113 ?Last data filed at 12/10/2021 0700 ?Gross per 24 hour  ?Intake 375 ml  ?Output --  ?Net 375 ml  ?  ? ?  ? ?Physical Exam: ?Vital Signs ?Blood pressure (!) 132/45, pulse 61, temperature 97.9 ?F (36.6 ?C), resp. rate 16, height 5\' 5"  (1.651 m), weight 103.2 kg, SpO2 97 %. ?Gen: no distress, normal appearing, BMI 37.86 ?HEENT: oral mucosa pink and moist, NCAT ?Cardio: Reg rate ?Chest: normal effort, normal rate of breathing ?Abd: soft, non-distended ?Ext: no edema ?Musculoskeletal:  ?   Cervical back: Normal range of motion.  ?   Comments: No edema or tenderness in extremities  ?Skin: ?   General: Skin is warm and dry. Open lesions on upper back from scratching ?Neurological:  ?   Mental Status: She is alert.  ?   Comments: Alert ?Motor: RUE/RLE: 4 --4/5 proximal distal ?LUE/LE: 4+/5 proximal to distal ?Expressive aphasia >receptive aphasia  ?Ambuating CG with RW ?Psychiatric:  ?   Comments: Limited due to aphasia, +pseudobulbar affect ? ? ?Assessment/Plan: ?1. Functional deficits which require 3+ hours per day of interdisciplinary therapy in a comprehensive inpatient rehab setting. ?Physiatrist is providing close team supervision and 24 hour management of active medical problems listed below. ?Physiatrist and rehab team continue to assess barriers to discharge/monitor patient progress toward functional and medical goals ? ?Care Tool: ? ?Bathing ?   ?Body  parts bathed by patient: Right arm, Left arm, Chest, Right lower leg, Left lower leg, Face, Abdomen, Front perineal area, Buttocks, Right upper leg, Left upper leg  ?   ?  ?  ?Bathing assist Assist Level: Minimal Assistance - Patient > 75% ?  ?  ?Upper Body Dressing/Undressing ?Upper body dressing   ?What is the patient wearing?: Pull over shirt ?   ?Upper body assist Assist Level: Contact Guard/Touching assist ?   ?Lower Body Dressing/Undressing ?Lower body dressing ? ? ?   ?What is the patient wearing?: Pants, Incontinence brief ? ?  ? ?Lower body assist Assist for lower body dressing: Minimal Assistance - Patient > 75% ?   ? ?Toileting ?Toileting    ?Toileting assist Assist for toileting: Minimal Assistance - Patient > 75% ?  ?  ?Transfers ?Chair/bed transfer ? ?Transfers assist ?   ? ?Chair/bed transfer assist level: Minimal Assistance - Patient > 75% ?  ?  ?Locomotion ?Ambulation ? ? ?Ambulation assist ? ?   ? ?Assist level: Contact Guard/Touching assist ?Assistive device: Walker-rolling ?Max distance: 50 ft  ? ?Walk 10 feet activity ? ? ?Assist ?   ? ?Assist level: Contact Guard/Touching assist ?Assistive device: Walker-rolling  ? ?Walk 50 feet activity ? ? ?Assist   ? ?  Assist level: Contact Guard/Touching assist ?Assistive device: Walker-rolling  ? ? ?Walk 150 feet activity ? ? ?Assist Walk 150 feet activity did not occur: Safety/medical concerns ? ?  ?  ?  ? ?Walk 10 feet on uneven surface  ?activity ? ? ?Assist Walk 10 feet on uneven surfaces activity did not occur: Safety/medical concerns ? ? ?  ?   ? ?Wheelchair ? ? ? ? ?Assist Is the patient using a wheelchair?: Yes ?Type of Wheelchair: Manual ?  ? ?Wheelchair assist level: Contact Guard/Touching assist, Supervision/Verbal cueing ?Max wheelchair distance: 36 ft  ? ? ?Wheelchair 50 feet with 2 turns activity ? ? ? ?Assist ? ?  ?  ? ? ?Assist Level: Moderate Assistance - Patient 50 - 74%  ? ?Wheelchair 150 feet activity  ? ? ? ?Assist ?   ? ? ?Assist  Level: Total Assistance - Patient < 25%  ? ?Blood pressure (!) 132/45, pulse 61, temperature 97.9 ?F (36.6 ?C), resp. rate 16, height 5\' 5"  (1.651 m), weight 103.2 kg, SpO2 97 %. ? ? ? Medical Problem List and Plan: ?1. Functional deficits with mobility, transfers, cognition, endurance secondary to acute left MCA distribution infarct . ?            -patient may not shower ?            -ELOS/Goals: 6 days/supervision ?           Continue CIR ? HFU scheduled ? Daughter [name] updated by SW ?2.  DVT prophylaxis/anticoagulation:   ?-Mechanical: Sequential compression devices, below knee Bilateral lower extremities ?Pharmaceutical: Lovenox:  ?            -antiplatelet therapy: Aspirin and Plavix x3 months, then Plavix alone ?3. Pain Management: As needed medications ?4.  Mood: Team support ?            -antipsychotic agents:N/A. ?5. Neuropsych: This patient is not capable of making decisions on her own behalf. ?6. Skin/Wound Care: Routine skin care ?7. Fluids/Electrolytes/Nutrition: Routine I's/Os.   ?8.  Hyperlipidemia: continue Zetia 10 ?9.  Depression: continue Prozac 20 ?10.  Sleep disturbance: continue Melatonin 10 ?11.  Essential hypertension ?            PTA patient was taking Norvasc 5, Cozaar 100- no need to restart ?12.  Diabetes mellitus type 2 ?           Restart metformin 500mg  daily.  ? D/c CBG checks ? D/c ISS.  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 Discussed with team.  ?14. 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 Hypoalbuminemia: discussed importance of choosing high protein foods ?16. Rash on upper back: continue Sarna lotion. Asked nursing to cover open areas with bandaides.  ?17. Obesity: BMI 37.86: provide dietary education ? ?LOS: ?4 days ?A FACE TO FACE EVALUATION WAS PERFORMED ? ?Martha Clan P Aras Albarran ?12/10/2021, 11:13 AM  ? ?  ?

## 2021-12-10 NOTE — Progress Notes (Signed)
Patient ID: Rebecca Barnes, female   DOB: 09/10/1945, 75 y.o.   MRN: [id number]  had met with pt yesterday to give her the team conference update regarding discharge Sat and goals of supervision. Did reach daughter-[name] regarding team conference goals and if can come in for education prior to discharge. She can not due to has to work but therapists can call her. Have given therapy team daughter's number. Daughter reports has used [facility] and [facility] in the past either is fine. Has all equipment will work on discharge needs. ?

## 2021-12-10 NOTE — Progress Notes (Signed)
Physical Therapy Session Note ? ?Patient Details  ?Name: Rebecca Barnes ?MRN: [id number] ?Date of Birth: 1945-12-14 ? ?Today's Date: 12/10/2021 ?PT Individual Time: 1761-6073 [id number] ?PT Individual Time Calculation (min): 55 min + 25 min   ? ?Short Term Goals: ?Week 1:  PT Short Term Goal 1 (Week 1): Patient will ambulate >100 ft using LRAD with CGA. ?PT Short Term Goal 2 (Week 1): Patient will perform basic transfer with supervision consistently. ?PT Short Term Goal 3 (Week 1): Patient will improve Berg Balance Scale by 7 points, from 7/56, to meed MCID. ? ?Skilled Therapeutic Interventions/Progress Updates:  ?   ?1st session: ?Pt presents sitting in recliner and agreeable to PT tx - does not appear to be in any pain with expressive aphasia limiting. Sit<>stand to RW with supervision from recliner, slow to initiate but capable with + time.  ? ?Ambulated ~44ft from her room to [facility] rehab gym with supervision and RW - cues for scanning R and difficulty turning to the R when prompted. SetupA onto [employer] where she completed 10 minutes and covered 455 steps at workload 4, using BLE/BUE to target cardiovascular endurance, general strengthening, and increase overall activity tolerance. While completing Nustep, pt becoming emotional to what appears to be her current mobility status and deficits related to her CVA - but this was difficult to discern due to her significant aphasia. She was somewhat redirectable during this emotional state and able to complete activity.  ? ?Sit<>stand from Nustep with distant supervision to RW and ambulated ~66ft with supervision and RW to the other [facility] rehab gym to work on general strengthening and dynamic standing balance. In unsupported standing with supervision assist, completed russian twists + chest press with 4# med ball, 2x10 with seated rest break - limited thoracic mobility during these activities. Instructed in alternating toe taps to 4inch platform with RW support and supervision  assist for balance - 2x12 with seated rest breaks. Instructed in functional reaching task to challenge reach outside BOS - pt had difficulty with motor apraxia and comprehending instructions for this task - with modification, she was able to complete but then had poor frustration tolerance and began to become frustrated, calling activities "stupid" and wanting to return to her room.  ? ?Ambulated back to her room, ~24ft with supervision and RW with directional cues required to locate her room (right across hallway). Pt concluded session seated in recliner with safety belt alarm on and all needs within reach. MD entering for rounding.  ? ?2nd session: ?Pt sitting in recliner to start - agreeable to PT tx, denies pain. Sit<>stand to RW with supervision. Ambulates ~55ft to [facility] rehab gym across hallway with supervision and RW - directional cues required for locating. Worked on standing balance and compensation for R visual field cut - in standing with RW support, played games of connect-4 with bias to her R side to promote visual scanning. She required 1-2 games to fully comprehend rules and appeared to have familiarity with game that assisted with understanding. She continued to require mod cues for scanning R and had difficulty locating bright red pieces on her R until pointed out. Finished session with gait training in hallway - ambulating ~168ft with supervision and RW with cues for scanning environment her R, improving forward gaze, and sustaining upright posture. Pt directed back to her room (unable to locate on her own) and concluded session seated in recliner with safety belt alarm on, call bell in lap, all personal items in reach.  ? ?  Therapy Documentation ?Precautions:  ?Precautions ?Precautions: Fall ?Precaution Comments: mild R hemi, aphasic ?Restrictions ?Weight Bearing Restrictions: No ?General: ?  ? ?Therapy/Group: Individual Therapy ? ?Carlyne Keehan P Jamale Spangler ?12/10/2021, 7:40 AM  ?

## 2021-12-10 NOTE — Progress Notes (Signed)
Occupational Therapy Session Note ? ?Patient Details  ?Name: Rebecca Barnes ?MRN: [id number] ?Date of Birth: Oct 17, 1945 ? ?Today's Date: 12/10/2021 ?OT Individual Time: 1100-1130 ?OT Individual Time Calculation (min): 30 min  ? ? ?Short Term Goals: ?Week 1:  OT Short Term Goal 1 (Week 1): Pt will don LB clothing with CGA ?OT Short Term Goal 2 (Week 1): Pt will complete toilet transfer with CGA using the LRAD ?OT Short Term Goal 3 (Week 1): Pt will complete grooming tasks using her BUE functionally without assist ? ?Skilled Therapeutic Interventions/Progress Updates:  ?  Pt semi reclined in bed, nurse reports pt appearing upset but unable to express why. Pt weeping heavily.  Therapeutic use of self provided to pt and emotional support to decrease anxiety and implement self soothing strategies including deep breathing and firm proprioceptive input to chest with flat palm.  Pt verbal however therapist unable to decipher meaning due to word salad output despite max gentle cues provided to slow speed of speech and repeating words back to pt to attempt to increase pt awareness of errors.  Therapist called pts daughter who informed this therapist that patient has a history of anxiety and anxiety attacks and responds well from prn medication.  Made nurse aware.  Pt missed 30 minutes of treatment. ? ?Therapy Documentation ?Precautions:  ?Precautions ?Precautions: Fall ?Precaution Comments: mild R hemi, aphasic ?Restrictions ?Weight Bearing Restrictions: No ?General: ?General ?OT Amount of Missed Time: 30 Minutes ?Vital Signs: ? ?Pain: ?  ?ADL: ?ADL ?Eating: Supervision/safety ?Where Assessed-Eating: Chair ?Grooming: Supervision/safety ?Where Assessed-Grooming: Edge of bed ?Upper Body Bathing: Supervision/safety ?Where Assessed-Upper Body Bathing: Shower ?Lower Body Bathing: Minimal assistance ?Where Assessed-Lower Body Bathing: Shower ?Upper Body Dressing: Contact guard ?Where Assessed-Upper Body Dressing: Chair ?Lower Body  Dressing: Minimal assistance, Minimal cueing ?Where Assessed-Lower Body Dressing: Chair ?Toileting: Minimal assistance ?Where Assessed-Toileting: Toilet ?Toilet Transfer: Minimal assistance, Minimal verbal cueing ?Toilet Transfer Method: Ambulating ?Toilet Transfer Equipment: Bedside commode ?Walk-In Shower Transfer: Minimal assistance, Moderate cueing ?Walk-In Shower Transfer Method: Ambulating ?Walk-In Shower Equipment: Emergency planning/management officer ?Vision ?  ?Perception  ?  ?Praxis ?  ?Balance ?  ?Exercises: ?  ?Other Treatments:   ? ? ?Therapy/Group: Individual Therapy ? ?Dian Situ Harrington Jobe ?12/10/2021, 12:58 PM ?

## 2021-12-10 NOTE — Progress Notes (Addendum)
Speech Language Pathology Daily Session Note ? ?Patient Details  ?Name: Rebecca Barnes ?MRN: [id number] ?Date of Birth: 12/03/1945 ? ?Today's Date: 12/10/2021 ?SLP Individual Time: 0730-0815 ?SLP Individual Time Calculation (min): 45 min ? ?Short Term Goals: ?Week 1: SLP Short Term Goal 1 (Week 1): Patient will communicate functional wants/needs using multimodal communication and max A cues ?SLP Short Term Goal 2 (Week 1): Patient will verbally repeat at the word level (e.g., "yes", "no", and functional/frequently used words) given max A multimodal cues to achieve 25% accuracy ?SLP Short Term Goal 3 (Week 1): Patient will respond to yes/no questions in regards to immediate environment and biographical information with max A multimodal cues to achieve 25% accuracy ?SLP Short Term Goal 4 (Week 1): Patient will identify objects/pictures in vertical field of 2 with max A multimodal cues to achieve 50% accuracy ?SLP Short Term Goal 5 (Week 1): Patient will identify written words in vertical field of 2 with max A multimodal cues to achieve 50% accuracy ? ?Skilled Therapeutic Interventions: ?Pt seen, this AM, for skilled ST intervention targeting communication goals outlined above. Pt encountered awake/alert and sitting upright in bed. With use of gestures and single, coherent words with fluent jargon, pt communicated desire to have meal tray removed and sit in recliner chair. Transferred from bed with Min A and RW. Agreeable to ST intervention.  ? ?SLP facilitated today's session by providing overall Mod to Max multimodal A for completion of therapeutic and functional tasks targeting verbal repetition, answering functional yes/no questions within the context of daily activities, and engaging in receptive ID of pictures and words in a field of two to four via tablet application ([employer]).  ? ?Improvement noted in answering basic yes/no questions re: wants and needs, within functional context, when communication partner  utilized and emphasized single words and short phrases with accompanying hand gestures (e.g. "do you want to get up into the chair?" - SLP gestured upward and pointed to chair). Correctly identified pictures of clothing items and food in field of 2 with 90% accuracy with Max A; decreased accuracy in field f 3 and 4. Only achieved 50% accuracy when receptively identifying single words in field of 2-4 despite Max multimodal A. Attended to R visual field given Mod-Max verbal and visual A, which was successfully faded to Min visual A as task progressed. Pt did indicate awareness of new R visual attention deficits. ? ?Provided ongoing skilled education re: etiology of aphasia, rationale for ST intervention, and reinforcement. SLP utilized the following skilled interventions: MIT principles, corrective feedback, multimodal cueing, supported communication interventions, and use of gestures to aid in comprehension. Will plan to complete family education via phone or in-person during next session given pt's expected discharge date is 12/12/2021. Pt appreciative of intervention. ? ?Session concluded with pt in recliner chair, safety belt donned, call bell within reach, and all immediate needs met.  Continue per current ST POC. ? ?Pain ?No/Denies pain; NAD ? ?Therapy/Group: Individual Therapy ? ? A  ?12/10/2021, 10:07 AM ?

## 2021-12-11 ENCOUNTER — Other Ambulatory Visit: Payer: Self-pay | Admitting: Physician Assistant

## 2021-12-11 MED ORDER — EZETIMIBE 10 MG PO TABS: 10.0000 mg | ORAL_TABLET | Freq: Every day | ORAL | 0 refills | Status: DC

## 2021-12-11 MED ORDER — HYDROXYZINE HCL 10 MG PO TABS: 10.0000 mg | ORAL_TABLET | Freq: Three times a day (TID) | ORAL | 0 refills | Status: DC | PRN

## 2021-12-11 MED ORDER — CLOPIDOGREL BISULFATE 75 MG PO TABS: 75.0000 mg | ORAL_TABLET | Freq: Every day | ORAL | 0 refills | Status: DC

## 2021-12-11 MED ORDER — METFORMIN HCL 500 MG PO TABS: 500.0000 mg | ORAL_TABLET | Freq: Every day | ORAL | 0 refills | Status: DC

## 2021-12-11 MED ORDER — AMLODIPINE BESYLATE 5 MG PO TABS
5.0000 mg | ORAL_TABLET | Freq: Every day | ORAL | Status: DC
  Administered 2021-12-11 – 2021-12-12 (×2): 5 mg via ORAL
  Filled 2021-12-11 (×2): qty 1

## 2021-12-11 MED ORDER — FENOFIBRATE 54 MG PO TABS: 54.0000 mg | ORAL_TABLET | Freq: Every day | ORAL | 0 refills | Status: DC

## 2021-12-11 MED ORDER — CAMPHOR-MENTHOL 0.5-0.5 % EX LOTN: TOPICAL_LOTION | CUTANEOUS | 0 refills | Status: DC | PRN

## 2021-12-11 MED ORDER — MELATONIN 10 MG PO TABS: 10.0000 mg | ORAL_TABLET | Freq: Every day | ORAL | 0 refills | Status: DC

## 2021-12-11 NOTE — Progress Notes (Signed)
Inpatient Rehabilitation Care Coordinator ?Discharge Note DC SAT 4/22 ? ?Patient Details  ?Name: Rebecca Barnes ?MRN: [id number] ?Date of Birth: 12-27-1945 ? ? ?Discharge location: HOME WIHT DAUGHTER WHO HAS HIRED CARE WHILE SHE IS WORKING FROM 7-3P-24/7 SHORT TIME ? ?Length of Stay: 6 DAYS ? ?Discharge activity level: SUPERVISION LEVEL ? ?Home/community participation: ACTIVE ? ?Patient response YH:CWCBJS Literacy - How often do you need to have someone help you when you read instructions, pamphlets, or other written material from your doctor or pharmacy?: Never ? ?Patient response EG:BTDVVO Isolation - How often do you feel lonely or isolated from those around you?: Never ? ?Services provided included: MD, RD, PT, OT, SLP, RN, CM, Pharmacy, SW ? ?Financial Services:  ?Field seismologist Utilized: Medicare ?  ? ?Choices offered to/list presented to: PT AND DAUGHTER ? ?Follow-up services arranged:  ?Home Health, Patient/Family request agency HH/DME ?Home Health Agency: [facility]- PT OT SP  ?  ?  ?HH/DME Requested Agency: HAS HAD IN THE PAST ? ?Patient response to transportation need: ?Is the patient able to respond to transportation needs?: Yes ?In the past 12 months, has lack of transportation kept you from medical appointments or from getting medications?: No ?In the past 12 months, has lack of transportation kept you from meetings, work, or from getting things needed for daily living?: No ? ? ? ?Comments (or additional information): DAUGHTER WAS IN FOR EDUCATION DAY PRIOR TO DC. AWARE OF MOM'S NEED FOR 24/7 SUPERVISION DUE TO SPEECH DIFFICULTIES ? ?Patient/Family verbalized understanding of follow-up arrangements:  Yes ? ?Individual responsible for coordination of the follow-up plan: [name]-DAUGHTER [phone] ? ?Confirmed correct DME delivered: Lucy Chris 12/11/2021   ? ?Lucy Chris ?

## 2021-12-11 NOTE — Progress Notes (Addendum)
Occupational Therapy Discharge Summary ? ?Patient Details  ?Name: Rebecca Barnes ?MRN: [id number] ?Date of Birth: May 12, 1946 ? ?Today's Date: 12/11/2021 ?OT Individual Time: 3295-1884 ?OT Individual Time Calculation (min): 70 min  ? ? ?Skilled Treatment: Pt sitting up in recliner, no appearance of pain or distress.  Pt agreeable to bathe at shower level with encouragement.  Pt completed sit to stand and ambulation with RW to bathroom with supervision.  Shower transfer stepping over large threshold with CGA.  Pt completed all self care with supervision at sit<>stand level.  Pt transferred out of shower stepping over with CGA then ambulated to recliner with supervision using RW.  Donned all clothing with supervision with min multimodal cues for problem solving intermittently.  Pt dried hair with blowdryer.  Pt then instructed in BUE therex using 1lb dowel and min resistive band to complete 3 sets x 10 reps shoulder flexion, chest press horizontal, rows, lat pull downs.  Call bell in reach, seat alarm on.   ? ?Patient has met 5 of 6 long term goals due to improved activity tolerance, improved balance, ability to compensate for deficits, and improved coordination.  Pt requires multimodal cues for ADL item retrieval and ideation during basic self care.  Patient to discharge at overall Supervision level.  Patient's care partner, daughter and will be participating in caregiver education session this afternoon with PT and anticipate is independent to provide the necessary cognitive assistance at discharge.   ? ?Reasons goals not met: CGA for shower transfers; progress limited by pts anxiety levels. ? ?Recommendation:  ?Patient will benefit from ongoing skilled OT services in home health setting to continue to advance functional skills in the area of BADL. ? ?Equipment: ?No equipment provided; owns a RW and recommending purchase of shower chair. ? ?Reasons for discharge: treatment goals met and discharge from  hospital ? ?Patient/family agrees with progress made and goals achieved: Yes ? ?OT Discharge ?Precautions/Restrictions  ?Precautions ?Precautions: Fall ?Precaution Comments: R visual field cut, R hemi, expressive > receptive aphasia ?Restrictions ?Weight Bearing Restrictions: No ?Pain ?Pain Assessment ?Pain Scale: Faces ?Pain Score: 0-No pain ?Faces Pain Scale: No hurt ?ADL ?ADL ?Eating: Supervision/safety ?Where Assessed-Eating: Chair ?Grooming: Supervision/safety ?Where Assessed-Grooming: Sitting at sink ?Upper Body Bathing: Supervision/safety ?Where Assessed-Upper Body Bathing: Shower ?Lower Body Bathing: Contact guard ?Where Assessed-Lower Body Bathing: Shower ?Upper Body Dressing: Contact guard ?Where Assessed-Upper Body Dressing: Chair ?Lower Body Dressing: Supervision/safety ?Where Assessed-Lower Body Dressing: Chair ?Toileting: Supervision/safety ?Where Assessed-Toileting: Toilet ?Toilet Transfer: Close supervision ?Toilet Transfer Method: Ambulating ?Toilet Transfer Equipment: Bedside commode ?Walk-In Shower Transfer: Contact guard ?Walk-In Shower Transfer Method: Ambulating ?Walk-In Shower Equipment: Radio broadcast assistant ?Vision ?Baseline Vision/History: 1 Wears glasses ?Patient Visual Report: Other (comment) (unable to assess due to aphasia) ?Vision Assessment?: Vision impaired- to be further tested in functional context ?Eye Alignment: Within Functional Limits ?Ocular Range of Motion: Within Functional Limits ?Alignment/Gaze Preference: Within Defined Limits ?Tracking/Visual Pursuits: Requires cues, head turns, or add eye shifts to track;Impaired - to be further tested in functional context;Decreased smoothness of horizontal tracking;Decreased smoothness of vertical tracking ?Visual Fields: Right visual field deficit ?Perception  ?Perception: Impaired ?Inattention/Neglect: Does not attend to right side of body;Does not attend to right visual field ?Praxis ?Praxis: Impaired ?Praxis Impairment Details: Motor  planning ?Cognition ?Cognition ?Overall Cognitive Status: Difficult to assess ?Arousal/Alertness: Awake/alert ?Orientation Level: Nonverbal/unable to assess ?Person: Oriented ?Place:  (unable to assess due to aphasia) ?Situation:  (unable to assess due to aphasia) ?Memory:  (unable to assess due to aphasia) ?  Attention: Sustained ?Sustained Attention: Impaired ?Sustained Attention Impairment: Functional basic ?Selective Attention: Impaired ?Selective Attention Impairment: Functional basic ?Awareness: Impaired ?Awareness Impairment: Emergent impairment ?Problem Solving: Impaired ?Problem Solving Impairment: Functional basic ?Executive Function: Self Correcting;Self Monitoring ?Self Monitoring: Impaired ?Self Monitoring Impairment: Verbal basic ?Self Correcting: Impaired ?Self Correcting Impairment: Verbal basic ?Behaviors: Impulsive ?Safety/Judgment: Impaired ?Comments: mild impulsivity, requires cues for safety ?Brief Interview for Mental Status (BIMS) ?Repetition of Three Words (First Attempt): None (unable to assess due to aphasia) ?Temporal Orientation: Year: No answer (unable to assess due to aphasia) ?Temporal Orientation: Month: No answer (unable to assess due to aphasia) ?Temporal Orientation: Day: No answer (unable to assess due to aphasia) ?Recall: "Sock": No answer (unable to assess due to aphasia) ?Recall: "Blue": No answer (unable to assess due to aphasia) ?Recall: "Bed": No answer (unable to assess due to aphasia) ?BIMS Summary Score: 99 ?Sensation ?Sensation ?Light Touch: Appears Intact ?Hot/Cold: Appears Intact ?Proprioception: Appears Intact ?Stereognosis: Not tested ?Coordination ?Gross Motor Movements are Fluid and Coordinated: No ?Fine Motor Movements are Fluid and Coordinated: No ?Coordination and Movement Description: mild R hemi, generalized deconditioning ?Finger Nose Finger Test: Slight impairment RUE; WNL LUE ?Motor  ?Motor ?Motor: Hemiplegia;Abnormal postural alignment and control ?Motor -  Discharge Observations: improved strength on R side, remains to have limited endurance/activity tolerance and decreased dynamic postural control in standing ?Mobility  ?Bed Mobility ?Bed Mobility: Sit to Supine;Supine to Sit;Rolling Right;Rolling Left ?Rolling Right: Independent with assistive device ?Rolling Left: Independent with assistive device ?Supine to Sit: Independent with assistive device ?Sit to Supine: Independent with assistive device ?Transfers ?Sit to Stand: Independent with assistive device ?Stand to Sit: Independent with assistive device  ?Trunk/Postural Assessment  ?Cervical Assessment ?Cervical Assessment: Within Functional Limits ?Thoracic Assessment ?Thoracic Assessment: Exceptions to [facility] (rounded shoulders) ?Lumbar Assessment ?Lumbar Assessment: Within Functional Limits ?Postural Control ?Postural Control: Deficits on evaluation (mild delay righting reactions)  ?Balance ?Balance ?Balance Assessed: Yes ?Static Sitting Balance ?Static Sitting - Balance Support: Feet supported ?Static Sitting - Level of Assistance: 7: Independent ?Dynamic Sitting Balance ?Dynamic Sitting - Balance Support: No upper extremity supported;Feet supported ?Dynamic Sitting - Level of Assistance: 7: Independent ?Static Standing Balance ?Static Standing - Balance Support: Bilateral upper extremity supported ?Static Standing - Level of Assistance: 6: Modified independent (Device/Increase time) ?Dynamic Standing Balance ?Dynamic Standing - Balance Support: Bilateral upper extremity supported;During functional activity ?Dynamic Standing - Level of Assistance: 5: Stand by assistance ?Extremity/Trunk Assessment ?RUE Assessment ?RUE Assessment: Exceptions to [facility] ?Active Range of Motion (AROM) Comments: 100 degrees of shoulder flexion ?RUE Body System: Neuro ?Brunstrum levels for arm and hand: Arm;Hand ?Brunstrum level for arm: Stage V Relative Independence from Synergy ?Brunstrum level for hand: Stage V Independence from basic  synergies ?LUE Assessment ?LUE Assessment: Within Functional Limits ? ? ?Caryl Asp Kemo Spruce ?12/11/2021, 1:35 PM ?

## 2021-12-11 NOTE — Progress Notes (Signed)
Physical Therapy Session Note ? ?Patient Details  ?Name: Rebecca Barnes ?MRN: [id number] ?Date of Birth: 11-Sep-1945 ? ?Today's Date: 12/11/2021 ?PT Individual Time: 1025-8527 + 7824-2353 ?PT Individual Time Calculation (min): 42 min  + 27 min ? ?Short Term Goals: ?Week 1:  PT Short Term Goal 1 (Week 1): Patient will ambulate >100 ft using LRAD with CGA. ?PT Short Term Goal 2 (Week 1): Patient will perform basic transfer with supervision consistently. ?PT Short Term Goal 3 (Week 1): Patient will improve Berg Balance Scale by 7 points, from 7/56, to meed MCID. ? ?Skilled Therapeutic Interventions/Progress Updates:  ?   ? ?1st session: ?Pt supine in bed awake to start session - agreeable to PT tx - she denies pain but was expressing that she would like to take a shower today (this took time and context clues to discern due to significant aphasia). OT notified of requests.  ? ?Pt's bed and linen noted to be heavily saturated with urine, pt incontinent of bladder and seemingly unaware of this until notified. Supine<>sitting EOB with HOB slightly elevated with supervision assist. Sit<>Stand to RW with supervision from EOB and ambulated in room with supervision to toilet.  ? ?Pt able to manage LB dressing in standing and was continent of bladder and bowel once she sat. Removed dirty brief, pants with minA and pt able to doff dirty shirts with cues. Assisted her with washing her backside and legs for cleansing. She required assist for thoroughness with posterior pericare. Donned clean shirt with setupA, brief and pants with modA for time management. Ambulated sinkside with supervision and RW where pt continued self care task training - oral hygiene, washing underarms, and applying deodorant all with supervision - improved motor planning with tasks compared to prior sessions. Anticipate she will perform better in familiar home environment. Instructed in gait training for remainder of session - ambulating ~158f with supervision  and RW [facility] - cues for upright posture and forward gaze. Concluded session seated in recliner with safety belt alarm on, setupA for breakfast tray. All needs met  ? ?2nd session: ?Pt seen sitting in recliner with her daughter at bedside. Focused session on family education to prepare for tomorrow's discharge home. Daughter confirms ramped entrance 1 STE and hired caregiver assist. Pt completed sit<>stand to RW mod I and [facility] from [facility] with supervision and RW. SetupA on Nustep for 10 minutes using BUE/BLE resistance at 6. 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 Daughter reports some baseline deficits but verbalized understanding of all. Both daughter and patient reporting being confident regarding caring for her at home. No further questions or concerns. Pt ambulated back to her room with supervision and RW, remained seated in w/c with safety belt alarm on, all personal items in reach. Daughter at bedside and call bell in lap. ? ?Therapy Documentation ?Precautions:  ?Precautions ?Precautions: Fall ?Precaution Comments: mild R hemi, aphasic ?Restrictions ?Weight Bearing Restrictions: No ?General: ?  ? ?Therapy/Group: Individual Therapy ? ?CAlger SimonsPT ?12/11/2021, 7:35 AM  ?

## 2021-12-11 NOTE — Discharge Summary (Signed)
Physical Therapy Discharge Summary ? ?Patient Details  ?Name: Rebecca Barnes ?MRN: [id number] ?Date of Birth: 02/25/1946 ? ? ? ?Patient has met 7 of 8 long term goals due to improved activity tolerance, improved balance, improved postural control, increased strength, ability to compensate for deficits, improved attention, and improved awareness.  Patient to discharge at an ambulatory level Supervision.   Patient's care partner is independent to provide the necessary physical and cognitive assistance at discharge. Family education completed with her daughter on 4/21 reviewing primary deficits related to her CVA, incontinence, motor apraxia, problem solving, aphasia, and R sided weakness.  ? ?Reasons goals not met: Pt required supervision for bed<>chair transfers due to R visual field deficit, aphasia, and apraxia ? ?Recommendation:  ?Patient will benefit from ongoing skilled PT services in home health setting to continue to advance safe functional mobility, address ongoing impairments in home safety, caregiver training, general strengthening and endurance training, and minimize fall risk. ? ?Equipment: ?No equipment provided. Pt owns RW ? ?Reasons for discharge: treatment goals met and discharge from hospital ? ?Patient/family agrees with progress made and goals achieved: Yes ? ?PT Discharge ?Precautions/Restrictions ?Precautions ?Precautions: Fall ?Precaution Comments: R visual field cut, R hemi, expressive > receptive aphasia ?Restrictions ?Weight Bearing Restrictions: No ?Pain Interference ?Pain Interference ?Pain Effect on Sleep: 8. Unable to answer ?Pain Interference with Therapy Activities: 8. Unable to answer ?Pain Interference with Day-to-Day Activities: 8. Unable to answer ?Vision/Perception  ?Vision - History ?Ability to See in Adequate Light: 1 Impaired ?Vision - Assessment ?Eye Alignment: Within Functional Limits ?Ocular Range of Motion: Within Functional Limits ?Alignment/Gaze Preference: Within Defined  Limits ?Tracking/Visual Pursuits: Requires cues, head turns, or add eye shifts to track;Impaired - to be further tested in functional context;Decreased smoothness of horizontal tracking;Decreased smoothness of vertical tracking ?Saccades: Impaired - to be further tested in functional context ?Convergence: Impaired (comment) ?Additional Comments: Pt suggested she has difficulty seeing targets/stimuli on her R visual field that was observed during functional tasks. ?Perception ?Perception: Impaired ?Inattention/Neglect: Does not attend to right side of body;Does not attend to right visual field (mild) ?Praxis ?Praxis: Impaired ?Praxis Impairment Details: Motor planning  ?Cognition ?  ?Sensation ?Sensation ?Light Touch: Appears Intact ?Hot/Cold: Appears Intact ?Proprioception: Appears Intact ?Stereognosis: Not tested ?Coordination ?Gross Motor Movements are Fluid and Coordinated: No ?Coordination and Movement Description: mild R hemi, generalized deconditioning ?Motor  ?Motor ?Motor: Hemiplegia;Abnormal postural alignment and control ?Motor - Discharge Observations: improved strength on R side, remains to have limited endurance/activity tolerance and decreased dynamic postural control in standing  ?Mobility ?Bed Mobility ?Bed Mobility: Sit to Supine;Supine to Sit;Rolling Right;Rolling Left ?Rolling Right: Independent with assistive device ?Rolling Left: Independent with assistive device ?Supine to Sit: Independent with assistive device ?Sit to Supine: Independent with assistive device ?Transfers ?Transfers: Sit to Stand;Stand to [employer] Transfers ?Sit to Stand: Independent with assistive device ?Stand to Sit: Independent with assistive device ?Stand Pivot Transfers: Supervision/Verbal cueing ?Stand Pivot Transfer Details: Verbal cues for precautions/safety;Verbal cues for safe use of DME/AE ?Transfer (Assistive device): Rolling walker ?Locomotion  ?Gait ?Ambulation: Yes ?Gait Assistance: Supervision/Verbal  cueing ?Gait Distance (Feet): 150 Feet ?Assistive device: Rolling walker ?Gait Assistance Details: Verbal cues for technique;Verbal cues for precautions/safety;Verbal cues for gait pattern;Verbal cues for safe use of DME/AE ?Gait ?Gait: Yes ?Gait Pattern: Impaired ?Gait Pattern: Step-through pattern;Decreased stride length;Trunk flexed (downward head gaze) ?Stairs / Additional Locomotion ?Stairs: No ?Wheelchair Mobility ?Wheelchair Mobility: No  ?Trunk/Postural Assessment  ?Cervical Assessment ?Cervical Assessment: Exceptions to [facility] (forward head) ?  Thoracic Assessment ?Thoracic Assessment: Exceptions to [facility] (rounded shoulders) ?Lumbar Assessment ?Lumbar Assessment: Exceptions to [facility] (posterior pelvic tilt) ?Postural Control ?Postural Control: Deficits on evaluation (righting reactions delayed - mild)  ?Balance ?Balance ?Balance Assessed: Yes ?Static Sitting Balance ?Static Sitting - Balance Support: Feet supported ?Static Sitting - Level of Assistance: 7: Independent ?Dynamic Sitting Balance ?Dynamic Sitting - Balance Support: No upper extremity supported;Feet supported ?Dynamic Sitting - Level of Assistance: 7: Independent ?Static Standing Balance ?Static Standing - Balance Support: Bilateral upper extremity supported ?Static Standing - Level of Assistance: 6: Modified independent (Device/Increase time) ?Dynamic Standing Balance ?Dynamic Standing - Balance Support: Bilateral upper extremity supported;During functional activity ?Dynamic Standing - Level of Assistance: 5: Stand by assistance ?Extremity Assessment  ?  ?  ?RLE Assessment ?RLE Assessment: Exceptions to [facility] ?General Strength Comments: Difficulty following cues for formal testing - grossly 4/5 throughout ?LLE Assessment ?LLE Assessment: Exceptions to [facility] ?General Strength Comments: Grossly 4/5 throughout ? ? ? ?Raijon Lindfors P Edin Kon PT, DPT ?12/11/2021, 7:44 AM ?

## 2021-12-11 NOTE — Progress Notes (Incomplete)
Inpatient Rehabilitation Discharge Medication Review by a Pharmacist ? ?A complete drug regimen review was completed for this patient to identify any potential clinically significant medication issues. ? ?High Risk Drug Classes Is patient taking? Indication by Medication  ?Antipsychotic No   ?Anticoagulant No   ?Antibiotic No   ?Opioid Yes Norco - acute pain  ?Antiplatelet Yes ASA/plavix x 3 weeks f/b plavix alone (ASA end date 12/24/2021) - CVA  ?Hypoglycemics/insulin Yes Glucophage XR, novolog - diabetes T2  ?Vasoactive Medication Yes Norvasc, cozaar - HTN  ?Chemotherapy No   ?Other Yes Zetia, niacin - HLD ?Prozac - depression ?Melatonin, maybe trazodone - sleep ?Atarax - allergies/itching ?Klonopin - anxiety ?MIV - supplementation ?Sodium chloride tablet - sodium deficiency ?Robaxin - muscle spasms  ? ? ? ?Type of Medication Issue Identified Description of Issue Recommendation(s)  ?Drug Interaction(s) (clinically significant) ?    ?Duplicate Therapy ?    ?Allergy ?    ?No Medication Administration End Date ? ASA DAPT therapy x 3 weeks, f/b Plavix alone. ASA end date 12/24/2021  ?Incorrect Dose ?    ?Additional Drug Therapy Needed ?    ?Significant med changes from prior encounter (inform family/care partners about these prior to discharge).    ?Other ?    ? ? ?Clinically significant medication issues were identified that warrant physician communication and completion of prescribed/recommended actions by midnight of the next day:  No ? ?Time spent performing this drug regimen review (minutes):  30 ? ? ?Rebecca Barnes Rebecca Barnes ?12/11/2021 11:01 AM ?

## 2021-12-11 NOTE — Progress Notes (Signed)
Speech Language Pathology Discharge Summary ? ?Patient Details  ?Name: Rebecca Barnes ?MRN: [id number] ?Date of Birth: 09/21/1945 ? ?Today's Date: 12/11/2021 ?SLP Individual Time: 0930-1030 ?SLP Individual Time Calculation (min): 60 min ? ? ?Skilled Therapeutic Interventions:  ?Pt seen this date for skilled ST intervention targeting communication goals outlined in care plan. Pt encountered awake/alert and upright in recliner chair. Pleasant and cooperative.  ?Independently communicating that she was cold via single comprehensible words and gesturing. Agreeable to ST intervention. ? ?Followed one-step motor commands during restorative/therapeutic task with ~60% accuracy given Mod I; improved to 100% accuracy given Max verbal and visual/model A. Matched AAC pictures to objects with 90% accuracy with Mod I. Accurately imitated her last name x 2 given Max multimodal A (benefited from oral placement cues); unable to successfully imitate additional words. Benefited from multimodal cueing, 8-step continuum, corrective feedback, and articulatory placement cues. Attended to R field given intermittent Mod verbal and visual A; intermittently attend to R field with Supervision given visual anchor. Receptively identified pictures in field of 2 with ~80% accuracy given Min A. ? ?Requested to utilize the bathroom with use of hand gesture/pointing with eventual verbalization of "bathroom." Transferred pt from recliner chair to toliet with RW and Min A. Pt incontinent of bowel; continent of bladder. Total A for peri care and Mod A for pulling up pants after tolieting. Supervision A for washing hands. SLP called pt's daughter at the end of today's session to provide update on care plan, pt's current deficits, supported communication interventions, and discharge recommendations; however, she did not answer and unable to leave voicemail. Left education handouts and phone number with SW to provide to daughter.  ? ?Sessions concluded with  recliner chair, safety belt donned, call bell within reach, and all immediate needs addressed. Discharge from [facility] with upcoming CIR discharge set for 4/22. ? ? ?Patient has met 2 of 2 long term goals.  Patient to discharge at overall Mod;Max level.  ?Reasons goals not met: N/A  ? ?Clinical Impression/Discharge Summary: Pt has demonstated steady improvement in regards to her auditory comprehension and verbal expression since CIR admission, meeting 2 out of 2 long-term goals. 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 At time of discharge, pt utilized hand gestures with inconsistent verbalization of single comprehensible words (appeared automatic in nature) to communicate immediate wants and needs. Introduced Chemical engineer. Pt education completed via hand outs left for caregiver (attempted to call with no answer). Recommend 24/7 supervision and assistance upon d/c and HH or OP ST intervention.   ? ?Care Partner:  ?Caregiver Able to Provide Assistance: Yes  ?Type of Caregiver Assistance: Cognitive ? ?Recommendation:  ?24 hour supervision/assistance;Outpatient SLP;Home Health SLP  ?Rationale for SLP Follow Up: Maximize functional communication;Reduce caregiver burden  ? ?Equipment: N/A  ? ?Reasons for discharge: Discharged from hospital  ? ?Patient/Family Agrees with Progress Made and Goals Achieved: Yes  ? ? ?Ido Wollman A Eilidh Marcano ?12/11/2021, 12:00 PM ? ?

## 2021-12-11 NOTE — Progress Notes (Signed)
?                                                       PROGRESS NOTE ? ? ?Subjective/Complaints: ?No new complaints this morning.  ?Itching much improved ?Had a BM today ?Working with SLP ? ?ROS: +pseudobulbar affect,+itchiness upper back- improved, +aphasia, +anxiety as per daughter ? ? ?Objective: ?  ?No results found. ? ?No results for input(s): WBC, HGB, HCT, PLT in the last 72 hours. ? ?No results for input(s): NA, K, CL, CO2, GLUCOSE, BUN, CREATININE, CALCIUM in the last 72 hours. ? ? ?Intake/Output Summary (Last 24 hours) at 12/11/2021 1158 ?Last data filed at 12/11/2021 9604 ?Gross per 24 hour  ?Intake 318 ml  ?Output --  ?Net 318 ml  ?  ? ?  ? ?Physical Exam: ?Vital Signs ?Blood pressure (!) 138/113, pulse 71, temperature 98.9 ?F (37.2 ?C), resp. rate 19, height 5\' 5"  (1.651 m), weight 103.2 kg, SpO2 97 %. ?Gen: no distress, normal appearing, BMI 37.86 ?HEENT: oral mucosa pink and moist, NCAT ?Cardio: Reg rate ?Chest: normal effort, normal rate of breathing ?Abd: soft, non-distended ?Ext: no edema ?Musculoskeletal:  ?   Cervical back: Normal range of motion.  ?   Comments: No edema or tenderness in extremities  ?Skin: ?   General: Skin is warm and dry. Open lesions on upper back from scratching ?Neurological:  ?   Mental Status: She is alert.  ?   Comments: Alert ?Motor: RUE/RLE: 4 --4/5 proximal distal ?LUE/LE: 4+/5 proximal to distal ?Expressive aphasia >receptive aphasia  ?Ambuating supervision with RW ?Psychiatric:  ?   Comments: Limited due to aphasia, +pseudobulbar affect ? ? ?Assessment/Plan: ?1. Functional deficits which require 3+ hours per day of interdisciplinary therapy in a comprehensive inpatient rehab setting. ?Physiatrist is providing close team supervision and 24 hour management of active medical problems listed below. ?Physiatrist and rehab team continue to assess barriers to discharge/monitor patient progress toward functional and medical goals ? ?Care Tool: ? ?Bathing ?   ?Body parts  bathed by patient: Right arm, Left arm, Chest, Right lower leg, Left lower leg, Face, Abdomen, Front perineal area, Buttocks, Right upper leg, Left upper leg  ?   ?  ?  ?Bathing assist Assist Level: Minimal Assistance - Patient > 75% ?  ?  ?Upper Body Dressing/Undressing ?Upper body dressing   ?What is the patient wearing?: Pull over shirt ?   ?Upper body assist Assist Level: Contact Guard/Touching assist ?   ?Lower Body Dressing/Undressing ?Lower body dressing ? ? ?   ?What is the patient wearing?: Pants, Incontinence brief ? ?  ? ?Lower body assist Assist for lower body dressing: Minimal Assistance - Patient > 75% ?   ? ?Toileting ?Toileting    ?Toileting assist Assist for toileting: Minimal Assistance - Patient > 75% ?  ?  ?Transfers ?Chair/bed transfer ? ?Transfers assist ?   ? ?Chair/bed transfer assist level: Minimal Assistance - Patient > 75% ?  ?  ?Locomotion ?Ambulation ? ? ?Ambulation assist ? ?   ? ?Assist level: Contact Guard/Touching assist ?Assistive device: Walker-rolling ?Max distance: 50 ft  ? ?Walk 10 feet activity ? ? ?Assist ?   ? ?Assist level: Contact Guard/Touching assist ?Assistive device: Walker-rolling  ? ?Walk 50 feet activity ? ? ?Assist   ? ?Assist level: Contact  Guard/Touching assist ?Assistive device: Walker-rolling  ? ? ?Walk 150 feet activity ? ? ?Assist Walk 150 feet activity did not occur: Safety/medical concerns ? ?  ?  ?  ? ?Walk 10 feet on uneven surface  ?activity ? ? ?Assist Walk 10 feet on uneven surfaces activity did not occur: Safety/medical concerns ? ? ?  ?   ? ?Wheelchair ? ? ? ? ?Assist Is the patient using a wheelchair?: Yes ?Type of Wheelchair: Manual ?  ? ?Wheelchair assist level: Contact Guard/Touching assist, Supervision/Verbal cueing ?Max wheelchair distance: 36 ft  ? ? ?Wheelchair 50 feet with 2 turns activity ? ? ? ?Assist ? ?  ?  ? ? ?Assist Level: Moderate Assistance - Patient 50 - 74%  ? ?Wheelchair 150 feet activity  ? ? ? ?Assist ?   ? ? ?Assist Level:  Total Assistance - Patient < 25%  ? ?Blood pressure (!) 138/113, pulse 71, temperature 98.9 ?F (37.2 ?C), resp. rate 19, height 5\' 5"  (1.651 m), weight 103.2 kg, SpO2 97 %. ? ? ? Medical Problem List and Plan: ?1. Functional deficits with mobility, transfers, cognition, endurance secondary to acute left MCA distribution infarct . ?            -patient may not shower ?            -ELOS/Goals: 6 days/supervision ?           d/c home tomorrow ? HFU scheduled ? Daughter updated by SW ?2.  Impaired mobility: ambulating >150 feet: d/c Lovenox ?            -antiplatelet therapy: Aspirin and Plavix x3 months, then Plavix alone ?3. Pain Management: As needed medications ?4.  Mood: Team support ?            -antipsychotic agents:N/A. ?5. Neuropsych: This patient is not capable of making decisions on her own behalf. ?6. Skin/Wound Care: Routine skin care ?7. Fluids/Electrolytes/Nutrition: Routine I's/Os.   ?8.  Hyperlipidemia: continue Zetia 10 ?9.  Depression: continue Prozac 20 ?10.  Sleep disturbance: continue Melatonin 10 ?11.  Essential hypertension: add Norvasc 5mg  back.  ?12.  Diabetes mellitus type 2 ?          Continue metformin 500mg  daily.  ? D/c CBG checks ? D/c ISS.  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 Discussed with team.  ?14. 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 Hypoalbuminemia: discussed importance of choosing high protein foods ?16. Rash on upper back: continue Sarna lotion. Asked nursing to cover open areas with bandaides.  ?17. Obesity: BMI 37.86: provide dietary education ? ?LOS: ?5 days ?A FACE TO FACE EVALUATION WAS PERFORMED ? ?[name] P Hazelee Harbold ?12/11/2021, 11:59 AM  ? ?  ?

## 2021-12-11 NOTE — Plan of Care (Signed)
?  Problem: RH Comprehension Communication ?Goal: LTG Patient will comprehend basic/complex auditory (SLP) ?Description: LTG: Patient will comprehend basic/complex auditory information with cues (SLP). ?Outcome: Completed/Met ?Flowsheets (Taken 12/07/2021 2041 by Patty Sermons, CCC-SLP) ?LTG: Patient will comprehend: Basic auditory information ?LTG: Patient will comprehend auditory information with cueing (SLP): Moderate Assistance - Patient 50 - 74% ?  ?Problem: RH Expression Communication ?Goal: LTG Patient will express needs/wants via multi-modal(SLP) ?Description: LTG:  Patient will express needs/wants via multi-modal communication (gestures/written, etc) with cues (SLP) ?Outcome: Completed/Met ?Flowsheets (Taken 12/07/2021 2041 by Patty Sermons, CCC-SLP) ?LTG: Patient will express needs/wants via multimodal communication (gestures/written, etc) with cueing (SLP): Moderate Assistance - Patient 50 - 74% ?Goal: LTG Patient will verbally express basic/complex needs(SLP) ?Description: LTG:  Patient will verbally express basic/complex needs, wants or ideas with cues  (SLP) ?Outcome: Completed/Met ?Flowsheets (Taken 12/11/2021 1220) ?LTG: Patient will verbally express basic/complex needs, wants or ideas (SLP): Maximal Assistance - Patient 25 - 49% ?  ?

## 2021-12-12 LAB — GLUCOSE, CAPILLARY: Glucose-Capillary: 103 mg/dL — ABNORMAL HIGH (ref 70–99)

## 2021-12-12 NOTE — Progress Notes (Signed)
INPATIENT REHABILITATION DISCHARGE NOTE ? ? ?Discharge instructions by: sandra PA-c ? ?Verbalized understanding: family  ? ?Skin care/Wound care healing? Intact  ? ?Pain: none ? ?IV's: removed ? ?Tubes/Drains: none  ? ?O2: room air  ? ?Safety instructions: given to family  ? ?Patient belongings: home medications returned to family patient belongings sent with family  ? ?Discharged TF:TDDU ? ?Discharged via: wheelchair  ? ?Notes: ? ? ? ? ? ? ?  ?

## 2021-12-12 NOTE — Progress Notes (Signed)
?                                                       PROGRESS NOTE ? ? ?Subjective/Complaints: Patient has no new complaints this morning.  She is not complaining of itching today.  Last BM on 12/11/21.  SLP working with patient. ? ? ?ROS: +pseudobulbar affect (appropriate affect at time of exam), +aphasia, + anxiety (per daughter), + itchiness upper back (better today) ? ? ?Objective: ?  ?No results found. ? ?No results for input(s): WBC, HGB, HCT, PLT in the last 72 hours. ? ?No results for input(s): NA, K, CL, CO2, GLUCOSE, BUN, CREATININE, CALCIUM in the last 72 hours. ? ? ?Intake/Output Summary (Last 24 hours) at 12/12/2021 0901 ?Last data filed at 12/12/2021 0700 ?Gross per 24 hour  ?Intake 498 ml  ?Output --  ?Net 498 ml  ?  ? ?  ? ?Physical Exam: ?Vital Signs ?Blood pressure (!) 140/104, pulse 68, temperature 97.7 ?F (36.5 ?C), temperature source Oral, resp. rate 17, height 5\' 5"  (1.651 m), weight 103.2 kg, SpO2 96 %. ?Gen: today not in distress, normal appearing, BMI 37.86 ?HEENT: NCAT, oral mucosa pink and moist ?Cardio: Reg rate, rhythm ?Chest: normal effort, normal rate of breathing ?Abd: soft, non-distended, non-tender to palpation ?Ext: no edema present ?Musculoskeletal:  ?   Cervical back: Normal range of motion.  ?   Comments: No tenderness or edema in extremities  ?Skin: ?   General: Skin is warm and dry. Open lesions on upper back from scratching ?Neurological:  ?   Mental Status: She is alert.  ?   Comments: Alert ?Motor: RUE/RLE: 4 --4/5 proximal->distal ?LUE/LE: 4+/5 proximal->distal ?Expressive aphasia >receptive aphasia  ?Ambuating supervision well with RW ?Psychiatric:  ?   Comments: Limited evaluation due to aphasia, +pseudobulbar affect ? ? ?Assessment/Plan: ?1. Functional deficits which require 3+ hours per day of interdisciplinary therapy in a comprehensive inpatient rehab setting. ?Physiatrist is providing close team supervision and 24 hour management of active medical problems listed  below. ?Physiatrist and rehab team continue to assess barriers to discharge/monitor patient progress toward functional and medical goals ? ?Care Tool: ? ?Bathing ?   ?Body parts bathed by patient: Right arm, Left arm, Chest, Right lower leg, Left lower leg, Face, Abdomen, Front perineal area, Buttocks, Right upper leg, Left upper leg  ?   ?  ?  ?Bathing assist Assist Level: Contact Guard/Touching assist ?  ?  ?Upper Body Dressing/Undressing ?Upper body dressing   ?What is the patient wearing?: Pull over shirt ?   ?Upper body assist Assist Level: Supervision/Verbal cueing ?   ?Lower Body Dressing/Undressing ?Lower body dressing ? ? ?   ?What is the patient wearing?: Pants, Incontinence brief ? ?  ? ?Lower body assist Assist for lower body dressing: Contact Guard/Touching assist ?   ? ?Toileting ?Toileting    ?Toileting assist Assist for toileting: Supervision/Verbal cueing ?  ?  ?Transfers ?Chair/bed transfer ? ?Transfers assist ?   ? ?Chair/bed transfer assist level: Supervision/Verbal cueing ?  ?  ?Locomotion ?Ambulation ? ? ?Ambulation assist ? ?   ? ?Assist level: Supervision/Verbal cueing ?Assistive device: Walker-rolling ?Max distance: 155ft  ? ?Walk 10 feet activity ? ? ?Assist ?   ? ?Assist level: Supervision/Verbal cueing ?Assistive device: Walker-rolling  ? ?Walk 50 feet  activity ? ? ?Assist   ? ?Assist level: Supervision/Verbal cueing ?Assistive device: Walker-rolling  ? ? ?Walk 150 feet activity ? ? ?Assist Walk 150 feet activity did not occur: Safety/medical concerns ? ?Assist level: Supervision/Verbal cueing ?Assistive device: Walker-rolling ?  ? ?Walk 10 feet on uneven surface  ?activity ? ? ?Assist Walk 10 feet on uneven surfaces activity did not occur: Safety/medical concerns ? ? ?Assist level: Supervision/Verbal cueing ?Assistive device: Walker-rolling  ? ?Wheelchair ? ? ? ? ?Assist Is the patient using a wheelchair?: No ?Type of Wheelchair: Manual ?Wheelchair activity did not occur:  N/A ? ?Wheelchair assist level: Contact Guard/Touching assist, Supervision/Verbal cueing ?Max wheelchair distance: 36 ft  ? ? ?Wheelchair 50 feet with 2 turns activity ? ? ? ?Assist ? ?  ?Wheelchair 50 feet with 2 turns activity did not occur: N/A ? ? ?Assist Level: Moderate Assistance - Patient 50 - 74%  ? ?Wheelchair 150 feet activity  ? ? ? ?Assist ? Wheelchair 150 feet activity did not occur: N/A ? ? ?Assist Level: Total Assistance - Patient < 25%  ? ?Blood pressure (!) 140/104, pulse 68, temperature 97.7 ?F (36.5 ?C), temperature source Oral, resp. rate 17, height 5\' 5"  (1.651 m), weight 103.2 kg, SpO2 96 %. ? ? ? Medical Problem List and Plan: ?1. Functional deficits with mobility, transfers, cognition, endurance secondary to acute left MCA distribution infarct . ?            -patient may not shower ?            -ELOS/Goals: 6 days/supervision ?           d/c home tomorrow ? HFU scheduled ? Daughter updated yesterday by SW ?2.  Impaired mobility: ambulating >150 feet: d/c Lovenox ?            -antiplatelet therapy: Aspirin and Plavix x3 months, then Plavix alone ?3. Pain Management: As needed medications ?4.  Mood: Team support ?            -antipsychotic agents:N/A. ?5. Neuropsych: This patient is not capable of making decisions on her own behalf. ?6. Skin/Wound Care: Routine skin care ?7. Fluids/Electrolytes/Nutrition: Routine I's/Os.   ?8.  Hyperlipidemia: continue Zetia 10 ?9.  Depression: continue Prozac 20 ?10.  Sleep disturbance: continue Melatonin 10 ?11.  Essential hypertension: add Norvasc 5mg  back.  ?. ?12.  Diabetes mellitus type 2 ?          Continue metformin 500mg  daily.  ? D/c CBG checks ? D/c ISS.  ?-4/22 CBG's currently controlled. ? ?CBG (last 3)  ?Recent Labs  ?  12/09/21 ?1114 12/12/21 ?0619  ?GLUCAP 113* 103*  ?  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  ?Already discussed by Dr. 5/22. ?-4/22 No issues with  affect today. ?14. 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 Hypoalbuminemia:  ?-4/16 Albumin 3.3, continue high protein foods ?16. Rash on upper back: continue Sarna lotion. Asked nursing to cover open areas with bandaides.  ?-4/22 Currently improving with Sarna cream ?17. Obesity: BMI 37.86: provide dietary education ? ?LOS: ?6 days ?A FACE TO FACE EVALUATION WAS PERFORMED ? ?5/22 ?12/12/2021, 9:01 AM  ? ?  ?

## 2021-12-12 NOTE — Progress Notes (Signed)
Inpatient Rehabilitation Discharge Medication Review by a Pharmacist ? ?A complete drug regimen review was completed for this patient to identify any potential clinically significant medication issues. ? ?High Risk Drug Classes Is patient taking? Indication by Medication  ?Antipsychotic No   ?Anticoagulant No   ?Antibiotic No   ?Opioid No   ?Antiplatelet Yes ASA/Plavix x 3 weeks f/b plavix alone (ASA end date 03/04/2022) - CVA  ?Hypoglycemics/insulin Yes Glucophage - T2DM  ?Vasoactive Medication Yes Norvasc - HTN  ?Chemotherapy No   ?Other Yes Zetia - HLD ?Prozac - Depression ?Melatonin - Sleep ?Atarax, Klonopin - Anxiety ?MIV - Supplementation ?Sodium chloride tablet - Sodium deficiency  ? ? ? ?Type of Medication Issue Identified Description of Issue Recommendation(s)  ?Drug Interaction(s) (clinically significant) ?    ?Duplicate Therapy ?    ?Allergy ?    ?No Medication Administration End Date ?    ?Incorrect Dose ?    ?Additional Drug Therapy Needed ?    ?Significant med changes from prior encounter (inform family/care partners about these prior to discharge).    ?Other ?    ? ? ?Clinically significant medication issues were identified that warrant physician communication and completion of prescribed/recommended actions by midnight of the next day:  No ? ?Time spent performing this drug regimen review (minutes):  30 ? ? ?Marcene Corning ?12/12/2021 7:42 AM ?

## 2021-12-14 NOTE — Telephone Encounter (Signed)
Transition Care Management Unsuccessful Follow-up Telephone Call ? ?Date of discharge and from where:  12/11/2021 [facility] ? ?Attempts:  2nd Attempt ? ?Reason for unsuccessful TCM follow-up call:  Unable to leave message Someone picks up twice and hangs up.  ? ?  ?

## 2021-12-16 ENCOUNTER — Telehealth: Payer: Self-pay | Admitting: *Deleted

## 2021-12-16 NOTE — Telephone Encounter (Signed)
Rebecca Barnes called after doing ST assessment and is requesting to add [facility] to go out for diabetic medication management and teaching. Approval given. ?

## 2021-12-18 ENCOUNTER — Encounter (HOSPITAL_BASED_OUTPATIENT_CLINIC_OR_DEPARTMENT_OTHER): Payer: Self-pay | Admitting: Obstetrics and Gynecology

## 2021-12-18 ENCOUNTER — Other Ambulatory Visit: Payer: Self-pay

## 2021-12-18 ENCOUNTER — Emergency Department (HOSPITAL_BASED_OUTPATIENT_CLINIC_OR_DEPARTMENT_OTHER)
Admission: EM | Admit: 2021-12-18 | Discharge: 2021-12-18 | Disposition: A | Discharge status: Home / Self Care | Payer: Medicare Other | Attending: Emergency Medicine | Admitting: Emergency Medicine

## 2021-12-18 DIAGNOSIS — Z7982 Long term (current) use of aspirin: Secondary | ICD-10-CM | POA: Insufficient documentation

## 2021-12-18 DIAGNOSIS — S0992XA Unspecified injury of nose, initial encounter: Secondary | ICD-10-CM | POA: Diagnosis present

## 2021-12-18 DIAGNOSIS — X58XXXA Exposure to other specified factors, initial encounter: Secondary | ICD-10-CM | POA: Diagnosis not present

## 2021-12-18 DIAGNOSIS — Z79899 Other long term (current) drug therapy: Secondary | ICD-10-CM | POA: Insufficient documentation

## 2021-12-18 DIAGNOSIS — S0031XA Abrasion of nose, initial encounter: Secondary | ICD-10-CM | POA: Insufficient documentation

## 2021-12-18 DIAGNOSIS — R040 Epistaxis: Secondary | ICD-10-CM | POA: Insufficient documentation

## 2021-12-18 MED ORDER — OXYMETAZOLINE HCL 0.05 % NA SOLN: NASAL | 0 refills | Status: DC

## 2021-12-18 MED ORDER — OXYMETAZOLINE HCL 0.05 % NA SOLN
1.0000 | Freq: Once | NASAL | Status: AC
Start: 2021-12-18 — End: 2021-12-18
  Administered 2021-12-18: 1 via NASAL
  Filled 2021-12-18: qty 30

## 2021-12-18 NOTE — ED Triage Notes (Signed)
Patient reports to the ER via POV for epistaxis. Patient states it started at 1715. Patient is dripping blood from the left nare. Patient recently had a stroke and is on bloodthinners.  ?

## 2021-12-18 NOTE — ED Notes (Signed)
Discharge paperwork given and understood. 

## 2021-12-18 NOTE — Discharge Instructions (Addendum)
Fortunately we are able to get your nosebleed under control with a little bit of direct pressure spray of medication.  If this happens again at home, please attempt to do direct pressure while leaning forward for least 20 minutes before you attempt to use the medication.  If you use this too frequently, it can cause worsening bleeding and symptoms ?

## 2021-12-19 NOTE — ED Provider Notes (Signed)
?[facility] ?Provider Note ? ? ?CSN: [id number] ?Arrival date & time: 12/18/21  1845 ? ?  ? ?History ? ?Chief Complaint  ?Patient presents with  ? Epistaxis  ? ? ?Rebecca Barnes is a 76 y.o. female who presents to the ED accompanied by her daughter for epistaxis that started about 1.5 hours prior to arrival.  Patient has recently been started on Plavix following a stroke on 12/02/2021 with residual aphasic symptoms, so Jorde of history provided by patient's daughter.  Patient has been having intermittent epistaxis since that he started the blood thinner however today bleeding was not controlled well with direct pressure.  She was also concerned with how copious the blood was coming out of her nose.  Patient denies weakness, fatigue.  No other complaints ? ? ?Epistaxis ? ?  ? ?Home Medications ?Prior to Admission medications   ?Medication Sig Start Date End Date Taking? Authorizing Provider  ?oxymetazoline (AFRIN NASAL SPRAY) 0.05 % nasal spray Spray 1 spray into affected nostril if bleeding is uncontrolled after 20 min of direct pressure 12/18/21  Yes Tonye Pearson, PA-C  ?acetaminophen (TYLENOL) 325 MG tablet Take 650 mg by mouth every 6 (six) hours as needed for mild pain or headache. 06/13/20   [provider]  ?amLODipine (NORVASC) 5 MG tablet TAKE 1 TABLET(5 MG) BY MOUTH DAILY ?Patient taking differently: Take 5 mg by mouth daily. 08/28/21   Ngetich, Dinah C, NP  ?aspirin EC 81 MG tablet Take 1 tablet (81 mg total) by mouth daily. Swallow whole. 04/09/21   [name], Monina C, NP  ?camphor-menthol (SARNA) lotion Apply topically as needed for itching. 12/11/21   Setzer, Edman Circle, PA-C  ?clonazePAM (KLONOPIN) 0.5 MG tablet Take 0.5 mg by mouth 2 (two) times daily as needed for anxiety. 08/25/21   [provider]  ?clopidogrel (PLAVIX) 75 MG tablet Take 1 tablet (75 mg total) by mouth daily. 12/11/21   Setzer, Edman Circle, PA-C  ?ezetimibe (ZETIA) 10 MG tablet Take 1 tablet  (10 mg total) by mouth daily. 12/11/21   Setzer, Edman Circle, PA-C  ?fenofibrate 54 MG tablet Take 1 tablet (54 mg total) by mouth daily. 12/12/21   Setzer, Edman Circle, PA-C  ?FLUoxetine (PROZAC) 20 MG capsule Take one capsule by mouth once daily. ?Patient taking differently: Take 20 mg by mouth daily. 07/13/21   Ngetich, Dinah C, NP  ?hydrOXYzine (ATARAX) 10 MG tablet Take 1 tablet (10 mg total) by mouth 3 (three) times daily as needed for anxiety. 12/11/21   Setzer, Edman Circle, PA-C  ?melatonin 10 MG TABS Take 10 mg by mouth at bedtime. 12/11/21   Setzer, Edman Circle, PA-C  ?metFORMIN (GLUCOPHAGE) 500 MG tablet Take 1 tablet (500 mg total) by mouth daily with breakfast. 12/12/21   Setzer, Edman Circle, PA-C  ?Multiple Vitamin (MULTI-VITAMIN) tablet Take 1 tablet by mouth daily. 04/09/21   [name], Monina C, NP  ?sodium chloride 1 g tablet TAKE 1 TABLET(1 GRAM) BY MOUTH THREE TIMES DAILY ?Patient taking differently: Take 1 g by mouth 3 (three) times daily with meals. 11/30/21   Ngetich, Nelda Bucks, NP  ?   ? ?Allergies    ?Januvia [sitagliptin], Statins, and Sulfa antibiotics   ? ?Review of Systems   ?Review of Systems  ?HENT:  Positive for nosebleeds.   ? ?Physical Exam ?Updated Vital Signs ?BP (!) 174/72 (BP Location: Right Arm)   Pulse 98   Temp 98.7 ?F (37.1 ?C)   Resp 16   SpO2  94%  ?Physical Exam ?Vitals and nursing note reviewed.  ?Constitutional:   ?   General: She is not in acute distress. ?   Appearance: She is not ill-appearing.  ?HENT:  ?   Head: Atraumatic.  ?   Comments: Small abrasion to the left anterior plexus, bleeding well controlled during my time of evaluation. ? ?Patient otherwise has dried blood across her face and chest. ?Eyes:  ?   Conjunctiva/sclera: Conjunctivae normal.  ?Cardiovascular:  ?   Rate and Rhythm: Normal rate and regular rhythm.  ?   Pulses: Normal pulses.  ?   Heart sounds: No murmur heard. ?Pulmonary:  ?   Effort: Pulmonary effort is normal. No respiratory distress.  ?   Breath sounds:  Normal breath sounds.  ?Abdominal:  ?   General: Abdomen is flat. There is no distension.  ?   Palpations: Abdomen is soft.  ?   Tenderness: There is no abdominal tenderness.  ?Musculoskeletal:     ?   General: Normal range of motion.  ?   Cervical back: Normal range of motion.  ?Skin: ?   General: Skin is warm and dry.  ?   Capillary Refill: Capillary refill takes less than 2 seconds.  ?Neurological:  ?   General: No focal deficit present.  ?   Mental Status: She is alert.  ?   Comments: No new focal deficits, aphasia noted  ?Psychiatric:     ?   Mood and Affect: Mood normal.  ? ? ?ED Results / Procedures / Treatments   ?Labs ?(all labs ordered are listed, but only abnormal results are displayed) ?Labs Reviewed - No data to display ? ?EKG ?None ? ?Radiology ?No results found. ? ?Procedures ?Procedures  ? ? ?Medications Ordered in ED ?Medications  ?oxymetazoline (AFRIN) 0.05 % nasal spray 1 spray (1 spray Each Nare Given 12/18/21 1903)  ? ? ?ED Course/ Medical Decision Making/ A&P ?  ?                        ?Medical Decision Making ?Risk ?OTC drugs. ? ? ?76 year old female in no acute distress, nontoxic-appearing presents to the ED for evaluation of epistaxis.  While in triage, RN had given patient directions for direct pressure as well as applying Afrin spray into the affected nare.  This is approximately 30 minutes prior to my evaluation at which point patient's bleeding has been stopped for over 25 minutes.  Vitals are normal.  Physical exam without acute findings.  No active bleeding in the left nare no blood draining down the back of her throat.  Intervention required at this time.  Since this has been a repeat issue for patient since she started her blood thinner, patient's daughter has requested a prescription of Afrin.  I discussed in detail that this medication should not be used regularly and should only be used as needed if bleeding cannot be controlled with 20+ minutes of direct pressure as it can cause  increased or refractory symptoms.  Patient's daughter expresses understanding and is amenable to plan.  Discharged home in good condition. ?Final Clinical Impression(s) / ED Diagnoses ?Final diagnoses:  ?Epistaxis  ? ? ?Rx / DC Orders ?ED Discharge Orders   ? ?      Ordered  ?  oxymetazoline (AFRIN NASAL SPRAY) 0.05 % nasal spray       ? 12/18/21 1952  ? ?  ?  ? ?  ? ? ?  ?Iline Oven,  Josefina Do, PA-C ?12/19/21 1748 ? ?  ?Gareth Morgan, MD ?12/19/21 2234 ? ?

## 2021-12-20 ENCOUNTER — Other Ambulatory Visit: Payer: Self-pay | Admitting: Physician Assistant

## 2022-01-03 ENCOUNTER — Other Ambulatory Visit: Payer: Self-pay | Admitting: Physician Assistant

## 2022-01-04 ENCOUNTER — Other Ambulatory Visit: Payer: Self-pay | Admitting: Family

## 2022-01-04 ENCOUNTER — Encounter: Payer: Self-pay | Admitting: Family

## 2022-01-04 ENCOUNTER — Ambulatory Visit (INDEPENDENT_AMBULATORY_CARE_PROVIDER_SITE_OTHER): Payer: Medicare Other | Admitting: Family

## 2022-01-04 VITALS — BP 130/80 | HR 98 | Temp 98.1°F | Resp 18 | Ht 65.0 in | Wt 221.2 lb

## 2022-01-04 DIAGNOSIS — I63412 Cerebral infarction due to embolism of left middle cerebral artery: Secondary | ICD-10-CM | POA: Diagnosis not present

## 2022-01-04 DIAGNOSIS — F411 Generalized anxiety disorder: Secondary | ICD-10-CM

## 2022-01-04 DIAGNOSIS — F339 Major depressive disorder, recurrent, unspecified: Secondary | ICD-10-CM

## 2022-01-04 MED ORDER — FLUOXETINE HCL 40 MG PO CAPS: ORAL_CAPSULE | ORAL | 1 refills | Status: DC

## 2022-01-04 MED ORDER — HYDROXYZINE HCL 10 MG PO TABS: 10.0000 mg | ORAL_TABLET | Freq: Three times a day (TID) | ORAL | 0 refills | Status: DC | PRN

## 2022-01-04 NOTE — Patient Instructions (Signed)
-   Increase Prozac from 20 mg tablet to 40 mg tablet one by mouth daily  ? ?- Hydroxyzine 10 mg tablet one by mouth every 8 hrs   ?

## 2022-01-04 NOTE — Progress Notes (Signed)
? ?Provider: Richarda Blade FNP-C ? ?Rupert Azzara, Donalee Citrin, NP ? ?Patient Care Team: ?Theran Vandergrift, Donalee Citrin, NP as PCP - General (Family Medicine) ? ?Extended Emergency Contact Information ?Primary Emergency Contact: Linz-GROSS,[name] ?Home Phone: [phone] ?Relation: Daughter ?Secondary Emergency Contact: Witzke,Lisa ?Mobile Phone: [phone] ?Relation: Daughter ? ?Code Status:  Full Code  ?Goals of care: Advanced Directive information ? ?  01/04/2022  ? 10:15 AM  ?Advanced Directives  ?Does Patient Have a Medical Advance Directive? Yes  ?Type of Estate agent of [place];Living will  ?Does patient want to make changes to medical advance directive? No - Patient declined  ?Copy of Healthcare Power of Attorney in Chart? No - copy requested  ? ? ? ?Chief Complaint  ?Patient presents with  ? Acute Visit  ?  Patient daughter wants to discuss medication changes BP pills, Antidepressant, etc.   ? ? ?HPI:  ?Pt is a 76 y.o. female seen today for an acute visit to discuss medication. She is here with her care giver states patient has been crying most of the time.spoke with patient's daughter [name] over the phone during visit.states patient has been depressed and more anxious.tend to cry all the time.currently on Prozac 20 mg tablet daily,Hydroxyzine 10 mg tablet twice daily PRN  and has Clonazepam 0.5 mg tablet twice daily PRN. Daughter gives her clonazepam once at bedtime since it tends to make her sleepy. She requests medication to be adjusted due to worsening symptoms. ?She denies any symptoms of urinary tract infection. ? ?She was seen in the ED 12/18/2021 for epistaxis.  She had nosebleed that could not stop for 25 minutes so daughter took her to the ED.  Afrin was applied and bleeding stopped.  She was sent home with Afrin nasal spray to use adjust as needed if she cannot control bleeding after 20 minutes of direct pressure. ? ?On chart review, she is also s/p hospital admission from 12/02/2021 to 12/06/2021  for Aphasia after she presented to the ED with 48 was history of difficult speaking.  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 Advised to follow up with Neurologist.  ?Her Norvasc and ARB were held but restarted on discharge.  ?Had hypokalemia but was replete.  ? ?Past Medical History:  ?Diagnosis Date  ? Anxiety   ? Asthma   ? Diabetes mellitus without complication (HCC)   ? Hypertension   ? Stroke [facility])   ? ?Past Surgical History:  ?Procedure Laterality Date  ? arm fracture    ? arm surgery  ? CHOLECYSTECTOMY    ? ? ?Allergies  ?Allergen Reactions  ? Januvia [Sitagliptin] Other (See Comments)  ?  dizziness  ? Statins Other (See Comments)  ?  Hospitalized on 2 occasions after trying statins. Patient became dizzy and increased fall risk  ? Sulfa Antibiotics Other (See Comments)  ?  Childhood Allergy   ? ? ?Outpatient Encounter Medications as of 01/04/2022  ?Medication Sig  ? acetaminophen (TYLENOL) 325 MG tablet Take 650 mg by mouth every 6 (six) hours as needed for mild pain or headache.  ? amLODipine (NORVASC) 5 MG tablet TAKE 1 TABLET(5 MG) BY MOUTH DAILY  ? aspirin EC 81 MG  tablet Take 1 tablet (81 mg total) by mouth daily. Swallow whole.  ? camphor-menthol (SARNA) lotion Apply topically as needed for itching.  ? clonazePAM (KLONOPIN) 0.5 MG tablet Take 0.5 mg by mouth 2 (two) times daily as needed for anxiety.  ? clopidogrel (PLAVIX) 75 MG tablet Take 1 tablet (75 mg total) by mouth daily.  ? ezetimibe (ZETIA) 10 MG tablet Take 1 tablet (10 mg total) by mouth daily.  ? fenofibrate 54 MG tablet Take 1 tablet (54 mg total) by mouth daily.  ?  FLUoxetine (PROZAC) 20 MG capsule Take one capsule by mouth once daily.  ? hydrOXYzine (ATARAX) 10 MG tablet TAKE 1 TABLET(10 MG) BY MOUTH THREE TIMES DAILY AS NEEDED FOR ANXIETY  ? losartan (COZAAR) 100 MG tablet Take 100 mg by mouth daily.  ? melatonin 10 MG TABS Take 10 mg by mouth at bedtime.  ? metFORMIN (GLUCOPHAGE) 500 MG tablet Take 1 tablet (500 mg total) by mouth daily with breakfast.  ? Multiple Vitamin (MULTI-VITAMIN) tablet Take 1 tablet by mouth daily.  ? oxymetazoline (AFRIN NASAL SPRAY) 0.05 % nasal spray Spray 1 spray into affected nostril if bleeding is uncontrolled after 20 min of direct pressure  ? sodium chloride 1 g tablet TAKE 1 TABLET(1 GRAM) BY MOUTH THREE TIMES DAILY  ? ?No facility-administered encounter medications on file as of 01/04/2022.  ? ? ?Review of Systems  ?Constitutional:  Negative for appetite change, chills, fatigue, fever and unexpected weight change.  ?HENT:  Negative for congestion, dental problem, ear discharge, ear pain, facial swelling, hearing loss, nosebleeds, postnasal drip, rhinorrhea, sinus pressure, sinus pain, sneezing, sore throat, tinnitus and trouble swallowing.   ?Eyes:  Negative for pain, discharge, redness, itching and visual disturbance.  ?Respiratory:  Negative for cough, chest tightness, shortness of breath and wheezing.   ?Cardiovascular:  Negative for chest pain, palpitations and leg swelling.  ?Gastrointestinal:  Negative for abdominal distention, abdominal pain, blood in stool, constipation, diarrhea, nausea and vomiting.  ?Endocrine: Negative for cold intolerance, heat intolerance, polydipsia, polyphagia and polyuria.  ?Genitourinary:  Negative for difficulty urinating, dysuria, flank pain, frequency and urgency.  ?Musculoskeletal:  Positive for gait problem. Negative for arthralgias, back pain, joint swelling, myalgias, neck pain and neck stiffness.  ?Skin:  Negative for color change, pallor, rash and wound.  ?Neurological:  Positive for speech  difficulty. Negative for dizziness, syncope, weakness, light-headedness, numbness and headaches.  ?     Working with speech therapy daughter request extension of ST which ends on 01/18/2022 feels like patient is doing better with ST but needs more time.   ?Hematological:  Does not bruise/bleed easily.  ?Psychiatric/Behavioral:  Negative for agitation, behavioral problems, confusion, hallucinations, self-injury, sleep disturbance and suicidal ideas. The patient is nervous/anxious.   ? ?Immunization History  ?Administered Date(s) Administered  ? Fluad Quad(high Dose 65+) 06/06/2019  ? Influenza-Unspecified 04/16/2017, 05/08/2020  ? Janssen (J&J) SARS-COV-2 Vaccination 11/04/2019  ? PFIZER(Purple Top)SARS-COV-2 Vaccination 07/28/2021  ? Pneumococcal Conjugate-13 09/17/2016  ? Pneumococcal Polysaccharide-23 02/11/2009, 07/06/2018  ? Tdap 09/17/2016  ? ?Pertinent  Health Maintenance Due  ?Topic Date Due  ? URINE MICROALBUMIN  Never done  ? FOOT EXAM  09/17/2020  ? OPHTHALMOLOGY EXAM  04/16/2021  ? INFLUENZA VACCINE  03/23/2022  ? HEMOGLOBIN A1C  06/05/2022  ? DEXA SCAN  Completed  ? ? ?  12/11/2021  ?  8:48 AM 12/11/2021  ?  9:00 PM 12/12/2021  ?  9:02 AM 12/18/2021  ?  6:56 PM 01/04/2022  ?  10:15 AM  ?Fall Risk  ?Falls in the past year?     0  ?Was there an injury with Fall?     0  ?Fall Risk Category Calculator     0  ?Fall Risk Category     Low  ?Patient Fall Risk Level High fall risk High fall risk High fall risk High fall risk Low fall risk  ?Patient at Risk for Falls Due to     No Fall Risks  ?Fall risk Follow up     Falls evaluation completed  ? ?Functional Status Survey: ?  ? ?Vitals:  ? 01/04/22 1003  ?BP: 130/80  ?Pulse: 98  ?Resp: 18  ?Temp: 98.1 ?F (36.7 ?C)  ?SpO2: 98%  ?Weight: 221 lb 3.2 oz (100.3 kg)  ?Height: 5\' 5"  (1.651 m)  ? ?Body mass index is 36.81 kg/m? ?Physical Exam ?Vitals reviewed.  ?Constitutional:   ?   General: She is not in acute distress. ?   Appearance: Normal appearance. She is obese. She is  not ill-appearing or diaphoretic.  ?HENT:  ?   Head: Normocephalic.  ?   Right Ear: Tympanic membrane, ear canal and external ear normal. There is no impacted cerumen.  ?   Left Ear: Tympanic membrane, ear canal

## 2022-01-06 ENCOUNTER — Other Ambulatory Visit: Payer: Self-pay | Admitting: Physician Assistant

## 2022-01-12 ENCOUNTER — Other Ambulatory Visit: Payer: Self-pay | Admitting: Physician Assistant

## 2022-01-13 ENCOUNTER — Other Ambulatory Visit: Payer: Self-pay | Admitting: Family

## 2022-01-17 ENCOUNTER — Other Ambulatory Visit: Payer: Self-pay | Admitting: Family

## 2022-01-17 DIAGNOSIS — F411 Generalized anxiety disorder: Secondary | ICD-10-CM

## 2022-01-19 NOTE — Telephone Encounter (Signed)
Patient has request refill on medication Hydroxyzine. Patient last refill dated 01/04/2022. Patient was given zero refills. I'm unsure if this is an ongoing medication for patient. Medication pend and sent to PCP Ngetich, Donalee Citrin, NP for approval.

## 2022-01-20 ENCOUNTER — Other Ambulatory Visit: Payer: Self-pay | Admitting: Family

## 2022-01-20 DIAGNOSIS — E871 Hypo-osmolality and hyponatremia: Secondary | ICD-10-CM

## 2022-02-01 ENCOUNTER — Other Ambulatory Visit: Payer: Self-pay

## 2022-02-01 ENCOUNTER — Encounter (HOSPITAL_COMMUNITY): Payer: Self-pay

## 2022-02-01 ENCOUNTER — Inpatient Hospital Stay (HOSPITAL_COMMUNITY)
Admission: EM | Admit: 2022-02-01 | Discharge: 2022-02-09 | DRG: 521 | Disposition: A | Discharge status: Skilled Nursing Facility | Payer: Medicare Other | Attending: Internal Medicine | Admitting: Internal Medicine

## 2022-02-01 ENCOUNTER — Inpatient Hospital Stay (HOSPITAL_COMMUNITY): Payer: Medicare Other

## 2022-02-01 ENCOUNTER — Inpatient Hospital Stay (HOSPITAL_COMMUNITY): Payer: Medicare Other | Admitting: Anesthesiology

## 2022-02-01 ENCOUNTER — Ambulatory Visit: Payer: Self-pay | Admitting: Student

## 2022-02-01 ENCOUNTER — Emergency Department (HOSPITAL_COMMUNITY): Payer: Medicare Other

## 2022-02-01 DIAGNOSIS — Z888 Allergy status to other drugs, medicaments and biological substances status: Secondary | ICD-10-CM | POA: Diagnosis not present

## 2022-02-01 DIAGNOSIS — Z79899 Other long term (current) drug therapy: Secondary | ICD-10-CM | POA: Diagnosis not present

## 2022-02-01 DIAGNOSIS — Z7984 Long term (current) use of oral hypoglycemic drugs: Secondary | ICD-10-CM | POA: Diagnosis not present

## 2022-02-01 DIAGNOSIS — K5900 Constipation, unspecified: Secondary | ICD-10-CM | POA: Diagnosis not present

## 2022-02-01 DIAGNOSIS — N179 Acute kidney failure, unspecified: Secondary | ICD-10-CM | POA: Diagnosis present

## 2022-02-01 DIAGNOSIS — Z7982 Long term (current) use of aspirin: Secondary | ICD-10-CM

## 2022-02-01 DIAGNOSIS — S72009A Fracture of unspecified part of neck of unspecified femur, initial encounter for closed fracture: Secondary | ICD-10-CM

## 2022-02-01 DIAGNOSIS — Z9049 Acquired absence of other specified parts of digestive tract: Secondary | ICD-10-CM

## 2022-02-01 DIAGNOSIS — G9341 Metabolic encephalopathy: Secondary | ICD-10-CM | POA: Diagnosis present

## 2022-02-01 DIAGNOSIS — I119 Hypertensive heart disease without heart failure: Secondary | ICD-10-CM | POA: Diagnosis present

## 2022-02-01 DIAGNOSIS — F419 Anxiety disorder, unspecified: Secondary | ICD-10-CM | POA: Diagnosis present

## 2022-02-01 DIAGNOSIS — Z7902 Long term (current) use of antithrombotics/antiplatelets: Secondary | ICD-10-CM | POA: Diagnosis not present

## 2022-02-01 DIAGNOSIS — R509 Fever, unspecified: Secondary | ICD-10-CM

## 2022-02-01 DIAGNOSIS — W1830XA Fall on same level, unspecified, initial encounter: Secondary | ICD-10-CM | POA: Diagnosis present

## 2022-02-01 DIAGNOSIS — Z713 Dietary counseling and surveillance: Secondary | ICD-10-CM

## 2022-02-01 DIAGNOSIS — F32A Depression, unspecified: Secondary | ICD-10-CM | POA: Diagnosis present

## 2022-02-01 DIAGNOSIS — E119 Type 2 diabetes mellitus without complications: Secondary | ICD-10-CM | POA: Diagnosis not present

## 2022-02-01 DIAGNOSIS — S72001A Fracture of unspecified part of neck of right femur, initial encounter for closed fracture: Secondary | ICD-10-CM | POA: Diagnosis not present

## 2022-02-01 DIAGNOSIS — Z20822 Contact with and (suspected) exposure to covid-19: Secondary | ICD-10-CM | POA: Diagnosis present

## 2022-02-01 DIAGNOSIS — E785 Hyperlipidemia, unspecified: Secondary | ICD-10-CM | POA: Diagnosis present

## 2022-02-01 DIAGNOSIS — Z882 Allergy status to sulfonamides status: Secondary | ICD-10-CM | POA: Diagnosis not present

## 2022-02-01 DIAGNOSIS — F418 Other specified anxiety disorders: Secondary | ICD-10-CM

## 2022-02-01 DIAGNOSIS — I639 Cerebral infarction, unspecified: Secondary | ICD-10-CM | POA: Diagnosis present

## 2022-02-01 DIAGNOSIS — R4701 Aphasia: Secondary | ICD-10-CM | POA: Diagnosis not present

## 2022-02-01 DIAGNOSIS — M25751 Osteophyte, right hip: Secondary | ICD-10-CM | POA: Diagnosis present

## 2022-02-01 DIAGNOSIS — Z6837 Body mass index (BMI) 37.0-37.9, adult: Secondary | ICD-10-CM

## 2022-02-01 DIAGNOSIS — S72011A Unspecified intracapsular fracture of right femur, initial encounter for closed fracture: Principal | ICD-10-CM | POA: Diagnosis present

## 2022-02-01 DIAGNOSIS — I10 Essential (primary) hypertension: Secondary | ICD-10-CM

## 2022-02-01 DIAGNOSIS — I69320 Aphasia following cerebral infarction: Secondary | ICD-10-CM | POA: Diagnosis not present

## 2022-02-01 DIAGNOSIS — Z66 Do not resuscitate: Secondary | ICD-10-CM

## 2022-02-01 DIAGNOSIS — E1151 Type 2 diabetes mellitus with diabetic peripheral angiopathy without gangrene: Secondary | ICD-10-CM | POA: Diagnosis present

## 2022-02-01 DIAGNOSIS — R339 Retention of urine, unspecified: Secondary | ICD-10-CM | POA: Diagnosis not present

## 2022-02-01 DIAGNOSIS — E669 Obesity, unspecified: Secondary | ICD-10-CM | POA: Diagnosis present

## 2022-02-01 DIAGNOSIS — Z8673 Personal history of transient ischemic attack (TIA), and cerebral infarction without residual deficits: Secondary | ICD-10-CM | POA: Diagnosis not present

## 2022-02-01 DIAGNOSIS — R451 Restlessness and agitation: Secondary | ICD-10-CM | POA: Diagnosis not present

## 2022-02-01 DIAGNOSIS — D72829 Elevated white blood cell count, unspecified: Secondary | ICD-10-CM | POA: Diagnosis present

## 2022-02-01 DIAGNOSIS — Z781 Physical restraint status: Secondary | ICD-10-CM

## 2022-02-01 DIAGNOSIS — W19XXXA Unspecified fall, initial encounter: Secondary | ICD-10-CM

## 2022-02-01 LAB — TYPE AND SCREEN
ABO/RH(D): O NEG
Antibody Screen: NEGATIVE

## 2022-02-01 LAB — CBC WITH DIFFERENTIAL/PLATELET
Abs Immature Granulocytes: 0.07 K/uL (ref 0.00–0.07)
Basophils Absolute: 0 K/uL (ref 0.0–0.1)
Basophils Relative: 0 %
Eosinophils Absolute: 0 K/uL (ref 0.0–0.5)
Eosinophils Relative: 0 %
HCT: 41.2 % (ref 36.0–46.0)
Hemoglobin: 13.6 g/dL (ref 12.0–15.0)
Immature Granulocytes: 1 %
Lymphocytes Relative: 8 %
Lymphs Abs: 1 K/uL (ref 0.7–4.0)
MCH: 26.8 pg (ref 26.0–34.0)
MCHC: 33 g/dL (ref 30.0–36.0)
MCV: 81.1 fL (ref 80.0–100.0)
Monocytes Absolute: 1.1 K/uL — ABNORMAL HIGH (ref 0.1–1.0)
Monocytes Relative: 9 %
Neutro Abs: 10.3 K/uL — ABNORMAL HIGH (ref 1.7–7.7)
Neutrophils Relative %: 82 %
Platelets: 388 K/uL (ref 150–400)
RBC: 5.08 MIL/uL (ref 3.87–5.11)
RDW: 14 % (ref 11.5–15.5)
WBC: 12.5 K/uL — ABNORMAL HIGH (ref 4.0–10.5)
nRBC: 0 % (ref 0.0–0.2)

## 2022-02-01 LAB — PROTIME-INR
INR: 1.1 (ref 0.8–1.2)
Prothrombin Time: 13.7 seconds (ref 11.4–15.2)

## 2022-02-01 LAB — COMPREHENSIVE METABOLIC PANEL WITH GFR
ALT: 13 U/L (ref 0–44)
AST: 18 U/L (ref 15–41)
Albumin: 3.7 g/dL (ref 3.5–5.0)
Alkaline Phosphatase: 80 U/L (ref 38–126)
Anion gap: 12 (ref 5–15)
BUN: 10 mg/dL (ref 8–23)
CO2: 22 mmol/L (ref 22–32)
Calcium: 9.4 mg/dL (ref 8.9–10.3)
Chloride: 101 mmol/L (ref 98–111)
Creatinine, Ser: 0.6 mg/dL (ref 0.44–1.00)
GFR, Estimated: >60 mL/min
Glucose, Bld: 147 mg/dL — ABNORMAL HIGH (ref 70–99)
Potassium: 3.6 mmol/L (ref 3.5–5.1)
Sodium: 135 mmol/L (ref 135–145)
Total Bilirubin: 1 mg/dL (ref 0.3–1.2)
Total Protein: 7.1 g/dL (ref 6.5–8.1)

## 2022-02-01 LAB — URINALYSIS, ROUTINE W REFLEX MICROSCOPIC
Bilirubin Urine: NEGATIVE
Glucose, UA: NEGATIVE mg/dL
Hgb urine dipstick: NEGATIVE
Ketones, ur: NEGATIVE mg/dL
Leukocytes,Ua: NEGATIVE
Nitrite: NEGATIVE
Protein, ur: NEGATIVE mg/dL
Specific Gravity, Urine: 1.015 (ref 1.005–1.030)
pH: 7 (ref 5.0–8.0)

## 2022-02-01 LAB — CBG MONITORING, ED
Glucose-Capillary: 124 mg/dL — ABNORMAL HIGH (ref 70–99)
Glucose-Capillary: 125 mg/dL — ABNORMAL HIGH (ref 70–99)

## 2022-02-01 LAB — GLUCOSE, CAPILLARY: Glucose-Capillary: 149 mg/dL — ABNORMAL HIGH (ref 70–99)

## 2022-02-01 IMAGING — CT CT CERVICAL SPINE W/O CM
3 of 4 series · 13 of 33 positions shown, 16 images · non-contrast
Comparison: Head CT [DATE]. Brain MRI [DATE]. CT
angiography of the neck [DATE].

CLINICAL DATA: 75-year-old female with history of minor head
trauma.



[Series 4: c_spine 2.0 orthogonals · axial · 0.21mm/px · z∈[-258,-137]mm · 5 of 92 slices shown, 7 images]
[im 16/92  soft-tissue]
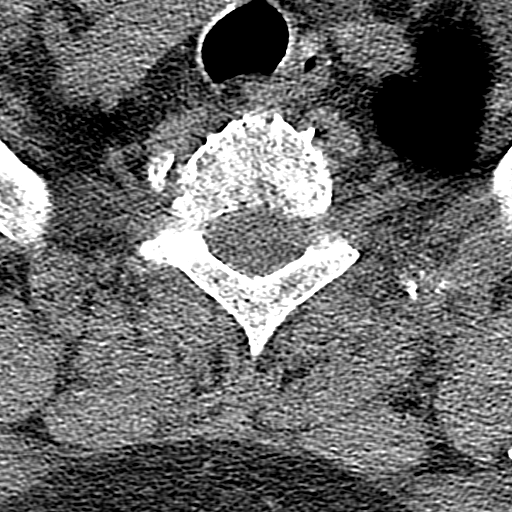
[im 16/92  bone]
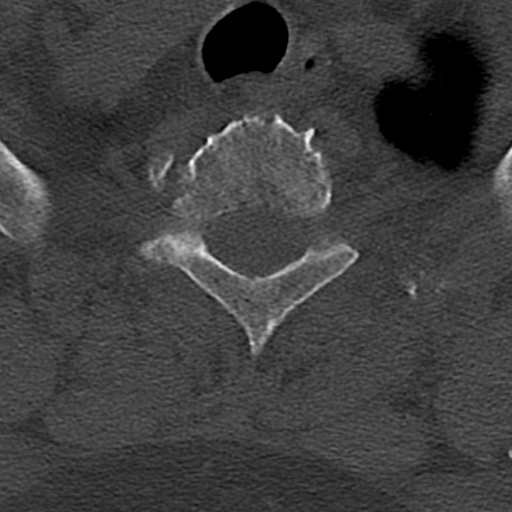
[im 31/92  bone]
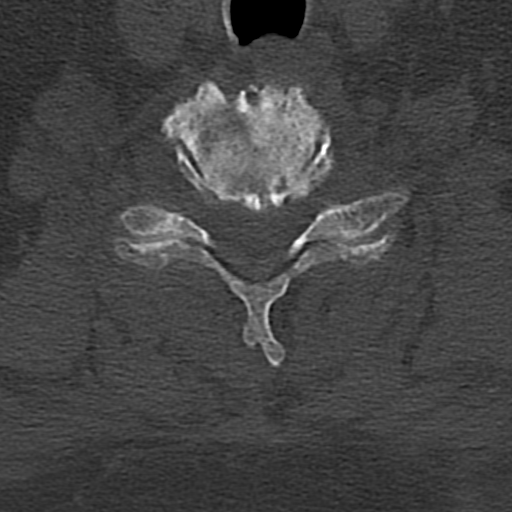
[im 46/92  bone]
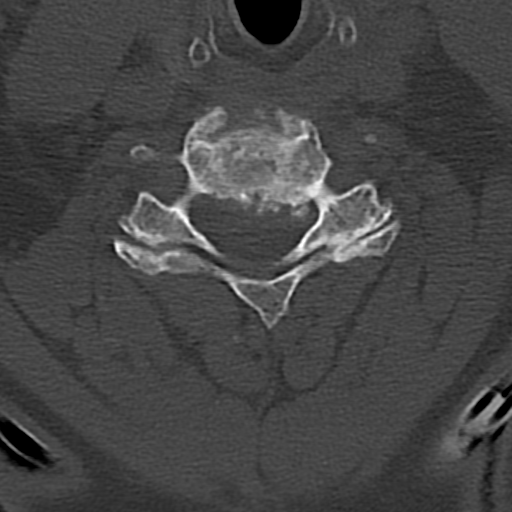
[im 61/92  bone]
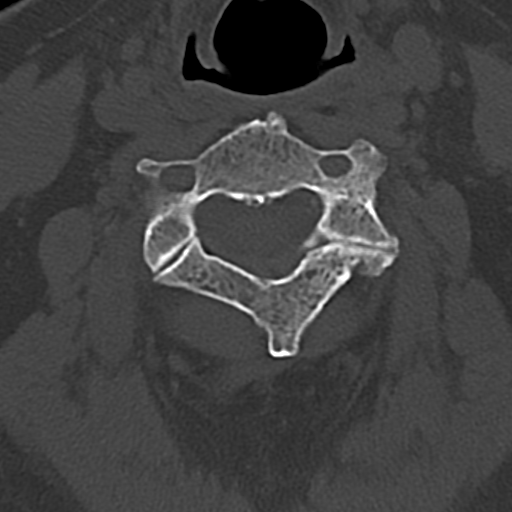
[im 76/92  soft-tissue]
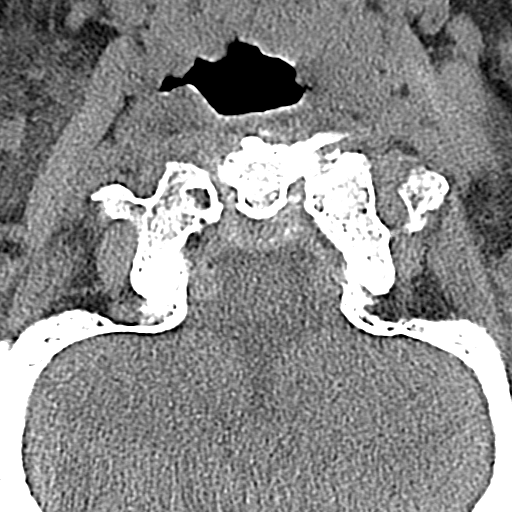
[im 76/92  bone]
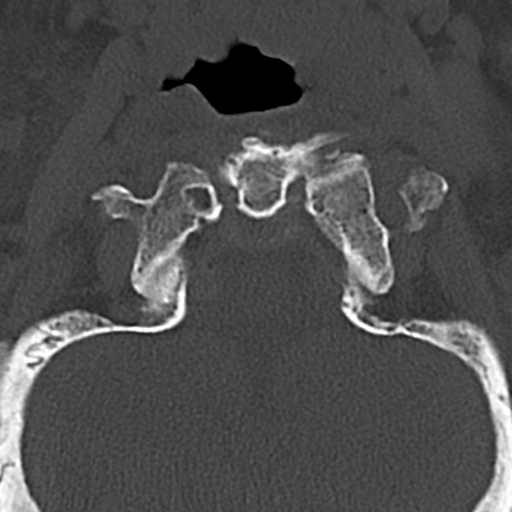

[Series 9: c_spine 2.0 sag bone · sagittal · 0.29mm/px · 5 of 61 slices shown, 6 images]
[im 21/61  bone]
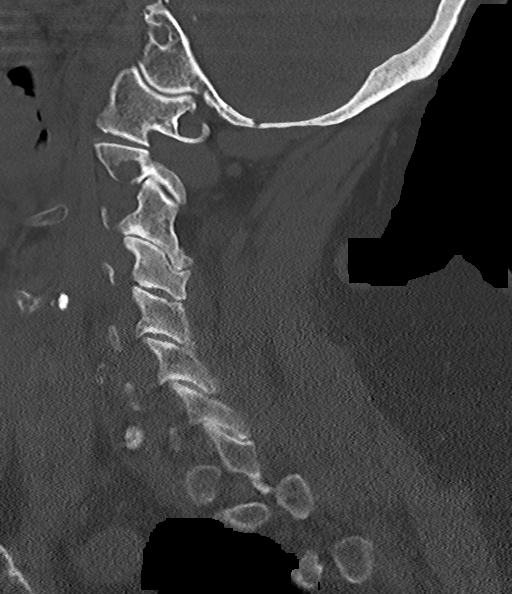
[im 26/61  bone]
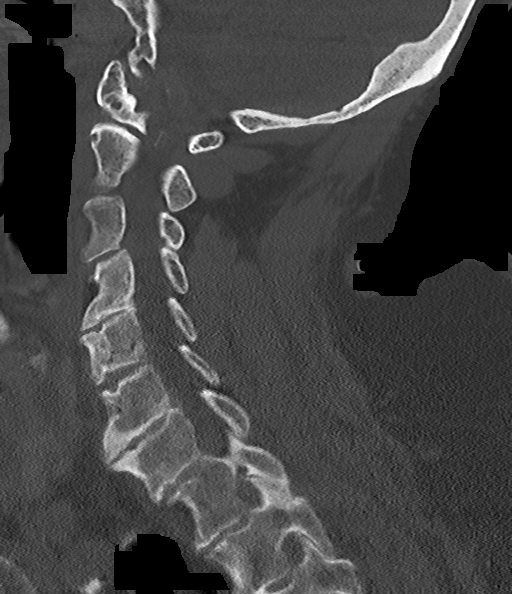
[im 31/61  soft-tissue]
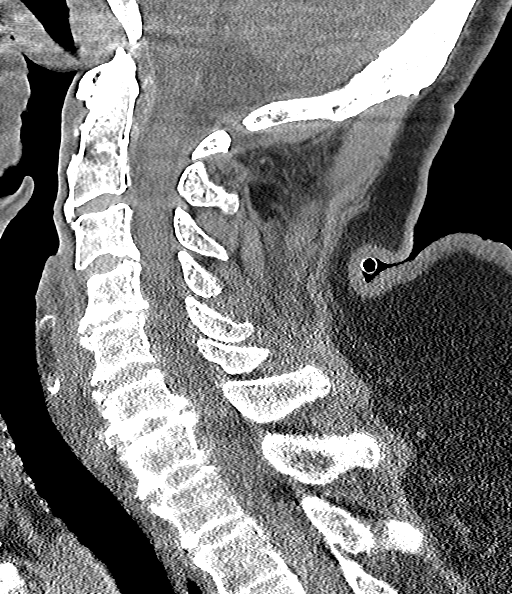
[im 31/61  bone]
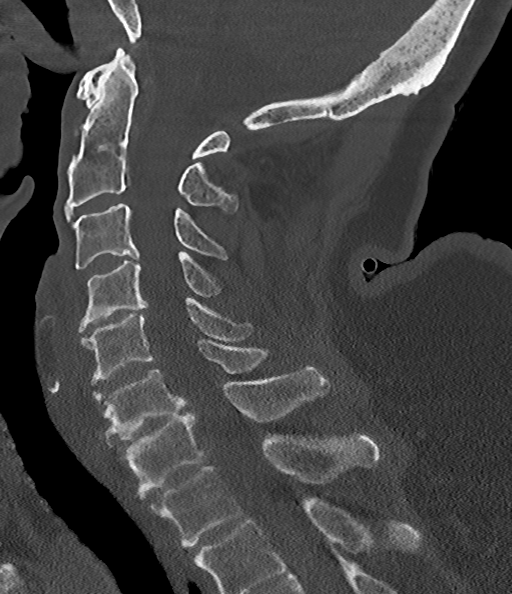
[im 36/61  bone]
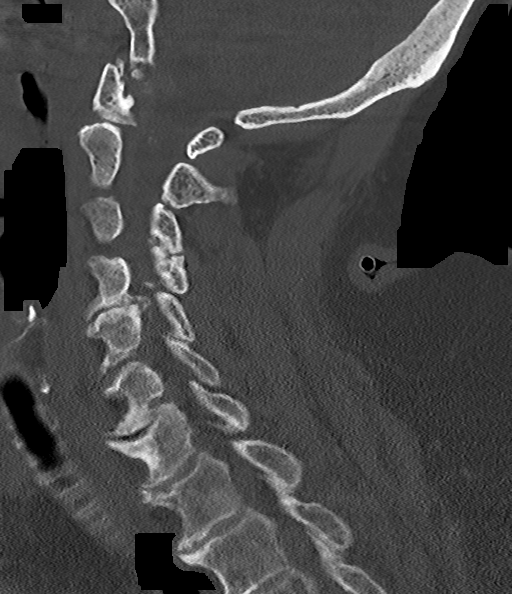
[im 41/61  bone]
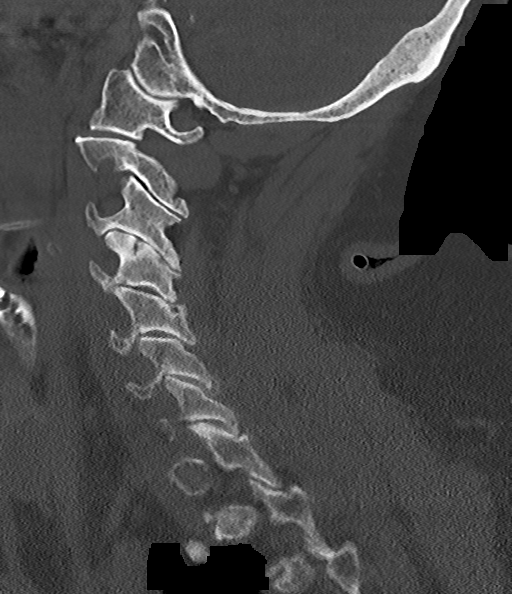

[Series 10: c_spine 2.0 cor bone · coronal · 0.25mm/px · 3 of 76 slices shown]
[im 16/76  bone]
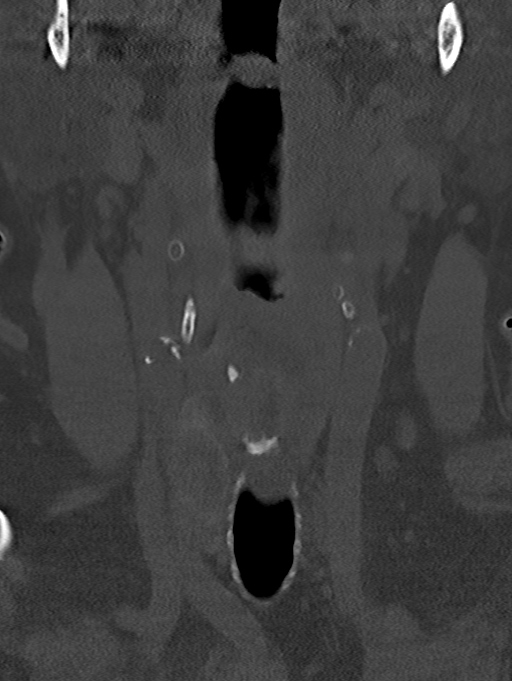
[im 31/76  bone]
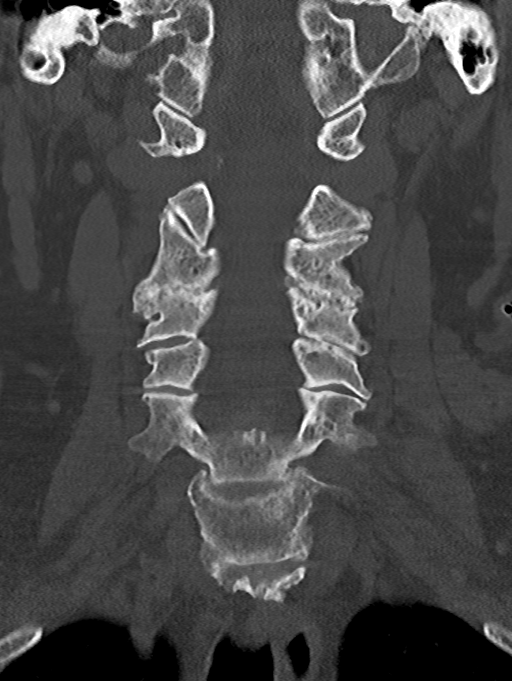
[im 46/76  bone]
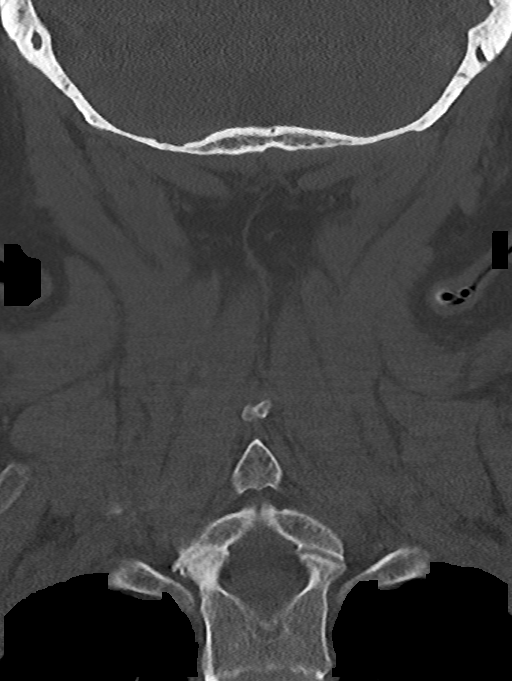

[13 of 33 positions shown; findings below may reference images not displayed]

FINDINGS: CT HEAD FINDINGS

Brain: When compared to the prior head CT there has been interval
evolution of the previously noted left MCA territory infarct, with
extensive areas of low-attenuation throughout the left temporal lobe
and to a lesser extent the left parieto-occipital region, compatible
with evolving encephalomalacia. Left MCA is hyperdense, presumably
from chronic occlusion. Mild cerebral atrophy. Physiologic
calcifications in the basal ganglia incidentally noted. Patchy and
confluent areas of decreased attenuation are noted throughout the
deep and periventricular white matter of the cerebral hemispheres
bilaterally, compatible with chronic microvascular ischemic disease.
No evidence of acute infarction, hemorrhage, hydrocephalus,
extra-axial collection or mass lesion/mass effect.

Vascular: Hyperdense left MCA. Atherosclerotic calcifications in the
cerebral vasculature.

Skull: Normal. Negative for fracture or focal lesion.

Sinuses/Orbits: No acute finding.

Other: None.

CT CERVICAL SPINE FINDINGS

Alignment: Normal.

Skull base and vertebrae: No acute fracture. No primary bone lesion
or focal pathologic process.

Soft tissues and spinal canal: No prevertebral fluid or swelling. No
visible canal hematoma.

Disc levels: Multilevel degenerative disc disease, most pronounced
at C4-C5 and C6-C7. Moderate multilevel facet arthropathy.

Upper chest: Unremarkable.

Other: None.
IMPRESSION: 1. No definite acute intracranial abnormalities. Evolution of
previously noted left MCA territory infarct, as above.
2. Extensive chronic microvascular ischemic changes in the cerebral
white matter, as above.
3. No evidence of significant acute traumatic injury to the cervical
spine.
4. Multilevel degenerative disc disease and cervical spondylosis, as
above.

## 2022-02-01 IMAGING — CT CT HIP*R* W/O CM
2 of 3 series · 17 of 46 positions shown, 19 images · non-contrast
Comparison: Pelvis radiograph [DATE]

CLINICAL DATA: Hip surgical planning

EXAM:
CT OF THE RIGHT HIP WITHOUT CONTRAST
TECHNIQUE: Multidetector CT imaging of the right hip was performed according to
the standard protocol. Multiplanar CT image reconstructions were
also generated.
RADIATION DOSE REDUCTION: This exam was performed according to the
departmental dose-optimization program which includes automated
exposure control, adjustment of the mA and/or kV according to
patient size and/or use of iterative reconstruction technique.

[Series 5: hip 2.0 st · axial · 0.56mm/px · z∈[+702,+924]mm · 14 of 129 slices shown, 16 images]
[im 9/129  soft-tissue]
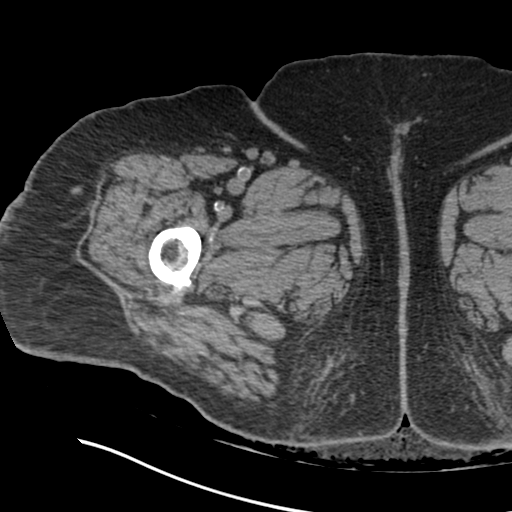
[im 9/129  bone]
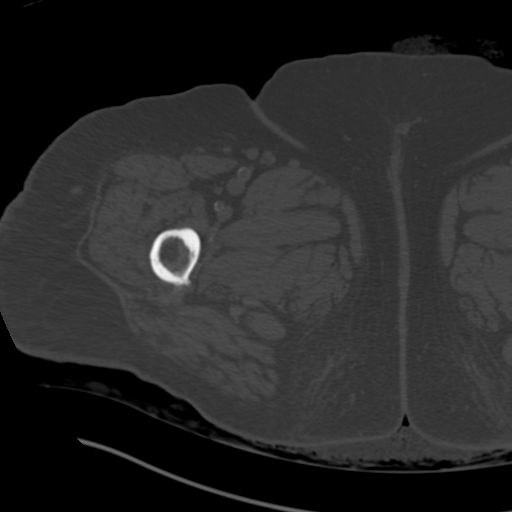
[im 17/129  soft-tissue]
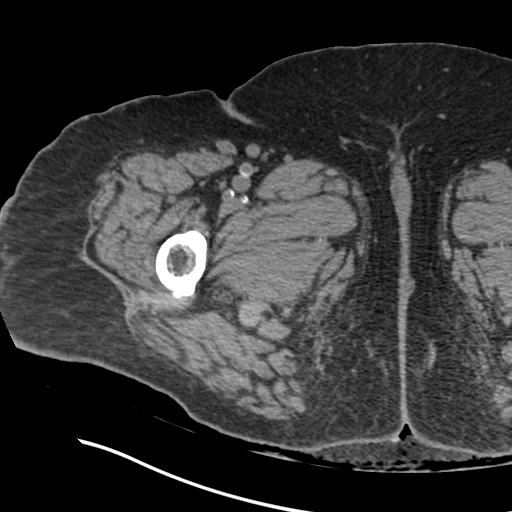
[im 25/129  soft-tissue]
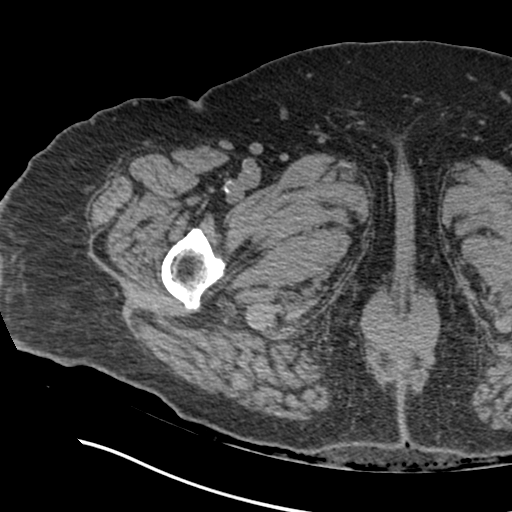
[im 34/129  soft-tissue]
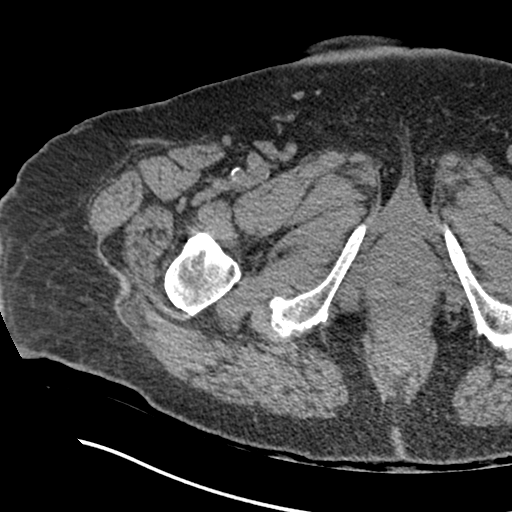
[im 42/129  soft-tissue]
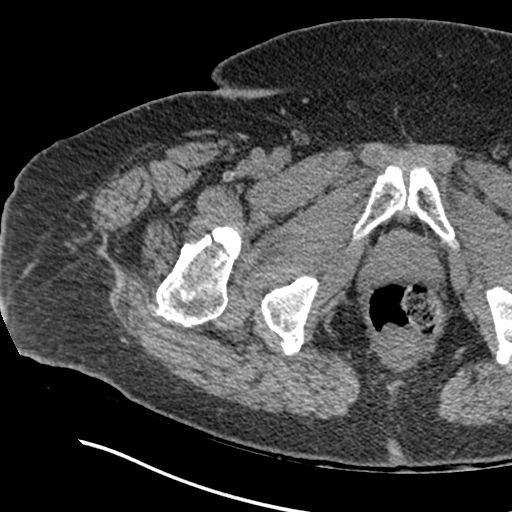
[im 50/129  soft-tissue]
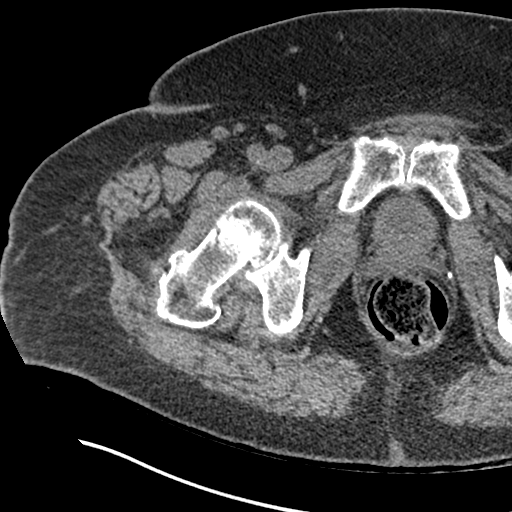
[im 58/129  soft-tissue]
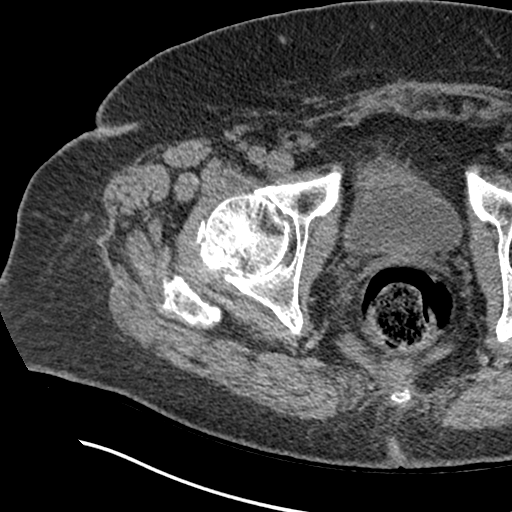
[im 71/129  soft-tissue]
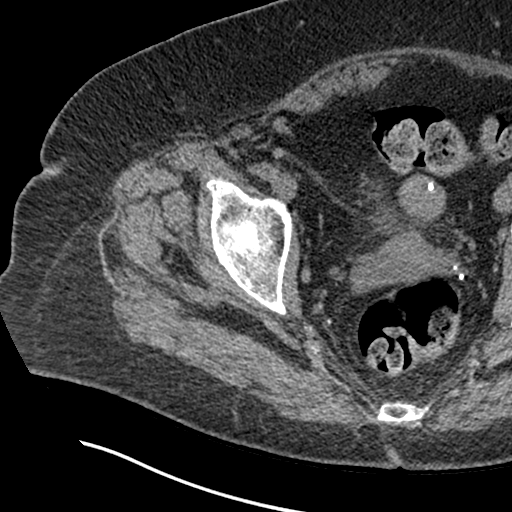
[im 79/129  soft-tissue]
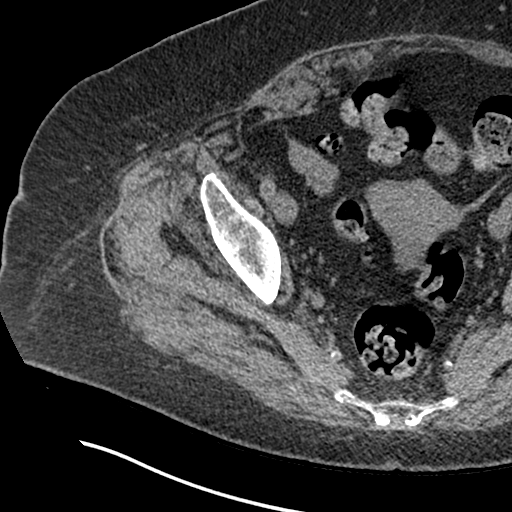
[im 79/129  bone]
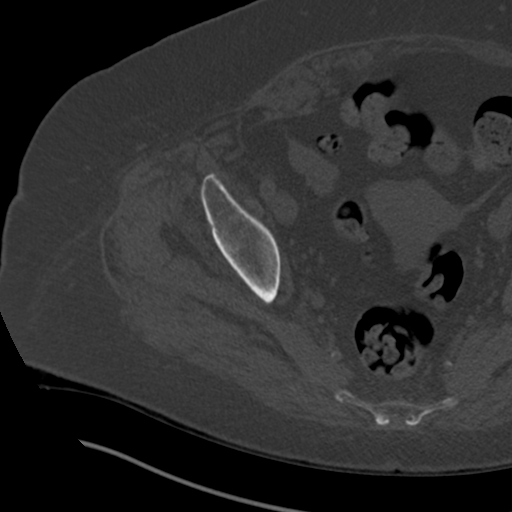
[im 87/129  soft-tissue]
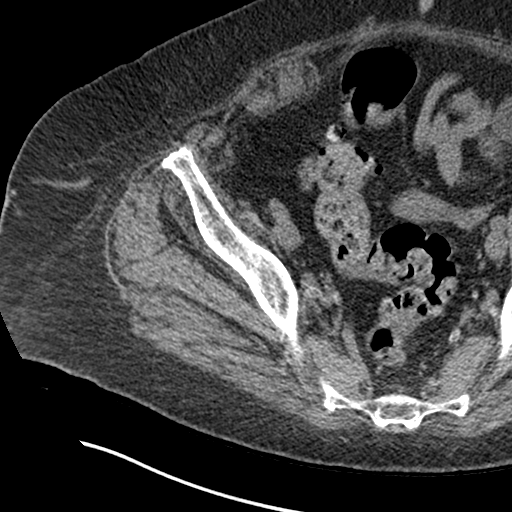
[im 95/129  soft-tissue]
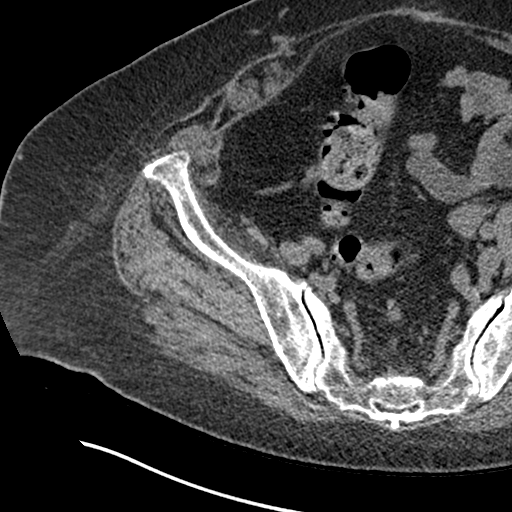
[im 104/129  soft-tissue]
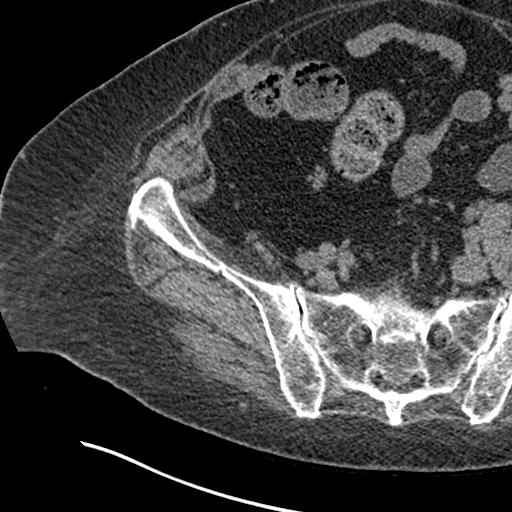
[im 112/129  soft-tissue]
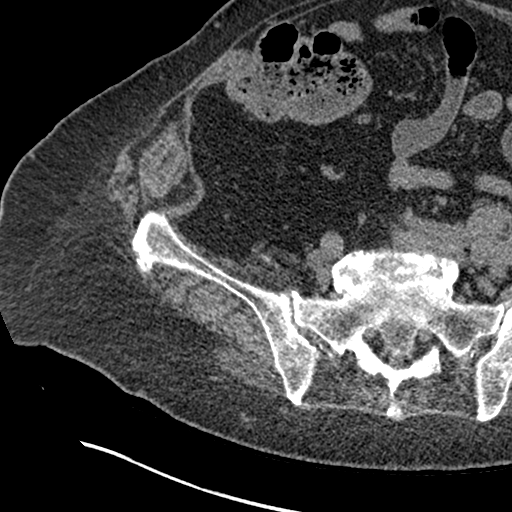
[im 120/129  soft-tissue]
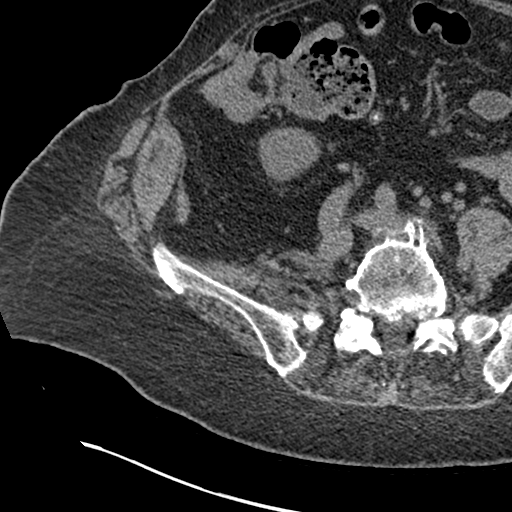

[Series 9: hip 2.0 cor. st · coronal · 0.50mm/px · 3 of 147 slices shown]
[im 49/147  soft-tissue]
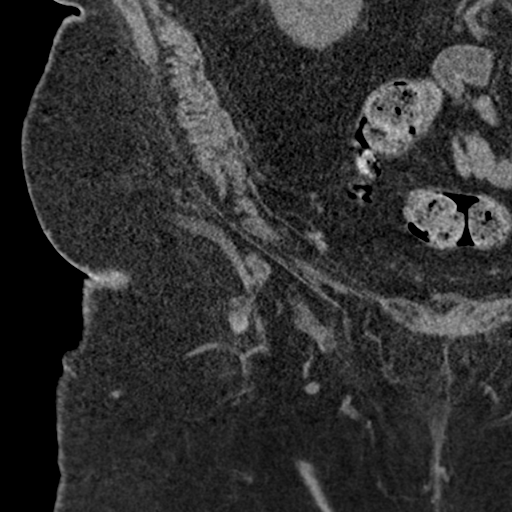
[im 65/147  soft-tissue]
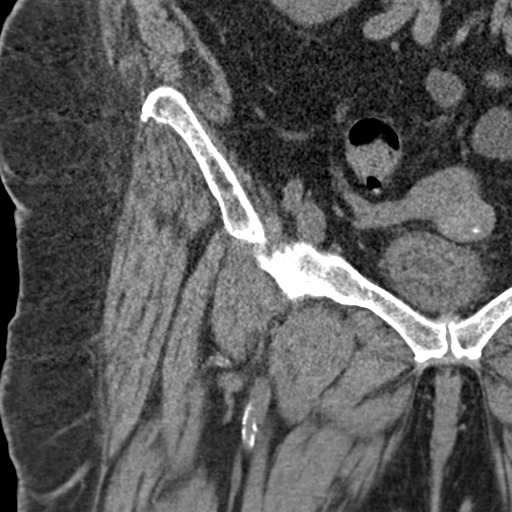
[im 82/147  soft-tissue]
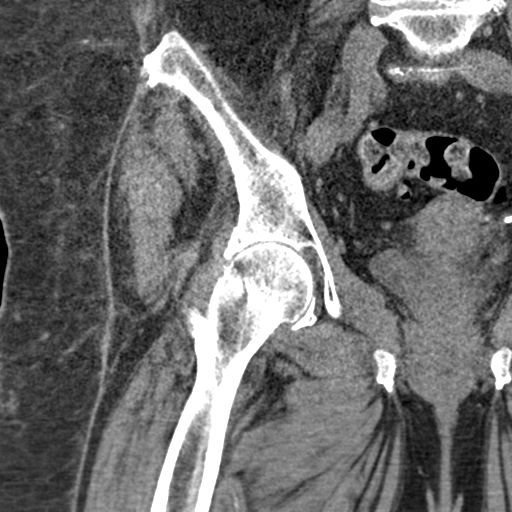

[17 of 46 positions shown; findings below may reference images not displayed]

FINDINGS: Bones/Joint/Cartilage

There is a minimally displaced subcapital right femoral neck
fracture with longitudinal component extending towards the base of
the femoral neck. Small right hip joint effusion. Mild-to-moderate
right hip osteoarthritis.

Ligaments

Suboptimally assessed by CT.

Muscles and Tendons

No acute myotendinous abnormality by CT.

Soft tissues

No focal fluid collection.

Pelvic structures

Sigmoid diverticulosis.  Normal appendix.
IMPRESSION: Minimally displaced subcapital right femoral neck fracture with
longitudinal component extending towards the base of the femoral
neck. Small joint effusion. Mild-to-moderate right hip
osteoarthritis.

## 2022-02-01 IMAGING — CR DG PELVIS 1-2V
1 series · 1 of 1 positions shown · non-contrast
Comparison: Portable chest [DATE]. No prior pelvic or femur
films.

CLINICAL DATA: Fall with right hip pain.

EXAM:
RIGHT FEMUR 2 VIEWS; CHEST  1 VIEW; PELVIS - 1-2 VIEW

[pelvis ap]
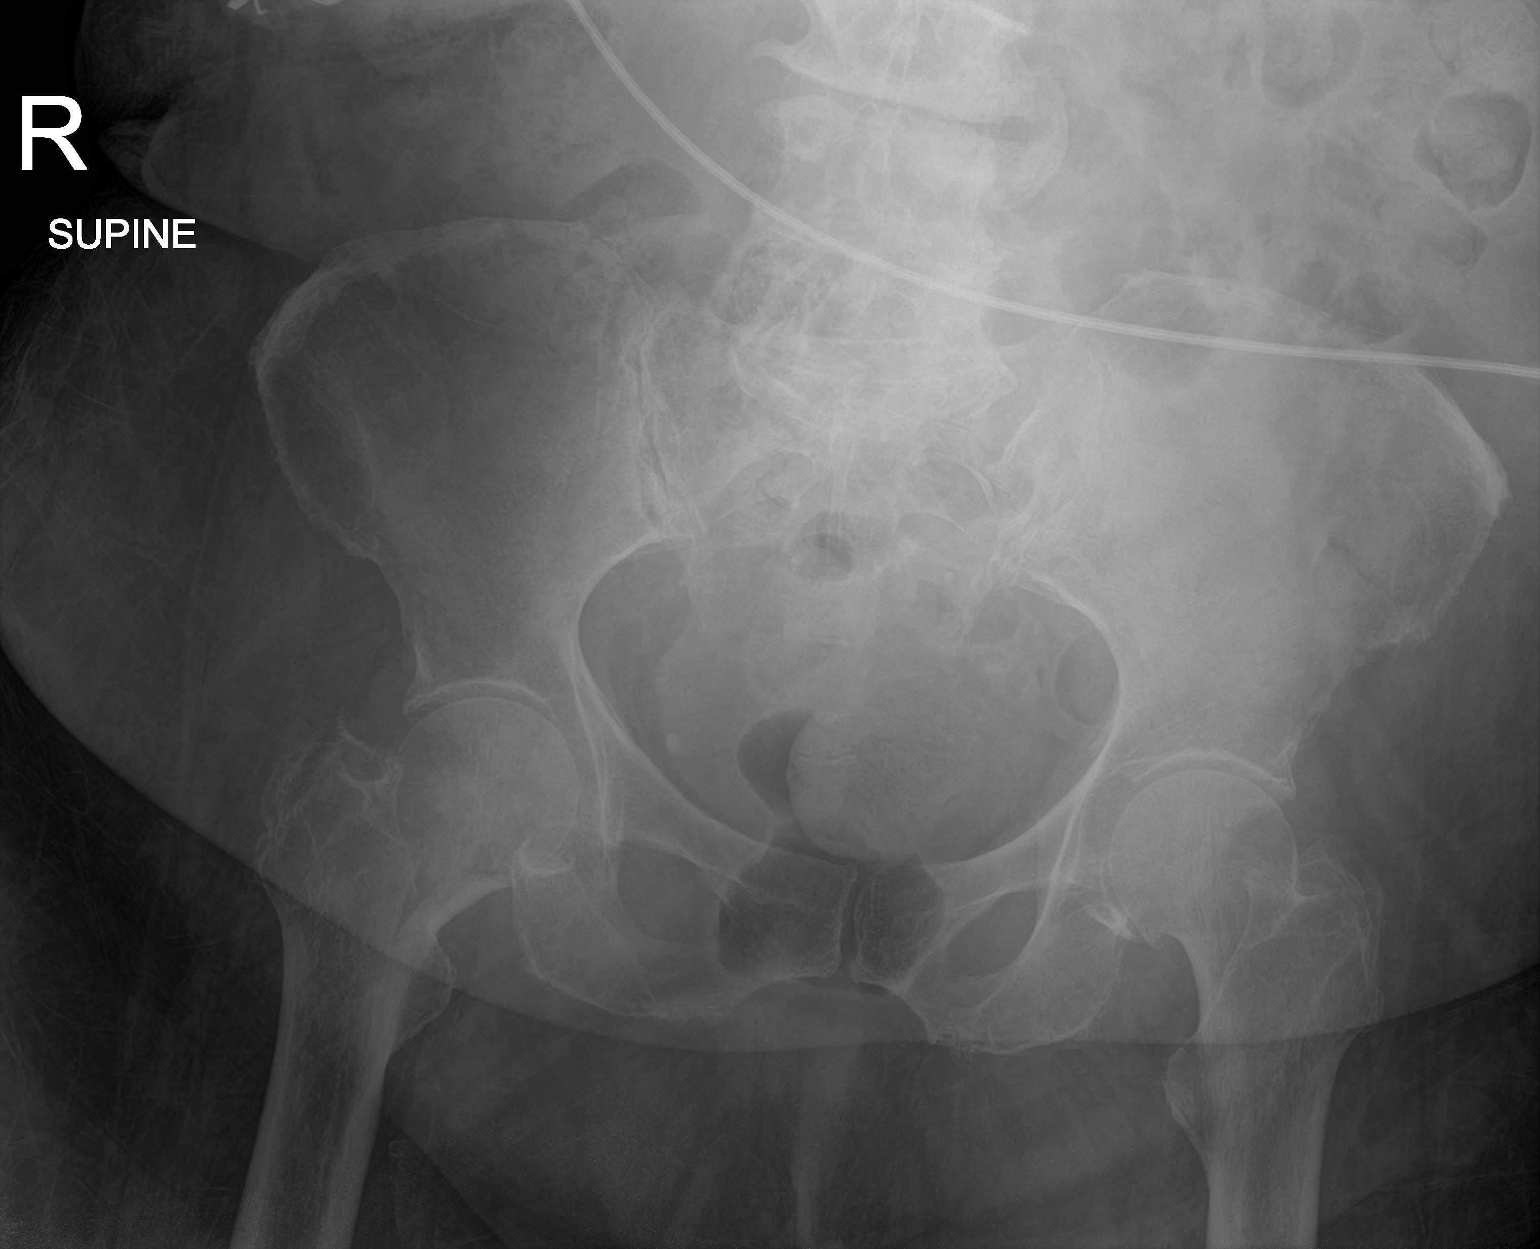

[1 of 1 positions shown; findings below may reference images not displayed]

FINDINGS: Chest:

There is mild cardiomegaly. Aorta is mildly ectatic with
atherosclerosis of the transverse segment. Stable mediastinum.

No vascular congestion is seen. The lungs hypoexpanded but generally
clear with limited view bases. There are multiple overlying monitor
wires.

Osteopenia.  Chronic healed fracture mid shaft left clavicle.

There are moderate degenerative changes of the shoulders. Thoracic
spondylosis.

AP pelvis, single view:

Osteopenia. There is no evidence of pelvic fractures or diastasis.
Mild symmetric degenerative arthrosis is noted of the hips SI joints
and pubic symphysis. Advanced degenerative change visualized lower
lumbar spine.

Right femoral series:

Osteopenia. There is an acute right femoral neck fracture but it is
difficult to adequately characterize due to positioning.

As best as can be seen, this appears to be an oblique mid to distal
femoral neck fracture with mild degree of impaction and otherwise
nondisplaced.

There is degenerative arthrosis at the hip and moderately the
femorotibial joint. There is no erosive arthropathy.

There are patchy calcifications in the femoral artery. There is
artifact from overlying clothing.
IMPRESSION: 1. No evidence of acute chest process. Hypoinflated with mild
cardiomegaly.
2. Acute right femoral neck fracture, not well seen but appears to
be an oblique mid to distal femoral neck fracture with mild
impaction.
3. Osteopenia and degenerative change.
4. Calcific plaques in the femoral artery.  Aortic atherosclerosis.

## 2022-02-01 IMAGING — CT CT HEAD W/O CM
3 of 4 series · 14 of 47 positions shown, 16 images · non-contrast
Comparison: Head CT [DATE]. Brain MRI [DATE]. CT
angiography of the neck [DATE].

CLINICAL DATA: 75-year-old female with history of minor head
trauma.



[Series 3: head without · axial · non-contrast · 0.39mm/px · z∈[-96,+34]mm · 7 of 36 slices shown, 9 images]
[im 5/36  brain]
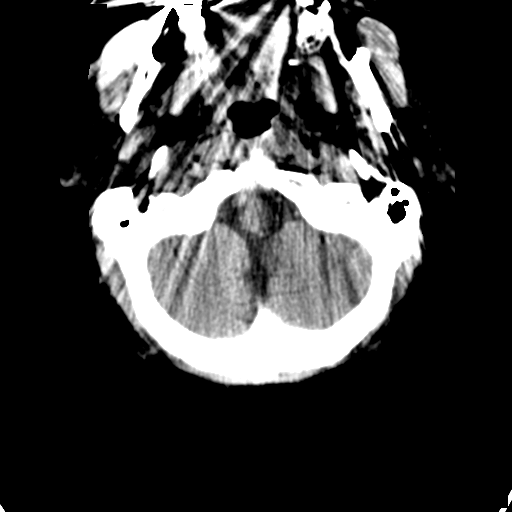
[im 5/36  bone]
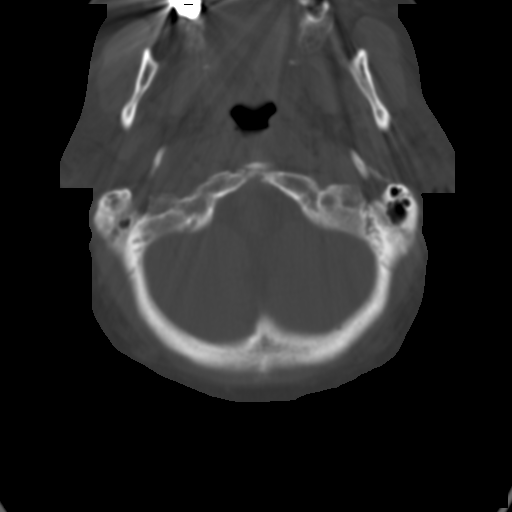
[im 9/36  brain]
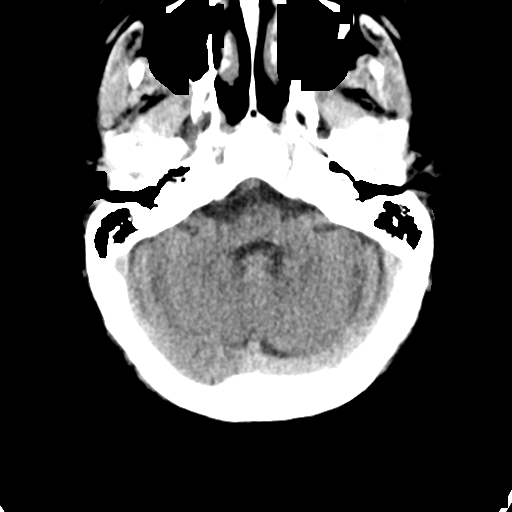
[im 14/36  brain]
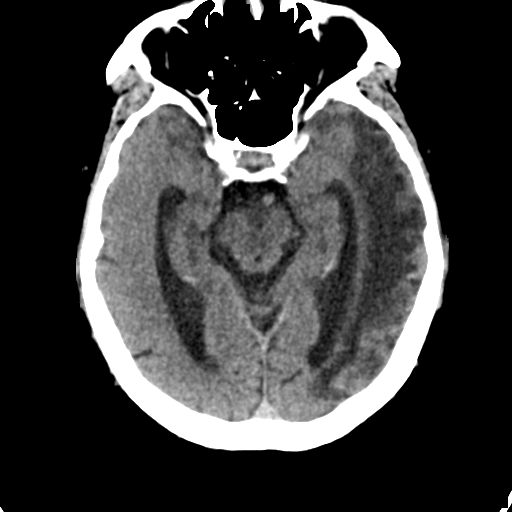
[im 18/36  brain]
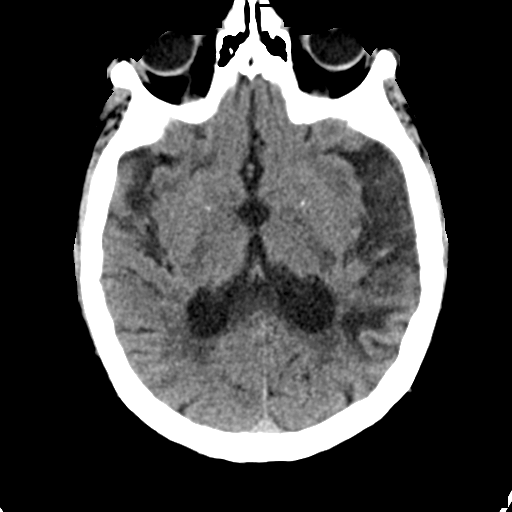
[im 22/36  brain]
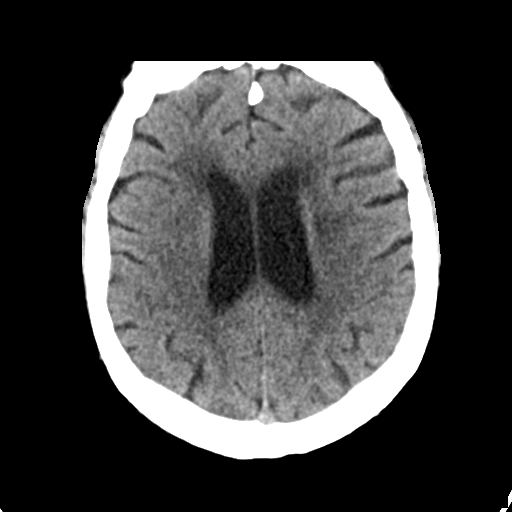
[im 22/36  bone]
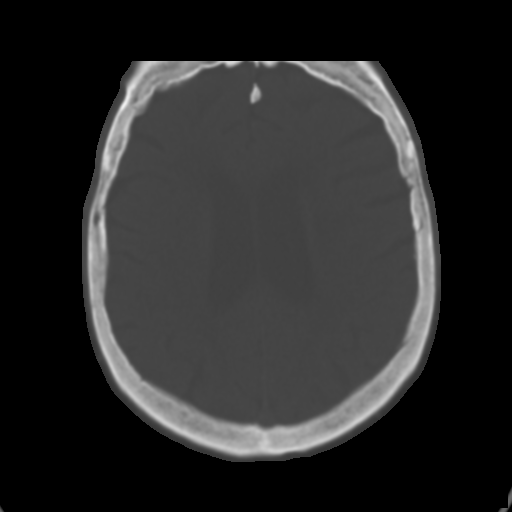
[im 27/36  brain]
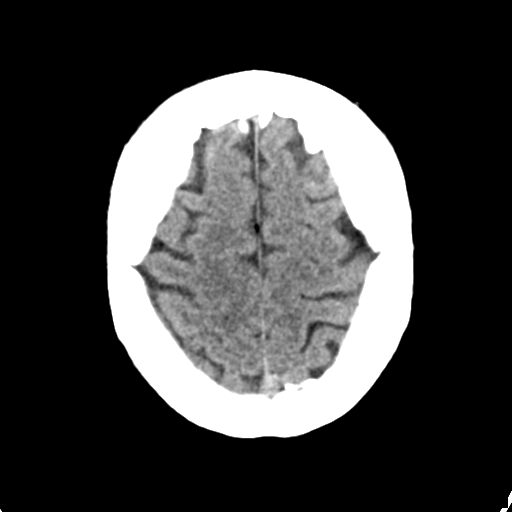
[im 31/36  brain]
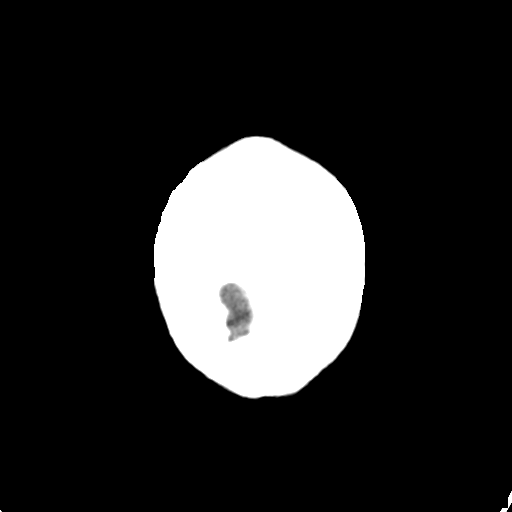

[Series 4: head bone · axial · 0.39mm/px · z∈[-100,-38]mm · 4 of 89 slices shown]
[im 9/89  bone]
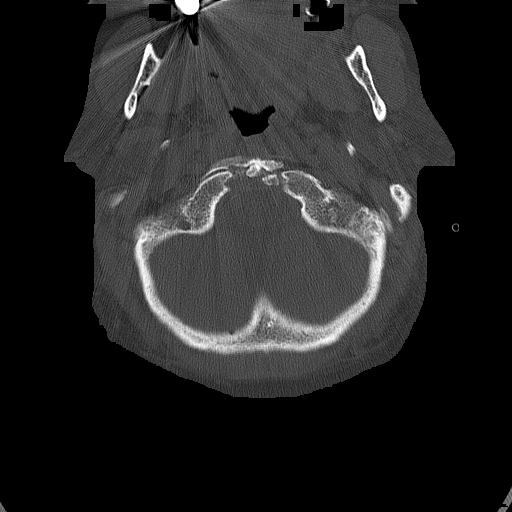
[im 18/89  bone]
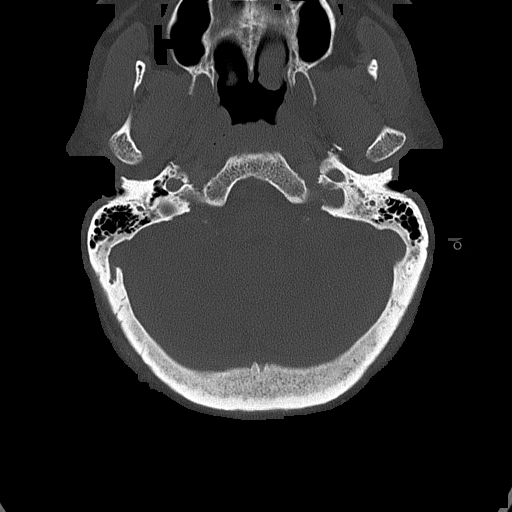
[im 27/89  bone]
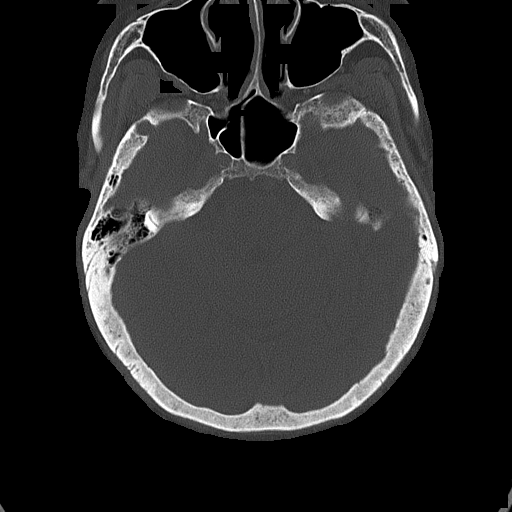
[im 40/89  bone]
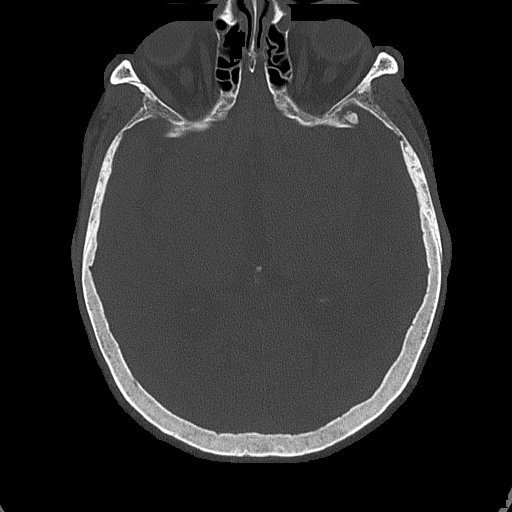

[Series 5: head without cor · coronal · non-contrast · 0.35mm/px · 3 of 73 slices shown]
[im 25/73  brain]
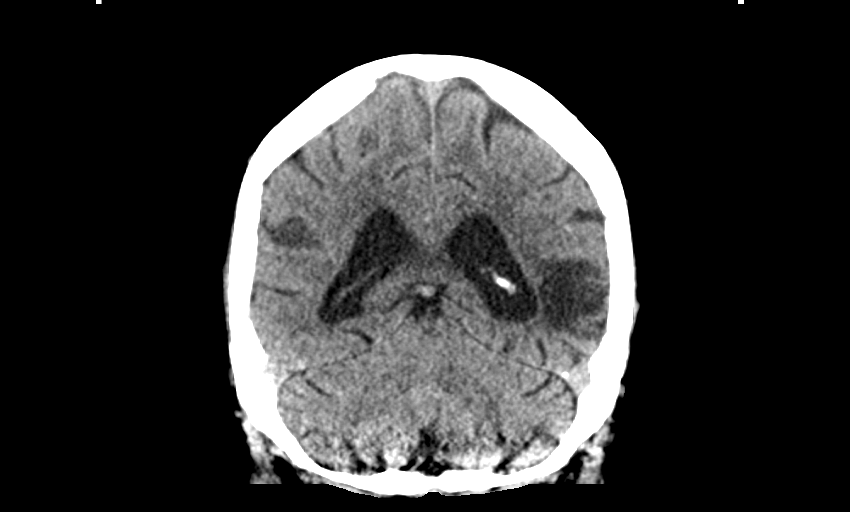
[im 33/73  brain]
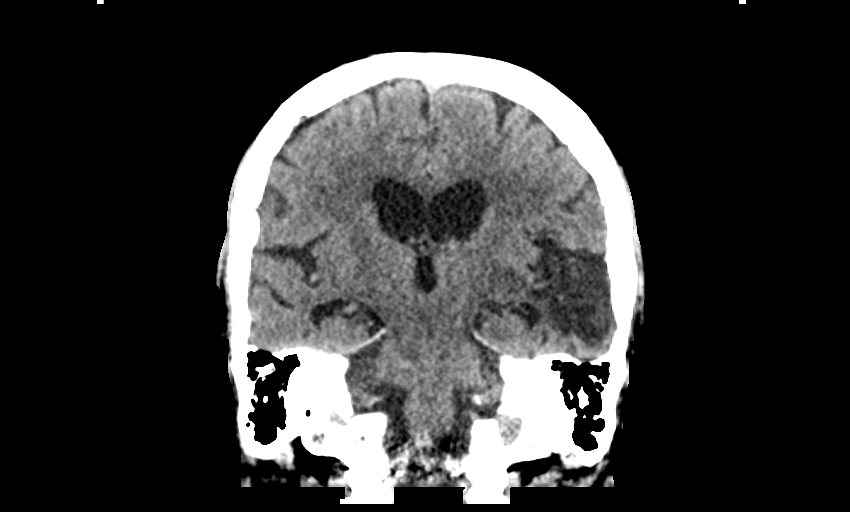
[im 41/73  brain]
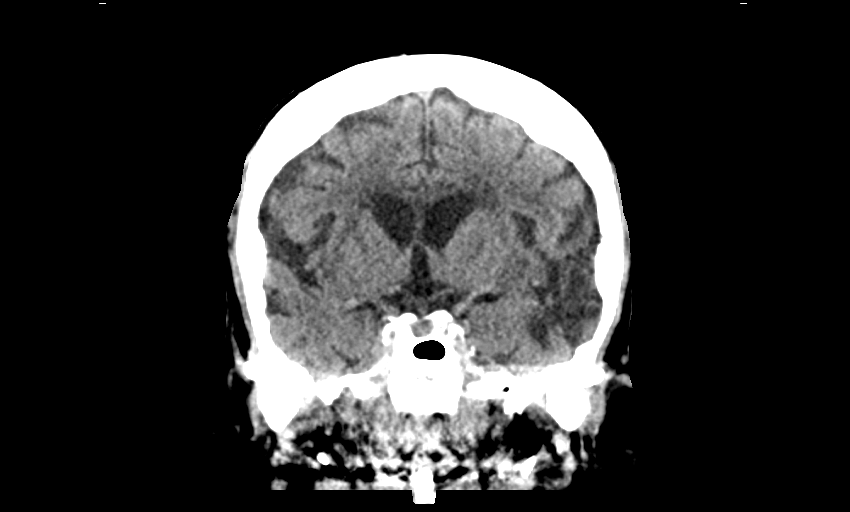

[14 of 47 positions shown; findings below may reference images not displayed]

FINDINGS: CT HEAD FINDINGS

Brain: When compared to the prior head CT there has been interval
evolution of the previously noted left MCA territory infarct, with
extensive areas of low-attenuation throughout the left temporal lobe
and to a lesser extent the left parieto-occipital region, compatible
with evolving encephalomalacia. Left MCA is hyperdense, presumably
from chronic occlusion. Mild cerebral atrophy. Physiologic
calcifications in the basal ganglia incidentally noted. Patchy and
confluent areas of decreased attenuation are noted throughout the
deep and periventricular white matter of the cerebral hemispheres
bilaterally, compatible with chronic microvascular ischemic disease.
No evidence of acute infarction, hemorrhage, hydrocephalus,
extra-axial collection or mass lesion/mass effect.

Vascular: Hyperdense left MCA. Atherosclerotic calcifications in the
cerebral vasculature.

Skull: Normal. Negative for fracture or focal lesion.

Sinuses/Orbits: No acute finding.

Other: None.

CT CERVICAL SPINE FINDINGS

Alignment: Normal.

Skull base and vertebrae: No acute fracture. No primary bone lesion
or focal pathologic process.

Soft tissues and spinal canal: No prevertebral fluid or swelling. No
visible canal hematoma.

Disc levels: Multilevel degenerative disc disease, most pronounced
at C4-C5 and C6-C7. Moderate multilevel facet arthropathy.

Upper chest: Unremarkable.

Other: None.
IMPRESSION: 1. No definite acute intracranial abnormalities. Evolution of
previously noted left MCA territory infarct, as above.
2. Extensive chronic microvascular ischemic changes in the cerebral
white matter, as above.
3. No evidence of significant acute traumatic injury to the cervical
spine.
4. Multilevel degenerative disc disease and cervical spondylosis, as
above.

## 2022-02-01 IMAGING — CR DG FEMUR 2+V*R*
4 series · 4 of 4 positions shown · non-contrast
Comparison: Portable chest [DATE]. No prior pelvic or femur
films.

CLINICAL DATA: Fall with right hip pain.

EXAM:
RIGHT FEMUR 2 VIEWS; CHEST  1 VIEW; PELVIS - 1-2 VIEW

[femur lat (1 of 2)]
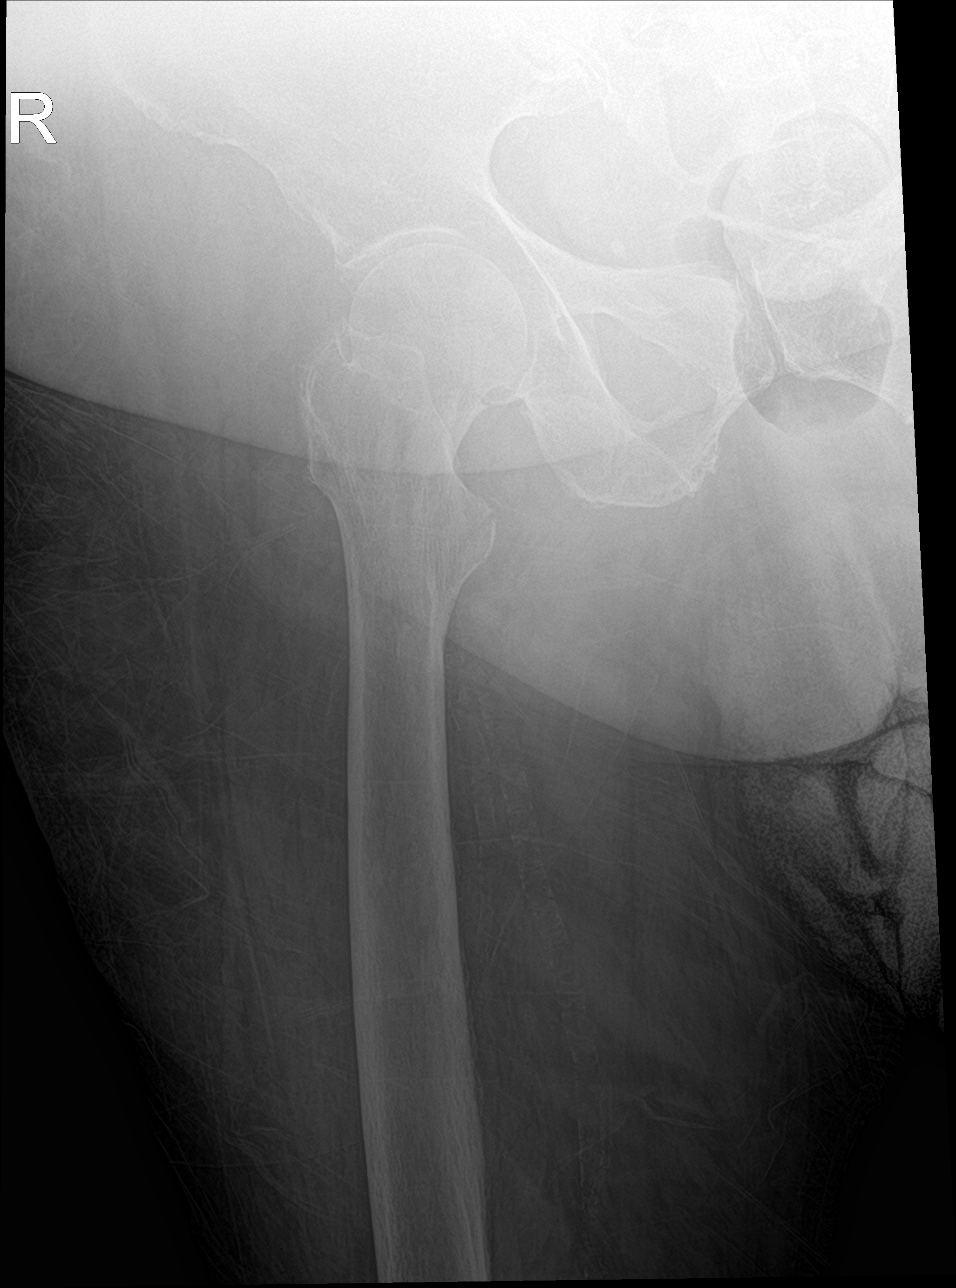

[femur lat (2 of 2)]
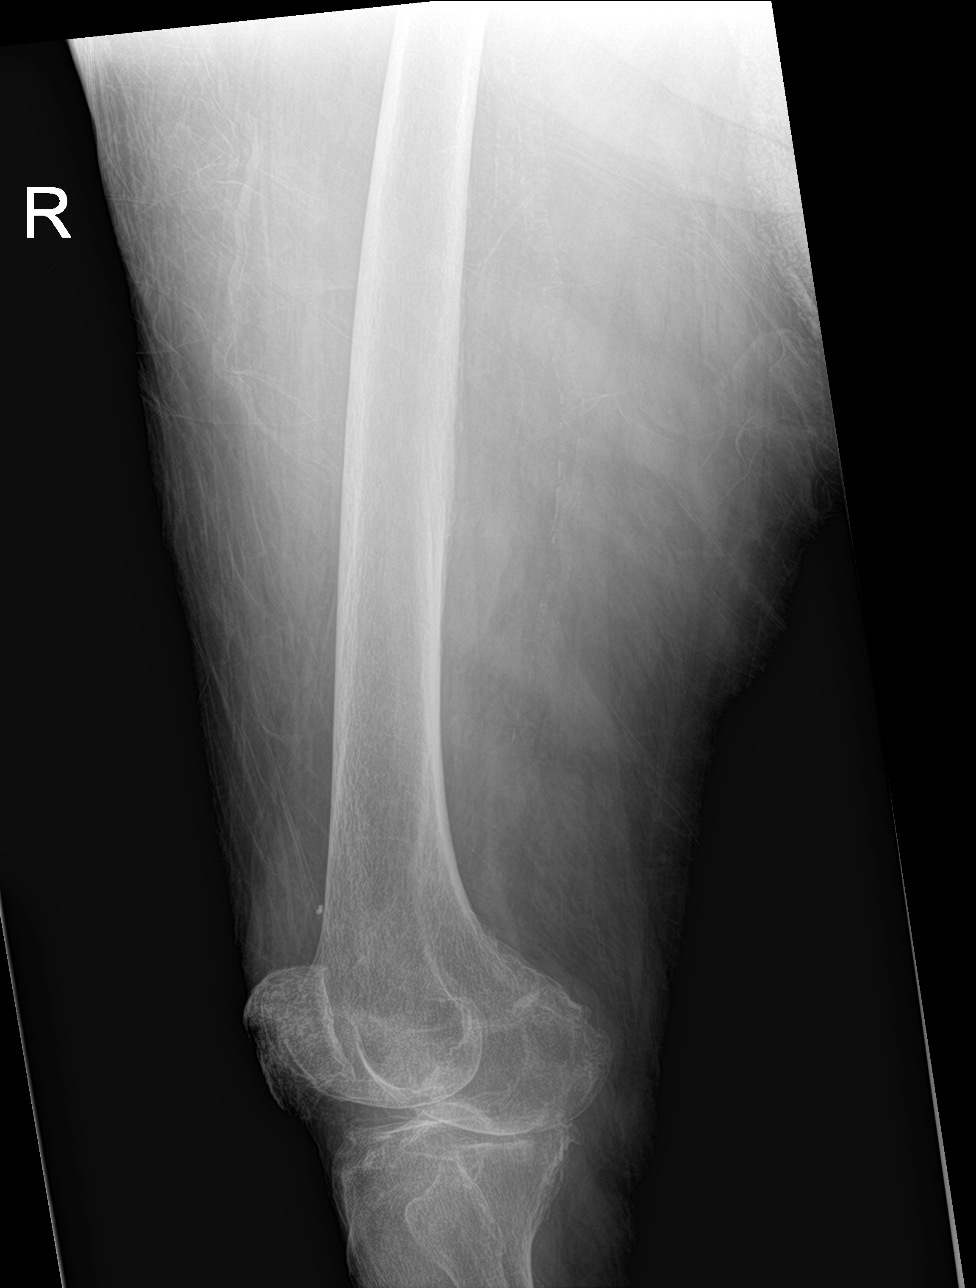

[femur ap (1 of 2)]
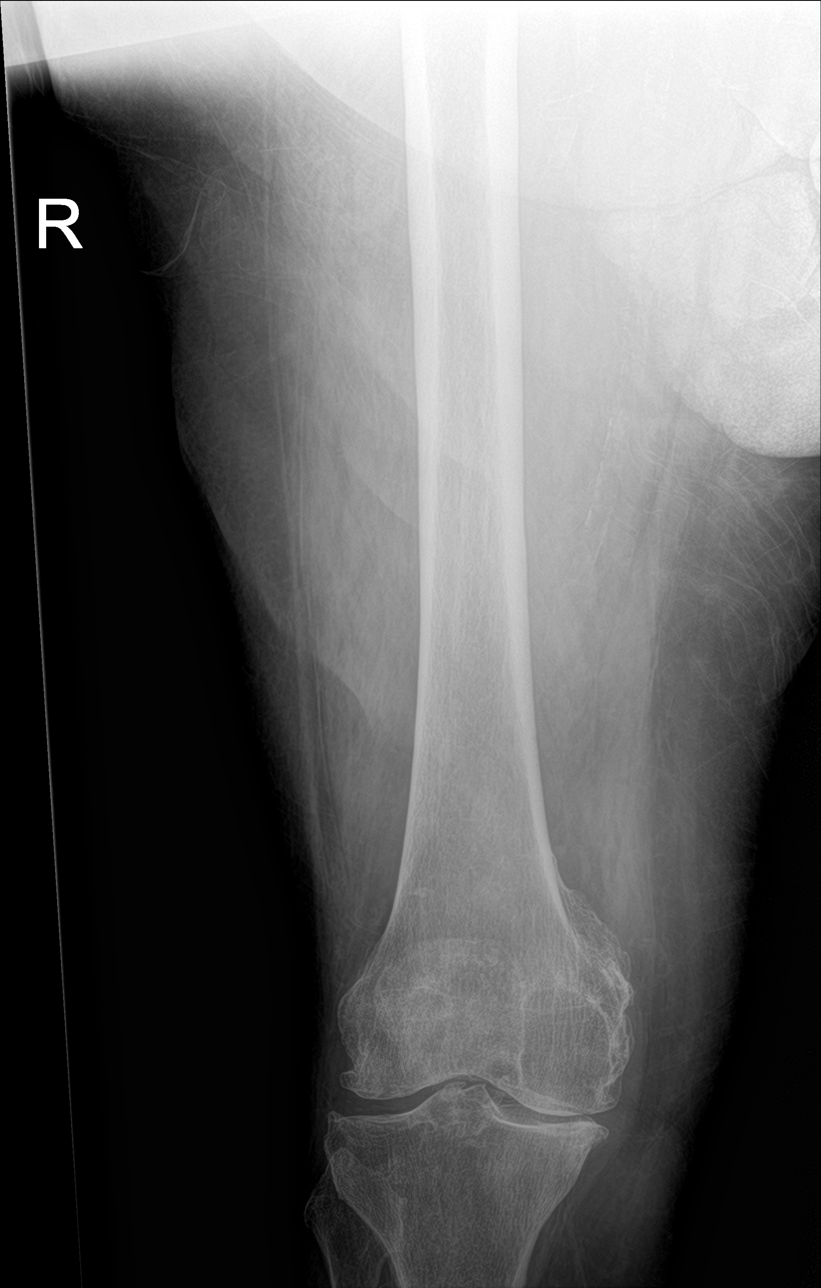

[femur ap (2 of 2)]
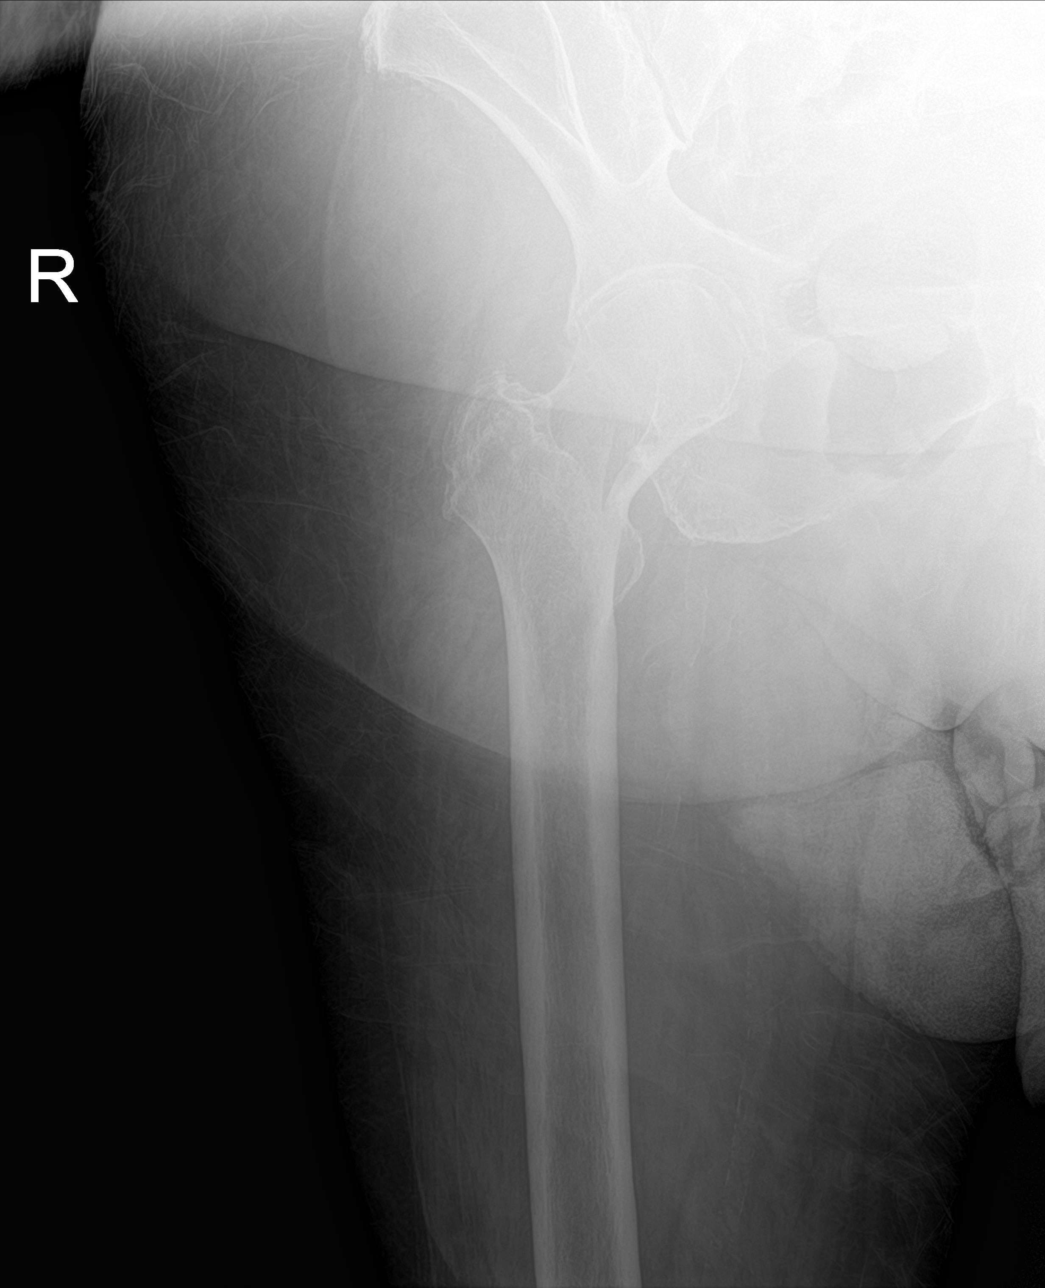

[4 of 4 positions shown; findings below may reference images not displayed]

FINDINGS: Chest:

There is mild cardiomegaly. Aorta is mildly ectatic with
atherosclerosis of the transverse segment. Stable mediastinum.

No vascular congestion is seen. The lungs hypoexpanded but generally
clear with limited view bases. There are multiple overlying monitor
wires.

Osteopenia.  Chronic healed fracture mid shaft left clavicle.

There are moderate degenerative changes of the shoulders. Thoracic
spondylosis.

AP pelvis, single view:

Osteopenia. There is no evidence of pelvic fractures or diastasis.
Mild symmetric degenerative arthrosis is noted of the hips SI joints
and pubic symphysis. Advanced degenerative change visualized lower
lumbar spine.

Right femoral series:

Osteopenia. There is an acute right femoral neck fracture but it is
difficult to adequately characterize due to positioning.

As best as can be seen, this appears to be an oblique mid to distal
femoral neck fracture with mild degree of impaction and otherwise
nondisplaced.

There is degenerative arthrosis at the hip and moderately the
femorotibial joint. There is no erosive arthropathy.

There are patchy calcifications in the femoral artery. There is
artifact from overlying clothing.
IMPRESSION: 1. No evidence of acute chest process. Hypoinflated with mild
cardiomegaly.
2. Acute right femoral neck fracture, not well seen but appears to
be an oblique mid to distal femoral neck fracture with mild
impaction.
3. Osteopenia and degenerative change.
4. Calcific plaques in the femoral artery.  Aortic atherosclerosis.

## 2022-02-01 IMAGING — CR DG CHEST 1V
1 series · 1 of 1 positions shown · non-contrast
Comparison: Portable chest [DATE]. No prior pelvic or femur
films.

CLINICAL DATA: Fall with right hip pain.

EXAM:
RIGHT FEMUR 2 VIEWS; CHEST  1 VIEW; PELVIS - 1-2 VIEW

[chest ap]
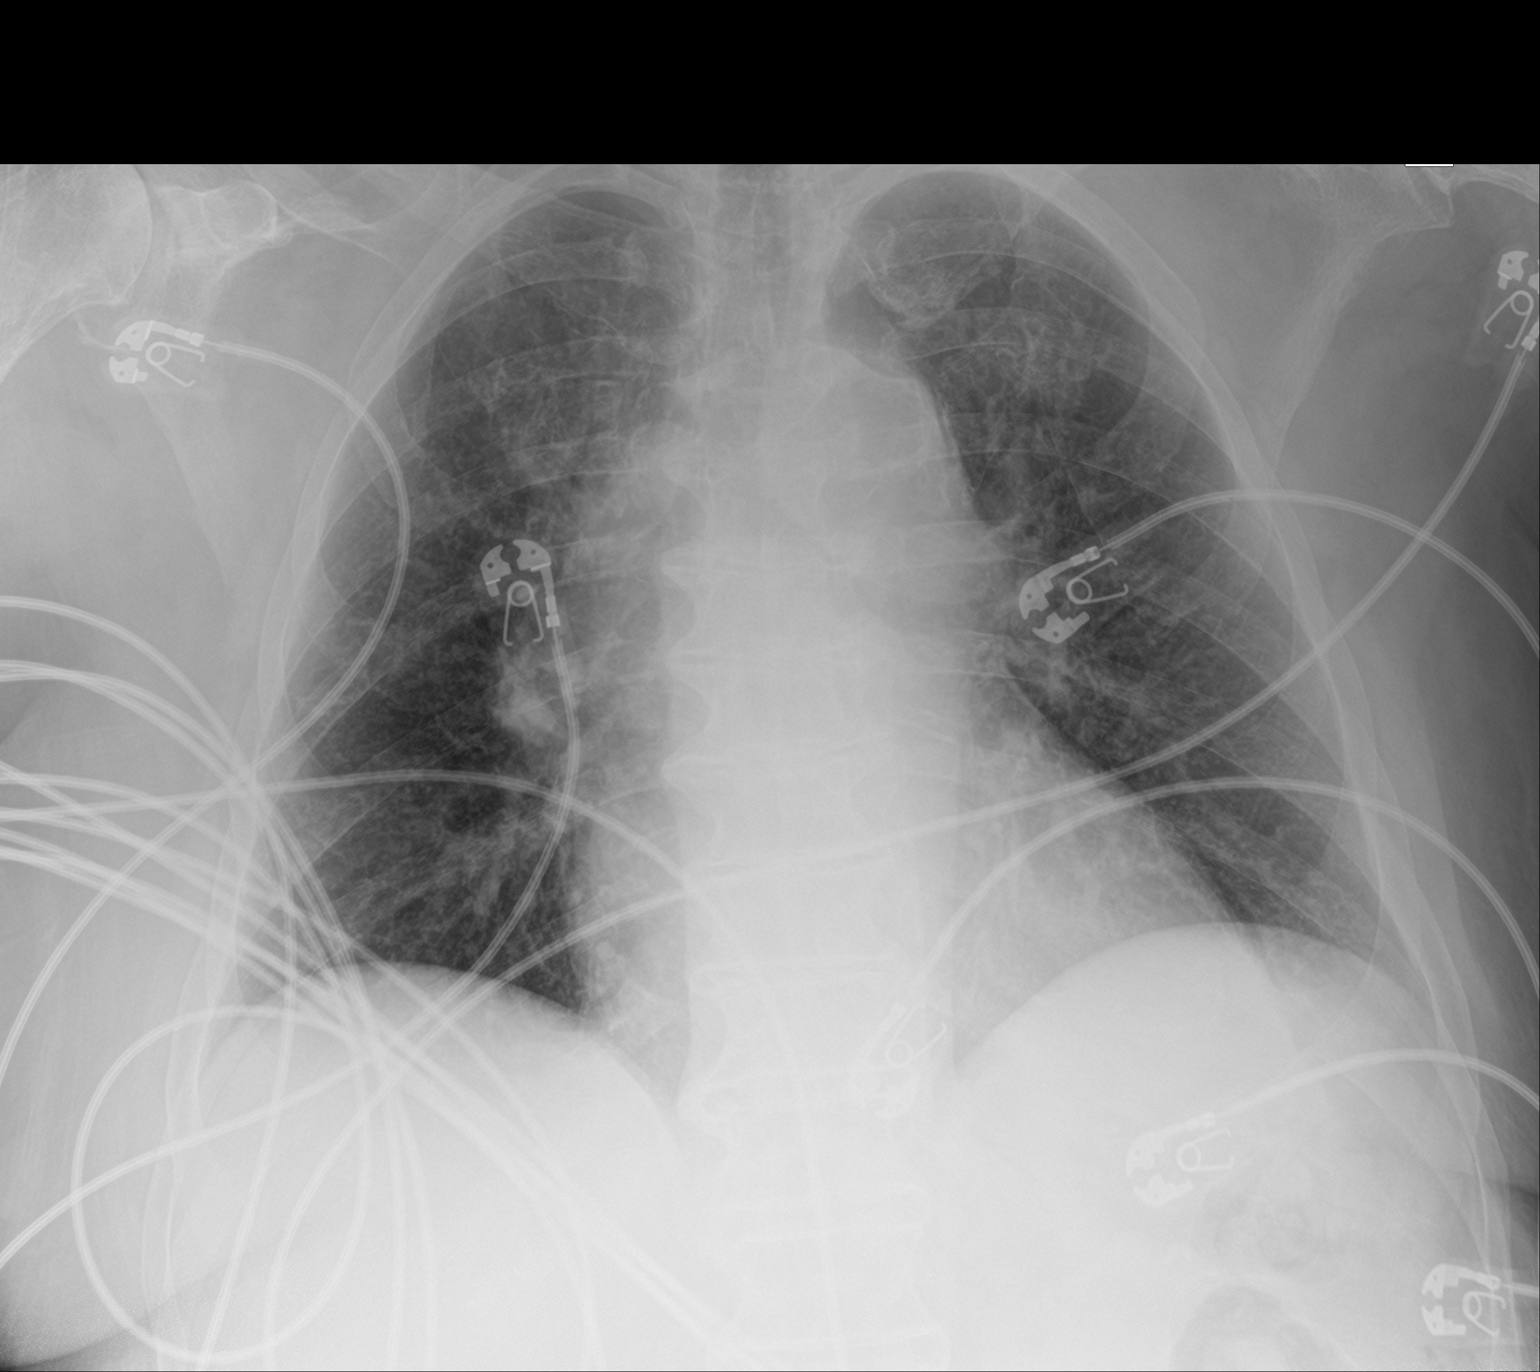

[1 of 1 positions shown; findings below may reference images not displayed]

FINDINGS: Chest:

There is mild cardiomegaly. Aorta is mildly ectatic with
atherosclerosis of the transverse segment. Stable mediastinum.

No vascular congestion is seen. The lungs hypoexpanded but generally
clear with limited view bases. There are multiple overlying monitor
wires.

Osteopenia.  Chronic healed fracture mid shaft left clavicle.

There are moderate degenerative changes of the shoulders. Thoracic
spondylosis.

AP pelvis, single view:

Osteopenia. There is no evidence of pelvic fractures or diastasis.
Mild symmetric degenerative arthrosis is noted of the hips SI joints
and pubic symphysis. Advanced degenerative change visualized lower
lumbar spine.

Right femoral series:

Osteopenia. There is an acute right femoral neck fracture but it is
difficult to adequately characterize due to positioning.

As best as can be seen, this appears to be an oblique mid to distal
femoral neck fracture with mild degree of impaction and otherwise
nondisplaced.

There is degenerative arthrosis at the hip and moderately the
femorotibial joint. There is no erosive arthropathy.

There are patchy calcifications in the femoral artery. There is
artifact from overlying clothing.
IMPRESSION: 1. No evidence of acute chest process. Hypoinflated with mild
cardiomegaly.
2. Acute right femoral neck fracture, not well seen but appears to
be an oblique mid to distal femoral neck fracture with mild
impaction.
3. Osteopenia and degenerative change.
4. Calcific plaques in the femoral artery.  Aortic atherosclerosis.

## 2022-02-01 MED ORDER — BISACODYL 5 MG PO TBEC: 5.0000 mg | DELAYED_RELEASE_TABLET | Freq: Every day | ORAL | Status: DC | PRN

## 2022-02-01 MED ORDER — FENOFIBRATE 54 MG PO TABS
54.0000 mg | ORAL_TABLET | Freq: Every day | ORAL | Status: DC
  Administered 2022-02-01 – 2022-02-09 (×8): 54 mg via ORAL
  Filled 2022-02-01 (×9): qty 1

## 2022-02-01 MED ORDER — FENTANYL CITRATE (PF) 100 MCG/2ML IJ SOLN
INTRAMUSCULAR | Status: AC
  Filled 2022-02-01: qty 2

## 2022-02-01 MED ORDER — INSULIN ASPART 100 UNIT/ML IJ SOLN
0.0000 [IU] | Freq: Three times a day (TID) | INTRAMUSCULAR | Status: DC
  Administered 2022-02-03: 3 [IU] via SUBCUTANEOUS
  Administered 2022-02-03: 5 [IU] via SUBCUTANEOUS
  Administered 2022-02-03 – 2022-02-04 (×2): 3 [IU] via SUBCUTANEOUS
  Administered 2022-02-04: 5 [IU] via SUBCUTANEOUS
  Administered 2022-02-04 – 2022-02-06 (×6): 3 [IU] via SUBCUTANEOUS
  Administered 2022-02-07: 2 [IU] via SUBCUTANEOUS
  Administered 2022-02-07 – 2022-02-09 (×5): 3 [IU] via SUBCUTANEOUS
  Administered 2022-02-09 (×2): 2 [IU] via SUBCUTANEOUS

## 2022-02-01 MED ORDER — HYDROXYZINE HCL 10 MG PO TABS
10.0000 mg | ORAL_TABLET | Freq: Once | ORAL | Status: AC
  Administered 2022-02-01: 10 mg via ORAL
  Filled 2022-02-01: qty 1

## 2022-02-01 MED ORDER — EZETIMIBE 10 MG PO TABS
10.0000 mg | ORAL_TABLET | Freq: Every day | ORAL | Status: DC
  Administered 2022-02-01 – 2022-02-09 (×9): 10 mg via ORAL
  Filled 2022-02-01 (×10): qty 1

## 2022-02-01 MED ORDER — MORPHINE SULFATE (PF) 4 MG/ML IV SOLN
4.0000 mg | Freq: Once | INTRAVENOUS | Status: AC
  Administered 2022-02-01: 4 mg via INTRAVENOUS
  Filled 2022-02-01: qty 1

## 2022-02-01 MED ORDER — HYDROXYZINE HCL 10 MG PO TABS
10.0000 mg | ORAL_TABLET | Freq: Three times a day (TID) | ORAL | Status: DC | PRN
Start: 2022-02-01 — End: 2022-02-09
  Administered 2022-02-02 – 2022-02-09 (×11): 10 mg via ORAL
  Filled 2022-02-01 (×11): qty 1

## 2022-02-01 MED ORDER — DOCUSATE SODIUM 100 MG PO CAPS
100.0000 mg | ORAL_CAPSULE | Freq: Two times a day (BID) | ORAL | Status: DC
  Administered 2022-02-01: 100 mg via ORAL
  Filled 2022-02-01: qty 1

## 2022-02-01 MED ORDER — METHOCARBAMOL 500 MG PO TABS
500.0000 mg | ORAL_TABLET | Freq: Four times a day (QID) | ORAL | Status: DC | PRN
  Administered 2022-02-01 – 2022-02-03 (×5): 500 mg via ORAL
  Filled 2022-02-01 (×6): qty 1

## 2022-02-01 MED ORDER — LOSARTAN POTASSIUM 50 MG PO TABS: 100.0000 mg | ORAL_TABLET | Freq: Once | ORAL | Status: DC

## 2022-02-01 MED ORDER — AMLODIPINE BESYLATE 5 MG PO TABS: 5.0000 mg | ORAL_TABLET | Freq: Once | ORAL | Status: DC

## 2022-02-01 MED ORDER — FENOFIBRATE 54 MG PO TABS: 54.0000 mg | ORAL_TABLET | Freq: Every day | ORAL | Status: DC

## 2022-02-01 MED ORDER — AMLODIPINE BESYLATE 5 MG PO TABS: 5.0000 mg | ORAL_TABLET | Freq: Every day | ORAL | Status: DC

## 2022-02-01 MED ORDER — SODIUM CHLORIDE 1 G PO TABS
1.0000 g | ORAL_TABLET | Freq: Three times a day (TID) | ORAL | Status: DC
  Administered 2022-02-01 – 2022-02-09 (×24): 1 g via ORAL
  Filled 2022-02-01 (×26): qty 1

## 2022-02-01 MED ORDER — MELATONIN 10 MG PO TABS: 10.0000 mg | ORAL_TABLET | Freq: Every day | ORAL | Status: DC

## 2022-02-01 MED ORDER — LOSARTAN POTASSIUM 50 MG PO TABS
100.0000 mg | ORAL_TABLET | Freq: Every day | ORAL | Status: DC
  Administered 2022-02-01 – 2022-02-03 (×3): 100 mg via ORAL
  Filled 2022-02-01 (×3): qty 2

## 2022-02-01 MED ORDER — INSULIN ASPART 100 UNIT/ML IJ SOLN
0.0000 [IU] | Freq: Every day | INTRAMUSCULAR | Status: DC
  Administered 2022-02-02: 2 [IU] via SUBCUTANEOUS

## 2022-02-01 MED ORDER — ASPIRIN 81 MG PO TBEC: 81.0000 mg | DELAYED_RELEASE_TABLET | Freq: Every day | ORAL | Status: DC

## 2022-02-01 MED ORDER — EZETIMIBE 10 MG PO TABS: 10.0000 mg | ORAL_TABLET | Freq: Every day | ORAL | Status: DC

## 2022-02-01 MED ORDER — AMLODIPINE BESYLATE 5 MG PO TABS
5.0000 mg | ORAL_TABLET | Freq: Every day | ORAL | Status: DC
  Administered 2022-02-01 – 2022-02-09 (×9): 5 mg via ORAL
  Filled 2022-02-01 (×11): qty 1

## 2022-02-01 MED ORDER — FLUOXETINE HCL 40 MG PO CAPS: 40.0000 mg | ORAL_CAPSULE | Freq: Every day | ORAL | Status: DC

## 2022-02-01 MED ORDER — METHOCARBAMOL 1000 MG/10ML IJ SOLN: 500.0000 mg | Freq: Four times a day (QID) | INTRAVENOUS | Status: DC | PRN

## 2022-02-01 MED ORDER — POLYETHYLENE GLYCOL 3350 17 G PO PACK: 17.0000 g | PACK | Freq: Every day | ORAL | Status: DC | PRN

## 2022-02-01 MED ORDER — MELATONIN 5 MG PO TABS
10.0000 mg | ORAL_TABLET | Freq: Every day | ORAL | Status: DC
  Administered 2022-02-01 – 2022-02-08 (×8): 10 mg via ORAL
  Filled 2022-02-01 (×8): qty 2

## 2022-02-01 MED ORDER — MORPHINE SULFATE (PF) 2 MG/ML IV SOLN
2.0000 mg | INTRAVENOUS | Status: DC | PRN
  Administered 2022-02-01 – 2022-02-04 (×11): 2 mg via INTRAVENOUS
  Filled 2022-02-01 (×12): qty 1

## 2022-02-01 MED ORDER — FLUOXETINE HCL 20 MG PO CAPS
40.0000 mg | ORAL_CAPSULE | Freq: Every day | ORAL | Status: DC
  Administered 2022-02-02 – 2022-02-09 (×8): 40 mg via ORAL
  Filled 2022-02-01 (×9): qty 2

## 2022-02-01 MED ORDER — OXYCODONE HCL 5 MG PO TABS
5.0000 mg | ORAL_TABLET | ORAL | Status: DC | PRN
  Administered 2022-02-01 – 2022-02-02 (×2): 10 mg via ORAL
  Administered 2022-02-02: 5 mg via ORAL
  Administered 2022-02-03 (×2): 10 mg via ORAL
  Administered 2022-02-03: 5 mg via ORAL
  Administered 2022-02-03: 10 mg via ORAL
  Filled 2022-02-01 (×3): qty 2
  Filled 2022-02-01: qty 1
  Filled 2022-02-01 (×2): qty 2
  Filled 2022-02-01 (×2): qty 1

## 2022-02-01 MED ORDER — CLONAZEPAM 0.5 MG PO TABS
0.5000 mg | ORAL_TABLET | Freq: Two times a day (BID) | ORAL | Status: DC | PRN
  Administered 2022-02-01 – 2022-02-04 (×6): 0.5 mg via ORAL
  Filled 2022-02-01 (×7): qty 1

## 2022-02-01 MED ORDER — LOSARTAN POTASSIUM 50 MG PO TABS: 100.0000 mg | ORAL_TABLET | Freq: Every day | ORAL | Status: DC

## 2022-02-01 MED ORDER — FLUOXETINE HCL 20 MG PO CAPS
40.0000 mg | ORAL_CAPSULE | Freq: Once | ORAL | Status: AC
  Administered 2022-02-01: 40 mg via ORAL
  Filled 2022-02-01: qty 2

## 2022-02-01 MED ORDER — ONDANSETRON HCL 4 MG/2ML IJ SOLN: 4.0000 mg | Freq: Four times a day (QID) | INTRAMUSCULAR | Status: DC | PRN

## 2022-02-01 MED ORDER — ASPIRIN 81 MG PO TBEC
81.0000 mg | DELAYED_RELEASE_TABLET | Freq: Every day | ORAL | Status: DC
  Administered 2022-02-01 – 2022-02-05 (×5): 81 mg via ORAL
  Filled 2022-02-01 (×7): qty 1

## 2022-02-01 NOTE — ED Notes (Signed)
Gone for nerve block

## 2022-02-01 NOTE — ED Provider Triage Note (Signed)
Emergency Medicine Provider Triage Evaluation Note  Sander Stogner , a 76 y.o. female  was evaluated in triage.  Pt complains of fall that occurred last night around 9:39PM.  No head injury or LOC.  Family was able to assist to bed, however unable to bear weight this morning so family called EMS.  Pain endorsed to right hip/proximal femur.  She is on plavix.  Review of Systems  Positive: Fall, hip pain Negative: fever  Physical Exam  BP (!) 150/82   Pulse 91   Temp 97.9 F (36.6 C) (Oral)   Resp 18   SpO2 92%   Gen:   Awake, no distress   Resp:  Normal effort  MSK:   Moves extremities without difficulty  Other:  Tenderness to right hip and proximal thigh, no significant deformity or leg shortening, DP pulse intact, wiggling toes on command  Medical Decision Making  Medically screening exam initiated at 6:19 AM.  Appropriate orders placed.  Saylah Ribelin was informed that the remainder of the evaluation will be completed by another provider, this initial triage assessment does not replace that evaluation, and the importance of remaining in the ED until their evaluation is complete.  Fall, hip pain.  Occurred last night, not able to bear weight this AM.  No head injury or LOC.  She is on plavix.  Will check EKG, labs, hip/femur and chest films.   Larene Pickett, PA-C 02/01/22 [phone]

## 2022-02-01 NOTE — ED Provider Notes (Signed)
[facility] EMERGENCY DEPARTMENT Provider Note   CSN: [id number] Arrival date & time: 02/01/22  7793     History  Chief Complaint  Patient presents with  . Fall    Rebecca Barnes is a 76 y.o. female.   Fall  76 year old female presents emergency department after a fall about 9:00-10:00 last night. She lives at home with her daughter, but fall was unwitnessed by her family.  They state they heard a "thump" and came downstairs and the patient was laying on her right side.  She apparently attempted to get up to use the restroom and pointed out her shoes at bedside as if they tripped causing her to fall.  She was able to ambulate after the fall to use the restroom and then back to bed.  Around 4 AM this morning she began crying out.  She had wet the bed and was in pain.  She refused to walk which is when EMS was called.  Daughter [name] was contacted for history given patient is very poor historian.  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  Daughter also states she is "always crying out since his stroke so is hard to tell when she is in pain."  Daughter also states the patient has not fallen in over a year and a half and was cleared by physical therapy.  She has not taken her morning meds.  Pertinent positives and negatives from HPI difficult to obtain from patient.  She does complain of pain of right hip and knee.  Patient currently on Plavix and aspirin but unsure of head trauma.  Home Medications Prior to Admission medications   Medication Sig Start Date End Date Taking? Authorizing Provider  hydrOXYzine (ATARAX) 10 MG tablet TAKE 1 TABLET(10 MG) BY MOUTH EVERY 8 HOURS AS NEEDED FOR ANXIETY 01/19/22   Ngetich, Dinah C, NP  acetaminophen (TYLENOL) 325 MG tablet Take 650 mg by mouth every 6 (six) hours as needed for mild pain or headache. 06/13/20   [provider]  amLODipine (NORVASC) 5 MG tablet  TAKE 1 TABLET(5 MG) BY MOUTH DAILY 08/28/21   Ngetich, Dinah C, NP  aspirin EC 81 MG tablet Take 1 tablet (81 mg total) by mouth daily. Swallow whole. 04/09/21   [name], Monina C, NP  camphor-menthol (SARNA) lotion Apply topically as needed for itching. 12/11/21   Setzer, Lynnell Jude, PA-C  clonazePAM (KLONOPIN) 0.5 MG tablet Take 0.5 mg by mouth 2 (two) times daily as needed for anxiety. 08/25/21   [provider]  clopidogrel (PLAVIX) 75 MG tablet TAKE 1 TABLET(75 MG) BY MOUTH DAILY 01/04/22   Ngetich, Dinah C, NP  ezetimibe (ZETIA) 10 MG tablet Take 1 tablet (10 mg total) by mouth daily. 12/11/21   Setzer, Lynnell Jude, PA-C  fenofibrate 54 MG tablet TAKE 1 TABLET(54 MG) BY MOUTH DAILY 01/04/22   Ngetich, Dinah C, NP  FLUoxetine (PROZAC) 40 MG capsule Take one capsule by mouth once daily. 01/04/22   Ngetich, Dinah C, NP  losartan (COZAAR) 100 MG tablet TAKE 1 TABLET(100 MG) BY MOUTH DAILY 01/20/22   Ngetich, Dinah C, NP  melatonin 10 MG TABS Take 10 mg by mouth at bedtime. 12/11/21   Setzer, Lynnell Jude, PA-C  metFORMIN (GLUCOPHAGE) 500 MG tablet TAKE 1 TABLET(500 MG) BY MOUTH DAILY WITH BREAKFAST 01/13/22   Ngetich, Dinah C, NP  Multiple Vitamin (MULTI-VITAMIN) tablet Take 1 tablet by mouth daily. 04/09/21   [name],  Monina C, NP  oxymetazoline (AFRIN NASAL SPRAY) 0.05 % nasal spray Spray 1 spray into affected nostril if bleeding is uncontrolled after 20 min of direct pressure 12/18/21   Raynald Blend R, PA-C  sodium chloride 1 g tablet TAKE 1 TABLET(1 GRAM) BY MOUTH THREE TIMES DAILY 11/30/21   Ngetich, Dinah C, NP      Allergies    Januvia [sitagliptin], Statins, and Sulfa antibiotics    Review of Systems   Review of Systems  Reason unable to perform ROS: Right lower extremity pain.    Physical Exam Updated Vital Signs BP (!) 164/79   Pulse 95   Temp 97.9 F (36.6 C) (Oral)   Resp (!) 21   Ht  (1.651 m)   Wt 102.1 kg   SpO2 99%   BMI 37.44 kg/m  Physical Exam Vitals and  nursing note reviewed.  Constitutional:      Appearance: She is well-developed. She is obese.  HENT:     Head: Normocephalic and atraumatic.  Eyes:     General:        Right eye: No discharge.        Left eye: No discharge.     Extraocular Movements: Extraocular movements intact.     Conjunctiva/sclera: Conjunctivae normal.     Pupils: Pupils are equal, round, and reactive to light.  Cardiovascular:     Rate and Rhythm: Normal rate and regular rhythm.     Pulses: Normal pulses.     Heart sounds: Normal heart sounds. No murmur heard. Pulmonary:     Effort: Pulmonary effort is normal. No respiratory distress.     Breath sounds: Normal breath sounds.  Abdominal:     General: Abdomen is flat.     Palpations: Abdomen is soft.     Tenderness: There is no abdominal tenderness. There is no guarding.  Musculoskeletal:        General: No swelling.     Cervical back: Normal range of motion and neck supple.     Comments: No midline tenderness radial pulses of cervical, thoracic, lumbar spine.  No tenderness to palpation along upper extremity.  Tenderness to palpation of proximal right femur as well as right knee.  Minimal ecchymosis and swelling on anterior right knee.  No fracture noted on x-ray.  Posterior tibial pulses full and intact bilaterally.  Patient complaining of no sensory deficits along upper and lower extremities along major nerve distributions.  Patient able to move all 4 extremities.    Skin:    General: Skin is warm and dry.     Capillary Refill: Capillary refill takes less than 2 seconds.  Neurological:     General: No focal deficit present.     Mental Status: She is alert and oriented to person, place, and time.  Psychiatric:        Mood and Affect: Mood normal.        Behavior: Behavior normal.     ED Results / Procedures / Treatments   Labs (all labs ordered are listed, but only abnormal results are displayed) Labs Reviewed  CBC WITH DIFFERENTIAL/PLATELET -  Abnormal; Notable for the following components:      Result Value   WBC 12.5 (*)    Neutro Abs 10.3 (*)    Monocytes Absolute 1.1 (*)    All other components within normal limits  COMPREHENSIVE METABOLIC PANEL - Abnormal; Notable for the following components:   Glucose, Bld 147 (*)    All other  components within normal limits  URINE CULTURE  PROTIME-INR  URINALYSIS, ROUTINE W REFLEX MICROSCOPIC  TYPE AND SCREEN  ABO/RH    EKG EKG Interpretation  Date/Time:  Monday February 01 2022 06:38:59 EDT Ventricular Rate:  90 PR Interval:  186 QRS Duration: 103 QT Interval:  364 QTC Calculation: 446 R Axis:   -3 Text Interpretation: Sinus rhythm Left atrial enlargement Baseline wander in lead(s) V2 Confirmed by Tilden Fossa [phone]) on 02/01/2022 6:45:18 AM  Radiology CT Head Wo Contrast  Result Date: 02/01/2022 CLINICAL DATA:  76 year old female with history of minor head trauma. EXAM: CT HEAD WITHOUT CONTRAST CT CERVICAL SPINE WITHOUT CONTRAST TECHNIQUE: Multidetector CT imaging of the head and cervical spine was performed following the standard protocol without intravenous contrast. Multiplanar CT image reconstructions of the cervical spine were also generated. RADIATION DOSE REDUCTION: This exam was performed according to the departmental dose-optimization program which includes automated exposure control, adjustment of the mA and/or kV according to patient size and/or use of iterative reconstruction technique. COMPARISON:  Head CT 12/02/2021. Brain MRI 12/03/2021. CT angiography of the neck 12/04/2021. 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 Mild  cerebral atrophy. Physiologic calcifications in the basal ganglia incidentally noted. Patchy and confluent areas of decreased attenuation are noted throughout the deep and periventricular white matter of the cerebral hemispheres bilaterally, compatible with chronic microvascular ischemic disease. No evidence of acute infarction, hemorrhage, hydrocephalus, extra-axial collection or mass lesion/mass effect. Vascular: Hyperdense left MCA. Atherosclerotic calcifications in the cerebral vasculature. Skull: Normal. Negative for fracture or focal lesion. Sinuses/Orbits: No acute finding. Other: None. CT CERVICAL SPINE FINDINGS Alignment: Normal. Skull base and vertebrae: No acute fracture. No primary bone lesion or focal pathologic process. Soft tissues and spinal canal: No prevertebral fluid or swelling. No visible canal hematoma. Disc levels: Multilevel degenerative disc disease, most pronounced at C4-C5 and C6-C7. Moderate multilevel facet arthropathy. Upper chest: Unremarkable. Other: None. IMPRESSION: 1. No definite acute intracranial abnormalities. 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 2. Extensive chronic microvascular ischemic changes in the cerebral white matter, as above. 3. No evidence of significant acute traumatic injury to the cervical spine. 4. Multilevel degenerative disc disease and cervical spondylosis, as above. Electronically Signed   By: Trudie Reed M.D.   On: 02/01/2022 08:07   CT Cervical Spine Wo Contrast  Result Date: 02/01/2022 CLINICAL DATA:  76 year old female with history of minor head trauma. EXAM: CT HEAD WITHOUT CONTRAST CT CERVICAL SPINE WITHOUT CONTRAST TECHNIQUE: Multidetector CT imaging of the head and cervical spine was performed following the standard protocol without intravenous contrast. Multiplanar CT image reconstructions of the cervical spine were also generated. RADIATION DOSE REDUCTION: This exam was performed according to the departmental  dose-optimization program which includes automated exposure control, adjustment of the mA and/or kV according to patient size and/or use of iterative reconstruction technique. COMPARISON:  Head CT 12/02/2021. Brain MRI 12/03/2021. CT angiography of the neck 12/04/2021. FINDINGS: CT HEAD FINDINGS Brain: When compared to the prior head CT there has been interval evolution of the previously noted left MCA territory infarct, with extensive areas of low-attenuation throughout the left temporal lobe and to a lesser extent the left parieto-occipital region, compatible with  evolving encephalomalacia. 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 Mild cerebral atrophy. Physiologic calcifications in the basal ganglia incidentally noted. Patchy and confluent areas of decreased attenuation are noted throughout the deep and periventricular white matter of the cerebral hemispheres bilaterally, compatible with chronic microvascular ischemic disease. No evidence of acute infarction, hemorrhage, hydrocephalus, extra-axial collection or mass lesion/mass effect. Vascular: Hyperdense left MCA. Atherosclerotic calcifications in the cerebral vasculature. Skull: Normal. Negative for fracture or focal lesion. Sinuses/Orbits: No acute finding. Other: None. CT CERVICAL SPINE FINDINGS Alignment: Normal. Skull base and vertebrae: No acute fracture. No primary bone lesion or focal pathologic process. Soft tissues and spinal canal: No prevertebral fluid or swelling. No visible canal hematoma. Disc levels: Multilevel degenerative disc disease, most pronounced at C4-C5 and C6-C7. Moderate multilevel facet arthropathy. Upper chest: Unremarkable. Other: None. IMPRESSION: 1. No definite acute intracranial abnormalities. 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 2. Extensive chronic microvascular ischemic changes in the cerebral white matter, as above. 3. No evidence of significant acute traumatic injury to the  cervical spine. 4. Multilevel degenerative disc disease and cervical spondylosis, as above. Electronically Signed   By: Trudie Reed M.D.   On: 02/01/2022 08:07   DG FEMUR, MIN 2 VIEWS RIGHT  Result Date: 02/01/2022 CLINICAL DATA:  Fall with right hip pain. EXAM: RIGHT FEMUR 2 VIEWS; CHEST  1 VIEW; PELVIS - 1-2 VIEW COMPARISON:  Portable chest 12/02/2021. No prior pelvic or femur films. FINDINGS: Chest: There is mild cardiomegaly. Aorta is mildly ectatic with atherosclerosis of the transverse segment. Stable mediastinum. No vascular congestion is seen. The lungs hypoexpanded but generally clear with limited view bases. There are multiple overlying monitor wires. Osteopenia.  Chronic healed fracture mid shaft left clavicle. There are moderate degenerative changes of the shoulders. Thoracic spondylosis. AP pelvis, single view: Osteopenia. There is no evidence of pelvic fractures or diastasis. Mild symmetric degenerative arthrosis is noted of the hips SI joints and pubic symphysis. Advanced degenerative change visualized lower lumbar spine. Right femoral series: Osteopenia. There is an acute right femoral neck fracture but it is difficult to adequately characterize due to positioning. As best as can be seen, this appears to be an oblique mid to distal femoral neck fracture with mild degree of impaction and otherwise nondisplaced. There is degenerative arthrosis at the hip and moderately the femorotibial joint. There is no erosive arthropathy. There are patchy calcifications in the femoral artery. There is artifact from overlying clothing. IMPRESSION: 1. No evidence of acute chest process. Hypoinflated with mild cardiomegaly. 2. Acute right femoral neck fracture, not well seen but appears to be an oblique mid to distal femoral neck fracture with mild impaction. 3. Osteopenia and degenerative change. 4. Calcific plaques in the femoral artery.  Aortic atherosclerosis. Electronically Signed   By: Almira Bar  M.D.   On: 02/01/2022 07:17   DG Chest 1 View  Result Date: 02/01/2022 CLINICAL DATA:  Fall with right hip pain. EXAM: RIGHT FEMUR 2 VIEWS; CHEST  1 VIEW; PELVIS - 1-2 VIEW COMPARISON:  Portable chest 12/02/2021. No prior pelvic or femur films. FINDINGS: Chest: There is mild cardiomegaly. Aorta is mildly ectatic with atherosclerosis of the transverse segment. Stable mediastinum. No vascular congestion is seen. The lungs hypoexpanded but generally clear with limited view bases. There are multiple overlying monitor wires. Osteopenia.  Chronic healed fracture mid shaft left clavicle. There are moderate degenerative changes of the shoulders. Thoracic spondylosis. AP pelvis, single view: Osteopenia. There is no evidence of  pelvic fractures or diastasis. Mild symmetric degenerative arthrosis is noted of the hips SI joints and pubic symphysis. Advanced degenerative change visualized lower lumbar spine. Right femoral series: Osteopenia. There is an acute right femoral neck fracture but it is difficult to adequately characterize due to positioning. As best as can be seen, this appears to be an oblique mid to distal femoral neck fracture with mild degree of impaction and otherwise nondisplaced. There is degenerative arthrosis at the hip and moderately the femorotibial joint. There is no erosive arthropathy. There are patchy calcifications in the femoral artery. There is artifact from overlying clothing. IMPRESSION: 1. No evidence of acute chest process. Hypoinflated with mild cardiomegaly. 2. Acute right femoral neck fracture, not well seen but appears to be an oblique mid to distal femoral neck fracture with mild impaction. 3. Osteopenia and degenerative change. 4. Calcific plaques in the femoral artery.  Aortic atherosclerosis. Electronically Signed   By: Almira Bar M.D.   On: 02/01/2022 07:17   DG Pelvis 1-2 Views  Result Date: 02/01/2022 CLINICAL DATA:  Fall with right hip pain. EXAM: RIGHT FEMUR 2 VIEWS;  CHEST  1 VIEW; PELVIS - 1-2 VIEW COMPARISON:  Portable chest 12/02/2021. No prior pelvic or femur films. FINDINGS: Chest: There is mild cardiomegaly. Aorta is mildly ectatic with atherosclerosis of the transverse segment. Stable mediastinum. No vascular congestion is seen. The lungs hypoexpanded but generally clear with limited view bases. There are multiple overlying monitor wires. Osteopenia.  Chronic healed fracture mid shaft left clavicle. There are moderate degenerative changes of the shoulders. Thoracic spondylosis. AP pelvis, single view: Osteopenia. There is no evidence of pelvic fractures or diastasis. Mild symmetric degenerative arthrosis is noted of the hips SI joints and pubic symphysis. Advanced degenerative change visualized lower lumbar spine. Right femoral series: Osteopenia. There is an acute right femoral neck fracture but it is difficult to adequately characterize due to positioning. As best as can be seen, this appears to be an oblique mid to distal femoral neck fracture with mild degree of impaction and otherwise nondisplaced. There is degenerative arthrosis at the hip and moderately the femorotibial joint. There is no erosive arthropathy. There are patchy calcifications in the femoral artery. There is artifact from overlying clothing. IMPRESSION: 1. No evidence of acute chest process. Hypoinflated with mild cardiomegaly. 2. Acute right femoral neck fracture, not well seen but appears to be an oblique mid to distal femoral neck fracture with mild impaction. 3. Osteopenia and degenerative change. 4. Calcific plaques in the femoral artery.  Aortic atherosclerosis. Electronically Signed   By: Almira Bar M.D.   On: 02/01/2022 07:17    Procedures Procedures    Medications Ordered in ED Medications  FLUoxetine (PROZAC) capsule 40 mg (has no administration in time range)  hydrOXYzine (ATARAX) tablet 10 mg (has no administration in time range)  morphine (PF) 4 MG/ML injection 4 mg (4 mg  Intravenous Given 02/01/22 0746)    ED Course/ Medical Decision Making/ A&P Clinical Course as of 02/01/22 0907  Mon Feb 01, 2022  0722 Daughter was contacted regarding patient to clarify history [CR]  0757 Ortho Dale Taney PA-C contacted concerning patient. They agreed to see patient once admitted [CR]  0903 Hospitalist Dr. Kevan Ny was consulted concerning the patient.  She agreed with admission as well as assuming further patient care. [CR]    Clinical Course User Index [CR] Peter Garter, [place]  Medical Decision Making  This patient presents to the ED for concern of Fall, this involves an extensive number of treatment options, and is a complaint that carries with it a high risk of complications and morbidity.  The differential diagnosis includes intracranial hemorrhage, pelvic/hip fx, strain/sprain, syncope, arrhythmia,    Co morbidities that complicate the patient evaluation  Diabetes mellitus, CVA, hypertension, anxiety, expressive aphasia, obesity   Additional history obtained:  Additional history obtained from CT of head from 12/04/2021 External records from outside source obtained and reviewed including acute infarct within left temporal, parietal and occipital lobes of left MCA/PCA   Lab Tests:  I Ordered, and personally interpreted labs.  The pertinent results include:  WBC 12.5, UA pending upon admission.   Imaging Studies ordered:  I ordered imaging studies including CT head and C-spine, femur, chest, pelvis x-ray I independently visualized and interpreted imaging which showed  CT head and C-spine: Negative for any acute intracranial abnormality.  Evolution of previously noted left MCA infarct as above.  Extensive chronic microvascular ischemic changes in the cerebral white matter.  No evidence of significant acute traumatic injury to the cervical spine.  Multilevel degenerative disc disease and cervical spondylosis I agree with the  radiologist interpretation  Cardiac Monitoring: / EKG:  The patient was maintained on a cardiac monitor.  I personally viewed and interpreted the cardiac monitored which showed an underlying rhythm of: Sinus rhythm   Consultations Obtained:  I requested consultation with the Ortho Dale DurhamMichael Jeffries PA-C and [facility]. Ophelia CharterYates,  and discussed lab and imaging findings as well as pertinent plan - they recommend: admission and assuming further treatment/care of patient.    Problem List / ED Course / Critical interventions / Medication management  Right femoral neck fracture I ordered medication including morphine for pain.  Prozac and Atarax for continuation of at home antianxiety medicine.   Reevaluation of the patient after these medicines showed that the patient improved I have reviewed the patients home medicines and have made adjustments as needed   Social Determinants of Health:  Dependent on family   Test / Admission - Considered:  Right femoral neck fracture Vitals signs significant for hypertension with a blood pressure 164/79.. Otherwise within normal range and stable throughout visit. Laboratory/imaging studies significant for: Right femoral neck fracture. Due to required fracture, admission deemed most necessary.  Most likely cause of fall is mechanical although other causes cannot be ruled out at this point due to poor patient history.  Ortho was consulted and they agreed to see the patient.  Hospital medicine agreed with admission. The patient appears reasonably stabilized for admission considering the current resources, flow, and capabilities available in the ED at this time, and I doubt any other [facility] further screening and/or treatment in the ED prior to admission.           Final Clinical Impression(s) / ED Diagnoses Final diagnoses:  Closed displaced fracture of right femoral neck (HCC)  Fall, initial encounter    Rx / DC Orders ED  Discharge Orders     None         Peter GarterRobbins, Malyk Girouard A, [place] 02/01/22 [id number]    Rozelle LoganHorton, Kristie M, DO 02/01/22 1557

## 2022-02-01 NOTE — Anesthesia Preprocedure Evaluation (Signed)
Anesthesia Evaluation  Patient identified by MRN, date of birth, ID band Patient awake    Reviewed: Allergy & Precautions, NPO status , Patient's Chart, lab work & pertinent test results  Airway Mallampati: II  TM Distance: >3 FB Neck ROM: Full    Dental no notable dental hx.    Pulmonary asthma ,    Pulmonary exam normal breath sounds clear to auscultation       Cardiovascular hypertension, Pt. on medications + Peripheral Vascular Disease  Normal cardiovascular exam Rhythm:Regular Rate:Normal     Neuro/Psych Anxiety Depression CVA, Residual Symptoms negative psych ROS   GI/Hepatic negative GI ROS, Neg liver ROS,   Endo/Other  negative endocrine ROSdiabetes, Type 2  Renal/GU negative Renal ROS  negative genitourinary   Musculoskeletal negative musculoskeletal ROS (+)   Abdominal (+) + obese,   Peds negative pediatric ROS (+)  Hematology negative hematology ROS (+)   Anesthesia Other Findings   Reproductive/Obstetrics negative OB ROS                             Anesthesia Physical Anesthesia Plan  ASA: 3  Anesthesia Plan: Regional   Post-op Pain Management:    Induction: Intravenous  PONV Risk Score and Plan:   Airway Management Planned:   Additional Equipment:   Intra-op Plan:   Post-operative Plan:   Informed Consent: I have reviewed the patients History and Physical, chart, labs and discussed the procedure including the risks, benefits and alternatives for the proposed anesthesia with the patient or authorized representative who has indicated his/her understanding and acceptance.     Dental advisory given  Plan Discussed with: CRNA  Anesthesia Plan Comments:         Anesthesia Quick Evaluation

## 2022-02-01 NOTE — Consult Note (Signed)
Reason for Consult:Right hip fx Referring Physician: Jonah Blue Time called: 0740 Time at bedside: 0933   Rebecca Barnes is an 76 y.o. female.  HPI: Rebecca Barnes suffered an unwitnessed fall last night. However, family was able to get her up and she ambulated to the restroom and back to bed. This morning around 0400 she began crying out and refused to walk so EMS was called. Workup showed a right hip fx and orthopedic surgery was consulted. 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  Past Medical History:  Diagnosis Date   Anxiety    Asthma    Diabetes mellitus without complication (HCC)    Hypertension    Stroke [facility])     Past Surgical History:  Procedure Laterality Date   arm fracture     arm surgery   CHOLECYSTECTOMY      Family History  Problem Relation Age of Onset   Cancer Mother     Social History:  reports that she has never smoked. She has never used smokeless tobacco. She reports that she does not currently use drugs after having used the following drugs: Marijuana. She reports that she does not drink alcohol.  Allergies:  Allergies  Allergen Reactions   Januvia [Sitagliptin] Other (See Comments)    dizziness   Statins Other (See Comments)    Hospitalized on 2 occasions after trying statins. Patient became dizzy and increased fall risk   Sulfa Antibiotics Other (See Comments)    Childhood Allergy     Medications: I have reviewed the patient's current medications.  Results for orders placed or performed during the hospital encounter of 02/01/22 (from the past 48 hour(s))  CBC with Differential     Status: Abnormal   Collection Time: 02/01/22  6:30 AM  Result Value Ref Range   WBC 12.5 (H) 4.0 - 10.5 K/uL   RBC 5.08 3.87 - 5.11 MIL/uL   Hemoglobin 13.6 12.0 - 15.0 g/dL   HCT 21.3 08.6 - 57.8 %   MCV 81.1 80.0 - 100.0 fL   MCH 26.8 26.0 - 34.0 pg   MCHC 33.0 30.0 - 36.0 g/dL   RDW 46.9 62.9 - 52.8 %   Platelets 388 150 - 400 K/uL    nRBC 0.0 0.0 - 0.2 %   Neutrophils Relative % 82 %   Neutro Abs 10.3 (H) 1.7 - 7.7 K/uL   Lymphocytes Relative 8 %   Lymphs Abs 1.0 0.7 - 4.0 K/uL   Monocytes Relative 9 %   Monocytes Absolute 1.1 (H) 0.1 - 1.0 K/uL   Eosinophils Relative 0 %   Eosinophils Absolute 0.0 0.0 - 0.5 K/uL   Basophils Relative 0 %   Basophils Absolute 0.0 0.0 - 0.1 K/uL   Immature Granulocytes 1 %   Abs Immature Granulocytes 0.07 0.00 - 0.07 K/uL    Comment: Performed at [facility], [address] [place], [place] [address]  Comprehensive metabolic panel     Status: Abnormal   Collection Time: 02/01/22  6:30 AM  Result Value Ref Range   Sodium 135 135 - 145 mmol/L   Potassium 3.6 3.5 - 5.1 mmol/L   Chloride 101 98 - 111 mmol/L   CO2 22 22 - 32 mmol/L   Glucose, Bld 147 (H) 70 - 99 mg/dL    Comment: Glucose reference range applies only to samples taken after fasting for at least 8 hours.   BUN 10 8 - 23 mg/dL  Creatinine, Ser 0.60 0.44 - 1.00 mg/dL   Calcium 9.4 8.9 - 65.710.3 mg/dL   Total Protein 7.1 6.5 - 8.1 g/dL   Albumin 3.7 3.5 - 5.0 g/dL   AST 18 15 - 41 U/L   ALT 13 0 - 44 U/L   Alkaline Phosphatase 80 38 - 126 U/L   Total Bilirubin 1.0 0.3 - 1.2 mg/dL   GFR, Estimated >84>60 >69>60 mL/min    Comment: (NOTE) Calculated using the CKD-EPI Creatinine Equation (2021)    Anion gap 12 5 - 15    Comment: Performed at [facility], [address] [place], [place] [address]  Protime-INR     Status: None   Collection Time: 02/01/22  6:30 AM  Result Value Ref Range   Prothrombin Time 13.7 11.4 - 15.2 seconds   INR 1.1 0.8 - 1.2    Comment: (NOTE) INR goal varies based on device and disease states. Performed at [facility], [address] [place], [place] [address]   Type and screen     Status: None   Collection Time: 02/01/22  6:30 AM  Result Value Ref Range   ABO/RH(D) O NEG    Antibody Screen NEG    Sample Expiration      02/04/2022,2359 Performed at [facility], [address] [place], [place] [address]     CT Head Wo Contrast  Result Date: 02/01/2022 CLINICAL DATA:  76 year old female with history of minor head trauma. EXAM: CT HEAD WITHOUT CONTRAST CT CERVICAL SPINE WITHOUT CONTRAST TECHNIQUE: Multidetector CT imaging of the head and cervical spine was performed following the standard protocol without intravenous contrast. Multiplanar CT image reconstructions of the cervical spine were also generated. RADIATION DOSE REDUCTION: This exam was performed according to the departmental dose-optimization program which includes automated exposure control, adjustment of the mA and/or kV according to patient size and/or use of iterative reconstruction technique. COMPARISON:  Head CT 12/02/2021. Brain MRI 12/03/2021. CT angiography of the neck 12/04/2021. 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 Mild cerebral atrophy. Physiologic calcifications in the basal ganglia incidentally noted. Patchy and confluent areas of decreased attenuation are noted throughout the deep and periventricular white matter of the cerebral hemispheres bilaterally, compatible with chronic microvascular ischemic disease. No evidence of acute infarction, hemorrhage, hydrocephalus, extra-axial collection or mass lesion/mass effect. Vascular: Hyperdense left MCA. Atherosclerotic calcifications in the cerebral vasculature. Skull: Normal. Negative for fracture or focal lesion. Sinuses/Orbits: No acute finding. Other: None. CT CERVICAL SPINE FINDINGS Alignment: Normal. Skull base and vertebrae: No acute fracture. No primary bone lesion or focal pathologic  process. Soft tissues and spinal canal: No prevertebral fluid or swelling. No visible canal hematoma. Disc levels: Multilevel degenerative disc disease, most pronounced at C4-C5 and C6-C7. Moderate multilevel facet arthropathy. Upper chest: Unremarkable. Other: None. IMPRESSION: 1. No definite acute intracranial abnormalities. 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 2. Extensive chronic microvascular ischemic changes in the cerebral white matter, as above. 3. No evidence of significant acute traumatic injury to the cervical spine. 4. Multilevel degenerative disc disease and cervical spondylosis, as above. Electronically Signed   By: Trudie Reedaniel  Entrikin M.D.   On: 02/01/2022 08:07  CT Cervical Spine Wo Contrast  Result Date: 02/01/2022 CLINICAL DATA:  76 year old female with history of minor head trauma. EXAM: CT HEAD WITHOUT CONTRAST CT CERVICAL SPINE WITHOUT CONTRAST TECHNIQUE: Multidetector CT imaging of the head and cervical spine was performed following the standard protocol without intravenous contrast. Multiplanar CT image reconstructions of the cervical spine were also generated. RADIATION DOSE REDUCTION: This exam was performed according to the departmental dose-optimization program which includes automated exposure control, adjustment of the mA and/or kV according to patient size and/or use of iterative reconstruction technique. COMPARISON:  Head CT 12/02/2021. Brain MRI 12/03/2021. CT angiography of the neck 12/04/2021. 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 Mild cerebral atrophy. Physiologic calcifications in the basal ganglia  incidentally noted. Patchy and confluent areas of decreased attenuation are noted throughout the deep and periventricular white matter of the cerebral hemispheres bilaterally, compatible with chronic microvascular ischemic disease. No evidence of acute infarction, hemorrhage, hydrocephalus, extra-axial collection or mass lesion/mass effect. Vascular: Hyperdense left MCA. Atherosclerotic calcifications in the cerebral vasculature. Skull: Normal. Negative for fracture or focal lesion. Sinuses/Orbits: No acute finding. Other: None. CT CERVICAL SPINE FINDINGS Alignment: Normal. Skull base and vertebrae: No acute fracture. No primary bone lesion or focal pathologic process. Soft tissues and spinal canal: No prevertebral fluid or swelling. No visible canal hematoma. Disc levels: Multilevel degenerative disc disease, most pronounced at C4-C5 and C6-C7. Moderate multilevel facet arthropathy. Upper chest: Unremarkable. Other: None. IMPRESSION: 1. No definite acute intracranial abnormalities. 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 2. Extensive chronic microvascular ischemic changes in the cerebral white matter, as above. 3. No evidence of significant acute traumatic injury to the cervical spine. 4. Multilevel degenerative disc disease and cervical spondylosis, as above. Electronically Signed   By: Trudie Reed M.D.   On: 02/01/2022 08:07   DG FEMUR, MIN 2 VIEWS RIGHT  Result Date: 02/01/2022 CLINICAL DATA:  Fall with right hip pain. EXAM: RIGHT FEMUR 2 VIEWS; CHEST  1 VIEW; PELVIS - 1-2 VIEW COMPARISON:  Portable chest 12/02/2021. No prior pelvic or femur films. FINDINGS: Chest: There is mild cardiomegaly. Aorta is mildly ectatic with atherosclerosis of the transverse segment. Stable mediastinum. No vascular congestion is seen. The lungs hypoexpanded but generally clear with limited view bases. There are multiple overlying monitor wires. Osteopenia.  Chronic healed fracture mid shaft left  clavicle. There are moderate degenerative changes of the shoulders. Thoracic spondylosis. AP pelvis, single view: Osteopenia. There is no evidence of pelvic fractures or diastasis. Mild symmetric degenerative arthrosis is noted of the hips SI joints and pubic symphysis. Advanced degenerative change visualized lower lumbar spine. Right femoral series: Osteopenia. There is an acute right femoral neck fracture but it is difficult to adequately characterize due to positioning. As best as can be seen, this appears to be an oblique mid to distal femoral neck fracture with mild degree of impaction and otherwise nondisplaced. There is degenerative arthrosis at the hip and moderately the femorotibial joint. There is no erosive arthropathy. There are patchy calcifications in the femoral artery. There is artifact from overlying clothing. IMPRESSION: 1. No evidence of acute chest process. Hypoinflated with mild cardiomegaly. 2. Acute right femoral neck fracture, not well seen  but appears to be an oblique mid to distal femoral neck fracture with mild impaction. 3. Osteopenia and degenerative change. 4. Calcific plaques in the femoral artery.  Aortic atherosclerosis. Electronically Signed   By: Almira Bar M.D.   On: 02/01/2022 07:17   DG Chest 1 View  Result Date: 02/01/2022 CLINICAL DATA:  Fall with right hip pain. EXAM: RIGHT FEMUR 2 VIEWS; CHEST  1 VIEW; PELVIS - 1-2 VIEW COMPARISON:  Portable chest 12/02/2021. No prior pelvic or femur films. FINDINGS: Chest: There is mild cardiomegaly. Aorta is mildly ectatic with atherosclerosis of the transverse segment. Stable mediastinum. No vascular congestion is seen. The lungs hypoexpanded but generally clear with limited view bases. There are multiple overlying monitor wires. Osteopenia.  Chronic healed fracture mid shaft left clavicle. There are moderate degenerative changes of the shoulders. Thoracic spondylosis. AP pelvis, single view: Osteopenia. There is no evidence of  pelvic fractures or diastasis. Mild symmetric degenerative arthrosis is noted of the hips SI joints and pubic symphysis. Advanced degenerative change visualized lower lumbar spine. Right femoral series: Osteopenia. There is an acute right femoral neck fracture but it is difficult to adequately characterize due to positioning. As best as can be seen, this appears to be an oblique mid to distal femoral neck fracture with mild degree of impaction and otherwise nondisplaced. There is degenerative arthrosis at the hip and moderately the femorotibial joint. There is no erosive arthropathy. There are patchy calcifications in the femoral artery. There is artifact from overlying clothing. IMPRESSION: 1. No evidence of acute chest process. Hypoinflated with mild cardiomegaly. 2. Acute right femoral neck fracture, not well seen but appears to be an oblique mid to distal femoral neck fracture with mild impaction. 3. Osteopenia and degenerative change. 4. Calcific plaques in the femoral artery.  Aortic atherosclerosis. Electronically Signed   By: Almira Bar M.D.   On: 02/01/2022 07:17   DG Pelvis 1-2 Views  Result Date: 02/01/2022 CLINICAL DATA:  Fall with right hip pain. EXAM: RIGHT FEMUR 2 VIEWS; CHEST  1 VIEW; PELVIS - 1-2 VIEW COMPARISON:  Portable chest 12/02/2021. No prior pelvic or femur films. FINDINGS: Chest: There is mild cardiomegaly. Aorta is mildly ectatic with atherosclerosis of the transverse segment. Stable mediastinum. No vascular congestion is seen. The lungs hypoexpanded but generally clear with limited view bases. There are multiple overlying monitor wires. Osteopenia.  Chronic healed fracture mid shaft left clavicle. There are moderate degenerative changes of the shoulders. Thoracic spondylosis. AP pelvis, single view: Osteopenia. There is no evidence of pelvic fractures or diastasis. Mild symmetric degenerative arthrosis is noted of the hips SI joints and pubic symphysis. Advanced degenerative  change visualized lower lumbar spine. Right femoral series: Osteopenia. There is an acute right femoral neck fracture but it is difficult to adequately characterize due to positioning. As best as can be seen, this appears to be an oblique mid to distal femoral neck fracture with mild degree of impaction and otherwise nondisplaced. There is degenerative arthrosis at the hip and moderately the femorotibial joint. There is no erosive arthropathy. There are patchy calcifications in the femoral artery. There is artifact from overlying clothing. IMPRESSION: 1. No evidence of acute chest process. Hypoinflated with mild cardiomegaly. 2. Acute right femoral neck fracture, not well seen but appears to be an oblique mid to distal femoral neck fracture with mild impaction. 3. Osteopenia and degenerative change. 4. Calcific plaques in the femoral artery.  Aortic atherosclerosis. Electronically Signed   By: Earlean Shawl.D.  On: 02/01/2022 07:17    Review of Systems  Unable to perform ROS: Patient nonverbal   Blood pressure (!) 164/79, pulse 95, temperature 97.9 F (36.6 C), temperature source Oral, resp. rate (!) 21, height 5\' 5"  (1.651 m), weight 102.1 kg, SpO2 99 %. Physical Exam Constitutional:      General: She is not in acute distress.    Appearance: She is well-developed. She is not diaphoretic.  HENT:     Head: Normocephalic and atraumatic.  Eyes:     General: No scleral icterus.       Right eye: No discharge.        Left eye: No discharge.     Conjunctiva/sclera: Conjunctivae normal.  Cardiovascular:     Rate and Rhythm: Normal rate and regular rhythm.  Pulmonary:     Effort: Pulmonary effort is normal. No respiratory distress.  Musculoskeletal:     Cervical back: Normal range of motion.     Comments: RLE No traumatic wounds, small ecchymosis medial knee, no rash  Hip and knee mod TTP  No knee or ankle effusion  Sens DPN, SPN, TN could not assess  Motor EHL, ext, flex, evers grossly  intact  DP 2+, PT 1+, No significant edema  Skin:    General: Skin is warm and dry.  Neurological:     Mental Status: She is alert.  Psychiatric:        Mood and Affect: Mood normal.        Behavior: Behavior normal.    Assessment/Plan: Right hip fx -- Plan THA tomorrow with Dr. . Please keep NPO after MN.    Linna Caprice, PA-C Orthopedic Surgery [phone] 02/01/2022, 9:37 AM

## 2022-02-01 NOTE — H&P (Signed)
History and Physical    PatientAilene Barnes: Rebecca Barnes ZOX:[id number] DOB: [birth year] DOA: 02/01/2022 DOS: the patient was seen and examined on 02/01/2022 PCP: Ngetich, Donalee Citrininah C, NP  Patient coming from: Home - lives with daughter; NOK: Daughter, Rebecca Barnes, [phone]   Chief Complaint: Fall  HPI: Rebecca Barnes is a 76 y.o. female with medical history significant of anxiety; HTN; DM; and CVA presenting with a fall. She was last admitted from 4/12-16 with an acute L MCA CVA and [facility]; she remained in CIR from 4/16-23 and at the time of discharge required supervision for transfers due to R visual field deficit, aphasia, and apraxia.  With her aphasia, she is really not able to effectively provide history, mostly sounds like word salad.  She does appear to deny pain.    I spoke with her daughter.  She had a stroke in April and it makes it very hard for her to communicate.  She has a 24/7 caregiver except she is independent at night.  She is trying to keep her at home.  She hasn't fallen at all since she has lived with her, about 6 months.  [facility]  They bought a new house to move her in with them.  [facility] but they all wanted to cohabitate.  She has been undergoing PT/ST at home.  Her daughter's preference would be CIR -> home again.  She may need more in-home support.  She is "so sad", cries all the time, she can't do the things that she loves to do like painting.  She wanted a DNR prior and her daughter wouldn't agree to it; she is now willing to agree to it now.      ER Course:  R femoral neck fracture.  CVA in April, persistent aphasia (stable).  Trying to go to the bathroom, ?fell over shoes, fell, R hip pain.  Head CT negative.  Ortho will see.     Review of Systems: unable to review all systems due to the inability of the patient to answer questions. Past Medical History:   Diagnosis Date   Anxiety    Asthma    Diabetes mellitus without complication (HCC)    Hypertension    Stroke [facility](HCC)    Past Surgical History:  Procedure Laterality Date   arm fracture     arm surgery   CHOLECYSTECTOMY     Social History:  reports that she has never smoked. She has never used smokeless tobacco. She reports that she does not currently use drugs after having used the following drugs: Marijuana. She reports that she does not drink alcohol.  Allergies  Allergen Reactions   Januvia [Sitagliptin] Other (See Comments)    dizziness   Statins Other (See Comments)    Hospitalized on 2 occasions after trying statins. Patient became dizzy and increased fall risk   Sulfa Antibiotics Other (See Comments)    Childhood Allergy     Family History  Problem Relation Age of Onset   Cancer Mother     Prior to Admission medications   Medication Sig Start Date End Date Taking? Authorizing Provider  hydrOXYzine (ATARAX) 10 MG tablet TAKE 1 TABLET(10 MG) BY MOUTH EVERY 8 HOURS AS NEEDED FOR ANXIETY 01/19/22   Ngetich, Dinah C, NP  acetaminophen (TYLENOL) 325 MG tablet Take 650 mg by mouth every 6 (six) hours as needed for mild pain or  headache. 06/13/20   [provider]  amLODipine (NORVASC) 5 MG tablet TAKE 1 TABLET(5 MG) BY MOUTH DAILY 08/28/21   Ngetich, Dinah C, NP  aspirin EC 81 MG tablet Take 1 tablet (81 mg total) by mouth daily. Swallow whole. 04/09/21   [name], Monina C, NP  camphor-menthol (SARNA) lotion Apply topically as needed for itching. 12/11/21   Setzer, Lynnell Jude, PA-C  clonazePAM (KLONOPIN) 0.5 MG tablet Take 0.5 mg by mouth 2 (two) times daily as needed for anxiety. 08/25/21   [provider]  clopidogrel (PLAVIX) 75 MG tablet TAKE 1 TABLET(75 MG) BY MOUTH DAILY 01/04/22   Ngetich, Dinah C, NP  ezetimibe (ZETIA) 10 MG tablet Take 1 tablet (10 mg total) by mouth daily. 12/11/21   Setzer, Lynnell Jude, PA-C  fenofibrate 54 MG tablet TAKE 1 TABLET(54 MG)  BY MOUTH DAILY 01/04/22   Ngetich, Dinah C, NP  FLUoxetine (PROZAC) 40 MG capsule Take one capsule by mouth once daily. 01/04/22   Ngetich, Dinah C, NP  losartan (COZAAR) 100 MG tablet TAKE 1 TABLET(100 MG) BY MOUTH DAILY 01/20/22   Ngetich, Dinah C, NP  melatonin 10 MG TABS Take 10 mg by mouth at bedtime. 12/11/21   Setzer, Lynnell Jude, PA-C  metFORMIN (GLUCOPHAGE) 500 MG tablet TAKE 1 TABLET(500 MG) BY MOUTH DAILY WITH BREAKFAST 01/13/22   Ngetich, Dinah C, NP  Multiple Vitamin (MULTI-VITAMIN) tablet Take 1 tablet by mouth daily. 04/09/21   [name], Monina C, NP  oxymetazoline (AFRIN NASAL SPRAY) 0.05 % nasal spray Spray 1 spray into affected nostril if bleeding is uncontrolled after 20 min of direct pressure 12/18/21   Raynald Blend R, PA-C  sodium chloride 1 g tablet TAKE 1 TABLET(1 GRAM) BY MOUTH THREE TIMES DAILY 11/30/21   Ngetich, Donalee Citrin, NP    Physical Exam: Vitals:   02/01/22 0626 02/01/22 0628 02/01/22 0640 02/01/22 0710  BP:    (!) 164/79  Pulse:   89 95  Resp:   13 (!) 21  Temp:      TempSrc:      SpO2: (!) 87%  99% 99%  Weight:  102.1 kg    Height:   (1.651 m)     General:  Appears calm and comfortable and is in NAD Eyes:  PERRL, EOMI, normal lids, iris ENT:  grossly normal hearing, lips & tongue, dry mm Neck:  no LAD, masses or thyromegaly Cardiovascular:  RRR, no m/r/g. No LE edema.  Respiratory:   CTA bilaterally with no wheezes/rales/rhonchi.  Normal respiratory effort. Abdomen:  soft, NT, ND Skin:  no rash or induration seen on limited exam Musculoskeletal:  No obvious LE deformity Lower extremity:  No LE edema.  Limited foot exam with no ulcerations.  2+ distal pulses. Psychiatric:  frustrated due to expressive aphasia, clearly trying hard to speak Neurologic:  unable to effectively perform   Radiological Exams on Admission: Independently reviewed - see discussion in A/P where applicable  CT Head Wo Contrast  Result Date: 02/01/2022 CLINICAL DATA:   76 year old female with history of minor head trauma. EXAM: CT HEAD WITHOUT CONTRAST CT CERVICAL SPINE WITHOUT CONTRAST TECHNIQUE: Multidetector CT imaging of the head and cervical spine was performed following the standard protocol without intravenous contrast. Multiplanar CT image reconstructions of the cervical spine were also generated. RADIATION DOSE REDUCTION: This exam was performed according to the departmental dose-optimization program which includes automated exposure control, adjustment of the mA and/or kV according to patient size and/or use of iterative  reconstruction technique. COMPARISON:  Head CT 12/02/2021. Brain MRI 12/03/2021. CT angiography of the neck 12/04/2021. 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 Mild cerebral atrophy. Physiologic calcifications in the basal ganglia incidentally noted. Patchy and confluent areas of decreased attenuation are noted throughout the deep and periventricular white matter of the cerebral hemispheres bilaterally, compatible with chronic microvascular ischemic disease. No evidence of acute infarction, hemorrhage, hydrocephalus, extra-axial collection or mass lesion/mass effect. Vascular: Hyperdense left MCA. Atherosclerotic calcifications in the cerebral vasculature. Skull: Normal. Negative for fracture or focal lesion. Sinuses/Orbits: No acute finding. Other: None. CT CERVICAL SPINE FINDINGS Alignment: Normal. Skull base and vertebrae: No acute fracture. No primary bone lesion or focal pathologic process. Soft tissues and spinal canal: No prevertebral fluid or swelling. No visible canal hematoma. Disc levels: Multilevel  degenerative disc disease, most pronounced at C4-C5 and C6-C7. Moderate multilevel facet arthropathy. Upper chest: Unremarkable. Other: None. IMPRESSION: 1. No definite acute intracranial abnormalities. 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 2. Extensive chronic microvascular ischemic changes in the cerebral white matter, as above. 3. No evidence of significant acute traumatic injury to the cervical spine. 4. Multilevel degenerative disc disease and cervical spondylosis, as above. Electronically Signed   By: Trudie Reed M.D.   On: 02/01/2022 08:07   CT Cervical Spine Wo Contrast  Result Date: 02/01/2022 CLINICAL DATA:  76 year old female with history of minor head trauma. EXAM: CT HEAD WITHOUT CONTRAST CT CERVICAL SPINE WITHOUT CONTRAST TECHNIQUE: Multidetector CT imaging of the head and cervical spine was performed following the standard protocol without intravenous contrast. Multiplanar CT image reconstructions of the cervical spine were also generated. RADIATION DOSE REDUCTION: This exam was performed according to the departmental dose-optimization program which includes automated exposure control, adjustment of the mA and/or kV according to patient size and/or use of iterative reconstruction technique. COMPARISON:  Head CT 12/02/2021. Brain MRI 12/03/2021. CT angiography of the neck 12/04/2021. 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 Mild cerebral atrophy. Physiologic calcifications in the basal ganglia incidentally noted. Patchy and confluent areas of decreased attenuation are noted throughout the deep and periventricular white  matter of the cerebral hemispheres bilaterally, compatible with chronic microvascular ischemic disease. No evidence of acute infarction, hemorrhage, hydrocephalus, extra-axial collection or mass lesion/mass effect. Vascular: Hyperdense left MCA. Atherosclerotic calcifications in the cerebral vasculature. Skull: Normal. Negative for fracture or focal lesion. Sinuses/Orbits: No acute finding. Other: None. CT CERVICAL SPINE FINDINGS Alignment: Normal. Skull base and vertebrae: No acute fracture. No primary bone lesion or focal pathologic process. Soft tissues and spinal canal: No prevertebral fluid or swelling. No visible canal hematoma. Disc levels: Multilevel degenerative disc disease, most pronounced at C4-C5 and C6-C7. Moderate multilevel facet arthropathy. Upper chest: Unremarkable. Other: None. IMPRESSION: 1. No definite acute intracranial abnormalities. 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 2. Extensive chronic microvascular ischemic changes in the cerebral white matter, as above. 3. No  evidence of significant acute traumatic injury to the cervical spine. 4. Multilevel degenerative disc disease and cervical spondylosis, as above. Electronically Signed   By: Trudie Reed M.D.   On: 02/01/2022 08:07   DG FEMUR, MIN 2 VIEWS RIGHT  Result Date: 02/01/2022 CLINICAL DATA:  Fall with right hip pain. EXAM: RIGHT FEMUR 2 VIEWS; CHEST  1 VIEW; PELVIS - 1-2 VIEW COMPARISON:  Portable chest 12/02/2021. No prior pelvic or femur films. FINDINGS: Chest: There is mild cardiomegaly. Aorta is mildly ectatic with atherosclerosis of the transverse segment. Stable mediastinum. No vascular congestion is seen. The lungs hypoexpanded but generally clear with limited view bases. There are multiple overlying monitor wires. Osteopenia.  Chronic healed fracture mid shaft left clavicle. There are moderate degenerative changes of the shoulders. Thoracic spondylosis. AP pelvis, single view: Osteopenia.  There is no evidence of pelvic fractures or diastasis. Mild symmetric degenerative arthrosis is noted of the hips SI joints and pubic symphysis. Advanced degenerative change visualized lower lumbar spine. Right femoral series: Osteopenia. There is an acute right femoral neck fracture but it is difficult to adequately characterize due to positioning. As best as can be seen, this appears to be an oblique mid to distal femoral neck fracture with mild degree of impaction and otherwise nondisplaced. There is degenerative arthrosis at the hip and moderately the femorotibial joint. There is no erosive arthropathy. There are patchy calcifications in the femoral artery. There is artifact from overlying clothing. IMPRESSION: 1. No evidence of acute chest process. Hypoinflated with mild cardiomegaly. 2. Acute right femoral neck fracture, not well seen but appears to be an oblique mid to distal femoral neck fracture with mild impaction. 3. Osteopenia and degenerative change. 4. Calcific plaques in the femoral artery.  Aortic atherosclerosis. Electronically Signed   By: Almira Bar M.D.   On: 02/01/2022 07:17   DG Chest 1 View  Result Date: 02/01/2022 CLINICAL DATA:  Fall with right hip pain. EXAM: RIGHT FEMUR 2 VIEWS; CHEST  1 VIEW; PELVIS - 1-2 VIEW COMPARISON:  Portable chest 12/02/2021. No prior pelvic or femur films. FINDINGS: Chest: There is mild cardiomegaly. Aorta is mildly ectatic with atherosclerosis of the transverse segment. Stable mediastinum. No vascular congestion is seen. The lungs hypoexpanded but generally clear with limited view bases. There are multiple overlying monitor wires. Osteopenia.  Chronic healed fracture mid shaft left clavicle. There are moderate degenerative changes of the shoulders. Thoracic spondylosis. AP pelvis, single view: Osteopenia. There is no evidence of pelvic fractures or diastasis. Mild symmetric degenerative arthrosis is noted of the hips SI joints and pubic symphysis.  Advanced degenerative change visualized lower lumbar spine. Right femoral series: Osteopenia. There is an acute right femoral neck fracture but it is difficult to adequately characterize due to positioning. As best as can be seen, this appears to be an oblique mid to distal femoral neck fracture with mild degree of impaction and otherwise nondisplaced. There is degenerative arthrosis at the hip and moderately the femorotibial joint. There is no erosive arthropathy. There are patchy calcifications in the femoral artery. There is artifact from overlying clothing. IMPRESSION: 1. No evidence of acute chest process. Hypoinflated with mild cardiomegaly. 2. Acute right femoral neck fracture, not well seen but appears to be an oblique mid to distal femoral neck fracture with mild impaction. 3. Osteopenia and degenerative change. 4. Calcific plaques in the femoral artery.  Aortic atherosclerosis. Electronically Signed   By: Almira Bar M.D.   On: 02/01/2022 07:17   DG Pelvis  1-2 Views  Result Date: 02/01/2022 CLINICAL DATA:  Fall with right hip pain. EXAM: RIGHT FEMUR 2 VIEWS; CHEST  1 VIEW; PELVIS - 1-2 VIEW COMPARISON:  Portable chest 12/02/2021. No prior pelvic or femur films. FINDINGS: Chest: There is mild cardiomegaly. Aorta is mildly ectatic with atherosclerosis of the transverse segment. Stable mediastinum. No vascular congestion is seen. The lungs hypoexpanded but generally clear with limited view bases. There are multiple overlying monitor wires. Osteopenia.  Chronic healed fracture mid shaft left clavicle. There are moderate degenerative changes of the shoulders. Thoracic spondylosis. AP pelvis, single view: Osteopenia. There is no evidence of pelvic fractures or diastasis. Mild symmetric degenerative arthrosis is noted of the hips SI joints and pubic symphysis. Advanced degenerative change visualized lower lumbar spine. Right femoral series: Osteopenia. There is an acute right femoral neck fracture but it  is difficult to adequately characterize due to positioning. As best as can be seen, this appears to be an oblique mid to distal femoral neck fracture with mild degree of impaction and otherwise nondisplaced. There is degenerative arthrosis at the hip and moderately the femorotibial joint. There is no erosive arthropathy. There are patchy calcifications in the femoral artery. There is artifact from overlying clothing. IMPRESSION: 1. No evidence of acute chest process. Hypoinflated with mild cardiomegaly. 2. Acute right femoral neck fracture, not well seen but appears to be an oblique mid to distal femoral neck fracture with mild impaction. 3. Osteopenia and degenerative change. 4. Calcific plaques in the femoral artery.  Aortic atherosclerosis. Electronically Signed   By: Almira Bar M.D.   On: 02/01/2022 07:17    EKG: Independently reviewed.  NSR with rate 90; no evidence of acute ischemia   Labs on Admission: I have personally reviewed the available labs and imaging studies at the time of the admission.  Pertinent labs:    Glucose 147 WBC 12.5 INR 1.1   Assessment and Plan: Principal Problem:   Hip fracture (HCC) Active Problems:   Anxiety with depression   Type 2 diabetes mellitus without complication, without long-term current use of insulin (HCC)   Essential hypertension   History of CVA (cerebrovascular accident)   Obesity (BMI 30-39.9)   Aphasia   Dyslipidemia   DNR (do not resuscitate)    R femoral neck fracture -Apparently mechanical fall resulting in hip fracture -Orthopedics consulted -NPO after midnight in anticipation of surgical repair tomorrow -SCDs overnight, start Lovenox post-operatively (or as per ortho) -Pain control with Tylenol, Robaxin, Oxycodone, and Morphine prn -TOC team consult for rehab placement - SNF vs. CIR -Will need PT consult post-operatively -Hip fracture order set utilized -TXA per orthopedics -Fascia iliacus block ordered per  anesthesia  Pre-operative stratification -Orthopedic/spinal surgery is associated with an intermediate (1-5%) cardiovascular risk for cardiac death and nonfatal MI -With her h/o CVA, her revised cardiac index gives a risk estimate of 6% -Because of this risk, she is recommended to have pre-operative EKG testing prior to surgery; this was done in the ER -Her Detsky's Modified Cardiac Risk Index score is Class II, with a moderate cardiac risk -It is reasonable for her to go to the OR without additional evaluation  Recent CVA with persistent aphasia -She remains on ASA and Plavix; will continue ASA and hold Plavix for now, needs to resume when able and should transition off ASA in July -She was discharged to [facility] after CVA in April and appears likely to benefit from admission back to CIR after current hospitalization -Her aphasia is obviously  frustrating for her -Her daughter is also struggling to care for her at home although clearly desires to continue to do so if possible  Mood d/o -Patient with apparent depression/frustration/anxiety since CVA -Continue fluoxetine, hydroxyzine, Klonopin  HTN -Continue Norvasc, losartan  HLD -Continue fenofibrate -Statin intolerant -Consider referral to lipid clinic  DM -Recent A1c was 6.3, indicating good control -hold Glucophage -Cover with moderate-scale SSI   Obesity -Body mass index is 37.44 kg/m..  -Weight loss should be encouraged -Outpatient PCP/bariatric medicine f/u encouraged   DNR -I have discussed code status with the patient's daughter and the patient would not desire resuscitation and would prefer to die a natural death should that situation arise. -She will need a gold out of facility DNR form at the time of discharge     Advance Care Planning:   Code Status: DNR   Consults: Orthopedics; SW, Nutrition; will need PT post-operatively   DVT Prophylaxis: SCDs until approved for Lovenox by orthopedics  Family Communication:  None present; I spoke with her daughter by telephone at the time of admission  Severity of Illness: The appropriate patient status for this patient is INPATIENT. Inpatient status is judged to be reasonable and necessary in order to provide the required intensity of service to ensure the patient's safety. The patient's presenting symptoms, physical exam findings, and initial radiographic and laboratory data in the context of their chronic comorbidities is felt to place them at high risk for further clinical deterioration. Furthermore, it is not anticipated that the patient will be medically stable for discharge from the hospital within 2 midnights of admission.   * I certify that at the point of admission it is my clinical judgment that the patient will require inpatient hospital care spanning beyond 2 midnights from the point of admission due to high intensity of service, high risk for further deterioration and high frequency of surveillance required.*  Author: Jonah Blue, MD 02/01/2022 10:55 AM  For on call review [email or web address].

## 2022-02-01 NOTE — Anesthesia Procedure Notes (Signed)
Anesthesia Regional Block: Femoral nerve block   Pre-Anesthetic Checklist: , timeout performed,  Correct Patient, Correct Site, Correct Laterality,  Correct Procedure, Correct Position, site marked,  Risks and benefits discussed,  Surgical consent,  Pre-op evaluation,  At surgeon's request and post-op pain management  Laterality: Right  Prep: chloraprep       Needles:  Injection technique: Single-shot  Needle Type: Stimiplex     Needle Length: 9cm  Needle Gauge: 21     Additional Needles:   Narrative:  Start time: 02/01/2022 11:46 AM End time: 02/01/2022 11:51 AM Injection made incrementally with aspirations every 5 mL.  Performed by: Personally  Anesthesiologist: Lowella Curb, MD

## 2022-02-01 NOTE — Progress Notes (Signed)
Patient arrived to 5N03 at 1850 from ED. When asked orientation questions, patient screamed "help me" repeatedly and when asked in pain she stated "yes" but could not provide other details. PRN morphine given. Skin assessed with Glee Arvin, RN. No evidence of pressure injuries, only MASD under bilateral breasts. BP 189/82, oncoming shift made aware and will recheck once patient relaxes.

## 2022-02-01 NOTE — H&P (View-Only) (Signed)
Reason for Consult:Right hip fx Referring Physician: Jennifer Yates Time called: 0740 Time at bedside: 0933   Rebecca Barnes is an 75 y.o. female.  HPI: Dalayza suffered an unwitnessed fall last night. However, family was able to get her up and she ambulated to the restroom and back to bed. This morning around 0400 she began crying out and refused to walk so EMS was called. Workup showed a right hip fx and orthopedic surgery was consulted. 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  Past Medical History:  Diagnosis Date   Anxiety    Asthma    Diabetes mellitus without complication (HCC)    Hypertension    Stroke (HCC)     Past Surgical History:  Procedure Laterality Date   arm fracture     arm surgery   CHOLECYSTECTOMY      Family History  Problem Relation Age of Onset   Cancer Mother     Social History:  reports that she has never smoked. She has never used smokeless tobacco. She reports that she does not currently use drugs after having used the following drugs: Marijuana. She reports that she does not drink alcohol.  Allergies:  Allergies  Allergen Reactions   Januvia [Sitagliptin] Other (See Comments)    dizziness   Statins Other (See Comments)    Hospitalized on 2 occasions after trying statins. Patient became dizzy and increased fall risk   Sulfa Antibiotics Other (See Comments)    Childhood Allergy     Medications: I have reviewed the patient's current medications.  Results for orders placed or performed during the hospital encounter of 02/01/22 (from the past 48 hour(s))  CBC with Differential     Status: Abnormal   Collection Time: 02/01/22  6:30 AM  Result Value Ref Range   WBC 12.5 (H) 4.0 - 10.5 K/uL   RBC 5.08 3.87 - 5.11 MIL/uL   Hemoglobin 13.6 12.0 - 15.0 g/dL   HCT 41.2 36.0 - 46.0 %   MCV 81.1 80.0 - 100.0 fL   MCH 26.8 26.0 - 34.0 pg   MCHC 33.0 30.0 - 36.0 g/dL   RDW 14.0 11.5 - 15.5 %   Platelets 388 150 - 400 K/uL    nRBC 0.0 0.0 - 0.2 %   Neutrophils Relative % 82 %   Neutro Abs 10.3 (H) 1.7 - 7.7 K/uL   Lymphocytes Relative 8 %   Lymphs Abs 1.0 0.7 - 4.0 K/uL   Monocytes Relative 9 %   Monocytes Absolute 1.1 (H) 0.1 - 1.0 K/uL   Eosinophils Relative 0 %   Eosinophils Absolute 0.0 0.0 - 0.5 K/uL   Basophils Relative 0 %   Basophils Absolute 0.0 0.0 - 0.1 K/uL   Immature Granulocytes 1 %   Abs Immature Granulocytes 0.07 0.00 - 0.07 K/uL    Comment: Performed at [facility], [address] [place], [place] [address]  Comprehensive metabolic panel     Status: Abnormal   Collection Time: 02/01/22  6:30 AM  Result Value Ref Range   Sodium 135 135 - 145 mmol/L   Potassium 3.6 3.5 - 5.1 mmol/L   Chloride 101 98 - 111 mmol/L   CO2 22 22 - 32 mmol/L   Glucose, Bld 147 (H) 70 - 99 mg/dL    Comment: Glucose reference range applies only to samples taken after fasting for at least 8 hours.   BUN 10 8 - 23 mg/dL     Creatinine, Ser 0.60 0.44 - 1.00 mg/dL   Calcium 9.4 8.9 - 65.710.3 mg/dL   Total Protein 7.1 6.5 - 8.1 g/dL   Albumin 3.7 3.5 - 5.0 g/dL   AST 18 15 - 41 U/L   ALT 13 0 - 44 U/L   Alkaline Phosphatase 80 38 - 126 U/L   Total Bilirubin 1.0 0.3 - 1.2 mg/dL   GFR, Estimated >84>60 >69>60 mL/min    Comment: (NOTE) Calculated using the CKD-EPI Creatinine Equation (2021)    Anion gap 12 5 - 15    Comment: Performed at [facility], [address] [place], [place] [address]  Protime-INR     Status: None   Collection Time: 02/01/22  6:30 AM  Result Value Ref Range   Prothrombin Time 13.7 11.4 - 15.2 seconds   INR 1.1 0.8 - 1.2    Comment: (NOTE) INR goal varies based on device and disease states. Performed at [facility], [address] [place], [place] [address]   Type and screen     Status: None   Collection Time: 02/01/22  6:30 AM  Result Value Ref Range   ABO/RH(D) O NEG    Antibody Screen NEG    Sample Expiration      02/04/2022,2359 Performed at [facility], [address] [place], [place] [address]     CT Head Wo Contrast  Result Date: 02/01/2022 CLINICAL DATA:  76 year old female with history of minor head trauma. EXAM: CT HEAD WITHOUT CONTRAST CT CERVICAL SPINE WITHOUT CONTRAST TECHNIQUE: Multidetector CT imaging of the head and cervical spine was performed following the standard protocol without intravenous contrast. Multiplanar CT image reconstructions of the cervical spine were also generated. RADIATION DOSE REDUCTION: This exam was performed according to the departmental dose-optimization program which includes automated exposure control, adjustment of the mA and/or kV according to patient size and/or use of iterative reconstruction technique. COMPARISON:  Head CT 12/02/2021. Brain MRI 12/03/2021. CT angiography of the neck 12/04/2021. 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 Mild cerebral atrophy. Physiologic calcifications in the basal ganglia incidentally noted. Patchy and confluent areas of decreased attenuation are noted throughout the deep and periventricular white matter of the cerebral hemispheres bilaterally, compatible with chronic microvascular ischemic disease. No evidence of acute infarction, hemorrhage, hydrocephalus, extra-axial collection or mass lesion/mass effect. Vascular: Hyperdense left MCA. Atherosclerotic calcifications in the cerebral vasculature. Skull: Normal. Negative for fracture or focal lesion. Sinuses/Orbits: No acute finding. Other: None. CT CERVICAL SPINE FINDINGS Alignment: Normal. Skull base and vertebrae: No acute fracture. No primary bone lesion or focal pathologic  process. Soft tissues and spinal canal: No prevertebral fluid or swelling. No visible canal hematoma. Disc levels: Multilevel degenerative disc disease, most pronounced at C4-C5 and C6-C7. Moderate multilevel facet arthropathy. Upper chest: Unremarkable. Other: None. IMPRESSION: 1. No definite acute intracranial abnormalities. 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 2. Extensive chronic microvascular ischemic changes in the cerebral white matter, as above. 3. No evidence of significant acute traumatic injury to the cervical spine. 4. Multilevel degenerative disc disease and cervical spondylosis, as above. Electronically Signed   By: Trudie Reedaniel  Entrikin M.D.   On: 02/01/2022 08:07  CT Cervical Spine Wo Contrast  Result Date: 02/01/2022 CLINICAL DATA:  76 year old female with history of minor head trauma. EXAM: CT HEAD WITHOUT CONTRAST CT CERVICAL SPINE WITHOUT CONTRAST TECHNIQUE: Multidetector CT imaging of the head and cervical spine was performed following the standard protocol without intravenous contrast. Multiplanar CT image reconstructions of the cervical spine were also generated. RADIATION DOSE REDUCTION: This exam was performed according to the departmental dose-optimization program which includes automated exposure control, adjustment of the mA and/or kV according to patient size and/or use of iterative reconstruction technique. COMPARISON:  Head CT 12/02/2021. Brain MRI 12/03/2021. CT angiography of the neck 12/04/2021. 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 Mild cerebral atrophy. Physiologic calcifications in the basal ganglia  incidentally noted. Patchy and confluent areas of decreased attenuation are noted throughout the deep and periventricular white matter of the cerebral hemispheres bilaterally, compatible with chronic microvascular ischemic disease. No evidence of acute infarction, hemorrhage, hydrocephalus, extra-axial collection or mass lesion/mass effect. Vascular: Hyperdense left MCA. Atherosclerotic calcifications in the cerebral vasculature. Skull: Normal. Negative for fracture or focal lesion. Sinuses/Orbits: No acute finding. Other: None. CT CERVICAL SPINE FINDINGS Alignment: Normal. Skull base and vertebrae: No acute fracture. No primary bone lesion or focal pathologic process. Soft tissues and spinal canal: No prevertebral fluid or swelling. No visible canal hematoma. Disc levels: Multilevel degenerative disc disease, most pronounced at C4-C5 and C6-C7. Moderate multilevel facet arthropathy. Upper chest: Unremarkable. Other: None. IMPRESSION: 1. No definite acute intracranial abnormalities. 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 2. Extensive chronic microvascular ischemic changes in the cerebral white matter, as above. 3. No evidence of significant acute traumatic injury to the cervical spine. 4. Multilevel degenerative disc disease and cervical spondylosis, as above. Electronically Signed   By: Trudie Reed M.D.   On: 02/01/2022 08:07   DG FEMUR, MIN 2 VIEWS RIGHT  Result Date: 02/01/2022 CLINICAL DATA:  Fall with right hip pain. EXAM: RIGHT FEMUR 2 VIEWS; CHEST  1 VIEW; PELVIS - 1-2 VIEW COMPARISON:  Portable chest 12/02/2021. No prior pelvic or femur films. FINDINGS: Chest: There is mild cardiomegaly. Aorta is mildly ectatic with atherosclerosis of the transverse segment. Stable mediastinum. No vascular congestion is seen. The lungs hypoexpanded but generally clear with limited view bases. There are multiple overlying monitor wires. Osteopenia.  Chronic healed fracture mid shaft left  clavicle. There are moderate degenerative changes of the shoulders. Thoracic spondylosis. AP pelvis, single view: Osteopenia. There is no evidence of pelvic fractures or diastasis. Mild symmetric degenerative arthrosis is noted of the hips SI joints and pubic symphysis. Advanced degenerative change visualized lower lumbar spine. Right femoral series: Osteopenia. There is an acute right femoral neck fracture but it is difficult to adequately characterize due to positioning. As best as can be seen, this appears to be an oblique mid to distal femoral neck fracture with mild degree of impaction and otherwise nondisplaced. There is degenerative arthrosis at the hip and moderately the femorotibial joint. There is no erosive arthropathy. There are patchy calcifications in the femoral artery. There is artifact from overlying clothing. IMPRESSION: 1. No evidence of acute chest process. Hypoinflated with mild cardiomegaly. 2. Acute right femoral neck fracture, not well seen  but appears to be an oblique mid to distal femoral neck fracture with mild impaction. 3. Osteopenia and degenerative change. 4. Calcific plaques in the femoral artery.  Aortic atherosclerosis. Electronically Signed   By: Almira Bar M.D.   On: 02/01/2022 07:17   DG Chest 1 View  Result Date: 02/01/2022 CLINICAL DATA:  Fall with right hip pain. EXAM: RIGHT FEMUR 2 VIEWS; CHEST  1 VIEW; PELVIS - 1-2 VIEW COMPARISON:  Portable chest 12/02/2021. No prior pelvic or femur films. FINDINGS: Chest: There is mild cardiomegaly. Aorta is mildly ectatic with atherosclerosis of the transverse segment. Stable mediastinum. No vascular congestion is seen. The lungs hypoexpanded but generally clear with limited view bases. There are multiple overlying monitor wires. Osteopenia.  Chronic healed fracture mid shaft left clavicle. There are moderate degenerative changes of the shoulders. Thoracic spondylosis. AP pelvis, single view: Osteopenia. There is no evidence of  pelvic fractures or diastasis. Mild symmetric degenerative arthrosis is noted of the hips SI joints and pubic symphysis. Advanced degenerative change visualized lower lumbar spine. Right femoral series: Osteopenia. There is an acute right femoral neck fracture but it is difficult to adequately characterize due to positioning. As best as can be seen, this appears to be an oblique mid to distal femoral neck fracture with mild degree of impaction and otherwise nondisplaced. There is degenerative arthrosis at the hip and moderately the femorotibial joint. There is no erosive arthropathy. There are patchy calcifications in the femoral artery. There is artifact from overlying clothing. IMPRESSION: 1. No evidence of acute chest process. Hypoinflated with mild cardiomegaly. 2. Acute right femoral neck fracture, not well seen but appears to be an oblique mid to distal femoral neck fracture with mild impaction. 3. Osteopenia and degenerative change. 4. Calcific plaques in the femoral artery.  Aortic atherosclerosis. Electronically Signed   By: Almira Bar M.D.   On: 02/01/2022 07:17   DG Pelvis 1-2 Views  Result Date: 02/01/2022 CLINICAL DATA:  Fall with right hip pain. EXAM: RIGHT FEMUR 2 VIEWS; CHEST  1 VIEW; PELVIS - 1-2 VIEW COMPARISON:  Portable chest 12/02/2021. No prior pelvic or femur films. FINDINGS: Chest: There is mild cardiomegaly. Aorta is mildly ectatic with atherosclerosis of the transverse segment. Stable mediastinum. No vascular congestion is seen. The lungs hypoexpanded but generally clear with limited view bases. There are multiple overlying monitor wires. Osteopenia.  Chronic healed fracture mid shaft left clavicle. There are moderate degenerative changes of the shoulders. Thoracic spondylosis. AP pelvis, single view: Osteopenia. There is no evidence of pelvic fractures or diastasis. Mild symmetric degenerative arthrosis is noted of the hips SI joints and pubic symphysis. Advanced degenerative  change visualized lower lumbar spine. Right femoral series: Osteopenia. There is an acute right femoral neck fracture but it is difficult to adequately characterize due to positioning. As best as can be seen, this appears to be an oblique mid to distal femoral neck fracture with mild degree of impaction and otherwise nondisplaced. There is degenerative arthrosis at the hip and moderately the femorotibial joint. There is no erosive arthropathy. There are patchy calcifications in the femoral artery. There is artifact from overlying clothing. IMPRESSION: 1. No evidence of acute chest process. Hypoinflated with mild cardiomegaly. 2. Acute right femoral neck fracture, not well seen but appears to be an oblique mid to distal femoral neck fracture with mild impaction. 3. Osteopenia and degenerative change. 4. Calcific plaques in the femoral artery.  Aortic atherosclerosis. Electronically Signed   By: Earlean Shawl.D.  On: 02/01/2022 07:17    Review of Systems  Unable to perform ROS: Patient nonverbal   Blood pressure (!) 164/79, pulse 95, temperature 97.9 F (36.6 C), temperature source Oral, resp. rate (!) 21, height 5\' 5"  (1.651 m), weight 102.1 kg, SpO2 99 %. Physical Exam Constitutional:      General: She is not in acute distress.    Appearance: She is well-developed. She is not diaphoretic.  HENT:     Head: Normocephalic and atraumatic.  Eyes:     General: No scleral icterus.       Right eye: No discharge.        Left eye: No discharge.     Conjunctiva/sclera: Conjunctivae normal.  Cardiovascular:     Rate and Rhythm: Normal rate and regular rhythm.  Pulmonary:     Effort: Pulmonary effort is normal. No respiratory distress.  Musculoskeletal:     Cervical back: Normal range of motion.     Comments: RLE No traumatic wounds, small ecchymosis medial knee, no rash  Hip and knee mod TTP  No knee or ankle effusion  Sens DPN, SPN, TN could not assess  Motor EHL, ext, flex, evers grossly  intact  DP 2+, PT 1+, No significant edema  Skin:    General: Skin is warm and dry.  Neurological:     Mental Status: She is alert.  Psychiatric:        Mood and Affect: Mood normal.        Behavior: Behavior normal.    Assessment/Plan: Right hip fx -- Plan THA tomorrow with Dr. . Please keep NPO after MN.    Linna Caprice, PA-C Orthopedic Surgery [phone] 02/01/2022, 9:37 AM

## 2022-02-01 NOTE — Progress Notes (Signed)
Inpatient Rehab Admissions Coordinator:   Consult received for acute inpatient rehab.  Will need PT/OT evaluations and further medical workup in order to determine whether pt is a candidate for CIR program.  We will screen patient for candidacy pending completion of therapy evaluations.   Estill Dooms, PT, DPT Admissions Coordinator [phone] 02/01/22  10:53 AM

## 2022-02-01 NOTE — Anesthesia Postprocedure Evaluation (Signed)
Anesthesia Post Note  Patient: Rebecca Barnes  Procedure(s) Performed: AN AD HOC NERVE BLOCK     Patient location during evaluation: PACU Anesthesia Type: Regional Level of consciousness: awake and alert Pain management: pain level controlled Vital Signs Assessment: post-procedure vital signs reviewed and stable Respiratory status: spontaneous breathing, nonlabored ventilation and respiratory function stable Cardiovascular status: blood pressure returned to baseline and stable Postop Assessment: no apparent nausea or vomiting Anesthetic complications: no   No notable events documented.  Last Vitals:  Vitals:   02/01/22 1130 02/01/22 1145  BP: (!) 164/75 (!) 170/75  Pulse: 92 86  Resp: 13 12  Temp:  36.8 C  SpO2: 99% 98%    Last Pain:  Vitals:   02/01/22 0627  TempSrc:   PainSc: 10-Worst pain ever                 Lowella Curb

## 2022-02-01 NOTE — ED Triage Notes (Signed)
Mechanical fall at home around 930pm. Was able to pivot and get into bed. Woke up 530am and was unable to move. C/o right hip pain.   Compliant on plaxiv. Denies head injury.

## 2022-02-01 NOTE — ED Notes (Signed)
Pt brought back to room - hooked up to monitor and EKG obtained - pt taken to xray

## 2022-02-01 NOTE — Plan of Care (Signed)

## 2022-02-02 ENCOUNTER — Inpatient Hospital Stay (HOSPITAL_COMMUNITY): Payer: Medicare Other

## 2022-02-02 ENCOUNTER — Encounter (HOSPITAL_COMMUNITY): Payer: Self-pay | Admitting: Internal Medicine

## 2022-02-02 ENCOUNTER — Inpatient Hospital Stay (HOSPITAL_COMMUNITY): Payer: Medicare Other | Admitting: Anesthesiology

## 2022-02-02 ENCOUNTER — Other Ambulatory Visit: Payer: Self-pay

## 2022-02-02 ENCOUNTER — Encounter (HOSPITAL_COMMUNITY)
Admission: EM | Disposition: A | Discharge status: Skilled Nursing Facility | Payer: Self-pay | Source: Home / Self Care | Attending: Internal Medicine

## 2022-02-02 DIAGNOSIS — E1151 Type 2 diabetes mellitus with diabetic peripheral angiopathy without gangrene: Secondary | ICD-10-CM | POA: Diagnosis not present

## 2022-02-02 DIAGNOSIS — R4701 Aphasia: Secondary | ICD-10-CM | POA: Diagnosis not present

## 2022-02-02 DIAGNOSIS — I639 Cerebral infarction, unspecified: Secondary | ICD-10-CM

## 2022-02-02 DIAGNOSIS — I10 Essential (primary) hypertension: Secondary | ICD-10-CM | POA: Diagnosis not present

## 2022-02-02 DIAGNOSIS — S72001A Fracture of unspecified part of neck of right femur, initial encounter for closed fracture: Secondary | ICD-10-CM

## 2022-02-02 DIAGNOSIS — F418 Other specified anxiety disorders: Secondary | ICD-10-CM | POA: Diagnosis not present

## 2022-02-02 DIAGNOSIS — Z7984 Long term (current) use of oral hypoglycemic drugs: Secondary | ICD-10-CM

## 2022-02-02 DIAGNOSIS — Z66 Do not resuscitate: Secondary | ICD-10-CM | POA: Diagnosis not present

## 2022-02-02 HISTORY — PX: TOTAL HIP ARTHROPLASTY: SHX124

## 2022-02-02 LAB — URINE CULTURE

## 2022-02-02 LAB — SURGICAL PCR SCREEN
MRSA, PCR: NEGATIVE
Staphylococcus aureus: POSITIVE — AB

## 2022-02-02 LAB — ABO/RH: ABO/RH(D): O NEG

## 2022-02-02 LAB — GLUCOSE, CAPILLARY
Glucose-Capillary: 132 mg/dL — ABNORMAL HIGH (ref 70–99)
Glucose-Capillary: 140 mg/dL — ABNORMAL HIGH (ref 70–99)
Glucose-Capillary: 171 mg/dL — ABNORMAL HIGH (ref 70–99)
Glucose-Capillary: 177 mg/dL — ABNORMAL HIGH (ref 70–99)
Glucose-Capillary: 222 mg/dL — ABNORMAL HIGH (ref 70–99)

## 2022-02-02 IMAGING — RF DG HIP (WITH OR WITHOUT PELVIS) 2-3V*R*
1 series · 4 of 4 positions shown · non-contrast
Comparison: [DATE]

CLINICAL DATA: Right hip arthroplasty

EXAM:
DG HIP (WITH OR WITHOUT PELVIS) 2-3V RIGHT

[Series 1: run · 4 of 4 slices shown]
[im 1/4]
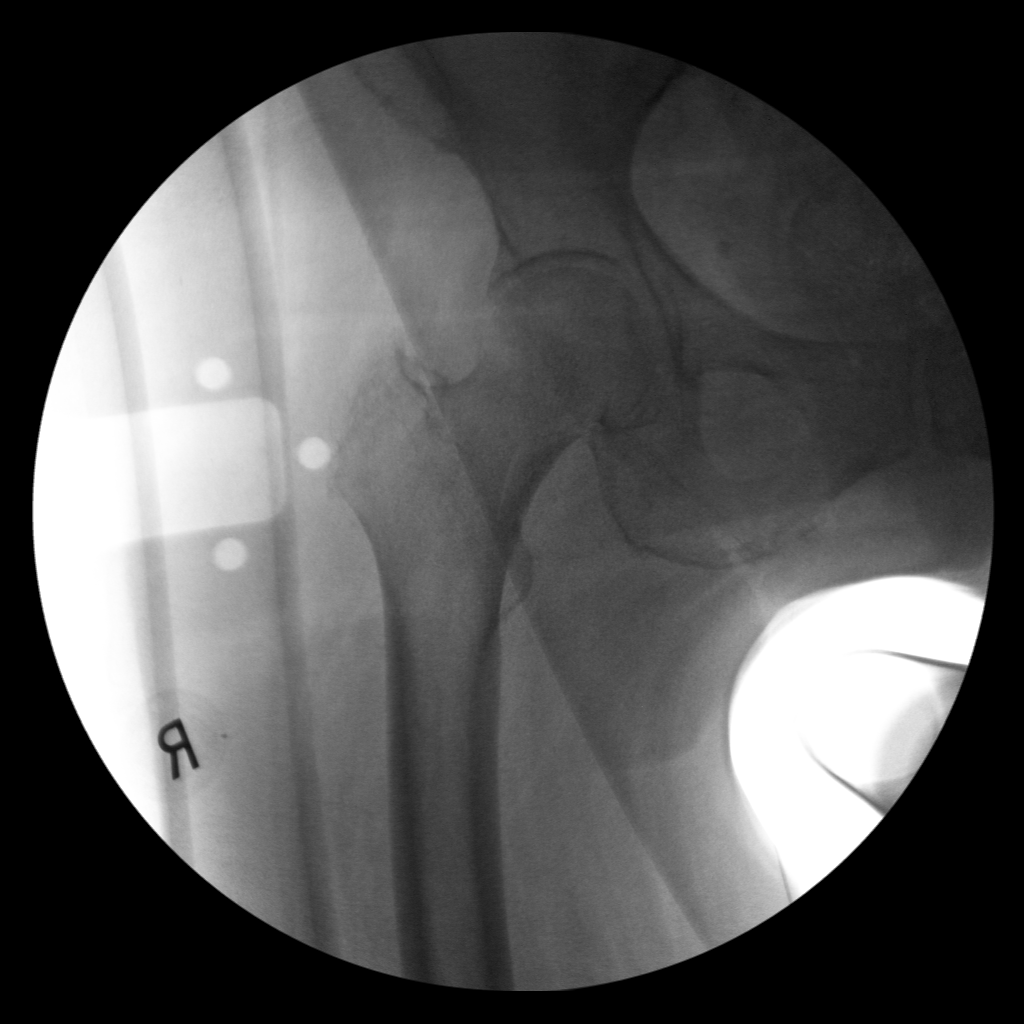
[im 2/4]
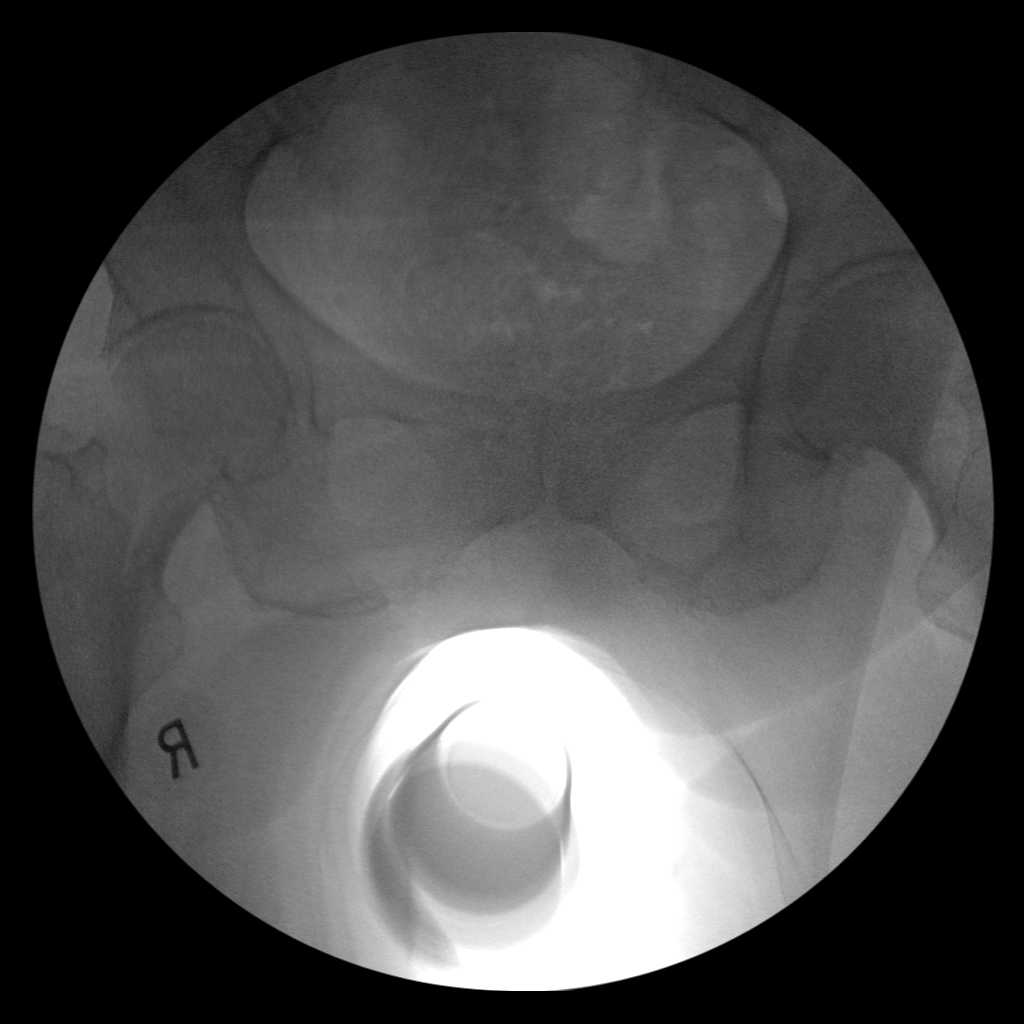
[im 3/4]
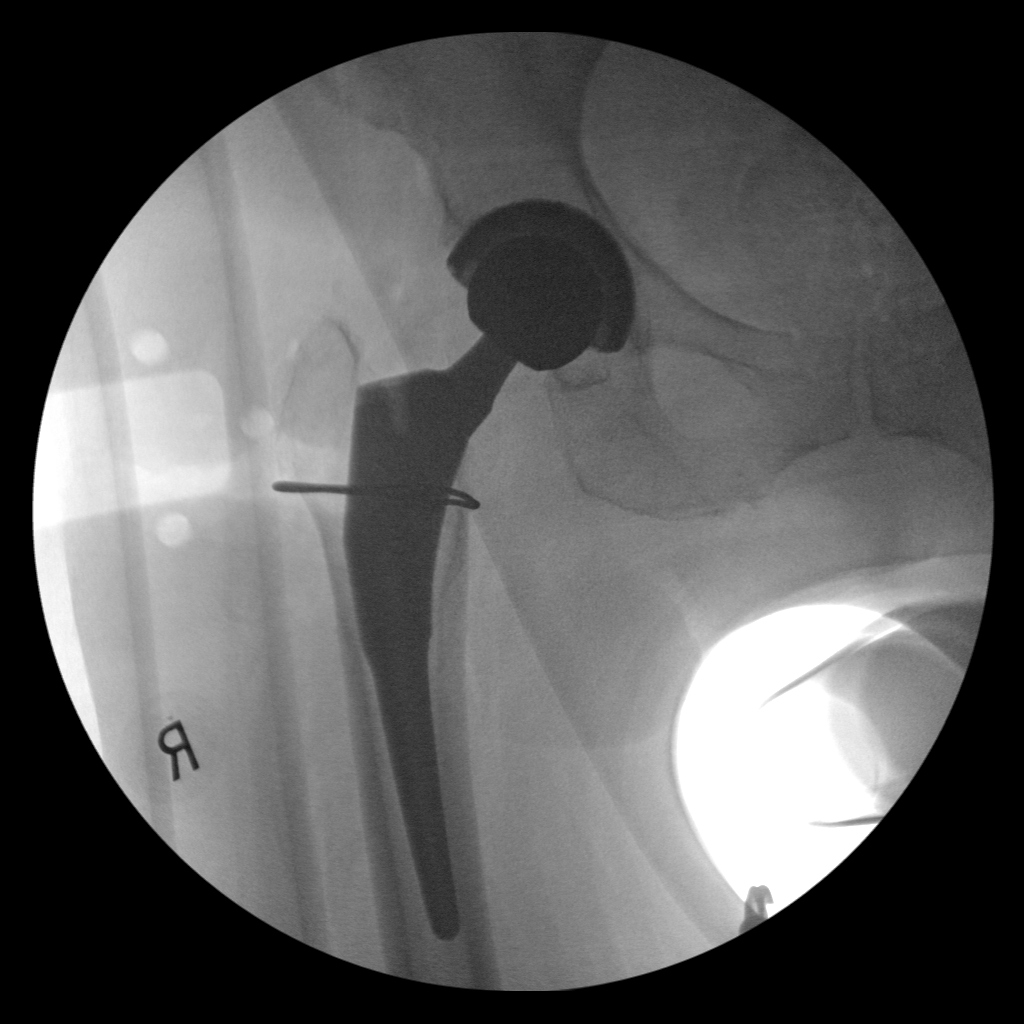
[im 4/4]
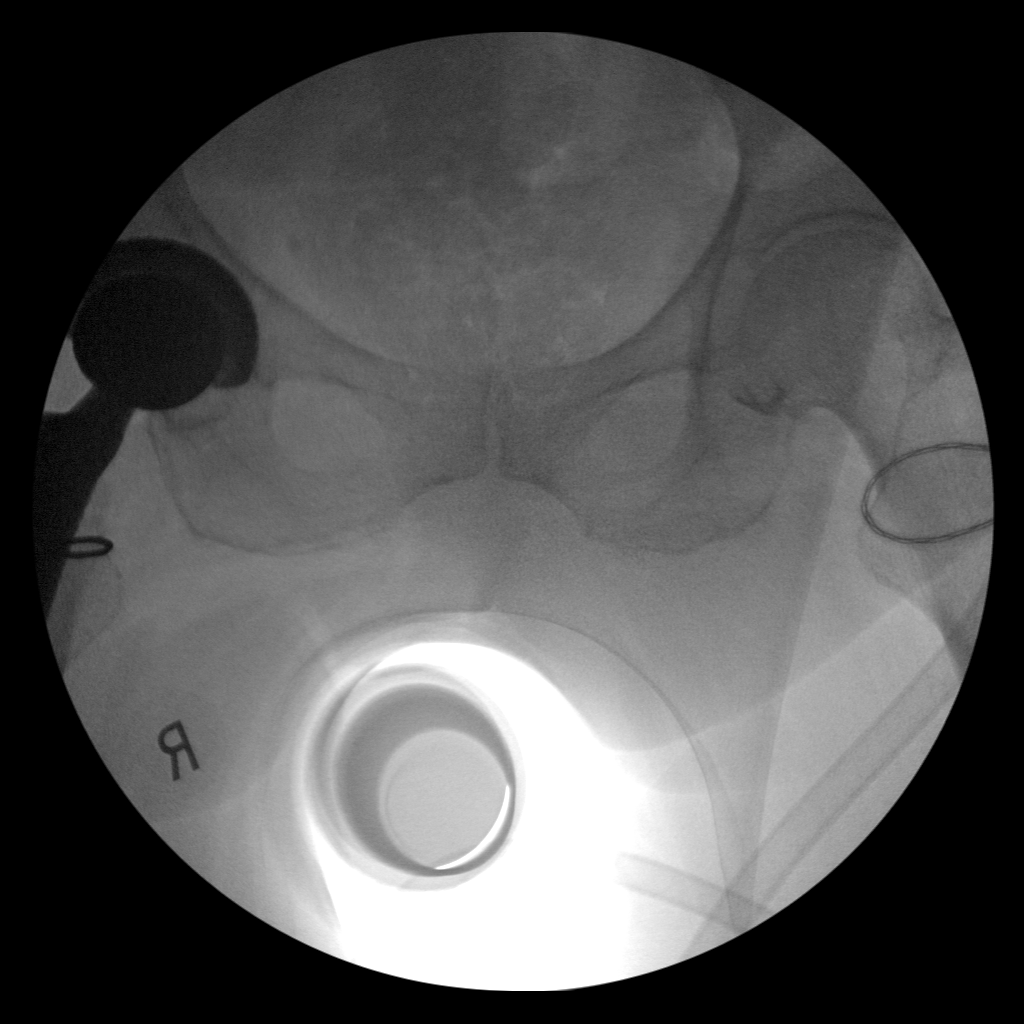

[4 of 4 positions shown; findings below may reference images not displayed]

FINDINGS: Three fluoroscopic images are obtained during the performance of the
procedure and are provided for interpretation only. Images
demonstrate right total hip arthroplasty, in near anatomic
alignment. No perihardware lucency or fracture.

Fluoroscopy time: 19 seconds

2.28 mGy
IMPRESSION: Right total hip arthroplasty.

## 2022-02-02 IMAGING — DX DG PORTABLE PELVIS
2 series · 2 of 2 positions shown · non-contrast
Comparison: None Available.

CLINICAL DATA: Post right hip replacement

EXAM:
PORTABLE PELVIS 1-2 VIEWS

[pelvis ap (1 of 2)]
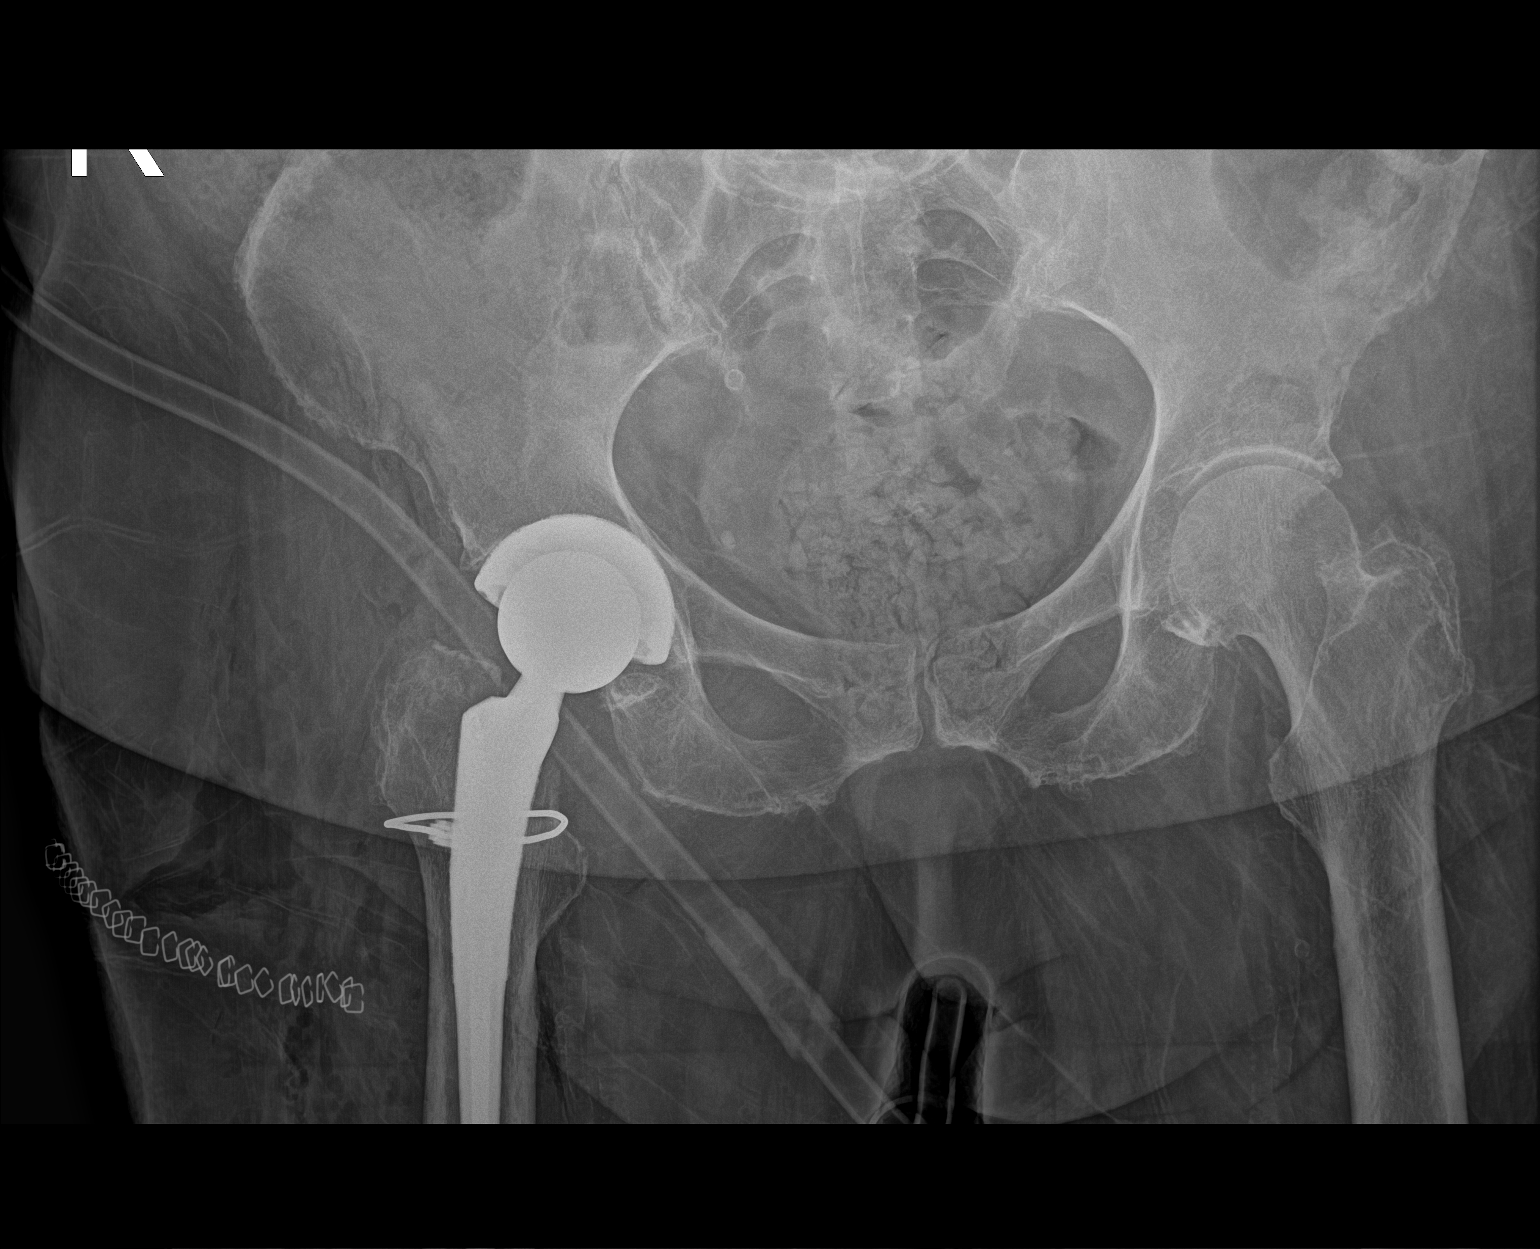

[pelvis ap (2 of 2)]
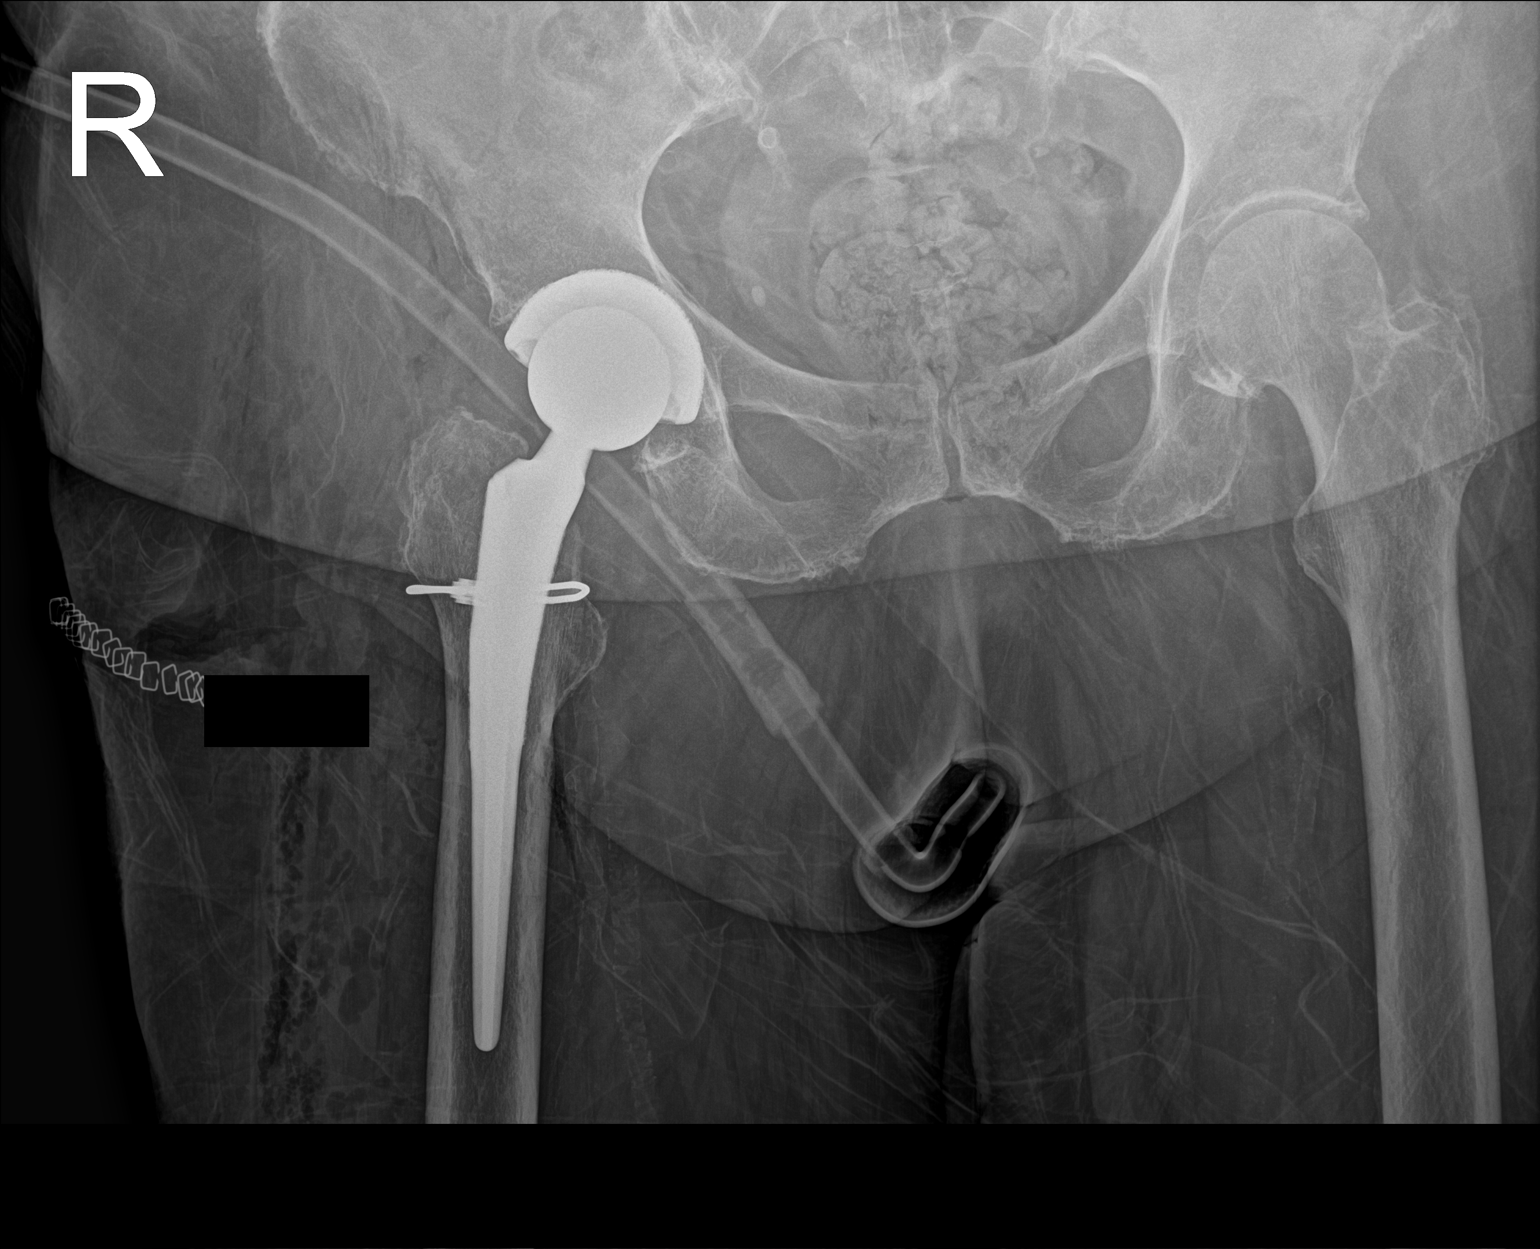

[2 of 2 positions shown; findings below may reference images not displayed]

FINDINGS: Post right hip total arthroplasty with single cerclage wire.
Postoperative changes in the soft tissues. No evidence of
complication.
IMPRESSION: Post right total hip arthroplasty for femoral neck fracture.

## 2022-02-02 SURGERY — ARTHROPLASTY, HIP, TOTAL, ANTERIOR APPROACH
Anesthesia: Spinal | Site: Hip | Laterality: Right

## 2022-02-02 MED ORDER — ACETAMINOPHEN 500 MG PO TABS
1000.0000 mg | ORAL_TABLET | Freq: Once | ORAL | Status: AC
  Administered 2022-02-02: 1000 mg via ORAL
  Filled 2022-02-02: qty 2

## 2022-02-02 MED ORDER — ONDANSETRON HCL 4 MG/2ML IJ SOLN: 4.0000 mg | Freq: Four times a day (QID) | INTRAMUSCULAR | Status: DC | PRN

## 2022-02-02 MED ORDER — 0.9 % SODIUM CHLORIDE (POUR BTL) OPTIME
TOPICAL | Status: DC | PRN
  Administered 2022-02-02: 1000 mL

## 2022-02-02 MED ORDER — FENTANYL CITRATE (PF) 250 MCG/5ML IJ SOLN
INTRAMUSCULAR | Status: DC | PRN
  Administered 2022-02-02: 50 ug via INTRAVENOUS
  Administered 2022-02-02: 100 ug via INTRAVENOUS
  Administered 2022-02-02 (×2): 50 ug via INTRAVENOUS
  Administered 2022-02-02: 100 ug via INTRAVENOUS
  Administered 2022-02-02: 50 ug via INTRAVENOUS

## 2022-02-02 MED ORDER — DEXAMETHASONE SODIUM PHOSPHATE 10 MG/ML IJ SOLN
INTRAMUSCULAR | Status: DC | PRN
  Administered 2022-02-02: 5 mg via INTRAVENOUS

## 2022-02-02 MED ORDER — FENTANYL CITRATE (PF) 250 MCG/5ML IJ SOLN
INTRAMUSCULAR | Status: AC
  Filled 2022-02-02: qty 5

## 2022-02-02 MED ORDER — CEFAZOLIN SODIUM-DEXTROSE 2-4 GM/100ML-% IV SOLN
2.0000 g | INTRAVENOUS | Status: AC
  Administered 2022-02-02: 2 g via INTRAVENOUS
  Filled 2022-02-02: qty 100

## 2022-02-02 MED ORDER — TRANEXAMIC ACID-NACL 1000-0.7 MG/100ML-% IV SOLN
1000.0000 mg | INTRAVENOUS | Status: AC
  Administered 2022-02-02: 1000 mg via INTRAVENOUS
  Filled 2022-02-02: qty 100

## 2022-02-02 MED ORDER — CHLORHEXIDINE GLUCONATE 0.12 % MT SOLN: 15.0000 mL | Freq: Once | OROMUCOSAL | Status: AC

## 2022-02-02 MED ORDER — CHLORHEXIDINE GLUCONATE 4 % EX LIQD
60.0000 mL | Freq: Once | CUTANEOUS | Status: AC
  Administered 2022-02-02: 4 via TOPICAL
  Filled 2022-02-02: qty 60

## 2022-02-02 MED ORDER — BUPIVACAINE-EPINEPHRINE 0.5% -1:200000 IJ SOLN
INTRAMUSCULAR | Status: AC
  Filled 2022-02-02: qty 1

## 2022-02-02 MED ORDER — LIDOCAINE 2% (20 MG/ML) 5 ML SYRINGE
INTRAMUSCULAR | Status: DC | PRN
  Administered 2022-02-02: 60 mg via INTRAVENOUS

## 2022-02-02 MED ORDER — CHLORHEXIDINE GLUCONATE 0.12 % MT SOLN
OROMUCOSAL | Status: AC
  Administered 2022-02-02: 15 mL via OROMUCOSAL
  Filled 2022-02-02: qty 15

## 2022-02-02 MED ORDER — CEFAZOLIN SODIUM-DEXTROSE 2-4 GM/100ML-% IV SOLN
2.0000 g | Freq: Four times a day (QID) | INTRAVENOUS | Status: AC
  Administered 2022-02-02 (×2): 2 g via INTRAVENOUS
  Filled 2022-02-02 (×2): qty 100

## 2022-02-02 MED ORDER — METOCLOPRAMIDE HCL 5 MG/ML IJ SOLN: 5.0000 mg | Freq: Three times a day (TID) | INTRAMUSCULAR | Status: DC | PRN

## 2022-02-02 MED ORDER — SUGAMMADEX SODIUM 200 MG/2ML IV SOLN
INTRAVENOUS | Status: DC | PRN
  Administered 2022-02-02: 200 mg via INTRAVENOUS

## 2022-02-02 MED ORDER — PHENYLEPHRINE HCL-NACL 20-0.9 MG/250ML-% IV SOLN
INTRAVENOUS | Status: DC | PRN
  Administered 2022-02-02: 50 ug/min via INTRAVENOUS

## 2022-02-02 MED ORDER — ALBUMIN HUMAN 5 % IV SOLN: INTRAVENOUS | Status: DC | PRN

## 2022-02-02 MED ORDER — METOCLOPRAMIDE HCL 5 MG PO TABS: 5.0000 mg | ORAL_TABLET | Freq: Three times a day (TID) | ORAL | Status: DC | PRN

## 2022-02-02 MED ORDER — CHLORHEXIDINE GLUCONATE CLOTH 2 % EX PADS
6.0000 | MEDICATED_PAD | Freq: Every day | CUTANEOUS | Status: AC
  Administered 2022-02-03 – 2022-02-07 (×5): 6 via TOPICAL

## 2022-02-02 MED ORDER — ENSURE ENLIVE PO LIQD
237.0000 mL | Freq: Two times a day (BID) | ORAL | Status: DC
  Administered 2022-02-02 – 2022-02-06 (×9): 237 mL via ORAL
  Filled 2022-02-02: qty 237

## 2022-02-02 MED ORDER — ONDANSETRON HCL 4 MG/2ML IJ SOLN
INTRAMUSCULAR | Status: DC | PRN
  Administered 2022-02-02: 4 mg via INTRAVENOUS

## 2022-02-02 MED ORDER — ACETAMINOPHEN 10 MG/ML IV SOLN: 1000.0000 mg | Freq: Once | INTRAVENOUS | Status: DC | PRN

## 2022-02-02 MED ORDER — KETOROLAC TROMETHAMINE 30 MG/ML IJ SOLN
INTRAMUSCULAR | Status: AC
  Filled 2022-02-02: qty 1

## 2022-02-02 MED ORDER — POVIDONE-IODINE 10 % EX SWAB: 2.0000 "application " | Freq: Once | CUTANEOUS | Status: DC

## 2022-02-02 MED ORDER — PHENOL 1.4 % MT LIQD
1.0000 | OROMUCOSAL | Status: DC | PRN
  Administered 2022-02-04: 1 via OROMUCOSAL
  Filled 2022-02-02: qty 177

## 2022-02-02 MED ORDER — PROPOFOL 10 MG/ML IV BOLUS
INTRAVENOUS | Status: AC
  Filled 2022-02-02: qty 20

## 2022-02-02 MED ORDER — ORAL CARE MOUTH RINSE: 15.0000 mL | Freq: Once | OROMUCOSAL | Status: AC

## 2022-02-02 MED ORDER — IRRISEPT - 450ML BOTTLE WITH 0.05% CHG IN STERILE WATER, USP 99.95% OPTIME
TOPICAL | Status: DC | PRN
  Administered 2022-02-02: 450 mL

## 2022-02-02 MED ORDER — DEXMEDETOMIDINE (PRECEDEX) IN NS 20 MCG/5ML (4 MCG/ML) IV SYRINGE
PREFILLED_SYRINGE | INTRAVENOUS | Status: DC | PRN
  Administered 2022-02-02: 8 ug via INTRAVENOUS

## 2022-02-02 MED ORDER — ONDANSETRON HCL 4 MG PO TABS: 4.0000 mg | ORAL_TABLET | Freq: Four times a day (QID) | ORAL | Status: DC | PRN

## 2022-02-02 MED ORDER — METHOCARBAMOL 500 MG PO TABS: 500.0000 mg | ORAL_TABLET | Freq: Four times a day (QID) | ORAL | Status: DC | PRN

## 2022-02-02 MED ORDER — PROPOFOL 10 MG/ML IV BOLUS
INTRAVENOUS | Status: DC | PRN
  Administered 2022-02-02: 120 mg via INTRAVENOUS

## 2022-02-02 MED ORDER — METHOCARBAMOL 1000 MG/10ML IJ SOLN: 500.0000 mg | Freq: Four times a day (QID) | INTRAVENOUS | Status: DC | PRN

## 2022-02-02 MED ORDER — LACTATED RINGERS IV SOLN: INTRAVENOUS | Status: DC

## 2022-02-02 MED ORDER — ROCURONIUM BROMIDE 10 MG/ML (PF) SYRINGE
PREFILLED_SYRINGE | INTRAVENOUS | Status: DC | PRN
  Administered 2022-02-02: 100 mg via INTRAVENOUS

## 2022-02-02 MED ORDER — FLUCONAZOLE 150 MG PO TABS
150.0000 mg | ORAL_TABLET | Freq: Every day | ORAL | Status: AC
  Administered 2022-02-02 – 2022-02-04 (×3): 150 mg via ORAL
  Filled 2022-02-02 (×3): qty 1

## 2022-02-02 MED ORDER — SODIUM CHLORIDE (PF) 0.9 % IJ SOLN
INTRAMUSCULAR | Status: AC
  Filled 2022-02-02: qty 50

## 2022-02-02 MED ORDER — MUPIROCIN 2 % EX OINT
1.0000 "application " | TOPICAL_OINTMENT | Freq: Two times a day (BID) | CUTANEOUS | Status: AC
  Administered 2022-02-02 – 2022-02-06 (×8): 1 via NASAL
  Filled 2022-02-02 (×4): qty 22

## 2022-02-02 MED ORDER — CLOPIDOGREL BISULFATE 75 MG PO TABS
75.0000 mg | ORAL_TABLET | Freq: Every day | ORAL | Status: DC
  Administered 2022-02-03 – 2022-02-09 (×7): 75 mg via ORAL
  Filled 2022-02-02 (×9): qty 1

## 2022-02-02 MED ORDER — INSULIN ASPART 100 UNIT/ML IJ SOLN: 0.0000 [IU] | INTRAMUSCULAR | Status: DC | PRN

## 2022-02-02 MED ORDER — SODIUM CHLORIDE 0.9 % IR SOLN
Status: DC | PRN
  Administered 2022-02-02: 1000 mL
  Administered 2022-02-02: 3000 mL

## 2022-02-02 MED ORDER — TRANEXAMIC ACID-NACL 1000-0.7 MG/100ML-% IV SOLN
1000.0000 mg | Freq: Once | INTRAVENOUS | Status: AC
  Administered 2022-02-02: 1000 mg via INTRAVENOUS
  Filled 2022-02-02: qty 100

## 2022-02-02 MED ORDER — NYSTATIN 100000 UNIT/GM EX POWD
Freq: Two times a day (BID) | CUTANEOUS | Status: DC
Start: 2022-02-02 — End: 2022-02-09
  Filled 2022-02-02: qty 15

## 2022-02-02 MED ORDER — MENTHOL 3 MG MT LOZG: 1.0000 | LOZENGE | OROMUCOSAL | Status: DC | PRN

## 2022-02-02 MED ORDER — POVIDONE-IODINE 10 % EX SWAB
2.0000 "application " | Freq: Once | CUTANEOUS | Status: AC
  Administered 2022-02-02: 2 via TOPICAL

## 2022-02-02 MED ORDER — FENTANYL CITRATE (PF) 100 MCG/2ML IJ SOLN: 25.0000 ug | INTRAMUSCULAR | Status: DC | PRN

## 2022-02-02 MED ORDER — DOCUSATE SODIUM 100 MG PO CAPS
100.0000 mg | ORAL_CAPSULE | Freq: Two times a day (BID) | ORAL | Status: DC
  Administered 2022-02-02 – 2022-02-09 (×13): 100 mg via ORAL
  Filled 2022-02-02 (×14): qty 1

## 2022-02-02 MED ORDER — SODIUM CHLORIDE (PF) 0.9 % IJ SOLN
INTRAMUSCULAR | Status: DC | PRN
  Administered 2022-02-02: 61 mL

## 2022-02-02 MED ORDER — EPHEDRINE SULFATE-NACL 50-0.9 MG/10ML-% IV SOSY
PREFILLED_SYRINGE | INTRAVENOUS | Status: DC | PRN
  Administered 2022-02-02 (×2): 5 mg via INTRAVENOUS
  Administered 2022-02-02: 10 mg via INTRAVENOUS

## 2022-02-02 MED ORDER — PHENYLEPHRINE 80 MCG/ML (10ML) SYRINGE FOR IV PUSH (FOR BLOOD PRESSURE SUPPORT)
PREFILLED_SYRINGE | INTRAVENOUS | Status: DC | PRN
  Administered 2022-02-02: 80 ug via INTRAVENOUS
  Administered 2022-02-02 (×2): 160 ug via INTRAVENOUS
  Administered 2022-02-02 (×2): 80 ug via INTRAVENOUS
  Administered 2022-02-02: 160 ug via INTRAVENOUS
  Administered 2022-02-02: 80 ug via INTRAVENOUS

## 2022-02-02 SURGICAL SUPPLY — 69 items
ACE SHELL 3H 52 E HIP (Shell) ×2 IMPLANT
ADH SKN CLS APL DERMABOND .7 (GAUZE/BANDAGES/DRESSINGS) ×1
ALCOHOL 70% 16 OZ (MISCELLANEOUS) ×2 IMPLANT
BAG COUNTER SPONGE SURGICOUNT (BAG) ×2 IMPLANT
BLADE CLIPPER SURG (BLADE) ×1 IMPLANT
CABLE READY CERCLAGE W/CRIP (Trauma Fixation) ×1 IMPLANT
CHLORAPREP W/TINT 26 (MISCELLANEOUS) ×2 IMPLANT
COVER SURGICAL LIGHT HANDLE (MISCELLANEOUS) ×2 IMPLANT
DERMABOND ADVANCED (GAUZE/BANDAGES/DRESSINGS) ×1
DERMABOND ADVANCED .7 DNX12 (GAUZE/BANDAGES/DRESSINGS) ×2 IMPLANT
DRAPE C-ARM 42X72 X-RAY (DRAPES) ×2 IMPLANT
DRAPE STERI IOBAN 125X83 (DRAPES) ×2 IMPLANT
DRAPE U-SHAPE 47X51 STRL (DRAPES) ×6 IMPLANT
DRSG AQUACEL AG ADV 3.5X10 (GAUZE/BANDAGES/DRESSINGS) ×2 IMPLANT
ELECT BLADE 4.0 EZ CLEAN MEGAD (MISCELLANEOUS) ×2
ELECT PENCIL ROCKER SW 15FT (MISCELLANEOUS) ×2 IMPLANT
ELECT REM PT RETURN 9FT ADLT (ELECTROSURGICAL) ×2
ELECTRODE BLDE 4.0 EZ CLN MEGD (MISCELLANEOUS) ×1 IMPLANT
ELECTRODE REM PT RTRN 9FT ADLT (ELECTROSURGICAL) ×1 IMPLANT
EVACUATOR 1/8 PVC DRAIN (DRAIN) IMPLANT
GLOVE BIO SURGEON STRL SZ8.5 (GLOVE) ×4 IMPLANT
GLOVE BIOGEL M 7.0 STRL (GLOVE) ×2 IMPLANT
GLOVE BIOGEL PI IND STRL 7.5 (GLOVE) ×1 IMPLANT
GLOVE BIOGEL PI IND STRL 8.5 (GLOVE) ×1 IMPLANT
GLOVE BIOGEL PI INDICATOR 7.5 (GLOVE) ×1
GLOVE BIOGEL PI INDICATOR 8.5 (GLOVE) ×1
GOWN STRL REUS W/ TWL LRG LVL3 (GOWN DISPOSABLE) ×2 IMPLANT
GOWN STRL REUS W/ TWL XL LVL3 (GOWN DISPOSABLE) ×1 IMPLANT
GOWN STRL REUS W/TWL 2XL LVL3 (GOWN DISPOSABLE) ×2 IMPLANT
GOWN STRL REUS W/TWL LRG LVL3 (GOWN DISPOSABLE) ×4
GOWN STRL REUS W/TWL XL LVL3 (GOWN DISPOSABLE) ×2
HANDPIECE INTERPULSE COAX TIP (DISPOSABLE) ×2
HEAD CERAMIC BIOLOX 36 T1 STD (Head) ×1 IMPLANT
HOOD PEEL AWAY FACE SHEILD DIS (HOOD) ×4 IMPLANT
JET LAVAGE IRRISEPT WOUND (IRRIGATION / IRRIGATOR) ×2
KIT BASIN OR (CUSTOM PROCEDURE TRAY) ×2 IMPLANT
KIT TURNOVER KIT B (KITS) ×2 IMPLANT
LAVAGE JET IRRISEPT WOUND (IRRIGATION / IRRIGATOR) ×1 IMPLANT
LINER ACE G7 HIGH 36 SZ E (Liner) ×1 IMPLANT
MANIFOLD NEPTUNE II (INSTRUMENTS) ×2 IMPLANT
MARKER SKIN DUAL TIP RULER LAB (MISCELLANEOUS) ×4 IMPLANT
NDL SPNL 18GX3.5 QUINCKE PK (NEEDLE) ×1 IMPLANT
NEEDLE SPNL 18GX3.5 QUINCKE PK (NEEDLE) ×2 IMPLANT
NS IRRIG 1000ML POUR BTL (IV SOLUTION) ×2 IMPLANT
PACK TOTAL JOINT (CUSTOM PROCEDURE TRAY) ×2 IMPLANT
PACK UNIVERSAL I (CUSTOM PROCEDURE TRAY) ×2 IMPLANT
PAD ARMBOARD 7.5X6 YLW CONV (MISCELLANEOUS) ×4 IMPLANT
SAW OSC TIP CART 19.5X105X1.3 (SAW) ×2 IMPLANT
SEALER BIPOLAR AQUA 6.0 (INSTRUMENTS) ×1 IMPLANT
SET HNDPC FAN SPRY TIP SCT (DISPOSABLE) ×1 IMPLANT
SHELL ACETAB 3H 52 E HIP (Shell) IMPLANT
SOL PREP POV-IOD 4OZ 10% (MISCELLANEOUS) ×2 IMPLANT
STAPLER VISISTAT 35W (STAPLE) ×1 IMPLANT
STEM FEM CMTLS HIP 14X148 123D (Stem) ×1 IMPLANT
SUT ETHIBOND NAB CT1 #1 30IN (SUTURE) ×4 IMPLANT
SUT MNCRL AB 3-0 PS2 18 (SUTURE) ×2 IMPLANT
SUT MON AB 2-0 CT1 36 (SUTURE) ×2 IMPLANT
SUT VIC AB 1 CT1 27 (SUTURE) ×2
SUT VIC AB 1 CT1 27XBRD ANBCTR (SUTURE) ×1 IMPLANT
SUT VIC AB 2-0 CT1 27 (SUTURE) ×2
SUT VIC AB 2-0 CT1 TAPERPNT 27 (SUTURE) ×1 IMPLANT
SUT VLOC 180 0 24IN GS25 (SUTURE) ×2 IMPLANT
SYR 50ML LL SCALE MARK (SYRINGE) ×2 IMPLANT
TOWEL GREEN STERILE (TOWEL DISPOSABLE) ×2 IMPLANT
TOWEL GREEN STERILE FF (TOWEL DISPOSABLE) ×2 IMPLANT
TRAY CATH 16FR W/PLASTIC CATH (SET/KITS/TRAYS/PACK) IMPLANT
TRAY FOLEY W/BAG SLVR 16FR (SET/KITS/TRAYS/PACK)
TRAY FOLEY W/BAG SLVR 16FR ST (SET/KITS/TRAYS/PACK) IMPLANT
WATER STERILE IRR 1000ML POUR (IV SOLUTION) ×6 IMPLANT

## 2022-02-02 NOTE — Discharge Instructions (Signed)
? ?Dr. Neema Barreira ?Joint Replacement Specialist ?[facility] ?[address] ?[place], [place] [address] ?[phone] ? ? ?TOTAL HIP REPLACEMENT POSTOPERATIVE DIRECTIONS ? ? ? ?Hip Rehabilitation, Guidelines Following Surgery  ? ?WEIGHT BEARING ?Weight bearing as tolerated with assist device (walker, cane, etc) as directed, use it as long as suggested by your surgeon or therapist, typically at least 4-6 weeks. ? ?The results of a hip operation are greatly improved after range of motion and muscle strengthening exercises. Follow all safety measures which are given to protect your hip. If any of these exercises cause increased pain or swelling in your joint, decrease the amount until you are comfortable again. Then slowly increase the exercises. Call your caregiver if you have problems or questions.  ? ?HOME CARE INSTRUCTIONS  ?Most of the following instructions are designed to prevent the dislocation of your new hip.  ?Remove items at home which could result in a fall. This includes throw rugs or furniture in walking pathways.  ?Continue medications as instructed at time of discharge. ?You may have some home medications which will be placed on hold until you complete the course of blood thinner medication. ?You may start showering once you are discharged home. Do not remove your dressing. ?Do not put on socks or shoes without following the instructions of your caregivers.   ?Sit on chairs with arms. Use the chair arms to help push yourself up when arising.  ?Arrange for the use of a toilet seat elevator so you are not sitting low.  ?Walk with walker as instructed.  ?You may resume a sexual relationship in one month or when given the OK by your caregiver.  ?Use walker as long as suggested by your caregivers.  ?You may put full weight on your legs and walk as much as is comfortable. ?Avoid periods of inactivity such as sitting longer than an hour when not asleep. This helps prevent blood  clots.  ?You may return to work once you are cleared by your surgeon.  ?Do not drive a car for 6 weeks or until released by your surgeon.  ?Do not drive while taking narcotics.  ?Wear elastic stockings for two weeks following surgery during the day but you may remove then at night.  ?Make sure you keep all of your appointments after your operation with all of your doctors and caregivers. You should call the office at the above phone number and make an appointment for approximately two weeks after the date of your surgery. ?Please pick up a stool softener and laxative for home use as long as you are requiring pain medications. ?ICE to the affected hip every three hours for 30 minutes at a time and then as needed for pain and swelling. Continue to use ice on the hip for pain and swelling from surgery. You may notice swelling that will progress down to the foot and ankle.  This is normal after surgery.  Elevate the leg when you are not up walking on it.   ?It is important for you to complete the blood thinner medication as prescribed by your doctor. ?Continue to use the breathing machine which will help keep your temperature down.  It is common for your temperature to cycle up and down following surgery, especially at night when you are not up moving around and exerting yourself.  The breathing machine keeps your lungs expanded and your temperature down. ? ?RANGE OF MOTION AND STRENGTHENING EXERCISES  ?These exercises are designed to help you   keep full movement of your hip joint. Follow your caregiver's or physical therapist's instructions. Perform all exercises about fifteen times, three times per day or as directed. Exercise both hips, even if you have had only one joint replacement. These exercises can be done on a training (exercise) mat, on the floor, on a table or on a bed. Use whatever works the best and is most comfortable for you. Use music or television while you are exercising so that the exercises are a  pleasant break in your day. This will make your life better with the exercises acting as a break in routine you can look forward to.  ?Lying on your back, slowly slide your foot toward your buttocks, raising your knee up off the floor. Then slowly slide your foot back down until your leg is straight again.  ?Lying on your back spread your legs as far apart as you can without causing discomfort.  ?Lying on your side, raise your upper leg and foot straight up from the floor as far as is comfortable. Slowly lower the leg and repeat.  ?Lying on your back, tighten up the muscle in the front of your thigh (quadriceps muscles). You can do this by keeping your leg straight and trying to raise your heel off the floor. This helps strengthen the largest muscle supporting your knee.  ?Lying on your back, tighten up the muscles of your buttocks both with the legs straight and with the knee bent at a comfortable angle while keeping your heel on the floor.  ? ?SKILLED REHAB INSTRUCTIONS: ?If the patient is transferred to a skilled rehab facility following release from the hospital, a list of the current medications will be sent to the facility for the patient to continue.  When discharged from the skilled rehab facility, please have the facility set up the patient's Home Health Physical Therapy prior to being released. Also, the skilled facility will be responsible for providing the patient with their medications at time of release from the facility to include their pain medication and their blood thinner medication. If the patient is still at the rehab facility at time of the two week follow up appointment, the skilled rehab facility will also need to assist the patient in arranging follow up appointment in our office and any transportation needs. ? ?POST-OPERATIVE OPIOID TAPER INSTRUCTIONS: ?It is important to wean off of your opioid medication as soon as possible. If you do not need pain medication after your surgery it is ok  to stop day one. ?Opioids include: ?Codeine, Hydrocodone(Norco, Vicodin), Oxycodone(Percocet, oxycontin) and hydromorphone amongst others.  ?Long term and even short term use of opiods can cause: ?Increased pain response ?Dependence ?Constipation ?Depression ?Respiratory depression ?And more.  ?Withdrawal symptoms can include ?Flu like symptoms ?Nausea, vomiting ?And more ?Techniques to manage these symptoms ?Hydrate well ?Eat regular healthy meals ?Stay active ?Use relaxation techniques(deep breathing, meditating, yoga) ?Do Not substitute Alcohol to help with tapering ?If you have been on opioids for less than two weeks and do not have pain than it is ok to stop all together.  ?Plan to wean off of opioids ?This plan should start within one week post op of your joint replacement. ?Maintain the same interval or time between taking each dose and first decrease the dose.  ?Cut the total daily intake of opioids by one tablet each day ?Next start to increase the time between doses. ?The last dose that should be eliminated is the evening dose.  ? ? ?MAKE   SURE YOU:  ?Understand these instructions.  ?Will watch your condition.  ?Will get help right away if you are not doing well or get worse. ? ?Pick up stool softner and laxative for home use following surgery while on pain medications. ?Do not remove your dressing. ?The dressing is waterproof--it is OK to take showers. ?Continue to use ice for pain and swelling after surgery. ?Do not use any lotions or creams on the incision until instructed by your surgeon. ?Total Hip Protocol. ? ?

## 2022-02-02 NOTE — Progress Notes (Signed)
Initial Nutrition Assessment  DOCUMENTATION CODES:   Obesity unspecified  INTERVENTION:  Once diet advance, encourage adequate PO intake Ensure Enlive po BID, each supplement provides 350 kcal and 20 grams of protein.  NUTRITION DIAGNOSIS:   Increased nutrient needs related to hip fracture, post-op healing as evidenced by estimated needs.  GOAL:   Patient will meet greater than or equal to 90% of their needs  MONITOR:   PO intake, Supplement acceptance, Diet advancement, Labs, Skin, Weight trends  REASON FOR ASSESSMENT:   Consult Hip fracture protocol  ASSESSMENT:   Pt admitted from home d/t fall leading to R hip fracture. PMH significant for anxiety, HTN, DM, and CVA. 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   Per Ortho, plans for THA today.   Unable to obtain detailed hx from pt as she is observed to be in significant pain. Will obtain more detailed nutrition hx after surgery at follow up. Pt is NPO for THA today. Placing nutrition recommendations and will adjust at follow up.   Per review of wt hx, pt's wt appears to remain stable between 102.1-103.2 kg within the last 2 months. Will continue to monitor throughout admission.   Edema: generalized  Medications: colace, fenofibrate, SSI 0-15 units TID, SSI 0-5 units QHS, melatonin, NaCl tablet TID, LR @ 92ml/hr  Labs: HgbA1c (04/14) 6.3%, CBG's 124-149 x24 hours  NUTRITION - FOCUSED PHYSICAL EXAM: Deferred to follow up as pt in significant pain.   Diet Order:   Diet Order             Diet heart healthy/carb modified Room service appropriate? Yes; Fluid consistency: Thin  Diet effective now                   EDUCATION NEEDS:   No education needs have been identified at this time  Skin:  Skin Assessment: Reviewed RN Assessment  Last BM:  6/12  Height:   Ht Readings from Last 1 Encounters:  02/01/22 5\' 5"  (1.651 m)    Weight:   Wt Readings from Last 1  Encounters:  02/01/22 102.1 kg    Ideal Body Weight:  56.8 kg  BMI:  Body mass index is 37.44 kg/m.  Estimated Nutritional Needs:   Kcal:  1700-1900  Protein:  85-100g  Fluid:  >/=1.7L  04/03/22, RDN, LDN Clinical Nutrition

## 2022-02-02 NOTE — Hospital Course (Addendum)
Rebecca Barnes is a 76 y.o. female with medical history significant of anxiety; hypertension, diabetes mellitus, CVA presented to hospital after sustaining a fall.  Of note, patient was recently admitted from 4/12-16 with an acute L MCA CVA and was discharged to [facility]; she remained in CIR from 4/16-23 and at the time of discharge required supervision for transfers due to R visual field deficit, aphasia, and apraxia.  With her aphasia, she is really not able to effectively provide history, mostly sounded like word salad.  Patient has been having difficulty communicating.  Has caregiver at home.  In the ED, patient was noted to have right femoral neck fracture. CT of the head was negative.  Orthopedics was consulted and underwent right total hip arthroplasty on 02/02/2022 and subsequently had increasing anxiety, restlessness confusion and low-grade fever.  Chest x-ray showed some atelectasis.  Currently awaiting for CIR/skilled nursing facility placement.   During hospitalization, patient had episodes of agitation confusion and anxiety during hospitalization.  Needed one-to-one sitter initially.  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    Physical therapy on board , CIR versus skilled nursing facility placement at this time.  Continue aspirin Plavix, weightbearing as tolerated  Significant leukocytosis/mild fever,  acute metabolic encephalopathy.  Improved at this time.  Has tele-sitter with intermittent  agitation.  Discontinue TeleSitter.  Latest WBC at 8.6 improving from 12.4< 18.2<18.7 < 23.7.  Chest x-ray with possible atelectasis.  Unable to use incentive spirometry.  COVID and influenza was negative.  Continue aspiration precautions.  Sedative-hypnotics, narcotic and muscle relaxants doses have been adjusted.  Procalcitonin at 0.3.    Recent CVA with persistent aphasia, agitation, anxiety, mood disorder with behavioral issues Continue aspirin and Plavix.  Plan is for skilled nursing facility placement or CIR at this time.  Psychiatry has been consulted for medication management and assessment at this time as per family request.  Possibility of poststroke behavioral change/cognitive decline. Klonopin has been changed to nighttime only.  Continue Atarax and Flexeril.  Urinary retention.  Required in and out cath.  We will continue to monitor closely.  No further episodes of urinary retention.  Essential HTN -Continue Norvasc, Losartan on hold.   Hyperlipidemia  Intolerant to statins.  On fenofibrate.  Diabetes mellitus type 2 Recent hemoglobin A1c was 6.3, continue sliding scale insulin.  Hold metformin for now.  Latest POC glucose of 159.  Acute Kidney Injury.  Present on admission.  Has improved at this time.  Latest creatinine of 0.5.  obesity -Body mass index is 37.44 kg/m. Would benefit from weight loss as outpatient.

## 2022-02-02 NOTE — Progress Notes (Signed)
PROGRESS NOTE    Rebecca Barnes  [id number] DOB: 05-08-1946 DOA: 02/01/2022 PCP: Sandrea Hughs, NP    Brief Narrative:  Rebecca Barnes is a 76 y.o. female with medical history significant of anxiety; hypertension, diabetes mellitus, CVA presented to hospital after sustaining a fall.  Of note patient was recently admitted from 4/12-16 with an acute L MCA CVA and was discharged to [facility]; she remained in CIR from 4/16-23 and at the time of discharge required supervision for transfers due to R visual field deficit, aphasia, and apraxia.  With her aphasia, she is really not able to effectively provide history, mostly sounds like word salad.  Patient has been having difficulty communicating.  Has caregiver at home.  In the ED patient was noted to have right femoral neck fracture.  He of the head was negative.  Orthopedics was consulted and plan for surgical intervention 02/02/2022   Assessment and plan  Principal Problem:   Hip fracture ([place]) Active Problems:   Anxiety with depression   Type 2 diabetes mellitus without complication, without long-term current use of insulin (HCC)   Essential hypertension   History of CVA (cerebrovascular accident)   Obesity (BMI 30-39.9)   Aphasia   Dyslipidemia   DNR (do not resuscitate)   R femoral neck fracture Status post mechanical fall with right hip fracture.  Continue supportive care including muscle relaxant analgesics.  Patient is currently n.p.o. for potential surgical intervention today.  -Fascia iliacus block ordered per anesthesia   Pre-operative stratification Was performed and awaiting for surgical intervention today.   Recent CVA with persistent aphasia On aspirin and Plavix as outpatient which is on hold for surgical intervention.  Likely need CIR after surgical intervention.  Patient's family is having a difficult time at home trying to take care of her needs as well.    Mood disorder -Continue fluoxetine hydroxyzine Klonopin.   Patient has been having episodes of anxiety depression and frustration since the CVA.  Essential HTN -Continue Norvasc, losartan pressure slightly elevated but patient is anxious and agitated.   Hyperlipidemia  Intolerant to statins.  On fenofibrate.  DM 2. Recent hemoglobin A1c was 6.3, continue sliding scale insulin.  Hold metformin for now   obesity -Body mass index is 37.44 kg/m.Marland Kitchen  Would benefit from weight loss as outpatient.   DNR       DVT prophylaxis: SCDs Start: 02/01/22 0952   Code Status:     Code Status: DNR  Disposition: Possible need for CIR.  Status is: Inpatient Remains inpatient appropriate because: Need for surgical intervention and possible placement.   Family Communication:  None at present  Consultants:  Orthopedics  Procedures:  None so far  Antimicrobials:  Preoperative cefazolin  Anti-infectives (From admission, onward)    Start     Dose/Rate Route Frequency Ordered Stop   02/02/22 1100  ceFAZolin (ANCEF) IVPB 2g/100 mL premix        2 g 200 mL/hr over 30 Minutes Intravenous On call to O.R. 02/02/22 0419 02/03/22 0559      Subjective: Today, patient was seen and examined at bedside.  Patient appears to be very anxious agitated at times and wishes to go home.  Bilateral mittens in place.  Objective: Vitals:   02/01/22 1730 02/01/22 1902 02/02/22 0500 02/02/22 0757  BP: (!) 149/66 (!) 189/82 (!) 142/95 (!) 170/78  Pulse: 86 (!) 102 99 (!) 101  Resp: 14 19 18 18   Temp:  98.6 F (37 C) 98.3 F (36.8 C)  98.8 F (37.1 C)  TempSrc:  Oral Oral   SpO2: 99% 94% 96%   Weight:      Height:        Intake/Output Summary (Last 24 hours) at 02/02/2022 0845 Last data filed at 02/02/2022 0500 Gross per 24 hour  Intake 240 ml  Output --  Net 240 ml   Filed Weights   02/01/22 0628  Weight: 102.1 kg    Physical Examination:  General: Obese built, not in obvious distress, anxious and agitated, on mittens. HENT:   No scleral pallor  or icterus noted. Oral mucosa is moist.  Chest:  Clear breath sounds.  Diminished breath sounds bilaterally. No crackles or wheezes.  CVS: S1 &S2 heard. No murmur.  Regular rate and rhythm. Abdomen: Soft, nontender, nondistended.  Bowel sounds are heard.   Extremities: No cyanosis, clubbing or edema.  Peripheral pulses are palpable.  Right hip tenderness. Psych: Alert, awake but confused and disoriented, has aphasia, on mittens CNS: Alert awake, repeating the same words, wants to go home, on mittens, moving upper extremities, Skin: Warm and dry.  No rashes noted.  Data Reviewed:   CBC: Recent Labs  Lab 02/01/22 0630  WBC 12.5*  NEUTROABS 10.3*  HGB 13.6  HCT 41.2  MCV 81.1  PLT [id number]    Basic Metabolic Panel: Recent Labs  Lab 02/01/22 0630  NA 135  K 3.6  CL 101  CO2 22  GLUCOSE 147*  BUN 10  CREATININE 0.60  CALCIUM 9.4    Liver Function Tests: Recent Labs  Lab 02/01/22 0630  AST 18  ALT 13  ALKPHOS 80  BILITOT 1.0  PROT 7.1  ALBUMIN 3.7     Radiology Studies: CT HIP RIGHT WO CONTRAST  Result Date: 02/01/2022 CLINICAL DATA:  Hip surgical planning EXAM: CT OF THE RIGHT HIP WITHOUT CONTRAST TECHNIQUE: Multidetector CT imaging of the right hip was performed according to the standard protocol. Multiplanar CT image reconstructions were also generated. RADIATION DOSE REDUCTION: This exam was performed according to the departmental dose-optimization program which includes automated exposure control, adjustment of the mA and/or kV according to patient size and/or use of iterative reconstruction technique. COMPARISON:  Pelvis radiograph 02/01/2022 FINDINGS: Bones/Joint/Cartilage There is a minimally displaced subcapital right femoral neck fracture with longitudinal component extending towards the base of the femoral neck. Small right hip joint effusion. 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 Ligaments Suboptimally assessed by CT. Muscles and Tendons No acute  myotendinous abnormality by CT. Soft tissues No focal fluid collection. Pelvic structures Sigmoid diverticulosis.  Normal appendix. IMPRESSION: Minimally displaced subcapital right femoral neck fracture with longitudinal component extending towards the base of the femoral neck. Small joint effusion. 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 Electronically Signed   By: Maurine Simmering M.D.   On: 02/01/2022 11:31   CT Head Wo Contrast  Result Date: 02/01/2022 CLINICAL DATA:  76 year old female with history of minor head trauma. EXAM: CT HEAD WITHOUT CONTRAST CT CERVICAL SPINE WITHOUT CONTRAST TECHNIQUE: Multidetector CT imaging of the head and cervical spine was performed following the standard protocol without intravenous contrast. Multiplanar CT image reconstructions of the cervical spine were also generated. RADIATION DOSE REDUCTION: This exam was performed according to the departmental dose-optimization program which includes automated exposure control, adjustment of the mA and/or kV according to patient size and/or use of iterative reconstruction technique. COMPARISON:  Head CT 12/02/2021. Brain MRI 12/03/2021. CT angiography of the neck 12/04/2021. FINDINGS: CT HEAD FINDINGS Brain: When compared to the prior head CT there  has been interval evolution of the previously noted left MCA territory infarct, with extensive areas of low-attenuation throughout the left temporal lobe and to a lesser extent the left parieto-occipital region, compatible with evolving encephalomalacia. 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 Mild cerebral atrophy. Physiologic calcifications in the basal ganglia incidentally noted. Patchy and confluent areas of decreased attenuation are noted throughout the deep and periventricular white matter of the cerebral hemispheres bilaterally, compatible with chronic microvascular ischemic disease. No evidence of acute infarction, hemorrhage, hydrocephalus, extra-axial collection or  mass lesion/mass effect. Vascular: Hyperdense left MCA. Atherosclerotic calcifications in the cerebral vasculature. Skull: Normal. Negative for fracture or focal lesion. Sinuses/Orbits: No acute finding. Other: None. CT CERVICAL SPINE FINDINGS Alignment: Normal. Skull base and vertebrae: No acute fracture. No primary bone lesion or focal pathologic process. Soft tissues and spinal canal: No prevertebral fluid or swelling. No visible canal hematoma. Disc levels: Multilevel degenerative disc disease, most pronounced at C4-C5 and C6-C7. Moderate multilevel facet arthropathy. Upper chest: Unremarkable. Other: None. IMPRESSION: 1. No definite acute intracranial abnormalities. 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 2. Extensive chronic microvascular ischemic changes in the cerebral white matter, as above. 3. No evidence of significant acute traumatic injury to the cervical spine. 4. Multilevel degenerative disc disease and cervical spondylosis, as above. Electronically Signed   By: Vinnie Langton M.D.   On: 02/01/2022 08:07   CT Cervical Spine Wo Contrast  Result Date: 02/01/2022 CLINICAL DATA:  76 year old female with history of minor head trauma. EXAM: CT HEAD WITHOUT CONTRAST CT CERVICAL SPINE WITHOUT CONTRAST TECHNIQUE: Multidetector CT imaging of the head and cervical spine was performed following the standard protocol without intravenous contrast. Multiplanar CT image reconstructions of the cervical spine were also generated. RADIATION DOSE REDUCTION: This exam was performed according to the departmental dose-optimization program which includes automated exposure control, adjustment of the mA and/or kV according to patient size and/or use of iterative reconstruction technique. COMPARISON:  Head CT 12/02/2021. Brain MRI 12/03/2021. CT angiography of the neck 12/04/2021. FINDINGS: CT HEAD FINDINGS Brain: When compared to the prior head CT there has been interval evolution of the  previously noted left MCA territory infarct, with extensive areas of low-attenuation throughout the left temporal lobe and to a lesser extent the left parieto-occipital region, compatible with evolving encephalomalacia. 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 Mild cerebral atrophy. Physiologic calcifications in the basal ganglia incidentally noted. Patchy and confluent areas of decreased attenuation are noted throughout the deep and periventricular white matter of the cerebral hemispheres bilaterally, compatible with chronic microvascular ischemic disease. No evidence of acute infarction, hemorrhage, hydrocephalus, extra-axial collection or mass lesion/mass effect. Vascular: Hyperdense left MCA. Atherosclerotic calcifications in the cerebral vasculature. Skull: Normal. Negative for fracture or focal lesion. Sinuses/Orbits: No acute finding. Other: None. CT CERVICAL SPINE FINDINGS Alignment: Normal. Skull base and vertebrae: No acute fracture. No primary bone lesion or focal pathologic process. Soft tissues and spinal canal: No prevertebral fluid or swelling. No visible canal hematoma. Disc levels: Multilevel degenerative disc disease, most pronounced at C4-C5 and C6-C7. Moderate multilevel facet arthropathy. Upper chest: Unremarkable. Other: None. IMPRESSION: 1. No definite acute intracranial abnormalities. 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 2. Extensive chronic microvascular ischemic changes in the cerebral white matter, as above. 3. No evidence of significant acute traumatic injury to the cervical spine. 4. Multilevel degenerative disc disease and cervical spondylosis, as above. Electronically Signed   By: Mauri Brooklyn.D.  On: 02/01/2022 08:07   DG FEMUR, MIN 2 VIEWS RIGHT  Result Date: 02/01/2022 CLINICAL DATA:  Fall with right hip pain. EXAM: RIGHT FEMUR 2 VIEWS; CHEST  1 VIEW; PELVIS - 1-2 VIEW COMPARISON:  Portable chest 12/02/2021. No prior pelvic  or femur films. FINDINGS: Chest: There is mild cardiomegaly. Aorta is mildly ectatic with atherosclerosis of the transverse segment. Stable mediastinum. No vascular congestion is seen. The lungs hypoexpanded but generally clear with limited view bases. There are multiple overlying monitor wires. Osteopenia.  Chronic healed fracture mid shaft left clavicle. There are moderate degenerative changes of the shoulders. Thoracic spondylosis. AP pelvis, single view: Osteopenia. There is no evidence of pelvic fractures or diastasis. Mild symmetric degenerative arthrosis is noted of the hips SI joints and pubic symphysis. Advanced degenerative change visualized lower lumbar spine. Right femoral series: Osteopenia. There is an acute right femoral neck fracture but it is difficult to adequately characterize due to positioning. As best as can be seen, this appears to be an oblique mid to distal femoral neck fracture with mild degree of impaction and otherwise nondisplaced. There is degenerative arthrosis at the hip and moderately the femorotibial joint. There is no erosive arthropathy. There are patchy calcifications in the femoral artery. There is artifact from overlying clothing. IMPRESSION: 1. No evidence of acute chest process. Hypoinflated with mild cardiomegaly. 2. Acute right femoral neck fracture, not well seen but appears to be an oblique mid to distal femoral neck fracture with mild impaction. 3. Osteopenia and degenerative change. 4. Calcific plaques in the femoral artery.  Aortic atherosclerosis. Electronically Signed   By: Telford Nab M.D.   On: 02/01/2022 07:17   DG Chest 1 View  Result Date: 02/01/2022 CLINICAL DATA:  Fall with right hip pain. EXAM: RIGHT FEMUR 2 VIEWS; CHEST  1 VIEW; PELVIS - 1-2 VIEW COMPARISON:  Portable chest 12/02/2021. No prior pelvic or femur films. FINDINGS: Chest: There is mild cardiomegaly. Aorta is mildly ectatic with atherosclerosis of the transverse segment. Stable mediastinum.  No vascular congestion is seen. The lungs hypoexpanded but generally clear with limited view bases. There are multiple overlying monitor wires. Osteopenia.  Chronic healed fracture mid shaft left clavicle. There are moderate degenerative changes of the shoulders. Thoracic spondylosis. AP pelvis, single view: Osteopenia. There is no evidence of pelvic fractures or diastasis. Mild symmetric degenerative arthrosis is noted of the hips SI joints and pubic symphysis. Advanced degenerative change visualized lower lumbar spine. Right femoral series: Osteopenia. There is an acute right femoral neck fracture but it is difficult to adequately characterize due to positioning. As best as can be seen, this appears to be an oblique mid to distal femoral neck fracture with mild degree of impaction and otherwise nondisplaced. There is degenerative arthrosis at the hip and moderately the femorotibial joint. There is no erosive arthropathy. There are patchy calcifications in the femoral artery. There is artifact from overlying clothing. IMPRESSION: 1. No evidence of acute chest process. Hypoinflated with mild cardiomegaly. 2. Acute right femoral neck fracture, not well seen but appears to be an oblique mid to distal femoral neck fracture with mild impaction. 3. Osteopenia and degenerative change. 4. Calcific plaques in the femoral artery.  Aortic atherosclerosis. Electronically Signed   By: Telford Nab M.D.   On: 02/01/2022 07:17   DG Pelvis 1-2 Views  Result Date: 02/01/2022 CLINICAL DATA:  Fall with right hip pain. EXAM: RIGHT FEMUR 2 VIEWS; CHEST  1 VIEW; PELVIS - 1-2 VIEW COMPARISON:  Portable chest  12/02/2021. No prior pelvic or femur films. FINDINGS: Chest: There is mild cardiomegaly. Aorta is mildly ectatic with atherosclerosis of the transverse segment. Stable mediastinum. No vascular congestion is seen. The lungs hypoexpanded but generally clear with limited view bases. There are multiple overlying monitor wires.  Osteopenia.  Chronic healed fracture mid shaft left clavicle. There are moderate degenerative changes of the shoulders. Thoracic spondylosis. AP pelvis, single view: Osteopenia. There is no evidence of pelvic fractures or diastasis. Mild symmetric degenerative arthrosis is noted of the hips SI joints and pubic symphysis. Advanced degenerative change visualized lower lumbar spine. Right femoral series: Osteopenia. There is an acute right femoral neck fracture but it is difficult to adequately characterize due to positioning. As best as can be seen, this appears to be an oblique mid to distal femoral neck fracture with mild degree of impaction and otherwise nondisplaced. There is degenerative arthrosis at the hip and moderately the femorotibial joint. There is no erosive arthropathy. There are patchy calcifications in the femoral artery. There is artifact from overlying clothing. IMPRESSION: 1. No evidence of acute chest process. Hypoinflated with mild cardiomegaly. 2. Acute right femoral neck fracture, not well seen but appears to be an oblique mid to distal femoral neck fracture with mild impaction. 3. Osteopenia and degenerative change. 4. Calcific plaques in the femoral artery.  Aortic atherosclerosis. Electronically Signed   By: Telford Nab M.D.   On: 02/01/2022 07:17      LOS: 1 day    Flora Lipps, MD [facility] Available via Epic secure chat 7am-7pm After these hours, please refer to coverage provider listed on [email or web address] 02/02/2022, 8:45 AM

## 2022-02-02 NOTE — Plan of Care (Signed)
  Problem: Coping: Goal: Level of anxiety will decrease Outcome: Not Progressing   Problem: Pain Managment: Goal: General experience of comfort will improve Outcome: Not Progressing   Problem: Safety: Goal: Ability to remain free from injury will improve Outcome: Not Progressing   

## 2022-02-02 NOTE — Transfer of Care (Signed)
Immediate Anesthesia Transfer of Care Note  Patient: Rebecca Barnes  Procedure(s) Performed: TOTAL HIP ARTHROPLASTY ANTERIOR APPROACH (Right: Hip)  Patient Location: PACU  Anesthesia Type:General  Level of Consciousness: drowsy, patient cooperative and responds to stimulation  Airway & Oxygen Therapy: Patient Spontanous Breathing  Post-op Assessment: Report given to RN and Post -op Vital signs reviewed and stable  Post vital signs: Reviewed and stable  Last Vitals:  Vitals Value Taken Time  BP 165/73 02/02/22 1525  Temp    Pulse 110 02/02/22 1528  Resp 7 02/02/22 1525  SpO2 95 % 02/02/22 1528  Vitals shown include unvalidated device data.  Last Pain:  Vitals:   02/02/22 0500  TempSrc: Oral  PainSc:       Patients Stated Pain Goal: 0 ([id number])  Complications: No notable events documented.

## 2022-02-02 NOTE — Anesthesia Procedure Notes (Signed)
Procedure Name: Intubation Date/Time: 02/02/2022 12:40 PM  Performed by: Janace Litten, CRNAPre-anesthesia Checklist: Patient identified, Emergency Drugs available, Suction available and Patient being monitored Patient Re-evaluated:Patient Re-evaluated prior to induction Oxygen Delivery Method: Circle System Utilized Preoxygenation: Pre-oxygenation with 100% oxygen Induction Type: IV induction Ventilation: Mask ventilation without difficulty Laryngoscope Size: Mac and 3 Grade View: Grade II Tube type: Oral Tube size: 7.0 mm Number of attempts: 1 Airway Equipment and Method: Stylet Placement Confirmation: ETT inserted through vocal cords under direct vision, positive ETCO2 and breath sounds checked- equal and bilateral Secured at: 22 cm Tube secured with: Tape Dental Injury: Teeth and Oropharynx as per pre-operative assessment

## 2022-02-02 NOTE — Anesthesia Preprocedure Evaluation (Addendum)
Anesthesia Evaluation  Patient identified by MRN, date of birth, ID band Patient confused    Reviewed: Allergy & Precautions, NPO status , Patient's Chart, lab work & pertinent test results  History of Anesthesia Complications Negative for: history of anesthetic complications  Airway Mallampati: III  TM Distance: >3 FB Neck ROM: Full    Dental no notable dental hx. (+) Teeth Intact, Dental Advisory Given   Pulmonary asthma ,    Pulmonary exam normal breath sounds clear to auscultation       Cardiovascular hypertension, Pt. on medications + Peripheral Vascular Disease  Normal cardiovascular exam Rhythm:Regular Rate:Normal  IMPRESSIONS   1. Left ventricular ejection fraction, by estimation, is 70 to 75%. The left ventricle has hyperdynamic function. The left ventricle has no regional wall motion abnormalities. Left ventricular diastolic function could not be evaluated. 2. Right ventricular systolic function was not well visualized. The right ventricular size is not well visualized. There is normal pulmonary artery systolic pressure. 3. The mitral valve is normal in structure. No evidence of mitral valve regurgitation. No evidence of mitral stenosis. 4. The aortic valve is normal in structure. Aortic valve regurgitation is not visualized. No aortic stenosis is present.   Neuro/Psych PSYCHIATRIC DISORDERS Anxiety Depression CVA (11/2021, aphasia, confusion), Residual Symptoms    GI/Hepatic negative GI ROS, Neg liver ROS,   Endo/Other  diabetes, Type 2, Oral Hypoglycemic Agents  Renal/GU negative Renal ROS  negative genitourinary   Musculoskeletal negative musculoskeletal ROS (+) Right hip fx   Abdominal   Peds  Hematology negative hematology ROS (+) On plavix, last dose 01/31/22   Anesthesia Other Findings   Reproductive/Obstetrics                          Anesthesia Physical Anesthesia  Plan  ASA: 3  Anesthesia Plan: General   Post-op Pain Management:    Induction: Intravenous  PONV Risk Score and Plan: 3 and Ondansetron, Treatment may vary due to age or medical condition and Dexamethasone  Airway Management Planned: Oral ETT  Additional Equipment: None  Intra-op Plan:   Post-operative Plan: Extubation in OR  Informed Consent: I have reviewed the patients History and Physical, chart, labs and discussed the procedure including the risks, benefits and alternatives for the proposed anesthesia with the patient or authorized representative who has indicated his/her understanding and acceptance.   Patient has DNR.  Discussed DNR with power of attorney and Suspend DNR.   Dental advisory given and Consent reviewed with POA  Plan Discussed with:   Anesthesia Plan Comments: (Lab Results      Component                Value               Date                      WBC                      12.5 (H)            02/01/2022                HGB                      13.6                02/01/2022  HCT                      41.2                02/01/2022                MCV                      81.1                02/01/2022                PLT                      388                 02/01/2022           Lab Results      Component                Value               Date                      NA                       135                 02/01/2022                K                        3.6                 02/01/2022                CO2                      22                  02/01/2022                GLUCOSE                  147 (H)             02/01/2022                BUN                      10                  02/01/2022                CREATININE               0.60                02/01/2022                CALCIUM                  9.4                 02/01/2022                EGFR  97                  12/02/2021                GFRNONAA                  >60                 02/01/2022          )     Anesthesia Quick Evaluation

## 2022-02-02 NOTE — Anesthesia Postprocedure Evaluation (Signed)
Anesthesia Post Note  Patient: Rebecca Barnes  Procedure(s) Performed: TOTAL HIP ARTHROPLASTY ANTERIOR APPROACH (Right: Hip)     Patient location during evaluation: PACU Anesthesia Type: Spinal Level of consciousness: awake and alert Pain management: pain level controlled Vital Signs Assessment: post-procedure vital signs reviewed and stable Respiratory status: spontaneous breathing and respiratory function stable Cardiovascular status: blood pressure returned to baseline and stable Postop Assessment: spinal receding Anesthetic complications: no   No notable events documented.  Last Vitals:  Vitals:   02/02/22 1555 02/02/22 1610  BP: 138/72 (!) 144/74  Pulse: (!) 110 (!) 109  Resp: 12 17  Temp:    SpO2: 92% 92%    Last Pain:  Vitals:   02/02/22 1610  TempSrc:   PainSc: Asleep                 Jusitn Salsgiver DANIEL

## 2022-02-02 NOTE — Progress Notes (Signed)
Ok to resume plavix POD1 per consult. Order has been placed.  Ulyses Southward, PharmD, BCIDP, AAHIVP, CPP Infectious Disease Pharmacist 02/02/2022 4:04 PM

## 2022-02-02 NOTE — Progress Notes (Signed)
Verbal consent completed via phone from daughter Lilybeth Ernsberger @ [phone]. Tonye Pearson., RN present at time of consent.

## 2022-02-02 NOTE — Op Note (Signed)
OPERATIVE REPORT  SURGEON: Rod Can, MD   ASSISTANT: Larene Pickett, PA-C  PREOPERATIVE DIAGNOSIS: Displaced Right femoral neck fracture.   POSTOPERATIVE DIAGNOSIS: Displaced Right femoral neck fracture.   PROCEDURE: Right total hip arthroplasty, anterior approach.   IMPLANTS: Biomet Taperloc Reduced Distal stem, size 14x148 mm, high offset. Biomet G7 OsseoTi Cup, size 52 mm. Biomet Vivacit-E liner, size 36 mm, E, neutral. Biomet Biolox ceramic head ball, size 36 + 0 mm. 1.8 mm adult reconstruction cable x1.  ANESTHESIA:  General and Regional  ANTIBIOTICS: 2g ancef.  ESTIMATED BLOOD LOSS:-300 mL    DRAINS: None.  COMPLICATIONS: None   CONDITION: PACU - hemodynamically stable.   BRIEF CLINICAL NOTE: Rebecca Barnes is a 76 y.o. female with a displaced Right femoral neck fracture. The patient was admitted to the hospitalist service and underwent perioperative risk stratification and medical optimization. The risks, benefits, and alternatives to total hip arthroplasty were explained, and the patient elected to proceed.  PROCEDURE IN DETAIL: The patient was taken to the operating room and general anesthesia was induced on the hospital bed.  The patient was then positioned on the Hana table.  All bony prominences were well padded.  The hip was prepped and draped in the normal sterile surgical fashion.  A time-out was called verifying side and site of surgery. Antibiotics were given within 60 minutes of beginning the procedure.   Bikini incision was made, and the direct anterior approach to the hip was performed through the Hueter interval.  Lateral femoral circumflex vessels were treated with the Auqumantys. The anterior capsule was exposed and an inverted T capsulotomy was made.  Fracture hematoma was encountered and evacuated. The patient was found to have a comminuted Right subcapital femoral neck fracture.  I freshened the femoral neck cut with a saw.  I removed the femoral neck  fragment.  A corkscrew was placed into the head and the head was removed.  This was passed to the back table and was measured. The pubofemoral ligament was released subperiosteally to the lesser trochanter.  Acetabular exposure was achieved, and the pulvinar and labrum were excised. Sequential reaming of the acetabulum was then performed up to a size 51 mm reamer under direct visulization. A 52 mm cup was then opened and impacted into place at approximately 40 degrees of abduction and 20 degrees of anteversion. The final polyethylene liner was impacted into place and acetabular osteophytes were removed.    I then gained femoral exposure taking care to protect the abductors and greater trochanter.  This was performed using standard external rotation, extension, and adduction.  A cookie cutter was used to enter the femoral canal, and then the femoral canal finder was placed.  There was a v shaped defect in the calcar, so I elected to place a single prophylactic adult reconstruction cable. Sequential broaching was performed up to a size 14.  Calcar planer was used on the femoral neck remnant.  I placed a high offset neck and a trial head ball.  The hip was reduced.  Leg lengths and offset were checked fluoroscopically.  The hip was dislocated and trial components were removed.  The final implants were placed, and the hip was reduced.  Fluoroscopy was used to confirm component position and leg lengths.  At 90 degrees of external rotation and full extension, the hip was stable to an anterior directed force.   The wound was copiously irrigated with Irrisept solution and normal saline using pule lavage.  Marcaine solution was injected  into the periarticular soft tissue.  The wound was closed in layers using #1 Stratafix for the fascia, 2-0 Vicryl for the subcutaneous fat, 2-0 Monocryl for the deep dermal layer, and staples + Dermabond for the skin.  Once the glue was fully dried, an Aquacell Ag dressing was applied.   The patient was transported to the recovery room in stable condition.  Sponge, needle, and instrument counts were correct at the end of the case x2.  The patient tolerated the procedure well and there were no known complications.  Please note that a surgical assistant was a medical necessity for this procedure to perform it in a safe and expeditious manner. Assistant was necessary to provide appropriate retraction of vital neurovascular structures, to prevent femoral fracture, and to allow for anatomic placement of the prosthesis.

## 2022-02-02 NOTE — Interval H&P Note (Signed)
History and Physical Interval Note:  02/02/2022 12:22 PM  Rebecca Barnes  has presented today for surgery, with the diagnosis of Right Hip Fracture.  The various methods of treatment have been discussed with the patient and family. After consideration of risks, benefits and other options for treatment, the patient has consented to  Procedure(s): TOTAL HIP ARTHROPLASTY ANTERIOR APPROACH (Right) as a surgical intervention.  The patient's history has been reviewed, patient examined, no change in status, stable for surgery.  I have reviewed the patient's chart and labs.  Questions were answered to the patient's satisfaction.    The risks, benefits, and alternatives were discussed with the patient/daughter. There are risks associated with the surgery including, but not limited to, problems with anesthesia (death), infection, instability (giving out of the joint), dislocation, differences in leg length/angulation/rotation, fracture of bones, loosening or failure of implants, hematoma (blood accumulation) which may require surgical drainage, blood clots, pulmonary embolism, nerve injury (foot drop and lateral thigh numbness), and blood vessel injury. The patient/daughter understand these risks and elects to proceed.    Iline Oven Inigo Lantigua

## 2022-02-03 ENCOUNTER — Encounter (HOSPITAL_COMMUNITY): Payer: Self-pay | Admitting: Orthopedic Surgery

## 2022-02-03 ENCOUNTER — Inpatient Hospital Stay (HOSPITAL_COMMUNITY): Payer: Medicare Other

## 2022-02-03 ENCOUNTER — Ambulatory Visit: Payer: Medicare Other | Admitting: Family

## 2022-02-03 DIAGNOSIS — S72001A Fracture of unspecified part of neck of right femur, initial encounter for closed fracture: Secondary | ICD-10-CM | POA: Diagnosis not present

## 2022-02-03 DIAGNOSIS — R4701 Aphasia: Secondary | ICD-10-CM | POA: Diagnosis not present

## 2022-02-03 DIAGNOSIS — Z66 Do not resuscitate: Secondary | ICD-10-CM | POA: Diagnosis not present

## 2022-02-03 DIAGNOSIS — F418 Other specified anxiety disorders: Secondary | ICD-10-CM | POA: Diagnosis not present

## 2022-02-03 LAB — BASIC METABOLIC PANEL
Anion gap: 6 (ref 5–15)
BUN: 17 mg/dL (ref 8–23)
CO2: 25 mmol/L (ref 22–32)
Calcium: 9 mg/dL (ref 8.9–10.3)
Chloride: 104 mmol/L (ref 98–111)
Creatinine, Ser: 0.83 mg/dL (ref 0.44–1.00)
GFR, Estimated: >60 mL/min (ref >60)
Glucose, Bld: 211 mg/dL — ABNORMAL HIGH (ref 70–99)
Potassium: 4 mmol/L (ref 3.5–5.1)
Sodium: 135 mmol/L (ref 135–145)

## 2022-02-03 LAB — CBC
HCT: 32 % — ABNORMAL LOW (ref 36.0–46.0)
Hemoglobin: 10.4 g/dL — ABNORMAL LOW (ref 12.0–15.0)
MCH: 26.4 pg (ref 26.0–34.0)
MCHC: 32.5 g/dL (ref 30.0–36.0)
MCV: 81.2 fL (ref 80.0–100.0)
Platelets: 351 10*3/uL (ref 150–400)
RBC: 3.94 MIL/uL (ref 3.87–5.11)
RDW: 14.1 % (ref 11.5–15.5)
WBC: 23.7 10*3/uL — ABNORMAL HIGH (ref 4.0–10.5)
nRBC: 0 % (ref 0.0–0.2)

## 2022-02-03 LAB — GLUCOSE, CAPILLARY
Glucose-Capillary: 180 mg/dL — ABNORMAL HIGH (ref 70–99)
Glucose-Capillary: 210 mg/dL — ABNORMAL HIGH (ref 70–99)
Glucose-Capillary: 186 mg/dL — ABNORMAL HIGH (ref 70–99)
Glucose-Capillary: 194 mg/dL — ABNORMAL HIGH (ref 70–99)

## 2022-02-03 LAB — MAGNESIUM: Magnesium: 1.9 mg/dL (ref 1.7–2.4)

## 2022-02-03 IMAGING — DX DG CHEST 1V PORT
1 series · 1 of 1 positions shown · non-contrast
Comparison: [DATE]

CLINICAL DATA: Fever, shortness of breath, recent fall

EXAM:
PORTABLE CHEST 1 VIEW

[chest ap]
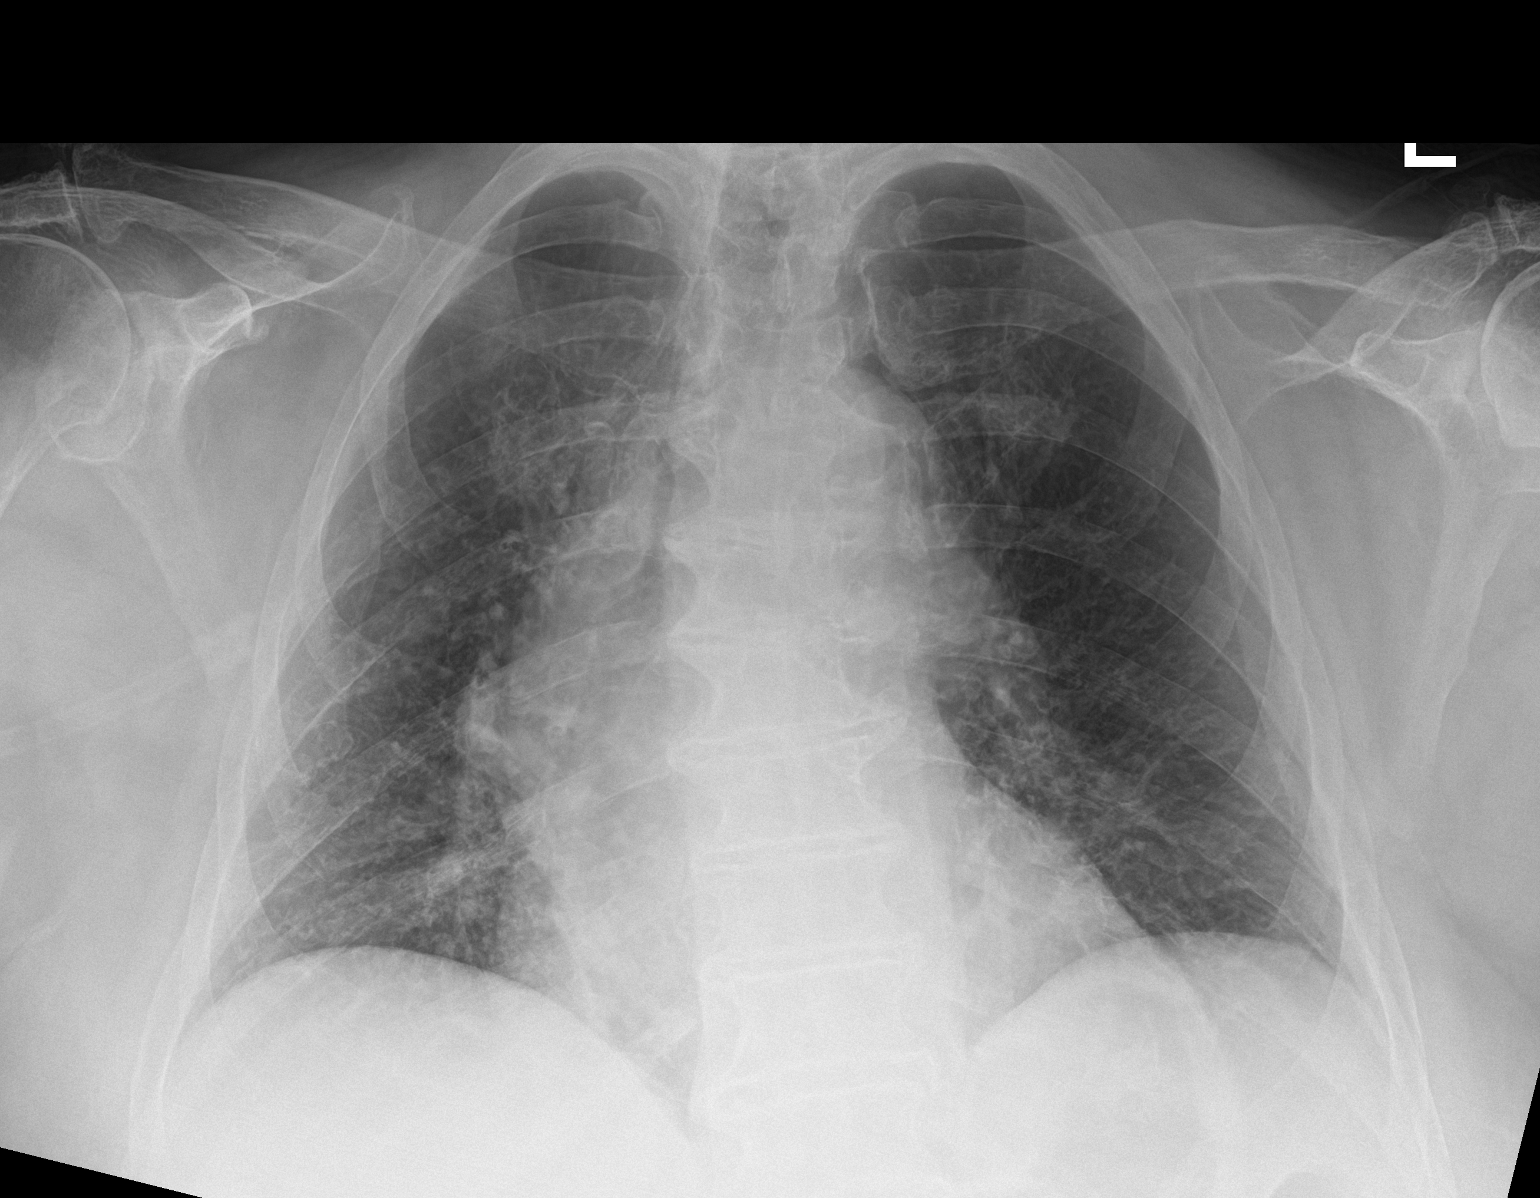

[1 of 1 positions shown; findings below may reference images not displayed]

FINDINGS: Transverse diameter of heart is slightly increased. Thoracic aorta
is tortuous. There are no signs of pulmonary edema or focal
consolidation. There is slight increase in interstitial markings in
the lower lung fields. There is no pleural effusion or pneumothorax.
IMPRESSION: Cardiomegaly. There is subtle interval increase in markings in the
lower lung fields. This may suggest crowding of markings due to poor
inspiration or subsegmental atelectasis or contusion.

## 2022-02-03 MED ORDER — ORAL CARE MOUTH RINSE
15.0000 mL | Freq: Two times a day (BID) | OROMUCOSAL | Status: DC
  Administered 2022-02-03 – 2022-02-08 (×9): 15 mL via OROMUCOSAL

## 2022-02-03 MED ORDER — ACETAMINOPHEN 325 MG PO TABS
650.0000 mg | ORAL_TABLET | Freq: Four times a day (QID) | ORAL | Status: DC | PRN
  Administered 2022-02-03 – 2022-02-08 (×6): 650 mg via ORAL
  Filled 2022-02-03 (×7): qty 2

## 2022-02-03 MED FILL — Heparin Sodium (Porcine) Inj 1000 Unit/ML: Qty: 1000 | Status: CN

## 2022-02-03 MED FILL — Potassium Chloride Inj 2 mEq/ML: INTRAVENOUS | Qty: 40 | Status: CN

## 2022-02-03 MED FILL — Magnesium Sulfate Inj 50%: INTRAMUSCULAR | Qty: 10 | Status: CN

## 2022-02-03 NOTE — TOC CAGE-AID Note (Signed)
Transition of Care [facility]) - CAGE-AID Screening   Patient Details  Name: Rebecca Barnes MRN: [id number] Date of Birth: [birth year]  Transition of Care [facility]) CM/SW Contact:    Lossie Faes Tarpley-Carter, LCSWA Phone Number: 02/03/2022, 10:17 AM   Clinical Narrative: Pt participated in Cage-Aid.  Per pts family there is no use substance or ETOH.  Pt was not offered resources, due to no usage of substance or ETOH.     Haruo Stepanek Tarpley-Carter, MSW, LCSW-A Pronouns:  She/Her/Hers [facility]Transitions of [facility] Clinical Social Worker Direct Number:  [phone] [email or web address]  CAGE-AID Screening:    Have You Ever Felt You Ought to Cut Down on Your Drinking or Drug Use?: No Have People Annoyed You By [employer] Your Drinking Or Drug Use?: No Have You Felt Bad Or Guilty About Your Drinking Or Drug Use?: No Have You Ever Had a Drink or Used Drugs First Thing In The Morning to Steady Your Nerves or to Get Rid of a Hangover?: No CAGE-AID Score: 0  Substance Abuse Education Offered: No

## 2022-02-03 NOTE — Evaluation (Signed)
Physical Therapy Evaluation Patient Details Name: Rebecca Barnes MRN: [id number] DOB: April 18, 1946 Today's Date: 02/03/2022  History of Present Illness  Rebecca Barnes is a 76 y.o. female admitted 6/12 after fall and found to have right femoral neck fracture.  Pt underwent right THA 6/13.  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  With her aphasia, she is really not able to effectively provide history, mostly sounds like word salad.    Has caregiver at home.  Clinical Impression  Pt admitted with above diagnosis. Pt was able to transfer to 3N1 and back to bed with min assist of 2 for safety as pt with impulsivity and decr safety awareness overall.  Noted family had been struggling at home with assisting pt per chart.  Will ask [facility] to evaluate and see if pt is appropriate for them and if not, she may need SNF at d/c with therapy.  Will follow acutely.  Pt currently with functional limitations due to the deficits listed below (see PT Problem List). Pt will benefit from skilled PT to increase their independence and safety with mobility to allow discharge to the venue listed below.          Recommendations for follow up therapy are one component of a multi-disciplinary discharge planning process, led by the attending physician.  Recommendations may be updated based on patient status, additional functional criteria and insurance authorization.  Follow Up Recommendations Acute inpatient rehab (3hours/day)    Assistance Recommended at Discharge Intermittent Supervision/Assistance  Patient can return home with the following  A lot of help with walking and/or transfers;A little help with bathing/dressing/bathroom;Assistance with  cooking/housework;Assist for transportation;Help with stairs or ramp for entrance    Equipment Recommendations None recommended by PT  Recommendations for Other Services  Rehab consult    Functional Status Assessment Patient has had a recent decline in their functional status and demonstrates the ability to make significant improvements in function in a reasonable and predictable amount of time.     Precautions / Restrictions Precautions Precautions: Fall Precaution Comments: Clarified with PA that pt does not have hip precautions Restrictions Weight Bearing Restrictions: No      Mobility  Bed Mobility Overal bed mobility: Needs Assistance Bed Mobility: Supine to Sit, Sit to Supine     Supine to sit: Min assist, +2 for safety/equipment Sit to supine: Min assist   General bed mobility comments: Required assist with pt pulling up on PT hand but pt was able to move her LEs to the EOB and a little help bringing them off.  Min assist to lie down.    Transfers Overall transfer level: Needs assistance Equipment used: Rolling walker (2 wheels) Transfers: Sit to/from Stand, Bed to chair/wheelchair/BSC Sit to Stand: Min assist, +2 safety/equipment, From elevated surface Stand pivot transfers: Min assist, +2 safety/equipment         General transfer comment: Pt was able to stand to RW with pt initiating movement prior to PT being ready for pt to move. Pt did need min assist to rise but was able to pivot with RW to 3N1 as she did seem ot need to use bathroom.  Pt maintained flexed posture throughout.  Pt moving alot on the 3n1 and impulsive needing constant supervision.  Pt stood to get back to bed with min assist as well +2 for safety and took step to bed and sat abruptly. Noted stool in buttocks therefore assisted back to bed and placed pt on bedpan as it was not safe to leave her on 3N1. Notified NT.    Ambulation/Gait                  Stairs            Wheelchair  Mobility    Modified Rankin (Stroke Patients Only)       Balance Overall balance assessment: Needs assistance Sitting-balance support: No upper extremity supported, Feet supported Sitting balance-Leahy Scale: Fair     Standing balance support: Bilateral upper extremity supported, During functional activity Standing balance-Leahy Scale: Poor Standing balance comment: relies on UE support and external suppoert                             Pertinent Vitals/Pain Pain Assessment Pain Assessment: Faces Faces Pain Scale: Hurts even more Pain Location: right hip Pain Descriptors / Indicators: Grimacing, Guarding, Discomfort Pain Intervention(s): Limited activity within patient's tolerance, Monitored during session, Repositioned    Home Living Family/patient expects to be discharged to:: Private residence Living Arrangements: Children;Other (Comment) Available Help at Discharge: Family;Personal care attendant;Available 24 hours/day (chart states pt has caregiver - need to confirm) Type of Home: House Home Access: Ramped entrance       Home Layout: Two level;Full bath on main level;Able to live on main level with bedroom/bathroom Home Equipment: Rolling Walker (2 wheels);Shower seat;Grab bars - toilet;Grab bars - tub/shower Additional Comments: all information taken from chart due to patient's communication deficits/expressive aphasia    Prior Function               Mobility Comments: Supervision for transfers due to right visual field deficit ADLs Comments: Unsure     Hand Dominance   Dominant Hand: Right    Extremity/Trunk Assessment   Upper Extremity Assessment Upper Extremity Assessment: Defer to OT evaluation    Lower Extremity Assessment Lower Extremity Assessment: Generalized weakness    Cervical / Trunk Assessment Cervical / Trunk Assessment: Kyphotic  Communication   Communication: Expressive difficulties (had moments of better speech with  word salad)  Cognition Arousal/Alertness: Awake/alert Behavior During Therapy: Restless, Anxious, Impulsive Overall Cognitive Status: Difficult to assess Area of Impairment: Awareness, Problem solving, Following commands                       Following Commands: Follows one step commands with increased time     Problem Solving: Difficulty sequencing, Requires verbal cues, Requires tactile cues General Comments: Pt impulsively trying to get up on arrival.  Pt impulsive with standing as well.  Pt appears confused but difficult to assess due to aphasia.        General Comments      Exercises     Assessment/Plan    PT Assessment Patient needs continued PT services  PT Problem List Decreased activity tolerance;Decreased balance;Decreased mobility;Decreased knowledge of use of DME;Decreased safety awareness;Decreased knowledge of precautions;Pain       PT Treatment Interventions DME instruction;Gait training;Functional mobility training;Therapeutic activities;Therapeutic exercise;Balance training;Patient/family education    PT Goals (Current goals can be found in the Care Plan section)  Acute Rehab PT Goals Patient Stated Goal: Pt unable to state PT Goal Formulation: With patient Time For Goal Achievement: 02/17/22 Potential  to Achieve Goals: Fair    Frequency Min 5X/week     Co-evaluation               AM-PAC PT "6 Clicks" Mobility  Outcome Measure Help needed turning from your back to your side while in a flat bed without using bedrails?: A Little Help needed moving from lying on your back to sitting on the side of a flat bed without using bedrails?: A Little Help needed moving to and from a bed to a chair (including a wheelchair)?: Total Help needed standing up from a chair using your arms (e.g., wheelchair or bedside chair)?: Total Help needed to walk in hospital room?: Total Help needed climbing 3-5 steps with a railing? : Total 6 Click Score: 10     End of Session Equipment Utilized During Treatment: Gait belt Activity Tolerance: Patient limited by fatigue Patient left: in bed;with call bell/phone within reach;with bed alarm set Nurse Communication: Mobility status (pt on bedpan) PT Visit Diagnosis: Unsteadiness on feet (R26.81);Muscle weakness (generalized) (M62.81);Pain Pain - Right/Left: Right Pain - part of body: Leg    Time: [id number] PT Time Calculation (min) (ACUTE ONLY): 17 min   Charges:   PT Evaluation $PT Eval Moderate Complexity: 1 Mod          Gurinder Toral M,PT Acute Rehab Services [phone]   Alvira Philips 02/03/2022, 10:03 AM

## 2022-02-03 NOTE — Progress Notes (Signed)
   02/03/22 1607  Assess: MEWS Score  Temp (!) 100.5 F (38.1 C)  BP (!) 101/53  MAP (mmHg) 67  Pulse Rate (!) 122  Resp 15  SpO2 93 %  O2 Device Nasal Cannula  O2 Flow Rate (L/min) 2 L/min  Assess: MEWS Score  MEWS Temp 1  MEWS Systolic 0  MEWS Pulse 2  MEWS RR 0  MEWS LOC 0  MEWS Score 3  MEWS Score Color Yellow  Assess: if the MEWS score is Yellow or Red  Were vital signs taken at a resting state? Yes  Focused Assessment Change from prior assessment (see assessment flowsheet)  Does the patient meet 2 or more of the SIRS criteria? Yes  Does the patient have a confirmed or suspected source of infection? No  MEWS guidelines implemented *See Row Information* No, previously yellow, continue vital signs every 4 hours  Notify: Provider  Provider Name/Title Joycelyn Das MD  Date Provider Notified 02/03/22  Time Provider Notified 1617  Method of Notification Page  Notification Reason Change in status  Provider response See new orders  Date of Provider Response 02/03/22  Time of Provider Response 1626  Assess: SIRS CRITERIA  SIRS Temperature  0  SIRS Pulse 1  SIRS Respirations  0  SIRS WBC 0  SIRS Score Sum  1   MD suggested incentive spirometer, pt unable to follow commands to use incentive spirometer

## 2022-02-03 NOTE — Progress Notes (Signed)
Inpatient Rehab Admissions Coordinator Note:   Per PT/OT recommendations patient was screened for CIR candidacy by Stephania Fragmin, PT. At this time, pt appears to be a potential candidate for CIR. I will place an order for rehab consult for full assessment, per our protocol.  Please contact me any with questions.Estill Dooms, PT, DPT Admissions Coordinator--[facility] [phone] 02/03/22 1:05 PM

## 2022-02-03 NOTE — Evaluation (Signed)
Occupational Therapy Evaluation Patient Details Name: Rebecca Barnes MRN: [id number] DOB: 08-06-1946 Today's Date: 02/03/2022   History of Present Illness Ambriana Barnes is a 76 y.o. female admitted 6/12 after fall and found to have right femoral neck fracture.  Pt underwent right THA 6/13.  PMH:   anxiety; hypertension, diabetes mellitus, CVA-Of note, patient was recently admitted from 4/12-16 with an acute L MCA CVA and was discharged to [facility] for rehab prior to return home.   Clinical Impression   PTA, pt lives with family with recent DC from CIR services at an overall Supervision level for ADLs/mobility using RW.  Pt presents now with diagnoses above and deficits below. Pt restless, anxious and impulsive with transfers requiring Min A x 2 for safety to/from [facility] using RW. Pt requires Mod A for UB ADL and Max A for LB ADLs due to deficits. Noted per MD note that pt with difficulty proving assist for pt at home (will need to confirm) and may benefit from postacute rehab prior to return home to decrease fall risk and maximize independence.      Recommendations for follow up therapy are one component of a multi-disciplinary discharge planning process, led by the attending physician.  Recommendations may be updated based on patient status, additional functional criteria and insurance authorization.   Follow Up Recommendations  Acute inpatient rehab (3hours/day)    Assistance Recommended at Discharge Frequent or constant Supervision/Assistance  Patient can return home with the following A lot of help with bathing/dressing/bathroom;Assistance with cooking/housework;Direct supervision/assist for medications management;Direct supervision/assist for financial management;Help with stairs or ramp for entrance;Assist for transportation    Functional Status Assessment  Patient has had a recent decline in their functional status and demonstrates the ability to make significant improvements in function in a  reasonable and predictable amount of time.  Equipment Recommendations  None recommended by OT    Recommendations for Other Services Rehab consult     Precautions / Restrictions Precautions Precautions: Fall Precaution Comments: Clarified with PA that pt does not have hip precautions Restrictions Weight Bearing Restrictions: Yes RLE Weight Bearing: Weight bearing as tolerated      Mobility Bed Mobility Overal bed mobility: Needs Assistance Bed Mobility: Supine to Sit, Sit to Supine     Supine to sit: Min assist, +2 for safety/equipment Sit to supine: Min assist   General bed mobility comments: Required assist with pt pulling up on PT hand but pt was able to move her LEs to the EOB and a little help bringing them off.  Min assist to lie down.    Transfers Overall transfer level: Needs assistance Equipment used: Rolling walker (2 wheels) Transfers: Sit to/from Stand, Bed to chair/wheelchair/BSC Sit to Stand: Min assist, +2 safety/equipment, From elevated surface Stand pivot transfers: Min assist, +2 safety/equipment         General transfer comment: Pt was able to stand to RW with pt initiating movement prior to PT being ready for pt to move. Pt did need min assist to rise but was able to pivot with RW to 3N1 as she did seem ot need to use bathroom.  Pt maintained flexed posture throughout.  Pt moving alot on the 3n1 and impulsive needing constant supervision. Pt stood to get back to bed with min assist as well +2 for safety and took step to bed and sat abruptly. Noted stool in buttocks therefore assisted back to bed and placed pt on bedpan as it was not safe to leave her on 3N1.  Notified NT.      Balance Overall balance assessment: Needs assistance Sitting-balance support: No upper extremity supported, Feet supported Sitting balance-Leahy Scale: Fair     Standing balance support: Bilateral upper extremity supported, During functional activity Standing balance-Leahy  Scale: Poor Standing balance comment: relies on UE support and external suppoert                           ADL either performed or assessed with clinical judgement   ADL Overall ADL's : Needs assistance/impaired Eating/Feeding: Supervision/ safety;Sitting   Grooming: Minimal assistance;Sitting Grooming Details (indicate cue type and reason): likely able to complete but actively resisted when hand over hand provided to initiate washing face Upper Body Bathing: Sitting;Moderate assistance   Lower Body Bathing: Maximal assistance;Sit to/from stand   Upper Body Dressing : Moderate assistance   Lower Body Dressing: Maximal assistance;Sit to/from stand   Toilet Transfer: Minimal assistance;+2 for physical assistance;+2 for safety/equipment;BSC/3in1;Rolling walker (2 wheels) Toilet Transfer Details (indicate cue type and reason): [facility]t suspected toileting needs. [facility] w/o void[facility]. placed on bedpan at end of session for safety and notified NT Toileting- Clothing Manipulation and Hygiene: Maximal assistance;Sit to/from stand         General ADL Comments: Limited by impulsivity, anxiety and difficult to determine needs along with aphasia. able to move fairly well despite R LE fx/sx     Vision Ability to See in Adequate Light: 1 Impaired Patient Visual Report: Other (comment) (hx of R visual field deficit from CVA) Vision Assessment?: Vision impaired- to be further tested in functional context Additional Comments: hx of R visual field deficit due to recent CVA. difficult to fully assess d/t pt anxiety and impulsivity today     Perception     Praxis      Pertinent Vitals/Pain Pain Assessment Pain Assessment: Faces Faces Pain Scale: Hurts even more Pain Location: right hip Pain Descriptors / Indicators: Grimacing, Guarding, Discomfort Pain Intervention(s): Limited activity within patient's tolerance, Monitored during session     Hand  Dominance Right   Extremity/Trunk Assessment Upper Extremity Assessment Upper Extremity Assessment: Difficult to assess due to impaired cognition;Generalized weakness   Lower Extremity Assessment Lower Extremity Assessment: Defer to PT evaluation   Cervical / Trunk Assessment Cervical / Trunk Assessment: Kyphotic   Communication Communication Communication: Expressive difficulties (had moments of better speech with word salad)   Cognition Arousal/Alertness: Awake/alert Behavior During Therapy: Restless, Anxious, Impulsive Overall Cognitive Status: Difficult to assess Area of Impairment: Awareness, Problem solving, Following commands                       Following Commands: Follows one step commands with increased time     Problem Solving: Difficulty sequencing, Requires verbal cues, Requires tactile cues General Comments: Pt impulsively trying to get up on arrival.  Pt impulsive with standing as well.  Pt appears confused and anxious but difficult to assess due to aphasia.     General Comments       Exercises     Shoulder Instructions      Home Living Family/patient expects to be discharged to:: Private residence Living Arrangements: Children;Other (Comment) Available Help at Discharge: Family;Personal care attendant;Available 24 hours/day (chart states pt has caregiver - need to confirm) Type of Home: House Home Access: Ramped entrance     Home Layout: Two level;Full bath on main level;Able to live on main level with  bedroom/bathroom     Bathroom Shower/Tub: Producer, television/film/video: Handicapped height     Home Equipment: Agricultural consultant (2 wheels);Shower seat;Grab bars - toilet;Grab bars - tub/shower   Additional Comments: all information taken from chart due to patient's communication deficits/expressive aphasia      Prior Functioning/Environment Prior Level of Function : Needs assist       Physical Assist : Mobility (physical);ADLs  (physical) Mobility (physical): Gait ADLs (physical): Bathing;IADLs Mobility Comments: Supervision for transfers due to right visual field deficit, was able to ambulate with RW at DC from CIR ADLs Comments: Pt typically Mod I with ADLs. Noted per chart, pt min guard for showering tasks d/t anxiety at CIR DC, completing other ADLs with Supervision at most        OT Problem List: Decreased strength;Decreased activity tolerance;Impaired balance (sitting and/or standing);Decreased cognition;Decreased safety awareness;Decreased knowledge of use of DME or AE;Pain      OT Treatment/Interventions: Self-care/ADL training;Therapeutic exercise;Energy conservation;Therapeutic activities;Patient/family education;Balance training    OT Goals(Current goals can be found in the care plan section) Acute Rehab OT Goals Patient Stated Goal: pt unable to state, restless OT Goal Formulation: Patient unable to participate in goal setting Time For Goal Achievement: 02/17/22 Potential to Achieve Goals: Good ADL Goals Pt Will Perform Lower Body Bathing: with supervision;sit to/from stand Pt Will Transfer to Toilet: with supervision;ambulating Pt Will Perform Toileting - Clothing Manipulation and hygiene: with supervision;sit to/from stand;sitting/lateral leans  OT Frequency: Min 2X/week    Co-evaluation PT/OT/SLP Co-Evaluation/Treatment: Yes Reason for Co-Treatment: For patient/therapist safety;Necessary to address cognition/behavior during functional activity;To address functional/ADL transfers   OT goals addressed during session: ADL's and self-care      AM-PAC OT "6 Clicks" Daily Activity     Outcome Measure Help from another person eating meals?: None Help from another person taking care of personal grooming?: A Little Help from another person toileting, which includes using toliet, bedpan, or urinal?: A Lot Help from another person bathing (including washing, rinsing, drying)?: A Lot Help from  another person to put on and taking off regular upper body clothing?: A Little Help from another person to put on and taking off regular lower body clothing?: A Lot 6 Click Score: 16   End of Session Equipment Utilized During Treatment: Gait belt;Rolling walker (2 wheels) Nurse Communication: Precautions;Weight bearing status  Activity Tolerance: Other (comment) (limited by anxiety/cognition) Patient left: in bed;with call bell/phone within reach;with bed alarm set;Other (comment) (on bedpan; NT aware)  OT Visit Diagnosis: Unsteadiness on feet (R26.81);Other abnormalities of gait and mobility (R26.89);Other symptoms and signs involving cognitive function                Time: 2992-4268 OT Time Calculation (min): 18 min Charges:  OT General Charges $OT Visit: 1 Visit OT Evaluation $OT Eval Moderate Complexity: 1 Mod  Bradd Canary, OTR/L Acute Rehab Services Office: [phone]   Lorre Munroe 02/03/2022, 10:51 AM

## 2022-02-03 NOTE — Progress Notes (Signed)
Subjective:  Unable to assess patients pain and history as patient is a poor historian due to aphasia. Nurse doesn't report any acute events overnight.   Objective:   VITALS:   Vitals:   02/02/22 2025 02/02/22 2346 02/03/22 0001 02/03/22 0400  BP: (!) 149/80 98/63 (!) 142/75 127/80  Pulse: (!) 129 (!) 115 (!) 110 (!) 115  Resp: _0 Temp: 98 F (36.7 C) 97.7 F (36.5 C) 97.8 F (36.6 C) 97.7 F (36.5 C)  TempSrc: Oral  Oral Oral  SpO2: 98% 98% 97% 98%  Weight:      Height:        Patient is lying in bed. Patient is calling out for help but unable to communicate needs. She does not appear to be in pain with movement of RLE. Physical exam limited due to patients aphasia. Patient not following commands. ABD soft Neurovascular intact Sensation intact distally Intact pulses distally Dorsiflexion/Plantar flexion intact Incision: dressing C/D/I No cellulitis present Compartment soft  ABD soft, patient does not wince with palpation to abdomen.  Unable to assess sensation but patient does respond with squeezing foot. Motor function appears intact with slight movement seen of toes and foot during exam.  No cellulitis present.  Compartment soft SCDs in place. Aquacel dressing in place. C/D/I.  Nystatin powder in pannicular fold. Slight improvement in redness in pannicular fold. Palpable pedal pulses bilaterally. Capillary refill <2 seconds.  Lab Results  Component Value Date   WBC 23.7 (H) 02/03/2022   HGB 10.4 (L) 02/03/2022   HCT 32.0 (L) 02/03/2022   MCV 81.2 02/03/2022   PLT 351 02/03/2022   BMET    Component Value Date/Time   NA 135 02/03/2022 0136   K 4.0 02/03/2022 0136   CL 104 02/03/2022 0136   CO2 25 02/03/2022 0136   GLUCOSE 211 (H) 02/03/2022 0136   BUN 17 02/03/2022 0136   CREATININE 0.83 02/03/2022 0136   CREATININE 0.52 (L) 12/02/2021 1103   CALCIUM 9.0 02/03/2022 0136   EGFR 97 12/02/2021 1103   GFRNONAA >60 02/03/2022 0136      Assessment/Plan: 1 Day Post-Op   Principal Problem:   Hip fracture (HCC) Active Problems:   Anxiety with depression   Type 2 diabetes mellitus without complication, without long-term current use of insulin (HCC)   Essential hypertension   History of CVA (cerebrovascular accident)   Obesity (BMI 30-39.9)   Aphasia   Dyslipidemia   DNR (do not resuscitate)   WBAT with walker DVT ppx: Aspirin and plavix, SCDs, TEDS PT/OT: PT has not seen patient yet. PT to see patient.  Dispo: Patient is under hospitalist care. PT to come evaluate patient. Continue to monitor.   Charlott Rakes, PA-C 02/03/2022, 7:11 AM  [facility]  [place] [address], [address], [place], [place] [address] Phone: [phone] [email or web address] Facebook  [employer]

## 2022-02-03 NOTE — Progress Notes (Addendum)
PROGRESS NOTE    Rebecca Barnes  ESP:[id number] DOB: [birth year] DOA: 02/01/2022 PCP: Caesar Bookman, NP    Brief Narrative:  Rebecca Barnes is a 76 y.o. female with medical history significant of anxiety; hypertension, diabetes mellitus, CVA presented to hospital after sustaining a fall.  Of note, patient was recently admitted from 4/12-16 with an acute L MCA CVA and was discharged to [facility]; she remained in CIR from 4/16-23 and at the time of discharge required supervision for transfers due to R visual field deficit, aphasia, and apraxia.  With her aphasia, she is really not able to effectively provide history, mostly sounds like word salad.  Patient has been having difficulty communicating.  Has caregiver at home.  In the ED, patient was noted to have right femoral neck fracture. CT of the head was negative.  Orthopedics was consulted and underwent right total hip arthroplasty on 02/02/2022.   Assessment and plan  Principal Problem:   Hip fracture (HCC) Active Problems:   Anxiety with depression   Type 2 diabetes mellitus without complication, without long-term current use of insulin (HCC)   Essential hypertension   History of CVA (cerebrovascular accident)   Obesity (BMI 30-39.9)   Aphasia   Dyslipidemia   DNR (do not resuscitate)   Right femoral neck fracture status post right total hip arthroplasty 02/02/2022 Status post mechanical fall with right hip fracture.  Continue supportive care including muscle relaxant analgesics.  Physical therapy has been consulted.  Continue aspirin Plavix.  Orthopedics has seen the patient.  Significant leukocytosis.  WBC today at 23.7.  Could be reactive.  We will continue to monitor   Recent CVA with persistent aphasia Continue aspirin and Plavix for likely need CIR after surgical intervention.  Patient's family is having a difficult time at home trying to take care of her needs as well.    Mood disorder -Continue fluoxetine hydroxyzine Klonopin.     Essential HTN -Continue Norvasc, losartan.  Blood pressure seems to be stable at this time.   Hyperlipidemia  Intolerant to statins.  On fenofibrate.  Diabetes mellitus type 2 Recent hemoglobin A1c was 6.3, continue sliding scale insulin.  Hold metformin for now   obesity -Body mass index is 37.44 kg/m. Would benefit from weight loss as outpatient.     DVT prophylaxis: SCDs Start: 02/02/22 1640   Code Status:     Code Status: DNR  Disposition: Possible need for CIR.  Status is: Inpatient  Remains inpatient appropriate because: Need for surgical intervention and possible placement.   Family Communication:  Spoke with the patient's daughter at bedside on 02/03/2022  Consultants:  Orthopedics  Procedures:  Right total hip arthroplasty on 02/02/2022  Antimicrobials:  Preoperative cefazolin  Anti-infectives (From admission, onward)    Start     Dose/Rate Route Frequency Ordered Stop   02/02/22 1700  ceFAZolin (ANCEF) IVPB 2g/100 mL premix        2 g 200 mL/hr over 30 Minutes Intravenous Every 6 hours 02/02/22 1639 02/03/22 0018   02/02/22 1645  fluconazole (DIFLUCAN) tablet 150 mg        150 mg Oral Daily 02/02/22 1639 02/05/22 0959   02/02/22 1100  ceFAZolin (ANCEF) IVPB 2g/100 mL premix        2 g 200 mL/hr over 30 Minutes Intravenous On call to O.R. 02/02/22 0419 02/02/22 1242      Subjective: Today, patient was seen and examined at bedside.  Poor historian with underlying aphasia.  Patient's daughter at bedside.  Appears anxious at times.    Objective: Vitals:   02/02/22 2346 02/03/22 0001 02/03/22 0400 02/03/22 0732  BP: 98/63 (!) 142/75 127/80 110/64  Pulse: (!) 115 (!) 110 (!) 115 (!) 129  Resp: 16 17 18 20   Temp: 97.7 F (36.5 C) 97.8 F (36.6 C) 97.7 F (36.5 C) 98 F (36.7 C)  TempSrc:  Oral Oral Oral  SpO2: 98% 97% 98% 93%  Weight:      Height:        Intake/Output Summary (Last 24 hours) at 02/03/2022 0838 Last data filed at 02/03/2022  0400 Gross per 24 hour  Intake 2510 ml  Output 850 ml  Net 1660 ml   Filed Weights   02/01/22 0628  Weight: 102.1 kg    Physical Examination:  General: Obese built, not in obvious distress, mildly anxious, repeating the same thing, HENT:   No scleral pallor or icterus noted. Oral mucosa is moist.  Chest:  Clear breath sounds.  Diminished breath sounds bilaterally. No crackles or wheezes.  CVS: S1 &S2 heard. No murmur.  Regular rate and rhythm. Abdomen: Soft, nontender, nondistended.  Bowel sounds are heard.   Extremities: No cyanosis, clubbing or edema.  Peripheral pulses are palpable. Psych: Alert, awake and communicative but has aphasia, recognizes her daughter at bedside, CNS: Aphasia, alert awake, moving upper extremities, Skin: Warm and dry.  No rashes noted.  Data Reviewed:   CBC: Recent Labs  Lab 02/01/22 0630 02/03/22 0136  WBC 12.5* 23.7*  NEUTROABS 10.3*  --   HGB 13.6 10.4*  HCT 41.2 32.0*  MCV 81.1 81.2  PLT 388 351    Basic Metabolic Panel: Recent Labs  Lab 02/01/22 0630 02/03/22 0136  NA 135 135  K 3.6 4.0  CL 101 104  CO2 22 25  GLUCOSE 147* 211*  BUN 10 17  CREATININE 0.60 0.83  CALCIUM 9.4 9.0  MG  --  1.9    Liver Function Tests: Recent Labs  Lab 02/01/22 0630  AST 18  ALT 13  ALKPHOS 80  BILITOT 1.0  PROT 7.1  ALBUMIN 3.7     Radiology Studies: Pelvis Portable  Result Date: 02/02/2022 CLINICAL DATA:  Post right hip replacement EXAM: PORTABLE PELVIS 1-2 VIEWS COMPARISON:  None Available. FINDINGS: Post right hip total arthroplasty with single cerclage wire. Postoperative changes in the soft tissues. No evidence of complication. 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 Electronically Signed   By: Guadlupe SpanishPraneil  Patel M.D.   On: 02/02/2022 16:15   DG HIP UNILAT WITH PELVIS 2-3 VIEWS RIGHT  Result Date: 02/02/2022 CLINICAL DATA:  Right hip arthroplasty EXAM: DG HIP (WITH OR WITHOUT PELVIS) 2-3V RIGHT  COMPARISON:  02/01/2022 FINDINGS: Three fluoroscopic images are obtained during the performance of the procedure and are provided for interpretation only. Images demonstrate right total hip arthroplasty, in near anatomic alignment. No perihardware lucency or fracture. Fluoroscopy time: 19 seconds 2.28 mGy IMPRESSION: Right total hip arthroplasty. Electronically Signed   By: Wiliam KeAlison  Vasan M.D.   On: 02/02/2022 15:15   DG C-Arm 1-60 Min-No Report  Result Date: 02/02/2022 Fluoroscopy was utilized by the requesting physician.  No radiographic interpretation.   DG C-Arm 1-60 Min-No Report  Result Date: 02/02/2022 Fluoroscopy was utilized by the requesting physician.  No radiographic interpretation.   CT HIP RIGHT WO CONTRAST  Result Date: 02/01/2022 CLINICAL DATA:  Hip surgical planning EXAM: CT OF THE RIGHT HIP WITHOUT CONTRAST TECHNIQUE: Multidetector CT imaging of the right  hip was performed according to the standard protocol. Multiplanar CT image reconstructions were also generated. RADIATION DOSE REDUCTION: This exam was performed according to the departmental dose-optimization program which includes automated exposure control, adjustment of the mA and/or kV according to patient size and/or use of iterative reconstruction technique. COMPARISON:  Pelvis radiograph 02/01/2022 FINDINGS: Bones/Joint/Cartilage There is a minimally displaced subcapital right femoral neck fracture with longitudinal component extending towards the base of the femoral neck. Small right hip joint effusion. 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 Ligaments Suboptimally assessed by CT. Muscles and Tendons No acute myotendinous abnormality by CT. Soft tissues No focal fluid collection. Pelvic structures Sigmoid diverticulosis.  Normal appendix. IMPRESSION: Minimally displaced subcapital right femoral neck fracture with longitudinal component extending towards the base of the femoral neck. Small joint effusion. Mild-to-moderate  right hip osteoarthritis. Electronically Signed   By: Caprice Renshaw M.D.   On: 02/01/2022 11:31      LOS: 2 days    Joycelyn Das, MD [facility] Available via Epic secure chat 7am-7pm After these hours, please refer to coverage provider listed on [email or web address] 02/03/2022, 8:38 AM

## 2022-02-04 DIAGNOSIS — S72001A Fracture of unspecified part of neck of right femur, initial encounter for closed fracture: Secondary | ICD-10-CM | POA: Diagnosis not present

## 2022-02-04 DIAGNOSIS — R509 Fever, unspecified: Secondary | ICD-10-CM

## 2022-02-04 DIAGNOSIS — R4701 Aphasia: Secondary | ICD-10-CM | POA: Diagnosis not present

## 2022-02-04 DIAGNOSIS — F418 Other specified anxiety disorders: Secondary | ICD-10-CM | POA: Diagnosis not present

## 2022-02-04 DIAGNOSIS — G9341 Metabolic encephalopathy: Secondary | ICD-10-CM

## 2022-02-04 LAB — BASIC METABOLIC PANEL
Anion gap: 8 (ref 5–15)
BUN: 30 mg/dL — ABNORMAL HIGH (ref 8–23)
CO2: 22 mmol/L (ref 22–32)
Calcium: 8.6 mg/dL — ABNORMAL LOW (ref 8.9–10.3)
Chloride: 102 mmol/L (ref 98–111)
Creatinine, Ser: 1.35 mg/dL — ABNORMAL HIGH (ref 0.44–1.00)
GFR, Estimated: 41 mL/min — ABNORMAL LOW (ref >60)
Glucose, Bld: 191 mg/dL — ABNORMAL HIGH (ref 70–99)
Potassium: 3.5 mmol/L (ref 3.5–5.1)
Sodium: 132 mmol/L — ABNORMAL LOW (ref 135–145)

## 2022-02-04 LAB — CBC
HCT: 26.1 % — ABNORMAL LOW (ref 36.0–46.0)
Hemoglobin: 8.7 g/dL — ABNORMAL LOW (ref 12.0–15.0)
MCH: 26.9 pg (ref 26.0–34.0)
MCHC: 33.3 g/dL (ref 30.0–36.0)
MCV: 80.6 fL (ref 80.0–100.0)
Platelets: 287 10*3/uL (ref 150–400)
RBC: 3.24 MIL/uL — ABNORMAL LOW (ref 3.87–5.11)
RDW: 14.3 % (ref 11.5–15.5)
WBC: 18.7 10*3/uL — ABNORMAL HIGH (ref 4.0–10.5)
nRBC: 0 % (ref 0.0–0.2)

## 2022-02-04 LAB — GLUCOSE, CAPILLARY
Glucose-Capillary: 230 mg/dL — ABNORMAL HIGH (ref 70–99)
Glucose-Capillary: 155 mg/dL — ABNORMAL HIGH (ref 70–99)
Glucose-Capillary: 155 mg/dL — ABNORMAL HIGH (ref 70–99)
Glucose-Capillary: 137 mg/dL — ABNORMAL HIGH (ref 70–99)

## 2022-02-04 LAB — MAGNESIUM: Magnesium: 1.9 mg/dL (ref 1.7–2.4)

## 2022-02-04 LAB — PROCALCITONIN: Procalcitonin: 0.38 ng/mL

## 2022-02-04 MED ORDER — MORPHINE SULFATE (PF) 2 MG/ML IV SOLN
2.0000 mg | INTRAVENOUS | Status: DC | PRN
  Administered 2022-02-04 – 2022-02-08 (×2): 2 mg via INTRAVENOUS
  Filled 2022-02-04 (×2): qty 1

## 2022-02-04 MED ORDER — CLONAZEPAM 0.5 MG PO TABS
0.5000 mg | ORAL_TABLET | Freq: Every day | ORAL | Status: DC
  Administered 2022-02-05 – 2022-02-08 (×4): 0.5 mg via ORAL
  Filled 2022-02-04 (×4): qty 1

## 2022-02-04 MED ORDER — POLYETHYLENE GLYCOL 3350 17 G PO PACK
17.0000 g | PACK | Freq: Every day | ORAL | Status: DC
  Administered 2022-02-04 – 2022-02-09 (×4): 17 g via ORAL
  Filled 2022-02-04 (×5): qty 1

## 2022-02-04 MED ORDER — OXYCODONE HCL 5 MG PO TABS
5.0000 mg | ORAL_TABLET | ORAL | Status: DC | PRN
  Administered 2022-02-04 – 2022-02-08 (×5): 5 mg via ORAL
  Filled 2022-02-04 (×6): qty 1

## 2022-02-04 MED FILL — Heparin Sodium (Porcine) Inj 1000 Unit/ML: Qty: 1000 | Status: AC

## 2022-02-04 MED FILL — Magnesium Sulfate Inj 50%: INTRAMUSCULAR | Qty: 10 | Status: AC

## 2022-02-04 MED FILL — Potassium Chloride Inj 2 mEq/ML: INTRAVENOUS | Qty: 40 | Status: AC

## 2022-02-04 NOTE — Progress Notes (Signed)
   02/04/22 0820  Assess: MEWS Score  Temp 98 F (36.7 C)  BP (!) 114/58  Pulse Rate (!) 107  Resp 17  Level of Consciousness Alert  SpO2 95 %  O2 Device Room Air  Assess: MEWS Score  MEWS Temp 0  MEWS Systolic 0  MEWS Pulse 1  MEWS RR 0  MEWS LOC 0  MEWS Score 1  MEWS Score Color Green  Assess: if the MEWS score is Yellow or Red  Were vital signs taken at a resting state? Yes  Focused Assessment No change from prior assessment  Does the patient meet 2 or more of the SIRS criteria? Yes  Does the patient have a confirmed or suspected source of infection? No  MEWS guidelines implemented *See Row Information* No, previously yellow, continue vital signs every 4 hours  Treat  Neuro symptoms relieved by Anti-anxiety medication;Music  Notify: Charge Nurse/RN  Name of Charge Nurse/RN Notified Jade RN  Date Charge Nurse/RN Notified 02/04/22  Time Charge Nurse/RN Notified 0800  Assess: SIRS CRITERIA  SIRS Temperature  0  SIRS Pulse 1  SIRS Respirations  0  SIRS WBC 1  SIRS Score Sum  2

## 2022-02-04 NOTE — Progress Notes (Signed)
PROGRESS NOTE    Rebecca Barnes  TIR:[id number] DOB: 06-05-1946 DOA: 02/01/2022 PCP: Caesar Bookman, NP    Brief Narrative:  Rebecca Barnes is a 76 y.o. female with medical history significant of anxiety; hypertension, diabetes mellitus, CVA presented to hospital after sustaining a fall.  Of note, patient was recently admitted from 4/12-16 with an acute L MCA CVA and was discharged to [facility]; she remained in CIR from 4/16-23 and at the time of discharge required supervision for transfers due to R visual field deficit, aphasia, and apraxia.  With her aphasia, she is really not able to effectively provide history, mostly sounded like word salad.  Patient has been having difficulty communicating.  Has caregiver at home.  In the ED, patient was noted to have right femoral neck fracture. CT of the head was negative.  Orthopedics was consulted and underwent right total hip arthroplasty on 02/02/2022.   After hospitalization, patient underwent hip arthroplasty.  Patient continues to have episodes of agitation anxiety restlessness with aphasia and required one-to-one sitter for safety.  She did have a low-grade fever during hospitalization as well.  Assessment and plan  Principal Problem:   Hip fracture (HCC) Active Problems:   Anxiety with depression   Type 2 diabetes mellitus without complication, without long-term current use of insulin (HCC)   Essential hypertension   History of CVA (cerebrovascular accident)   Obesity (BMI 30-39.9)   Aphasia   Dyslipidemia   DNR (do not resuscitate)   Right femoral neck fracture status post right total hip arthroplasty 02/02/2022 Status post mechanical fall with right hip fracture.  Continue supportive care including muscle relaxant, analgesics.  We will try to avoid sedative-hypnotics if possible because of underlying anxiety restlessness and behavioral issues.  Physical therapy on board and at this time patient is likely to be a candidate for skilled nursing  facility placement rather than CIR.  Continue aspirin Plavix.  Orthopedics the patient after surgical intervention and recommended weightbearing as tolerated with walker with aspirin and Plavix.  Significant leukocytosis/mild fever, possible acute possible acute metabolic encephalopathy.  CBC CBC trending down.  WBC today at 18.7 from 23.7.  Patient temporarily required oxygen supplementation.  X-ray of the chest repeated yesterday showed possible atelectasis.  Patient is unable to do incentive spirometry or deep breathing exercises.  Urinalysis and culture on admission without signs of infection.  COVID and influenza was negative.  Added aspiration precautions.  Recent CVA with persistent aphasia, agitation, anxiety Continue aspirin and Plavix.  Patient's family is having a difficult time at home trying to take care of her needs as well.  Plan is for skilled nursing facility placement at this time.  Mood disorder, behavioral issues, anxiety, possible acute metabolic encephalopathy. -Continue fluoxetine hydroxyzine Klonopin.  Patient's daughter is concerned about it.  We will discontinue muscle relaxant.  Patient was on Klonopin at home at night will change to nighttime.  Patient is on morphine and oxycodone for pain.  We will try to minimize the dose.  We will address constipation and urinary retention.  Nursing staff reported that patient did not urinate in the nighttime so I have asked for in and out cath.  Monitor patient overnight.  Essential HTN -Continue Norvasc, losartan.  Blood pressure seems to be stable at this time.   Hyperlipidemia  Intolerant to statins.  On fenofibrate.  Diabetes mellitus type 2 Recent hemoglobin A1c was 6.3, continue sliding scale insulin.  Hold metformin for now   obesity -Body mass index is  37.44 kg/m. Would benefit from weight loss as outpatient.     DVT prophylaxis: SCDs Start: 02/02/22 1640   Code Status:     Code Status: DNR  Disposition: skilled  nursing facility   Status is: Inpatient  Remains inpatient appropriate because: Status post hip surgery, confusion and agitation, need for rehabilitation    Family Communication:  Spoke with the patient's daughter at length on 02/04/2022.  Consultants:  Orthopedics  Procedures:  Right total hip arthroplasty on 02/02/2022  Antimicrobials:  Preoperative cefazolin  Anti-infectives (From admission, onward)    Start     Dose/Rate Route Frequency Ordered Stop   02/02/22 1700  ceFAZolin (ANCEF) IVPB 2g/100 mL premix        2 g 200 mL/hr over 30 Minutes Intravenous Every 6 hours 02/02/22 1639 02/03/22 0018   02/02/22 1645  fluconazole (DIFLUCAN) tablet 150 mg        150 mg Oral Daily 02/02/22 1639 02/04/22 0902   02/02/22 1100  ceFAZolin (ANCEF) IVPB 2g/100 mL premix        2 g 200 mL/hr over 30 Minutes Intravenous On call to O.R. 02/02/22 0419 02/02/22 1242      Subjective: Today, patient was seen and examined at bedside.  Poor historian.  Appears anxious and agitated at times.  Had low-grade fever yesterday.  Temperature max of 100.5 F yesterday evening.  Objective: Vitals:   02/03/22 1746 02/03/22 2128 02/04/22 0409 02/04/22 0820  BP:  (!) 111/59 (!) 97/55 (!) 114/58  Pulse:  (!) 120 (!) 107 (!) 107  Resp:  17 16 17   Temp: 99.3 F (37.4 C) 97.6 F (36.4 C) 97.7 F (36.5 C) 98 F (36.7 C)  TempSrc: Tympanic   Oral  SpO2:  (!) 89% 97% 95%  Weight:      Height:        Intake/Output Summary (Last 24 hours) at 02/04/2022 1424 Last data filed at 02/04/2022 1034 Gross per 24 hour  Intake 200 ml  Output 450 ml  Net -250 ml   Filed Weights   02/01/22 0628  Weight: 102.1 kg    Physical Examination:  General: Obese built, not in obvious distress appears anxious agitated, repeating the same words HENT:   No scleral pallor or icterus noted. Oral mucosa is moist.  Chest:  Clear breath sounds.  Diminished breath sounds bilaterally. No crackles or wheezes.  CVS: S1 &S2  heard. No murmur.  Regular rate and rhythm. Abdomen: Soft, nontender, nondistended.  Bowel sounds are heard.   Extremities: No cyanosis, clubbing or edema.  Peripheral pulses are palpable. Psych: Alert, awake and anxious, agitated aphasic CNS: Alert awake agitated and anxious, moving extremities Skin: Warm and dry.  No rashes noted.  Data Reviewed:   CBC: Recent Labs  Lab 02/01/22 0630 02/03/22 0136 02/04/22 0131  WBC 12.5* 23.7* 18.7*  NEUTROABS 10.3*  --   --   HGB 13.6 10.4* 8.7*  HCT 41.2 32.0* 26.1*  MCV 81.1 81.2 80.6  PLT 388 351 287    Basic Metabolic Panel: Recent Labs  Lab 02/01/22 0630 02/03/22 0136 02/04/22 0131  NA 135 135 132*  K 3.6 4.0 3.5  CL 101 104 102  CO2 22 25 22   GLUCOSE 147* 211* 191*  BUN 10 17 30*  CREATININE 0.60 0.83 1.35*  CALCIUM 9.4 9.0 8.6*  MG  --  1.9 1.9    Liver Function Tests: Recent Labs  Lab 02/01/22 0630  AST 18  ALT 13  ALKPHOS  80  BILITOT 1.0  PROT 7.1  ALBUMIN 3.7     Radiology Studies: DG Chest Port 1 View  Result Date: 02/03/2022 CLINICAL DATA:  Fever, shortness of breath, recent fall EXAM: PORTABLE CHEST 1 VIEW COMPARISON:  02/01/2022 FINDINGS: Transverse diameter of heart is slightly increased. Thoracic aorta is tortuous. There are no signs of pulmonary edema or focal consolidation. There is slight increase in interstitial markings in the lower lung fields. There is no pleural effusion or pneumothorax. IMPRESSION: Cardiomegaly. There is subtle interval increase in markings in the lower lung fields. This may suggest crowding of markings due to poor inspiration or subsegmental atelectasis or contusion. Electronically Signed   By: Ernie Avena M.D.   On: 02/03/2022 16:45   Pelvis Portable  Result Date: 02/02/2022 CLINICAL DATA:  Post right hip replacement EXAM: PORTABLE PELVIS 1-2 VIEWS COMPARISON:  None Available. FINDINGS: Post right hip total arthroplasty with single cerclage wire. Postoperative changes  in the soft tissues. No evidence of complication. 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 Electronically Signed   By: Guadlupe Spanish M.D.   On: 02/02/2022 16:15   DG HIP UNILAT WITH PELVIS 2-3 VIEWS RIGHT  Result Date: 02/02/2022 CLINICAL DATA:  Right hip arthroplasty EXAM: DG HIP (WITH OR WITHOUT PELVIS) 2-3V RIGHT COMPARISON:  02/01/2022 FINDINGS: Three fluoroscopic images are obtained during the performance of the procedure and are provided for interpretation only. Images demonstrate right total hip arthroplasty, in near anatomic alignment. No perihardware lucency or fracture. Fluoroscopy time: 19 seconds 2.28 mGy IMPRESSION: Right total hip arthroplasty. Electronically Signed   By: Wiliam Ke M.D.   On: 02/02/2022 15:15   DG C-Arm 1-60 Min-No Report  Result Date: 02/02/2022 Fluoroscopy was utilized by the requesting physician.  No radiographic interpretation.   DG C-Arm 1-60 Min-No Report  Result Date: 02/02/2022 Fluoroscopy was utilized by the requesting physician.  No radiographic interpretation.      LOS: 3 days    Joycelyn Das, MD [facility] Available via Epic secure chat 7am-7pm After these hours, please refer to coverage provider listed on [email or web address] 02/04/2022, 2:24 PM

## 2022-02-04 NOTE — TOC Initial Note (Signed)
Transition of Care [facility]) - Initial/Assessment Note    Patient Details  Name: Rebecca Barnes MRN: [id number] Date of Birth: 1945/11/03  Transition [facility] CM/SW Contact:    Lorri Frederick, LCSW Phone Number: 02/04/2022, 3:56 PM  Clinical Narrative:     Pt recommended for CIR, unclear if this will tale place.  Pt disoriented x4, CSW spoke with daughter Rebecca Barnes about SNF back up plan.  CIR is her first choice for DC plan. She is agreeable, pt was at [facility] in the past, so so experience, daughter would like other options if SNF does become the plan.  Pt lives with daughter, daughter's husband and family.  They have had HH aides in place but not currently.  Pt is vaccinated for covid but not boosted.  Sent out for SNF.  Will need new passr, as old one has expired.              Expected Discharge Plan: IP Rehab Facility Barriers to Discharge: Continued Medical Work up   Patient Goals and CMS Choice     Choice offered to / list presented to : Adult Children (daughter Rebecca Barnes)  Expected Discharge Plan and Services Expected Discharge Plan: IP Rehab Facility In-house Referral: Clinical Social Work   Post Acute Care Choice: IP Rehab Living arrangements for the past 2 months: Single Family Home                                      Prior Living Arrangements/Services Living arrangements for the past 2 months: Single Family Home Lives with:: Adult Children (with daughter and her husband,  family) Patient language and need for interpreter reviewed:: No        Need for Family Participation in Patient Care: No (Comment) Care giver support system in place?: Yes (comment) Current home services: Other (comment) (none, has had private duty aide recently) Criminal Activity/Legal Involvement Pertinent to Current Situation/Hospitalization: No - Comment as needed  Activities of Daily Living      Permission Sought/Granted                  Emotional  Assessment Appearance:: Appears stated age Attitude/Demeanor/Rapport: Unable to Assess Affect (typically observed): Unable to Assess Orientation: :  (not oriented)      Admission diagnosis:  Hip fracture (HCC) [S72.009A] Fall, initial encounter [W19.XXXA] Closed displaced fracture of right femoral neck (HCC) [S72.001A] Patient Active Problem List   Diagnosis Date Noted   Fever 02/04/2022   Acute metabolic encephalopathy 02/04/2022   Hip fracture (HCC) 02/01/2022   DNR (do not resuscitate) 02/01/2022   Acute cerebral infarction (HCC) 12/06/2021   Aphasia    Dyslipidemia    Expressive aphasia 12/03/2021   Obesity (BMI 30-39.9) 12/03/2021   History of marijuana use 12/03/2021   Stroke-like symptoms 12/02/2021   Candidiasis of female genitalia 03/20/2021   History of CVA (cerebrovascular accident) 03/20/2021   Hyponatremia 03/14/2021   Hypokalemia 03/14/2021   Thrombocytosis 03/14/2021   Polycythemia 03/14/2021   Late effect of CVA (RLE weakness) 03/14/2021   Essential hypertension 09/18/2019   Anxiety with depression 06/07/2019   Type 2 diabetes mellitus without complication, without long-term current use of insulin (HCC) 06/07/2019   Disturbance of skin sensation 06/07/2019   Leukocytosis 06/07/2019   Secondary DM with peripheral vascular disease (HCC) 06/07/2019   Generalized weakness 06/07/2019   ANXIETY 03/29/2007   PCP:  Ngetich, Dinah C,  NP Pharmacy:   [id number] [place], [place] - [address] AT [facility] [address] [facility] [address] [place] [address] Phone: [phone] Fax: [phone]     Social Determinants of Health (SDOH) Interventions    Readmission Risk Interventions     No data to display

## 2022-02-04 NOTE — Progress Notes (Signed)
Physical Therapy Treatment Patient Details Name: Rebecca Barnes MRN: [id number] DOB: 1945/09/14 Today's Date: 02/04/2022   History of Present Illness Rebecca Barnes is a 76 y.o. female admitted 6/12 after fall and found to have right femoral neck fracture.  Pt underwent right THA 6/13.  PMH:   anxiety; hypertension, diabetes mellitus, CVA-Of note, patient was recently admitted from 4/12-16 with an acute L MCA CVA and was discharged to [facility] for rehab prior to return home.    PT Comments    Pt admitted with above diagnosis. Pt very restless and trying to get OOB with nurse in room.  PT came in and assisted pt to 3N1. Pt needs min assist and +2 for safety as her impulsivity decreases her safety considerably.  Pt needed time on the 3N1 therefore left pt with nurse on 3N1 and will return to do more if able later in day.   Pt currently with functional limitations due to the deficits listed below (see PT Problem List). Pt will benefit from skilled PT to increase their independence and safety with mobility to allow discharge to the venue listed below.      Recommendations for follow up therapy are one component of a multi-disciplinary discharge planning process, led by the attending physician.  Recommendations may be updated based on patient status, additional functional criteria and insurance authorization.  Follow Up Recommendations  Acute inpatient rehab (3hours/day)     Assistance Recommended at Discharge Intermittent Supervision/Assistance  Patient can return home with the following A lot of help with walking and/or transfers;A little help with bathing/dressing/bathroom;Assistance with cooking/housework;Assist for transportation;Help with stairs or ramp for entrance   Equipment Recommendations  None recommended by PT    Recommendations for Other Services Rehab consult     Precautions / Restrictions Precautions Precautions: Fall Precaution Comments: Clarified with PA that pt does not have hip  precautions Restrictions Weight Bearing Restrictions: Yes RLE Weight Bearing: Weight bearing as tolerated     Mobility  Bed Mobility Overal bed mobility: Needs Assistance Bed Mobility: Supine to Sit, Sit to Supine     Supine to sit: Min assist, +2 for safety/equipment Sit to supine: Min assist   General bed mobility comments: Was walking byt room and noted that pt was yelling and trying to get OOB with LEs hanging off on arm rail.  Nurse was in room. Asked nurse if this PT could help her get pt to EOB to see if pt would calm down.  Required assist with pt pulling up on PT hand but pt was able to move her LEs to the EOB and a little help bringing them off.    Transfers Overall transfer level: Needs assistance Equipment used: Rolling walker (2 wheels) Transfers: Sit to/from Stand, Bed to chair/wheelchair/BSC Sit to Stand: Min assist, +2 safety/equipment, From elevated surface Stand pivot transfers: Min assist, +2 safety/equipment         General transfer comment: Pt was able to stand to RW with pt initiating movement prior to PT being ready for pt to move. Pt did need min assist to rise but was able to pivot with RW to 3N1 as pad was soiled with BM. Pt maintained flexed posture throughout.  Pt moving alot on the 3n1 and impulsive needing constant supervision.  Nurse was in room and was planning to stay with pt until she finished BM.    Ambulation/Gait                   Stairs  Wheelchair Mobility    Modified Rankin (Stroke Patients Only)       Balance Overall balance assessment: Needs assistance Sitting-balance support: No upper extremity supported, Feet supported Sitting balance-Leahy Scale: Fair     Standing balance support: Bilateral upper extremity supported, During functional activity Standing balance-Leahy Scale: Poor Standing balance comment: relies on UE support and external support                            Cognition  Arousal/Alertness: Awake/alert Behavior During Therapy: Restless, Anxious, Impulsive Overall Cognitive Status: Difficult to assess Area of Impairment: Awareness, Problem solving, Following commands                       Following Commands: Follows one step commands with increased time     Problem Solving: Difficulty sequencing, Requires verbal cues, Requires tactile cues General Comments: Pt impulsively trying to get up on arrival.  Pt impulsive with standing as well.  Pt appears confused and anxious but difficult to assess due to aphasia.  Pt actively having BM and nurse feels she is constipated and this is upsetting her.  She has been yelling out for the last hour prior to treatment.        Exercises      General Comments        Pertinent Vitals/Pain Pain Assessment Faces Pain Scale: Hurts even more Breathing: normal Negative Vocalization: occasional moan/groan, low speech, negative/disapproving quality Facial Expression: sad, frightened, frown Body Language: tense, distressed pacing, fidgeting Consolability: unable to console, distract or reassure PAINAD Score: 5 Pain Location: right hip Pain Descriptors / Indicators: Grimacing, Guarding, Discomfort Pain Intervention(s): Limited activity within patient's tolerance, Monitored during session, Repositioned    Home Living                          Prior Function            PT Goals (current goals can now be found in the care plan section) Acute Rehab PT Goals Patient Stated Goal: Pt unable to state Progress towards PT goals: Progressing toward goals    Frequency    Min 5X/week      PT Plan Current plan remains appropriate    Co-evaluation              AM-PAC PT "6 Clicks" Mobility   Outcome Measure  Help needed turning from your back to your side while in a flat bed without using bedrails?: A Little Help needed moving from lying on your back to sitting on the side of a flat bed  without using bedrails?: A Little Help needed moving to and from a bed to a chair (including a wheelchair)?: Total Help needed standing up from a chair using your arms (e.g., wheelchair or bedside chair)?: Total Help needed to walk in hospital room?: Total Help needed climbing 3-5 steps with a railing? : Total 6 Click Score: 10    End of Session Equipment Utilized During Treatment: Gait belt Activity Tolerance: Patient limited by fatigue Patient left: with call bell/phone within reach (on 3N1 with nurse in room) Nurse Communication: Mobility status (Pt on 3n1 with nurse in room) PT Visit Diagnosis: Unsteadiness on feet (R26.81);Muscle weakness (generalized) (M62.81);Pain Pain - Right/Left: Right Pain - part of body: Leg     Time: 1005-1015 PT Time Calculation (min) (ACUTE ONLY): 10 min  Charges:  $Therapeutic Activity: 8-22  mins                     [facility] M,PT Acute Rehab Services [phone]    Bevelyn Buckles 02/04/2022, 11:52 AM

## 2022-02-04 NOTE — Progress Notes (Signed)
Inpatient Rehabilitation Admissions Coordinator   I met with patient at bedside with sitter and she is very restless, agitated and seemingly distressed. I spoke with Daughter, [name], by phone. She had appointment end of June to follow up on some recent behavioral , anxiety and depression concerns. Currently patient not at a level to participate at [facility] level rehab and [name] is aware. [name] would like to speak to her physician to express her concerns to request further medical workup of her behavioral concerns. I will follow her progress.  Danne Baxter, RN, MSN Rehab Admissions Coordinator [phone] 02/04/2022 12:14 PM

## 2022-02-04 NOTE — Care Management Important Message (Signed)
Important Message  Patient Details  Name: Rebecca Barnes MRN: [id number] Date of Birth: [birth year]   Medicare Important Message Given:  Yes     Waqas Bruhl Stefan Church 02/04/2022, 2:10 PM

## 2022-02-04 NOTE — Inpatient Diabetes Management (Signed)
Inpatient Diabetes Program Recommendations  AACE/ADA: New Consensus Statement on Inpatient Glycemic Control   Target Ranges:  Prepandial:   less than 140 mg/dL      Peak postprandial:   less than 180 mg/dL (1-2 hours)      Critically ill patients:  140 - 180 mg/dL    Latest Reference Range & Units 02/03/22 07:35 02/03/22 11:12 02/03/22 15:16 02/03/22 21:24 02/04/22 08:25  Glucose-Capillary 70 - 99 mg/dL 235 (H) 573 (H) 220 (H) 194 (H) 230 (H)    Review of Glycemic Control  Diabetes history: DM2 Outpatient Diabetes medications: Metformin 500 mg QAM Current orders for Inpatient glycemic control: Novolog 0-15 units TID with meals, Novolog 0-5 units QHS  Inpatient Diabetes Program Recommendations:    Insulin: Please consider ordering Novolog 2 units TID with meals for meal coverage if patient eats at least 50% of meals.  Thanks, Orlando Penner, RN, MSN, CDCES Diabetes Coordinator Inpatient Diabetes Program [phone] (Team Pager from 8am to 5pm)

## 2022-02-04 NOTE — Progress Notes (Signed)
   02/04/22 0100  Urine Characteristics  Urinary Interventions Bladder scan  Bladder Scan Volume (mL) 220 mL   Bladder scan completed. Informed Chinita Greenland, NP of results. An order for I&O cath prn was placed.

## 2022-02-04 NOTE — Progress Notes (Addendum)
Physical Therapy Treatment Patient Details Name: Rebecca Barnes MRN: [id number] DOB: [birth year] Today's Date: 02/04/2022   History of Present Illness Rebecca Barnes is a 76 y.o. female admitted 6/12 after fall and found to have right femoral neck fracture.  Pt underwent right THA 6/13.  PMH:   anxiety; hypertension, diabetes mellitus, CVA-Of note, patient was recently admitted from 4/12-16 with an acute L MCA CVA and was discharged to [facility] for rehab prior to return home.    PT Comments    Pt's session limited by pt agitation, confusion, and inability to follow commands. Pt required significant assistance x 2 today for her safety during bed mobility and transfers but unable to initiate gait with RW. POC frequency and recommendations updated below.  Recommendations for follow up therapy are one component of a multi-disciplinary discharge planning process, led by the attending physician.  Recommendations may be updated based on patient status, additional functional criteria and insurance authorization.  Follow Up Recommendations  Skilled nursing-short term rehab (<3 hours/day)     Assistance Recommended at Discharge Frequent or constant Supervision/Assistance  Patient can return home with the following A lot of help with walking and/or transfers;Assistance with cooking/housework;Assist for transportation;Help with stairs or ramp for entrance;A lot of help with bathing/dressing/bathroom   Equipment Recommendations  None recommended by PT       Precautions / Restrictions Precautions Precautions: Fall Precaution Comments: Clarified with PA that pt does not have hip precautions Restrictions Weight Bearing Restrictions: Yes RLE Weight Bearing: Weight bearing as tolerated     Mobility  Bed Mobility Overal bed mobility: Needs Assistance Bed Mobility: Supine to Sit, Sit to Supine     Supine to sit: +2 for safety/equipment, Max assist, +2 for physical assistance Sit to supine: Max assist,  +2 for physical assistance, +2 for safety/equipment   General bed mobility comments: Pt with limited ability to effectively perform bed mobility 2/2 confusion, agitation, and weakness.    Transfers Overall transfer level: Needs assistance Equipment used: Rolling walker (2 wheels) Transfers: Sit to/from Stand, Bed to chair/wheelchair/BSC Sit to Stand: +2 safety/equipment, Mod assist, +2 physical assistance           General transfer comment: Pt stood with RW briefly but due to impulsive behavior and not following instructions, did not go away from bed. Pt letting go of RW on left side. Pt returned to sitting EOB thereafter.    Ambulation/Gait               General Gait Details: unable to assess/treat   Stairs             Wheelchair Mobility    Modified Rankin (Stroke Patients Only)       Balance Overall balance assessment: Needs assistance Sitting-balance support: No upper extremity supported, Feet supported Sitting balance-Leahy Scale: Fair     Standing balance support: Bilateral upper extremity supported, During functional activity Standing balance-Leahy Scale: Poor Standing balance comment: relies on UE support and external support                            Cognition Arousal/Alertness: Awake/alert Behavior During Therapy: Restless, Anxious, Impulsive Overall Cognitive Status: Difficult to assess Area of Impairment: Awareness, Problem solving, Following commands, Safety/judgement                       Following Commands: Follows one step commands inconsistently Safety/Judgement: Decreased awareness of safety, Decreased awareness of deficits  Problem Solving: Difficulty sequencing, Requires verbal cues, Requires tactile cues General Comments: Pt impulsively trying to get up on arrival.  Pt impulsive with standing as well and unable to properly utilize walker or follow commands. Pt appears confused and anxious but difficult to  assess due to aphasia despite frequent questioning including use of yes/no answers.        Exercises      General Comments        Pertinent Vitals/Pain Pain Assessment Pain Assessment:  (unable to quantify) Breathing: occasional labored breathing, short period of hyperventilation Negative Vocalization: repeated troubled calling out, loud moaning/groaning, crying Facial Expression: sad, frightened, frown Body Language: rigid, fists clenched, knees up, pushing/pulling away, strikes out Consolability: unable to console, distract or reassure PAINAD Score: 8 Pain Location: right hip Pain Descriptors / Indicators: Grimacing, Guarding, Discomfort    Home Living                          Prior Function            PT Goals (current goals can now be found in the care plan section) Acute Rehab PT Goals Patient Stated Goal: Pt unable to state Progress towards PT goals: Not progressing toward goals - comment (see above)    Frequency    Min 3X/week      PT Plan Frequency needs to be updated;Discharge plan needs to be updated    Co-evaluation              AM-PAC PT "6 Clicks" Mobility   Outcome Measure  Help needed turning from your back to your side while in a flat bed without using bedrails?: A Little Help needed moving from lying on your back to sitting on the side of a flat bed without using bedrails?: Total Help needed moving to and from a bed to a chair (including a wheelchair)?: Total Help needed standing up from a chair using your arms (e.g., wheelchair or bedside chair)?: Total Help needed to walk in hospital room?: Total Help needed climbing 3-5 steps with a railing? : Total 6 Click Score: 8    End of Session Equipment Utilized During Treatment: Gait belt Activity Tolerance: Patient limited by fatigue;Patient limited by pain;Treatment limited secondary to agitation Patient left: in bed;with bed alarm set;with nursing/sitter in room (on 3N1 with  nurse in room) Nurse Communication: Mobility status;Precautions (Pt on 3n1 with nurse in room) PT Visit Diagnosis: Unsteadiness on feet (R26.81);Muscle weakness (generalized) (M62.81);Pain Pain - Right/Left: Right Pain - part of body: Leg     Time: 1200-1209 PT Time Calculation (min) (ACUTE ONLY): 9 min  Charges:  $Therapeutic Activity: 8-22 mins                     Tana Coast, PT    [employer] 02/04/2022, 1:12 PM

## 2022-02-04 NOTE — NC FL2 (Signed)
[place] MEDICAID FL2 LEVEL OF CARE SCREENING TOOL     IDENTIFICATION  Patient Name: Rebecca Barnes Birthdate: Jan 28, 1946 Sex: female Admission Date (Current Location): 02/01/2022  [facility] and [place] Number:  Producer, television/film/video and Address:  [facility], [address] [place], [place] [address]      Provider Number: [id number]  Attending Physician Name and Address:  Joycelyn Das, MD  Relative Name and Phone Number:  MAIDA, WIDGER Daughter [phone]    Current Level of Care: Hospital Recommended Level of Care: Skilled Nursing Facility Prior Approval Number:    Date Approved/Denied:   PASRR Number:    Discharge Plan: SNF    Current Diagnoses: Patient Active Problem List   Diagnosis Date Noted   Fever 02/04/2022   Acute metabolic encephalopathy 02/04/2022   Hip fracture (HCC) 02/01/2022   DNR (do not resuscitate) 02/01/2022   Acute cerebral infarction (HCC) 12/06/2021   Aphasia    Dyslipidemia    Expressive aphasia 12/03/2021   Obesity (BMI 30-39.9) 12/03/2021   History of marijuana use 12/03/2021   Stroke-like symptoms 12/02/2021   Candidiasis of female genitalia 03/20/2021   History of CVA (cerebrovascular accident) 03/20/2021   Hyponatremia 03/14/2021   Hypokalemia 03/14/2021   Thrombocytosis 03/14/2021   Polycythemia 03/14/2021   Late effect of CVA (RLE weakness) 03/14/2021   Essential hypertension 09/18/2019   Anxiety with depression 06/07/2019   Type 2 diabetes mellitus without complication, without long-term current use of insulin (HCC) 06/07/2019   Disturbance of skin sensation 06/07/2019   Leukocytosis 06/07/2019   Secondary DM with peripheral vascular disease (HCC) 06/07/2019   Generalized weakness 06/07/2019   ANXIETY 03/29/2007    Orientation RESPIRATION BLADDER Height & Weight      (not oriented currently)  O2 Incontinent Weight: 225 lb (102.1 kg) Height:  5\' 5"  (165.1 cm)  BEHAVIORAL SYMPTOMS/MOOD  NEUROLOGICAL BOWEL NUTRITION STATUS      Continent Diet (see discharge summary)  AMBULATORY STATUS COMMUNICATION OF NEEDS Skin   Total Care Verbally Surgical wounds                       Personal Care Assistance Level of Assistance  Bathing, Feeding, Dressing Bathing Assistance: Maximum assistance Feeding assistance: Limited assistance Dressing Assistance: Maximum assistance     Functional Limitations Info  Sight, Hearing, Speech Sight Info: Adequate Hearing Info: Adequate Speech Info: Adequate    SPECIAL CARE FACTORS FREQUENCY  PT (By licensed PT), OT (By licensed OT)     PT Frequency: 5x week OT Frequency: 5x week            Contractures Contractures Info: Not present    Additional Factors Info  Code Status, Allergies Code Status Info: DNR Allergies Info: Januvia (Sitagliptin), Statins, Sulfa Antibiotics           Current Medications (02/04/2022):  This is the current hospital active medication list Current Facility-Administered Medications  Medication Dose Route Frequency Provider Last Rate Last Admin   acetaminophen (TYLENOL) tablet 650 mg  650 mg Oral Q6H PRN Pokhrel, Laxman, MD   650 mg at 02/04/22 0620   amLODipine (NORVASC) tablet 5 mg  5 mg Oral Daily Swinteck, 02/06/22, MD   5 mg at 02/04/22 1108   aspirin EC tablet 81 mg  81 mg Oral Daily 02/06/22, MD   81 mg at 02/04/22 0850   bisacodyl (DULCOLAX) EC tablet 5 mg  5 mg Oral Daily PRN 02/06/22, MD  Chlorhexidine Gluconate Cloth 2 % PADS 6 each  6 each Topical Q0600 Pokhrel, Laxman, MD   6 each at 02/04/22 0625   [START ON 02/05/2022] clonazePAM (KLONOPIN) tablet 0.5 mg  0.5 mg Oral QHS Pokhrel, Laxman, MD       clopidogrel (PLAVIX) tablet 75 mg  75 mg Oral Daily Pham, Minh Q, RPH-CPP   75 mg at 02/04/22 0850   docusate sodium (COLACE) capsule 100 mg  100 mg Oral BID Samson Frederic, MD   100 mg at 02/04/22 0850   ezetimibe (ZETIA) tablet 10 mg  10 mg Oral Daily Swinteck, Arlys John, MD   10  mg at 02/04/22 0850   feeding supplement (ENSURE ENLIVE / ENSURE PLUS) liquid 237 mL  237 mL Oral BID BM Swinteck, Arlys John, MD   237 mL at 02/04/22 1450   fenofibrate tablet 54 mg  54 mg Oral Daily Samson Frederic, MD   54 mg at 02/04/22 0902   FLUoxetine (PROZAC) capsule 40 mg  40 mg Oral Daily Samson Frederic, MD   40 mg at 02/04/22 0850   hydrOXYzine (ATARAX) tablet 10 mg  10 mg Oral TID PRN Samson Frederic, MD   10 mg at 02/04/22 6720   insulin aspart (novoLOG) injection 0-15 Units  0-15 Units Subcutaneous TID WC Samson Frederic, MD   3 Units at 02/04/22 1126   insulin aspart (novoLOG) injection 0-5 Units  0-5 Units Subcutaneous QHS Samson Frederic, MD   2 Units at 02/02/22 2236   MEDLINE mouth rinse  15 mL Mouth Rinse BID Pokhrel, Laxman, MD   15 mL at 02/04/22 1145   melatonin tablet 10 mg  10 mg Oral QHS Swinteck, Arlys John, MD   10 mg at 02/03/22 2101   menthol-cetylpyridinium (CEPACOL) lozenge 3 mg  1 lozenge Oral PRN Swinteck, Arlys John, MD       Or   phenol (CHLORASEPTIC) mouth spray 1 spray  1 spray Mouth/Throat PRN Swinteck, Arlys John, MD       metoCLOPramide (REGLAN) injection 5-10 mg  5-10 mg Intravenous Q8H PRN Swinteck, Arlys John, MD       morphine (PF) 2 MG/ML injection 2 mg  2 mg Intravenous Q4H PRN Pokhrel, Laxman, MD       mupirocin ointment (BACTROBAN) 2 % 1 application   1 application  Nasal BID Pokhrel, Laxman, MD   1 application  at 02/04/22 1108   nystatin (MYCOSTATIN/NYSTOP) topical powder   Topical BID Samson Frederic, MD   Given at 02/04/22 1108   ondansetron (ZOFRAN) tablet 4 mg  4 mg Oral Q6H PRN Swinteck, Arlys John, MD       Or   ondansetron (ZOFRAN) injection 4 mg  4 mg Intravenous Q6H PRN Swinteck, Arlys John, MD       oxyCODONE (Oxy IR/ROXICODONE) immediate release tablet 5 mg  5 mg Oral Q4H PRN Pokhrel, Laxman, MD       polyethylene glycol (MIRALAX / GLYCOLAX) packet 17 g  17 g Oral Daily Pokhrel, Laxman, MD       sodium chloride tablet 1 g  1 g Oral TID WC Samson Frederic, MD   1 g at  02/04/22 1109     Discharge Medications: Please see discharge summary for a list of discharge medications.  Relevant Imaging Results:  Relevant Lab Results:   Additional Information SSN: [id number] Covid vaccinated, not boosted.  Lorri Frederick, LCSW

## 2022-02-04 NOTE — Care Management Important Message (Signed)
Important Message  Patient Details  Name: Rebecca Barnes MRN: [id number] Date of Birth: Sep 26, 1945   Medicare Important Message Given:  Yes     Adriann Thau Stefan [place] 02/04/2022, 2:15 PM

## 2022-02-05 DIAGNOSIS — Z66 Do not resuscitate: Secondary | ICD-10-CM | POA: Diagnosis not present

## 2022-02-05 DIAGNOSIS — F418 Other specified anxiety disorders: Secondary | ICD-10-CM | POA: Diagnosis not present

## 2022-02-05 DIAGNOSIS — R4701 Aphasia: Secondary | ICD-10-CM | POA: Diagnosis not present

## 2022-02-05 DIAGNOSIS — S72001A Fracture of unspecified part of neck of right femur, initial encounter for closed fracture: Secondary | ICD-10-CM | POA: Diagnosis not present

## 2022-02-05 LAB — BASIC METABOLIC PANEL
Anion gap: 10 (ref 5–15)
BUN: 39 mg/dL — ABNORMAL HIGH (ref 8–23)
CO2: 23 mmol/L (ref 22–32)
Calcium: 9.2 mg/dL (ref 8.9–10.3)
Chloride: 104 mmol/L (ref 98–111)
Creatinine, Ser: 0.94 mg/dL (ref 0.44–1.00)
GFR, Estimated: >60 mL/min (ref >60)
Glucose, Bld: 157 mg/dL — ABNORMAL HIGH (ref 70–99)
Potassium: 3.5 mmol/L (ref 3.5–5.1)
Sodium: 137 mmol/L (ref 135–145)

## 2022-02-05 LAB — GLUCOSE, CAPILLARY
Glucose-Capillary: 161 mg/dL — ABNORMAL HIGH (ref 70–99)
Glucose-Capillary: 182 mg/dL — ABNORMAL HIGH (ref 70–99)
Glucose-Capillary: 186 mg/dL — ABNORMAL HIGH (ref 70–99)
Glucose-Capillary: 125 mg/dL — ABNORMAL HIGH (ref 70–99)

## 2022-02-05 LAB — CBC
HCT: 26.1 % — ABNORMAL LOW (ref 36.0–46.0)
Hemoglobin: 8.7 g/dL — ABNORMAL LOW (ref 12.0–15.0)
MCH: 27 pg (ref 26.0–34.0)
MCHC: 33.3 g/dL (ref 30.0–36.0)
MCV: 81.1 fL (ref 80.0–100.0)
Platelets: 323 10*3/uL (ref 150–400)
RBC: 3.22 MIL/uL — ABNORMAL LOW (ref 3.87–5.11)
RDW: 14.4 % (ref 11.5–15.5)
WBC: 18.2 10*3/uL — ABNORMAL HIGH (ref 4.0–10.5)
nRBC: 0 % (ref 0.0–0.2)

## 2022-02-05 LAB — MAGNESIUM: Magnesium: 2.2 mg/dL (ref 1.7–2.4)

## 2022-02-05 LAB — PROCALCITONIN: Procalcitonin: 0.3 ng/mL

## 2022-02-05 MED ORDER — OXYCODONE HCL 5 MG PO TABS: 5.0000 mg | ORAL_TABLET | ORAL | 0 refills | Status: AC | PRN

## 2022-02-05 MED ORDER — HALOPERIDOL LACTATE 5 MG/ML IJ SOLN: 2.0000 mg | Freq: Four times a day (QID) | INTRAMUSCULAR | Status: DC | PRN

## 2022-02-05 MED ORDER — HALOPERIDOL LACTATE 5 MG/ML IJ SOLN
2.0000 mg | Freq: Four times a day (QID) | INTRAMUSCULAR | Status: DC | PRN
Start: 2022-02-05 — End: 2022-02-09
  Administered 2022-02-05 – 2022-02-08 (×6): 2 mg via INTRAVENOUS
  Filled 2022-02-05 (×6): qty 1

## 2022-02-05 MED ORDER — ASPIRIN 81 MG PO CHEW
81.0000 mg | CHEWABLE_TABLET | Freq: Two times a day (BID) | ORAL | 0 refills | Status: DC
Start: 2022-02-05 — End: 2022-02-15

## 2022-02-05 MED ORDER — ASPIRIN 81 MG PO CHEW
81.0000 mg | CHEWABLE_TABLET | Freq: Two times a day (BID) | ORAL | Status: DC
  Administered 2022-02-05 – 2022-02-09 (×9): 81 mg via ORAL
  Filled 2022-02-05 (×9): qty 1

## 2022-02-05 NOTE — Progress Notes (Signed)
PROGRESS NOTE    Rebecca Barnes  OEV:[id number] DOB: [birth year] DOA: 02/01/2022 PCP: Caesar Bookman, NP    Brief Narrative:  Rebecca Barnes is a 76 y.o. female with medical history significant of anxiety; hypertension, diabetes mellitus, CVA presented to hospital after sustaining a fall.  Of note, patient was recently admitted from 4/12-16 with an acute L MCA CVA and was discharged to [facility]; she remained in CIR from 4/16-23 and at the time of discharge required supervision for transfers due to R visual field deficit, aphasia, and apraxia.  With her aphasia, she is really not able to effectively provide history, mostly sounded like word salad.  Patient has been having difficulty communicating.  Has caregiver at home.  In the ED, patient was noted to have right femoral neck fracture. CT of the head was negative.  Orthopedics was consulted and underwent right total hip arthroplasty on 02/02/2022 and subsequently had increasing anxiety restlessness confusion and low-grade fever.  Chest x-ray showed some atelectasis.  Currently awaiting for CIR/skilled nursing facility placement.   Assessment and plan   Principal Problem:   Hip fracture (HCC) Active Problems:   Anxiety with depression   Type 2 diabetes mellitus without complication, without long-term current use of insulin (HCC)   Essential hypertension   History of CVA (cerebrovascular accident)   Obesity (BMI 30-39.9)   Aphasia   Dyslipidemia   DNR (do not resuscitate)   Right femoral neck fracture status post right total hip arthroplasty 02/02/2022 Status post mechanical fall with right hip fracture.    Physical therapy on board , CIR versus skilled nursing facility placement at this time.  Continue aspirin Plavix, weightbearing as tolerated  Significant leukocytosis/mild fever,  acute metabolic encephalopathy.  Improved today.  WBC today at 18.2 from 18.7 < 23.7.  X-ray with possible atelectasis.  Patient is unable to do incentive spirometry  or deep breathing exercises GI.  Urinalysis and culture on admission without signs of infection.  COVID and influenza was negative.  Added aspiration precautions.  Sedative-hypnotics, narcotic and muscle relaxants were adjusted yesterday.  Procalcitonin at 0.3.  Check CBC in AM.  Recent CVA with persistent aphasia, agitation, anxiety Continue aspirin and Plavix.  Plan is for skilled nursing facility placement or CIR at this time.  Mood disorder, behavioral issues Klonopin to be changed to nighttime only.  Continue Atarax and fluoxetine.    Urinary retention.  Required in and out cath.  We will continue to monitor closely.  Essential HTN -Continue Norvasc,.  Losartan on hold.   Hyperlipidemia  Intolerant to statins.  On fenofibrate.  Diabetes mellitus type 2 Recent hemoglobin A1c was 6.3, continue sliding scale insulin.  Hold metformin for now.  Latest POC glucose of 182.  obesity -Body mass index is 37.44 kg/m. Would benefit from weight loss as outpatient.     DVT prophylaxis: SCDs Start: 02/02/22 1640   Code Status:     Code Status: DNR  Disposition: skilled nursing facility/CIR likely in 1 to 2 days.  Status is: Inpatient  Remains inpatient appropriate because: Status post hip surgery,  need for rehabilitation leukocytosis   Family Communication:  Spoke with the patient's daughter on 02/04/2022.  Consultants:  Orthopedics  Procedures:  Right total hip arthroplasty on 02/02/2022  Antimicrobials:  None currently  Anti-infectives (From admission, onward)    Start     Dose/Rate Route Frequency Ordered Stop   02/02/22 1700  ceFAZolin (ANCEF) IVPB 2g/100 mL premix        2  g 200 mL/hr over 30 Minutes Intravenous Every 6 hours 02/02/22 1639 02/03/22 0018   02/02/22 1645  fluconazole (DIFLUCAN) tablet 150 mg        150 mg Oral Daily 02/02/22 1639 02/04/22 0902   02/02/22 1100  ceFAZolin (ANCEF) IVPB 2g/100 mL premix        2 g 200 mL/hr over 30 Minutes Intravenous On  call to O.R. 02/02/22 0419 02/02/22 1242      Subjective: Today, patient was seen and examined at bedside.  Appears to be more calm and comfortable today in stating thank you.  Seems to understand some with aphasia.  Able to follow commands including squeezing hands  Objective: Vitals:   02/04/22 2010 02/04/22 2200 02/05/22 0746 02/05/22 1129  BP: 109/61 (!) 112/59 (!) 117/54 122/66  Pulse: (!) 110 (!) 103 96 (!) 112  Resp: 18 18 20 20   Temp: 98.6 F (37 C) 98 F (36.7 C) 98.4 F (36.9 C) 99.3 F (37.4 C)  TempSrc: Oral Oral Oral Axillary  SpO2: 94% 98% 96% 97%  Weight:      Height:        Intake/Output Summary (Last 24 hours) at 02/05/2022 1157 Last data filed at 02/05/2022 0845 Gross per 24 hour  Intake 380 ml  Output 500 ml  Net -120 ml   Filed Weights   02/01/22 0628  Weight: 102.1 kg  Body mass index is 37.44 kg/m.   Physical Examination:  General: Obese built, not in obvious distress, mildly anxious, more alert awake and more Communicative HENT:   No scleral pallor or icterus noted. Oral mucosa is moist.  Chest:  Clear breath sounds.  Diminished breath sounds bilaterally. No crackles or wheezes.  CVS: S1 &S2 heard. No murmur.  Regular rate and rhythm. Abdomen: Soft, nontender, nondistended.  Bowel sounds are heard.   Extremities: No cyanosis, clubbing or edema.  Peripheral pulses are palpable. Psych: Alert, awake and has underlying aphasia but trying to communicate better than yesterday, mildly anxious CNS: Expressive aphasia, anxious, moves all extremities, Skin: Warm and dry.  No rashes noted.  Data Reviewed:   CBC: Recent Labs  Lab 02/01/22 0630 02/03/22 0136 02/04/22 0131 02/05/22 0201  WBC 12.5* 23.7* 18.7* 18.2*  NEUTROABS 10.3*  --   --   --   HGB 13.6 10.4* 8.7* 8.7*  HCT 41.2 32.0* 26.1* 26.1*  MCV 81.1 81.2 80.6 81.1  PLT 388 351 287 323    Basic Metabolic Panel: Recent Labs  Lab 02/01/22 0630 02/03/22 0136 02/04/22 0131  02/05/22 0201  NA 135 135 132* 137  K 3.6 4.0 3.5 3.5  CL 101 104 102 104  CO2 22 25 22 23   GLUCOSE 147* 211* 191* 157*  BUN 10 17 30* 39*  CREATININE 0.60 0.83 1.35* 0.94  CALCIUM 9.4 9.0 8.6* 9.2  MG  --  1.9 1.9 2.2    Liver Function Tests: Recent Labs  Lab 02/01/22 0630  AST 18  ALT 13  ALKPHOS 80  BILITOT 1.0  PROT 7.1  ALBUMIN 3.7     Radiology Studies: DG Chest Port 1 View  Result Date: 02/03/2022 CLINICAL DATA:  Fever, shortness of breath, recent fall EXAM: PORTABLE CHEST 1 VIEW COMPARISON:  02/01/2022 FINDINGS: Transverse diameter of heart is slightly increased. Thoracic aorta is tortuous. There are no signs of pulmonary edema or focal consolidation. There is slight increase in interstitial markings in the lower lung fields. There is no pleural effusion or pneumothorax. IMPRESSION: Cardiomegaly. There  is subtle interval increase in markings in the lower lung fields. This may suggest crowding of markings due to poor inspiration or subsegmental atelectasis or contusion. Electronically Signed   By: Ernie Avena M.D.   On: 02/03/2022 16:45      LOS: 4 days    Joycelyn Das, MD [facility] Available via Epic secure chat 7am-7pm After these hours, please refer to coverage provider listed on [email or web address] 02/05/2022, 11:57 AM

## 2022-02-05 NOTE — Plan of Care (Signed)
  Problem: Education: Goal: Ability to describe self-care measures that may prevent or decrease complications (Diabetes Survival Skills Education) will improve 02/05/2022 0816 by Debbrah Alar, RN Outcome: Not Progressing 02/05/2022 0816 by Debbrah Alar, RN Outcome: Not Progressing Goal: Individualized Educational Video(s) 02/05/2022 0816 by Debbrah Alar, RN Outcome: Not Progressing 02/05/2022 0816 by Debbrah Alar, RN Outcome: Not Progressing   Problem: Coping: Goal: Ability to adjust to condition or change in health will improve 02/05/2022 0816 by Debbrah Alar, RN Outcome: Not Progressing 02/05/2022 0816 by Debbrah Alar, RN Outcome: Not Progressing   Problem: Fluid Volume: Goal: Ability to maintain a balanced intake and output will improve 02/05/2022 0816 by Debbrah Alar, RN Outcome: Not Progressing 02/05/2022 0816 by Debbrah Alar, RN Outcome: Not Progressing   Problem: Health Behavior/Discharge Planning: Goal: Ability to identify and utilize available resources and services will improve 02/05/2022 0816 by Debbrah Alar, RN Outcome: Not Progressing 02/05/2022 0816 by Debbrah Alar, RN Outcome: Not Progressing Goal: Ability to manage health-related needs will improve 02/05/2022 0816 by Debbrah Alar, RN Outcome: Not Progressing 02/05/2022 0816 by Debbrah Alar, RN Outcome: Not Progressing   Problem: Metabolic: Goal: Ability to maintain appropriate glucose levels will improve 02/05/2022 0816 by Debbrah Alar, RN Outcome: Not Progressing 02/05/2022 0816 by Velia Meyer D, RN Outcome: Not Progressing   Problem: Nutritional: Goal: Maintenance of adequate nutrition will improve 02/05/2022 0816 by Debbrah Alar, RN Outcome: Not Progressing 02/05/2022 0816 by Debbrah Alar, RN Outcome: Not Progressing Goal: Progress toward achieving an optimal weight will improve 02/05/2022 0816 by Debbrah Alar,  RN Outcome: Not Progressing 02/05/2022 0816 by Velia Meyer D, RN Outcome: Not Progressing   Problem: Skin Integrity: Goal: Risk for impaired skin integrity will decrease 02/05/2022 0816 by Debbrah Alar, RN Outcome: Not Progressing 02/05/2022 0816 by Debbrah Alar, RN Outcome: Not Progressing

## 2022-02-05 NOTE — Progress Notes (Signed)
    RE:  Rebecca Barnes       Date of Birth:   [birth year]    Date:   02/04/22       To Whom It May Concern:  Please be advised that the above-named patient will require a short-term nursing home stay - anticipated 30 days or less for rehabilitation and strengthening.  The plan is for return home.                 MD signature                Date

## 2022-02-05 NOTE — Progress Notes (Signed)
06:00am Bladder scan - 453 ml In and out cath - 500 ml

## 2022-02-05 NOTE — TOC Progression Note (Signed)
Transition of Care [facility]) - Progression Note    Patient Details  Name: Rebecca Barnes MRN: [id number] Date of Birth: Jan 20, 1946  [facility]) CM/SW Contact  Lorri Frederick, LCSW Phone Number: 02/05/2022, 2:30 PM  Clinical Narrative:   CSW spoke with daughter [name].  First choice is still CIR, who has not made decision on whether they can admit.  Bed offers provided to daughter for SNF.  Level 2 passr docs uploaded.      Expected Discharge Plan: IP Rehab Facility Barriers to Discharge: Continued Medical Work up  Expected Discharge Plan and Services Expected Discharge Plan: IP Rehab Facility In-house Referral: Clinical Social Work   Post Acute Care Choice: IP Rehab Living arrangements for the past 2 months: Single Family Home                                       Social Determinants of Health (SDOH) Interventions    Readmission Risk Interventions     No data to display

## 2022-02-05 NOTE — Progress Notes (Signed)
Inpatient Rehabilitation Admissions Coordinator   I will follow up on Monday with her progress to assist in determining if appropriate for CIR vs SNF level rehab based on her progress with therapies.  Ottie Glazier, RN, MSN Rehab Admissions Coordinator [phone] 02/05/2022 12:39 PM

## 2022-02-05 NOTE — Progress Notes (Signed)
    Subjective:  Patient is a poor historian due to aphasia limiting subjective and physical exam. Patient is able to say yes and no, unsure of accuracy or baseline.  She has a Actuary at beside and is not crying out. Sitter reports that she has had some improvement and is doing better. No acute events reports.  When asked about pain she points to her distal anterior thigh that we discussed is normal postoperative pain after THA.   Objective:   VITALS:   Vitals:   02/04/22 2010 02/04/22 2200 02/05/22 0746 02/05/22 1129  BP: 109/61 (!) 112/59 (!) 117/54 122/66  Pulse: (!) 110 (!) 103 96 (!) 112  Resp: $Remo'18 18 20 20  'mbZDW$ Temp: 98.6 F (37 C) 98 F (36.7 C) 98.4 F (36.9 C) 99.3 F (37.4 C)  TempSrc: Oral Oral Oral Axillary  SpO2: 94% 98% 96% 97%  Weight:      Height:        Patient sitting up in bed with sitter at bedside. NAD. Patient appears more alert today and can follow commands during examination. Exam limited due to aphasia.  ABD soft Neurovascular intact Intact pulses distally Dorsiflexion/Plantar flexion intact Incision: dressing C/D/I No cellulitis present Compartment soft Patient appears to respond when testing sensation on bottom of foot. Unsure of accuracy due to communication.    Lab Results  Component Value Date   WBC 18.2 (H) 02/05/2022   HGB 8.7 (L) 02/05/2022   HCT 26.1 (L) 02/05/2022   MCV 81.1 02/05/2022   PLT 323 02/05/2022   BMET    Component Value Date/Time   NA 137 02/05/2022 0201   K 3.5 02/05/2022 0201   CL 104 02/05/2022 0201   CO2 23 02/05/2022 0201   GLUCOSE 157 (H) 02/05/2022 0201   BUN 39 (H) 02/05/2022 0201   CREATININE 0.94 02/05/2022 0201   CREATININE 0.52 (L) 12/02/2021 1103   CALCIUM 9.2 02/05/2022 0201   EGFR 97 12/02/2021 1103   GFRNONAA >60 02/05/2022 0201     Assessment/Plan: 3 Days Post-Op   Principal Problem:   Hip fracture (HCC) Active Problems:   Anxiety with depression   Type 2 diabetes mellitus without  complication, without long-term current use of insulin (HCC)   Essential hypertension   History of CVA (cerebrovascular accident)   Obesity (BMI 30-39.9)   Aphasia   Dyslipidemia   DNR (do not resuscitate)   Fever   Acute metabolic encephalopathy   WBAT with walker DVT ppx: Aspirin $RemoveBefo'81mg'orqGwSaiRqX$  BID and plavix, SCDs, TEDS PO pain control PT/OT: PT able to stand with patient but limited due to patients ability to follow commands. Continue to work with PT.  Dispo: Patient under hospitalist care, disposition per their recommendation. Continue to work with PT. Pain medication and DVT ppx in chart.   Charlott Rakes, PA-C 02/05/2022, 4:41 PM  [facility]  [place] [address], [address], [place], [place] [address] Phone: [phone] [email or web address] Facebook  [employer]

## 2022-02-06 DIAGNOSIS — R451 Restlessness and agitation: Secondary | ICD-10-CM

## 2022-02-06 DIAGNOSIS — R4701 Aphasia: Secondary | ICD-10-CM | POA: Diagnosis not present

## 2022-02-06 DIAGNOSIS — F418 Other specified anxiety disorders: Secondary | ICD-10-CM | POA: Diagnosis not present

## 2022-02-06 DIAGNOSIS — S72001A Fracture of unspecified part of neck of right femur, initial encounter for closed fracture: Secondary | ICD-10-CM | POA: Diagnosis not present

## 2022-02-06 DIAGNOSIS — Z66 Do not resuscitate: Secondary | ICD-10-CM | POA: Diagnosis not present

## 2022-02-06 LAB — CBC
HCT: 22.2 % — ABNORMAL LOW (ref 36.0–46.0)
Hemoglobin: 7.3 g/dL — ABNORMAL LOW (ref 12.0–15.0)
MCH: 26.7 pg (ref 26.0–34.0)
MCHC: 32.9 g/dL (ref 30.0–36.0)
MCV: 81.3 fL (ref 80.0–100.0)
Platelets: 347 10*3/uL (ref 150–400)
RBC: 2.73 MIL/uL — ABNORMAL LOW (ref 3.87–5.11)
RDW: 14.4 % (ref 11.5–15.5)
WBC: 12.4 10*3/uL — ABNORMAL HIGH (ref 4.0–10.5)
nRBC: 0 % (ref 0.0–0.2)

## 2022-02-06 LAB — BASIC METABOLIC PANEL
Anion gap: 8 (ref 5–15)
BUN: 23 mg/dL (ref 8–23)
CO2: 22 mmol/L (ref 22–32)
Calcium: 8.4 mg/dL — ABNORMAL LOW (ref 8.9–10.3)
Chloride: 103 mmol/L (ref 98–111)
Creatinine, Ser: 0.53 mg/dL (ref 0.44–1.00)
GFR, Estimated: >60 mL/min (ref >60)
Glucose, Bld: 153 mg/dL — ABNORMAL HIGH (ref 70–99)
Potassium: 3.6 mmol/L (ref 3.5–5.1)
Sodium: 133 mmol/L — ABNORMAL LOW (ref 135–145)

## 2022-02-06 LAB — GLUCOSE, CAPILLARY
Glucose-Capillary: 156 mg/dL — ABNORMAL HIGH (ref 70–99)
Glucose-Capillary: 122 mg/dL — ABNORMAL HIGH (ref 70–99)

## 2022-02-06 NOTE — Progress Notes (Signed)
OT Cancellation Note  Patient Details Name: Rebecca Barnes MRN: [id number] DOB: September 18, 1945   Cancelled Treatment:    Reason Eval/Treat Not Completed: Medical issues which prohibited therapy.  Spoke with sitter, patient with restless night, and sleeping currently.  OT will continue efforts as schedule permits.    Matthewjames Petrasek D Meekah Math 02/06/2022, 9:15 AM

## 2022-02-06 NOTE — Progress Notes (Signed)
PROGRESS NOTE    Rebecca Barnes  [id number]:[id number] DOB: 1945-11-18 DOA: 02/01/2022 PCP: Caesar Bookman, NP    Brief Narrative:  Rebecca Barnes is a 76 y.o. female with medical history significant of anxiety; hypertension, diabetes mellitus, CVA presented to hospital after sustaining a fall.  Of note, patient was recently admitted from 4/12-16 with an acute L MCA CVA and was discharged to [facility]; she remained in CIR from 4/16-23 and at the time of discharge required supervision for transfers due to R visual field deficit, aphasia, and apraxia.  With her aphasia, she is really not able to effectively provide history, mostly sounded like word salad.  Patient has been having difficulty communicating.  Has caregiver at home.  In the ED, patient was noted to have right femoral neck fracture. CT of the head was negative.  Orthopedics was consulted and underwent right total hip arthroplasty on 02/02/2022 and subsequently had increasing anxiety restlessness confusion and low-grade fever.  Chest x-ray showed some atelectasis.  Currently awaiting for CIR/skilled nursing facility placement.   During hospitalization, patient had episodes of agitation confusion and anxiety.  Needed one-to-one sitter.  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    Physical therapy on board , CIR versus skilled nursing facility placement at this time.  Continue aspirin Plavix, weightbearing as tolerated  Significant leukocytosis/mild fever,   acute metabolic encephalopathy.  Improved today.  WBC today at 12.4 and trending down from 18.2<18.7 < 23.7.  Chest x-ray with possible atelectasis.  Patient is unable to follow commands for incentive spirometry urinalysis and culture on admission without signs of infection.  COVID and influenza was negative.  Continue aspiration precautions.  Sedative-hypnotics, narcotic and muscle relaxants doses have been adjusted.  Procalcitonin at 0.3.  Check CBC in AM.  One-to-one sitter at bedside.  Recent CVA with persistent aphasia, agitation, anxiety Continue aspirin and Plavix.  Plan is for skilled nursing facility placement or CIR at this time.  Mood disorder, behavioral issues Klonopin has been changed to nighttime only.  Continue Atarax and fluoxetine.    Urinary retention.  Required in and out cath.  We will continue to monitor closely.  Essential HTN -Continue Norvasc,.  Losartan on hold.   Hyperlipidemia  Intolerant to statins.  On fenofibrate.  Diabetes mellitus type 2 Recent hemoglobin A1c was 6.3, continue sliding scale insulin.  Hold metformin for now.  Latest POC glucose of 182.  obesity -Body mass index is 37.44 kg/m. Would benefit from weight loss as outpatient.     DVT prophylaxis: SCDs Start: 02/02/22 1640   Code Status:     Code Status: DNR  Disposition: skilled nursing facility/CIR likely in 1 to 2 days.  Status is: Inpatient  Remains inpatient appropriate because: Status post hip surgery,  need for rehabilitation, agitation issues   Family Communication:  Spoke with the patient's daughter on 02/04/2022.  Consultants:  Orthopedics  Procedures:  Right total hip arthroplasty on 02/02/2022  Antimicrobials:  None currently  Anti-infectives (From admission,  onward)    Start     Dose/Rate Route Frequency Ordered Stop   02/02/22 1700  ceFAZolin (ANCEF) IVPB 2g/100 mL premix        2 g 200 mL/hr over 30 Minutes Intravenous Every 6 hours 02/02/22 1639 02/03/22 0018    02/02/22 1645  fluconazole (DIFLUCAN) tablet 150 mg        150 mg Oral Daily 02/02/22 1639 02/04/22 0902   02/02/22 1100  ceFAZolin (ANCEF) IVPB 2g/100 mL premix        2 g 200 mL/hr over 30 Minutes Intravenous On call to O.R. 02/02/22 0419 02/02/22 1242      Subjective: Today, patient was seen and examined at bedside.  Secondary school teacher at bedside.  Haldol during the nighttime.  Restraints with placed in but currently does not have any restraints.   Objective: Vitals:   02/05/22 1943 02/05/22 2148 02/05/22 2319 02/06/22 0700  BP: 137/67  (!) 94/56 (!) 113/56  Pulse: (!) 111 (!) 105 (!) 107 99  Resp: 20  20 20   Temp: 98.3 F (36.8 C)     TempSrc: Oral     SpO2: 96% 94% 94% 96%  Weight:      Height:        Intake/Output Summary (Last 24 hours) at 02/06/2022 0851 Last data filed at 02/05/2022 1500 Gross per 24 hour  Intake 360 ml  Output --  Net 360 ml   Filed Weights   02/01/22 0628  Weight: 102.1 kg  Body mass index is 37.44 kg/m.   Physical Examination:  General: Obese built, not in obvious distress, mildly anxious, alert awake and communicative, expressive aphasia, anxious HENT:   No scleral pallor or icterus noted. Oral mucosa is moist.  Chest:  Clear breath sounds.  Diminished breath sounds bilaterally. No crackles or wheezes.  CVS: S1 &S2 heard. No murmur.  Regular rate and rhythm. Abdomen: Soft, nontender, nondistended.  Bowel sounds are heard.   Extremities: No cyanosis, clubbing or edema.  Peripheral pulses are palpable. Psych: Alert, awake and negative, expressive aphasia,  CNS: Aphasia, moves all extremities, Skin: Warm and dry.  No rashes noted.  Data Reviewed:   CBC: Recent Labs  Lab 02/01/22 0630 02/03/22 0136 02/04/22 0131 02/05/22 0201 02/06/22 0143  WBC 12.5* 23.7* 18.7* 18.2* 12.4*  NEUTROABS 10.3*  --   --   --   --   HGB 13.6 10.4* 8.7* 8.7* 7.3*  HCT 41.2 32.0* 26.1* 26.1* 22.2*  MCV 81.1 81.2 80.6 81.1 81.3  PLT 388 351 287 323  347    Basic Metabolic Panel: Recent Labs  Lab 02/01/22 0630 02/03/22 0136 02/04/22 0131 02/05/22 0201 02/06/22 0143  NA 135 135 132* 137 133*  K 3.6 4.0 3.5 3.5 3.6  CL 101 104 102 104 103  CO2 22 25 22 23 22   GLUCOSE 147* 211* 191* 157* 153*  BUN 10 17 30* 39* 23  CREATININE 0.60 0.83 1.35* 0.94 0.53  CALCIUM 9.4 9.0 8.6* 9.2 8.4*  MG  --  1.9 1.9 2.2  --     Liver Function Tests: Recent Labs  Lab 02/01/22 0630  AST 18  ALT 13  ALKPHOS 80  BILITOT 1.0  PROT 7.1  ALBUMIN 3.7     Radiology Studies: No results found.    LOS: 5 days    , MD [facility] Available via Epic secure chat 7am-7pm After these hours, please refer to coverage provider listed on [email or web address] 02/06/2022, 8:51 AM

## 2022-02-06 NOTE — Progress Notes (Signed)
[facility] floor coverage for both [facility] and [facility] (remote) on night of 02/05/22 into morning of 02/06/22:    I was notified by RN that the patient is agitated, attempting to get out of bed, pulling at lines, and that this agitation/behavior has been refractory to attempts at verbal redirection.  I subsequently placed order for Haldol 2 mg IV every 6 hours as needed for agitation.  However, after initial dose of Haldol, no improvement in the above agitation/behavior noted.  Consequently, in the setting of associated interference with ongoing medical treatment posing potential harm to themself, I have placed orders for soft bilateral wrist restraints.    Newton Pigg, DO Hospitalist

## 2022-02-07 DIAGNOSIS — S72001A Fracture of unspecified part of neck of right femur, initial encounter for closed fracture: Secondary | ICD-10-CM | POA: Diagnosis not present

## 2022-02-07 DIAGNOSIS — Z66 Do not resuscitate: Secondary | ICD-10-CM | POA: Diagnosis not present

## 2022-02-07 DIAGNOSIS — F418 Other specified anxiety disorders: Secondary | ICD-10-CM | POA: Diagnosis not present

## 2022-02-07 DIAGNOSIS — R4701 Aphasia: Secondary | ICD-10-CM | POA: Diagnosis not present

## 2022-02-07 LAB — GLUCOSE, CAPILLARY
Glucose-Capillary: 159 mg/dL — ABNORMAL HIGH (ref 70–99)
Glucose-Capillary: 148 mg/dL — ABNORMAL HIGH (ref 70–99)
Glucose-Capillary: 164 mg/dL — ABNORMAL HIGH (ref 70–99)
Glucose-Capillary: 143 mg/dL — ABNORMAL HIGH (ref 70–99)

## 2022-02-07 LAB — CBC
HCT: 23.6 % — ABNORMAL LOW (ref 36.0–46.0)
Hemoglobin: 7.5 g/dL — ABNORMAL LOW (ref 12.0–15.0)
MCH: 26.2 pg (ref 26.0–34.0)
MCHC: 31.8 g/dL (ref 30.0–36.0)
MCV: 82.5 fL (ref 80.0–100.0)
Platelets: 433 10*3/uL — ABNORMAL HIGH (ref 150–400)
RBC: 2.86 MIL/uL — ABNORMAL LOW (ref 3.87–5.11)
RDW: 14.4 % (ref 11.5–15.5)
WBC: 8.6 10*3/uL (ref 4.0–10.5)
nRBC: 0 % (ref 0.0–0.2)

## 2022-02-07 LAB — BASIC METABOLIC PANEL
Anion gap: 12 (ref 5–15)
BUN: 11 mg/dL (ref 8–23)
CO2: 25 mmol/L (ref 22–32)
Calcium: 9.1 mg/dL (ref 8.9–10.3)
Chloride: 103 mmol/L (ref 98–111)
Creatinine, Ser: 0.52 mg/dL (ref 0.44–1.00)
GFR, Estimated: >60 mL/min (ref >60)
Glucose, Bld: 217 mg/dL — ABNORMAL HIGH (ref 70–99)
Potassium: 4.5 mmol/L (ref 3.5–5.1)
Sodium: 140 mmol/L (ref 135–145)

## 2022-02-07 NOTE — Progress Notes (Signed)
Occupational Therapy Treatment Patient Details Name: Rebecca Barnes MRN: [id number] DOB: 1946/07/17 Today's Date: 02/07/2022   History of present illness Rebecca Barnes is a 76 y.o. female admitted 6/12 after fall and found to have right femoral neck fracture.  Pt underwent right THA 6/13.  PMH:   anxiety; hypertension, diabetes mellitus, CVA-Of note, patient was recently admitted from 4/12-16 with an acute L MCA CVA and was discharged to [facility] for rehab prior to return home.   OT comments  Rebecca Barnes is functionally progressing. She followed most simple commands this session with no confusion, impulsivity or agitation noted. Overall she required mod A to get to the EOB and tolerated sitting for ~15 minutes during functional activities. Pt did not express an overt amount of pain throughout however, when a stand transfer was initiated she stated "no, no, no" and resisted. She continues to benefit. D/c is appropriate.    Recommendations for follow up therapy are one component of a multi-disciplinary discharge planning process, led by the attending physician.  Recommendations may be updated based on patient status, additional functional criteria and insurance authorization.    Follow Up Recommendations  Acute inpatient rehab (3hours/day)    Assistance Recommended at Discharge Frequent or constant Supervision/Assistance  Patient can return home with the following  A lot of help with bathing/dressing/bathroom;Assistance with cooking/housework;Direct supervision/assist for medications management;Direct supervision/assist for financial management;Help with stairs or ramp for entrance;Assist for transportation   Equipment Recommendations  None recommended by OT    Recommendations for Other Services Rehab consult    Precautions / Restrictions Precautions Precautions: Fall Precaution Comments: Clarified with PA that pt does not have hip precautions Restrictions Weight Bearing Restrictions: No RLE  Weight Bearing: Weight bearing as tolerated       Mobility Bed Mobility Overal bed mobility: Needs Assistance Bed Mobility: Supine to Sit, Sit to Supine     Supine to sit: Mod assist Sit to supine: Min assist   General bed mobility comments: assist for cues and to manage RLE    Transfers                   General transfer comment: attempted but pt immediately saying, "no, no, no."     Balance Overall balance assessment: Needs assistance Sitting-balance support: No upper extremity supported Sitting balance-Leahy Scale: Fair                                     ADL either performed or assessed with clinical judgement   ADL Overall ADL's : Needs assistance/impaired     Grooming: Minimal assistance;Sitting Grooming Details (indicate cue type and reason): min A for cues only                             Functional mobility during ADLs: Moderate assistance (bed level only) General ADL Comments: session completed while sitting EOB. Pt able to follow most simple commands and complete grooming with fair balance    Extremity/Trunk Assessment Upper Extremity Assessment Upper Extremity Assessment: RUE deficits/detail RUE Deficits / Details: no overhead ROM at shoulder, elbow and wrist are [facility] for ROM, digits seem apraxic. globally 4/5. Able to use functionally for oral hygiene. RUE Coordination: decreased fine motor;decreased gross motor   Lower Extremity Assessment Lower Extremity Assessment: Defer to PT evaluation        Vision   Vision Assessment?: Vision  impaired- to be further tested in functional context Additional Comments: hx of R visual field deficit; needs further assessment   Perception Perception Perception: Not tested   Praxis      Cognition Arousal/Alertness: Awake/alert Behavior During Therapy: WFL for tasks assessed/performed Overall Cognitive Status: Difficult to assess                                  General Comments: Pt presents with aphasia. she followed most simple 1 step commands with increased time for processing. She does best with visual and verbal cue        Exercises      Shoulder Instructions       General Comments VSS on RA, no family present    Pertinent Vitals/ Pain       Pain Assessment Pain Assessment: Faces Faces Pain Scale: Hurts a little bit Pain Location: assume R hip Pain Descriptors / Indicators: Grimacing, Guarding, Discomfort Pain Intervention(s): Limited activity within patient's tolerance, Monitored during session  Home Living                                          Prior Functioning/Environment              Frequency  Min 2X/week        Progress Toward Goals  OT Goals(current goals can now be found in the care plan section)  Progress towards OT goals: Progressing toward goals  Acute Rehab OT Goals Patient Stated Goal: unable to state OT Goal Formulation: Patient unable to participate in goal setting Time For Goal Achievement: 02/17/22 Potential to Achieve Goals: Good ADL Goals Pt Will Perform Lower Body Bathing: with supervision;sit to/from stand Pt Will Transfer to Toilet: with supervision;ambulating Pt Will Perform Toileting - Clothing Manipulation and hygiene: with supervision;sit to/from stand;sitting/lateral leans  Plan Discharge plan remains appropriate    Co-evaluation                 AM-PAC OT "6 Clicks" Daily Activity     Outcome Measure   Help from another person eating meals?: None Help from another person taking care of personal grooming?: A Little Help from another person toileting, which includes using toliet, bedpan, or urinal?: A Lot Help from another person bathing (including washing, rinsing, drying)?: A Lot Help from another person to put on and taking off regular upper body clothing?: A Little Help from another person to put on and taking off regular lower body clothing?: A  Lot 6 Click Score: 16    End of Session    OT Visit Diagnosis: Unsteadiness on feet (R26.81);Other abnormalities of gait and mobility (R26.89);Other symptoms and signs involving cognitive function   Activity Tolerance Patient tolerated treatment well   Patient Left in bed;with call bell/phone within reach;with bed alarm set   Nurse Communication Precautions        Time: 1740-8144 OT Time Calculation (min): 25 min  Charges: OT General Charges $OT Visit: 1 Visit OT Treatments $Self Care/Home Management : 23-37 mins   Rebecca Barnes A Catherene Kaleta 02/07/2022, 1:07 PM

## 2022-02-07 NOTE — Progress Notes (Addendum)
PROGRESS NOTE    Rebecca Barnes  OVZ:[id number] DOB: Jan 11, 1946 DOA: 02/01/2022 PCP: Caesar Bookman, NP    Brief Narrative:  Rebecca Barnes is a 76 y.o. female with medical history significant of anxiety; hypertension, diabetes mellitus, CVA presented to hospital after sustaining a fall.  Of note, patient was recently admitted from 4/12-16 with an acute L MCA CVA and was discharged to [facility]; she remained in CIR from 4/16-23 and at the time of discharge required supervision for transfers due to R visual field deficit, aphasia, and apraxia.  With her aphasia, she is really not able to effectively provide history, mostly sounded like word salad.  Patient has been having difficulty communicating.  Has caregiver at home.  In the ED, patient was noted to have right femoral neck fracture. CT of the head was negative.  Orthopedics was consulted and underwent right total hip arthroplasty on 02/02/2022 and subsequently had increasing anxiety restlessness confusion and low-grade fever.  Chest x-ray showed some atelectasis.  Currently awaiting for CIR/skilled nursing facility placement.   During hospitalization, patient had episodes of agitation confusion and anxiety during hospitalization.  Needed one-to-one sitter initially.  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    Physical therapy on board , CIR versus skilled nursing facility placement at this time.  Continue aspirin Plavix, weightbearing as  tolerated  Significant leukocytosis/mild fever,  acute metabolic encephalopathy.  Has tele-sitter with intermittent agitation.  WBC today 8.6 improving from 12.4< 18.2<18.7 < 23.7.  Chest x-ray with possible atelectasis.  Unable to use incentive spirometry.  COVID and influenza was negative.  Continue aspiration precautions.  Sedative-hypnotics, narcotic and muscle relaxants doses have been adjusted.  Procalcitonin at 0.3.  TeleSitter on board.  Recent CVA with persistent aphasia, agitation, anxiety Continue aspirin and Plavix.  Plan is for skilled nursing facility placement or CIR at this time.  Communicated with the patient's daughter on the phone.  Patient's daughter has a requested psychiatry to assess the patient at this time in terms of medication management/post stroke anxiety agitation and behavioral issues.  We will consult psychiatry.  Mood disorder, behavioral issues Klonopin has been changed to nighttime only.  Continue Atarax and fluoxetine.    Urinary retention.  Required in and out cath.  We will continue to monitor closely.  Essential HTN -Continue Norvasc, Losartan on hold.   Hyperlipidemia  Intolerant to statins.  On fenofibrate.  Diabetes mellitus type 2 Recent hemoglobin A1c was 6.3, continue sliding scale insulin.  Hold metformin for now.  Latest POC glucose of 159.  obesity -Body mass index is 37.44 kg/m. Would benefit from weight loss as outpatient.     DVT prophylaxis: SCDs Start: 02/02/22 1640   Code Status:     Code Status: DNR  Disposition:  Skilled nursing facility/CIR likely in 1 to 2 days.  Status is: Inpatient  Remains inpatient appropriate because: Status post hip surgery,  need for rehabilitation, agitation issues   Family Communication:  Spoke with the patient's daughter  on 02/07/2022 and updated her about the clinical condition of the patient.  Consultants:  Orthopedics Psychiatry consulted.  Procedures:  Right total hip arthroplasty on  02/02/2022  Antimicrobials:  None currently  Anti-infectives (From admission, onward)    Start     Dose/Rate Route Frequency Ordered Stop   02/02/22 1700  ceFAZolin (ANCEF) IVPB 2g/100 mL premix        2 g 200 mL/hr over 30 Minutes Intravenous Every 6 hours 02/02/22 1639 02/03/22 0018   02/02/22 1645  fluconazole (DIFLUCAN) tablet 150 mg        150 mg Oral Daily 02/02/22 1639 02/04/22 0902   02/02/22 1100  ceFAZolin (ANCEF) IVPB 2g/100 mL premix        2 g 200 mL/hr over 30 Minutes Intravenous On call to O.R. 02/02/22 0419 02/02/22 1242      Subjective: Today, patient was seen and examined at bedside.  TeleSitter at bedside.  Has episodes of agitation.   Objective: Vitals:   02/05/22 2319 02/06/22 0700 02/06/22 2017 02/07/22 0759  BP: (!) 94/56 (!) 113/56 (!) 119/46 (!) 148/58  Pulse: (!) 107 99 97 (!) 103  Resp: 20 20 20 18   Temp:      TempSrc:      SpO2: 94% 96% 100% 97%  Weight:      Height:       No intake or output data in the 24 hours ending 02/07/22 1450  Filed Weights   02/01/22 0628  Weight: 102.1 kg  Body mass index is 37.44 kg/m.   Physical Examination:  General: Obese built, not in obvious distress phylaxis, alert awake and communicative, expressive aphasia, anxious HENT:   No scleral pallor or icterus noted. Oral mucosa is moist.  Chest:  Clear breath sounds.  Diminished breath sounds bilaterally. No crackles or wheezes.  CVS: S1 &S2 heard. No murmur.  Regular rate and rhythm. Abdomen: Soft, nontender, nondistended.  Bowel sounds are heard.   Extremities: No cyanosis, clubbing or edema.  Peripheral pulses are palpable. Psych: Alert, awake and has expressive aphasia. CNS:  No cranial nerve deficits.  Power equal in all extremities.   Skin: Warm and dry.  No rashes noted.   Data Reviewed:   CBC: Recent Labs  Lab 02/01/22 0630 02/03/22 0136 02/04/22 0131 02/05/22 0201 02/06/22 0143 02/07/22 0939  WBC 12.5* 23.7* 18.7* 18.2* 12.4* 8.6   NEUTROABS 10.3*  --   --   --   --   --   HGB 13.6 10.4* 8.7* 8.7* 7.3* 7.5*  HCT 41.2 32.0* 26.1* 26.1* 22.2* 23.6*  MCV 81.1 81.2 80.6 81.1 81.3 82.5  PLT 388 351 287 323 347 433*    Basic Metabolic Panel: Recent Labs  Lab 02/03/22 0136 02/04/22 0131 02/05/22 0201 02/06/22 0143 02/07/22 0939  NA 135 132* 137 133* 140  K 4.0 3.5 3.5 3.6 4.5  CL 104 102 104 103 103  CO2 25 22 23 22 25   GLUCOSE 211* 191* 157* 153* 217*  BUN 17 30* 39* 23 11  CREATININE 0.83 1.35* 0.94 0.53 0.52  CALCIUM 9.0 8.6* 9.2 8.4* 9.1  MG 1.9 1.9 2.2  --   --     Liver Function Tests: Recent Labs  Lab 02/01/22 0630  AST 18  ALT 13  ALKPHOS 80  BILITOT 1.0  PROT 7.1  ALBUMIN 3.7     Radiology Studies: No results found.    LOS: 6 days    , MD [facility] Available  via Epic secure chat 7am-7pm After these hours, please refer to coverage provider listed on [email or web address] 02/07/2022, 2:50 PM

## 2022-02-08 DIAGNOSIS — N179 Acute kidney failure, unspecified: Secondary | ICD-10-CM

## 2022-02-08 DIAGNOSIS — R4701 Aphasia: Secondary | ICD-10-CM | POA: Diagnosis not present

## 2022-02-08 DIAGNOSIS — S72001A Fracture of unspecified part of neck of right femur, initial encounter for closed fracture: Secondary | ICD-10-CM | POA: Diagnosis not present

## 2022-02-08 DIAGNOSIS — F418 Other specified anxiety disorders: Secondary | ICD-10-CM | POA: Diagnosis not present

## 2022-02-08 DIAGNOSIS — Z66 Do not resuscitate: Secondary | ICD-10-CM | POA: Diagnosis not present

## 2022-02-08 LAB — GLUCOSE, CAPILLARY
Glucose-Capillary: 155 mg/dL — ABNORMAL HIGH (ref 70–99)
Glucose-Capillary: 173 mg/dL — ABNORMAL HIGH (ref 70–99)
Glucose-Capillary: 170 mg/dL — ABNORMAL HIGH (ref 70–99)
Glucose-Capillary: 160 mg/dL — ABNORMAL HIGH (ref 70–99)

## 2022-02-08 MED ORDER — QUETIAPINE FUMARATE 25 MG PO TABS
25.0000 mg | ORAL_TABLET | Freq: Every day | ORAL | Status: DC | PRN
  Administered 2022-02-08: 25 mg via ORAL
  Filled 2022-02-08: qty 1

## 2022-02-08 MED ORDER — QUETIAPINE FUMARATE 25 MG PO TABS: 25.0000 mg | ORAL_TABLET | Freq: Every day | ORAL | Status: DC | PRN

## 2022-02-08 NOTE — TOC Progression Note (Signed)
Transition of Care [facility]) - Progression Note    Patient Details  Name: Rebecca Barnes MRN: [id number] Date of Birth: 11-17-1945  Transition of Care [facility]) CM/SW Contact  Lorri Frederick, LCSW Phone Number: 02/08/2022, 2:52 PM  Clinical Narrative:   CSW spoke with daughter [name], presented bed offers.  She would like response from [facility].  Reached out to [name].  1430: CSW spoke with [name], they only have semi private room, can offer bed.  CSW spoke with [name], discussed the above, she would like to accept [facility] offer.  [name] informed.      Expected Discharge Plan: IP Rehab Facility Barriers to Discharge: Continued Medical Work up  Expected Discharge Plan and Services Expected Discharge Plan: IP Rehab Facility In-house Referral: Clinical Social Work   Post Acute Care Choice: IP Rehab Living arrangements for the past 2 months: Single Family Home                                       Social Determinants of Health (SDOH) Interventions    Readmission Risk Interventions     No data to display

## 2022-02-08 NOTE — Consult Note (Signed)
[facility] Psychiatry Followup Face-to-Face Psychiatric Evaluation   Service Date: February 08, 2022 LOS:  LOS: 7 days    Assessment  Rebecca Barnes is a 76 y.o. female admitted medically for 02/01/2022  6:22 AM for hip fracture secondary to fall. She carries the psychiatric diagnoses of depression and has a past medical history of recent L MCA CVA in 11/2021, hypertension, diabetes mellitus.Psychiatry was consulted for "confusion, agitation, depression, tearfulness, behavior issues after recent stroke, recommendations on medication, family request for assessment" by Dr. Rebekah Chesterfield .    Her current presentation of restriction and dysphoric presentation is most consistent with depression secondary to medical diagnoses. She meets criteria for this based on patient's depressed mood occurred after stroke and is largely related to loss of functionality since stroke.  Current outpatient psychotropic medications include Prozac 40 mg and Klonopin 0.5 mg nightly and historically she has had no adverse response to these medications. She was  compliant with medications prior to admission as evidenced by family endorsing so. On initial examination, patient is pleasant but has obvious expressive aphasia. Please see plan below for detailed recommendations.   Unfortunately patient is in a difficult position where she is not able to communicate verbally or through writing her thoughts.  Patient attempts to interact with those around her be grows frustrated because she cannot be understood.  Patient was at some points during assessment able to convey "hopeless" as a feeling.  Patient is no longer autonomous with most task and this is an abrupt change to who she was prior to the stroke.  These large changes have led to patient having dysphoric mood and may also provide increased risk for disorientation.  Unfortunately due to patient's CVA is difficult to accurately assess patient's mood as there is no determine weight to  consistently communicate with patient.   Diagnoses:  Active Hospital problems: Principal Problem:   Hip fracture (HCC) Active Problems:   Anxiety with depression   Type 2 diabetes mellitus without complication, without long-term current use of insulin (HCC)   Essential hypertension   History of CVA (cerebrovascular accident)   Obesity (BMI 30-39.9)   Aphasia   Dyslipidemia   DNR (do not resuscitate)   Acute metabolic encephalopathy   Agitation   AKI (acute kidney injury) (HCC)     Plan  ## Safety and Observation Level:  - Based on my clinical evaluation, I estimate the patient to be at low risk of self harm in the current setting - At this time, we recommend a routine level of observation. This decision is based on my review of the chart including patient's history and current presentation, interview of the patient, mental status examination, and consideration of suicide risk including evaluating suicidal ideation, plan, intent, suicidal or self-harm behaviors, risk factors, and protective factors. This judgment is based on our ability to directly address suicide risk, implement suicide prevention strategies and develop a safety plan while the patient is in the clinical setting. Please contact our team if there is a concern that risk level has changed.   ## Medications:  -- Continue home Prozac 40 mg daily --Continue home Klonopin 0.5 mg nightly - Start Seroquel 25 mg as needed no more than 100 mg/day, for agitation   ## Medical Decision Making Capacity:  Not formally assessed  ## Further Work-up:  -- Per primary   -- most recent EKG on 6/12 had QtC of 446 -- Pertinent labwork reviewed earlier this admission includes: BMP-WNL W/exception glucose 217  ##  Disposition:  -- Per primary  ## Behavioral / Environmental:    ##Legal Status   Thank you for this consult request. Recommendations have been communicated to the primary team.  We will sign off at this time.    PGY-2 Bobbye Morton, MD   NEW history  Relevant Aspects of Hospital Course:  Admitted on 02/01/2022 for hip fracture secondary to fall.  Patient Report:  Patient is pleasant and smiling when provider comes into the room.  Patient is not able to answer questions due to her expressive aphasia and rather she is able to get 3 words out that appear to align with the question before her statement becomes word salad.  Attempt was made to communicate with patient by writing however patient is right-handed and had a left-sided stroke.  Patient's writing was a series of scribbles, to which patient was able to recognize were not legible.  Patient was able to say "I am sorry."  Attempted to communicate with patient with yes or no questions.  It was difficult as patient continued to wish to talk in full sentences.  Patient did answer "yes" to suicidal ideation.  However, patient continues to answer with full sentences when asked about plan.  Again patient was also not really consistent when asked basic questions yes or no such as what is her name.  Patient would answer yes but would also try and continue to speak in full sentences.  Patient never said yes to having a plan.  Patient did at one point say the words "hopeless."  When provider prompted patient about feeling frustrated by her current predicament, patient did not along attempting possibly to endorse that this is how she felt.  Patient did not actively endorse HI or AVH during assessment.    Psychiatric History:  Information collected from EMR Anxiety  Family psych history:    Social History:    Family History:  Number The patient's family history includes Cancer in her mother.  Medical History: Past Medical History:  Diagnosis Date   Anxiety    Asthma    Diabetes mellitus without complication (HCC)    Hypertension    Stroke [facility])     Surgical History: Past Surgical History:  Procedure Laterality Date   arm fracture     arm  surgery   CHOLECYSTECTOMY     TOTAL HIP ARTHROPLASTY Right 02/02/2022   Procedure: TOTAL HIP ARTHROPLASTY ANTERIOR APPROACH;  Surgeon: Samson Frederic, MD;  Location: [facility] OR;  Service: Orthopedics;  Laterality: Right;    Medications:   Current Facility-Administered Medications:    acetaminophen (TYLENOL) tablet 650 mg, 650 mg, Oral, Q6H PRN, Pokhrel, Laxman, MD, 650 mg at 02/08/22 1620   amLODipine (NORVASC) tablet 5 mg, 5 mg, Oral, Daily, Swinteck, Arlys John, MD, 5 mg at 02/08/22 1751   aspirin chewable tablet 81 mg, 81 mg, Oral, BID WC, Hill, Avery S, PA-C, 81 mg at 02/08/22 1620   bisacodyl (DULCOLAX) EC tablet 5 mg, 5 mg, Oral, Daily PRN, Swinteck, Arlys John, MD   clonazePAM (KLONOPIN) tablet 0.5 mg, 0.5 mg, Oral, QHS, Pokhrel, Laxman, MD, 0.5 mg at 02/07/22 2111   clopidogrel (PLAVIX) tablet 75 mg, 75 mg, Oral, Daily, Pham, Minh Q, RPH-CPP, 75 mg at 02/08/22 0924   docusate sodium (COLACE) capsule 100 mg, 100 mg, Oral, BID, Swinteck, Arlys John, MD, 100 mg at 02/08/22 0258   ezetimibe (ZETIA) tablet 10 mg, 10 mg, Oral, Daily, Swinteck, Arlys John, MD, 10 mg at 02/08/22 0925   feeding supplement (ENSURE  ENLIVE / ENSURE PLUS) liquid 237 mL, 237 mL, Oral, BID BM, Swinteck, Arlys John, MD, 237 mL at 02/06/22 1226   fenofibrate tablet 54 mg, 54 mg, Oral, Daily, Swinteck, Arlys John, MD, 54 mg at 02/08/22 0924   FLUoxetine (PROZAC) capsule 40 mg, 40 mg, Oral, Daily, Swinteck, Arlys John, MD, 40 mg at 02/08/22 5102   haloperidol lactate (HALDOL) injection 2 mg, 2 mg, Intravenous, Q6H PRN, Howerter, Justin B, DO, 2 mg at 02/08/22 1142   hydrOXYzine (ATARAX) tablet 10 mg, 10 mg, Oral, TID PRN, Samson Frederic, MD, 10 mg at 02/07/22 1500   insulin aspart (novoLOG) injection 0-15 Units, 0-15 Units, Subcutaneous, TID WC, Swinteck, Arlys John, MD, 3 Units at 02/08/22 1230   insulin aspart (novoLOG) injection 0-5 Units, 0-5 Units, Subcutaneous, QHS, Swinteck, Arlys John, MD, 2 Units at 02/02/22 2236   MEDLINE mouth rinse, 15 mL, Mouth Rinse,  BID, Pokhrel, Laxman, MD, 15 mL at 02/08/22 0925   melatonin tablet 10 mg, 10 mg, Oral, QHS, Swinteck, Arlys John, MD, 10 mg at 02/07/22 2111   menthol-cetylpyridinium (CEPACOL) lozenge 3 mg, 1 lozenge, Oral, PRN **OR** phenol (CHLORASEPTIC) mouth spray 1 spray, 1 spray, Mouth/Throat, PRN, Swinteck, Arlys John, MD, 1 spray at 02/04/22 1641   [DISCONTINUED] metoCLOPramide (REGLAN) tablet 5-10 mg, 5-10 mg, Oral, Q8H PRN **OR** metoCLOPramide (REGLAN) injection 5-10 mg, 5-10 mg, Intravenous, Q8H PRN, Swinteck, Arlys John, MD   morphine (PF) 2 MG/ML injection 2 mg, 2 mg, Intravenous, Q4H PRN, Pokhrel, Laxman, MD, 2 mg at 02/04/22 1845   nystatin (MYCOSTATIN/NYSTOP) topical powder, , Topical, BID, Swinteck, Arlys John, MD, Given at 02/08/22 0925   ondansetron (ZOFRAN) tablet 4 mg, 4 mg, Oral, Q6H PRN **OR** ondansetron (ZOFRAN) injection 4 mg, 4 mg, Intravenous, Q6H PRN, Swinteck, Arlys John, MD   oxyCODONE (Oxy IR/ROXICODONE) immediate release tablet 5 mg, 5 mg, Oral, Q4H PRN, Pokhrel, Laxman, MD, 5 mg at 02/08/22 1112   polyethylene glycol (MIRALAX / GLYCOLAX) packet 17 g, 17 g, Oral, Daily, Pokhrel, Laxman, MD, 17 g at 02/08/22 0926   QUEtiapine (SEROQUEL) tablet 25 mg, 25 mg, Oral, Daily PRN, Eliseo Gum B, MD   sodium chloride tablet 1 g, 1 g, Oral, TID WC, Swinteck, Arlys John, MD, 1 g at 02/08/22 1621  Allergies: Allergies  Allergen Reactions   Januvia [Sitagliptin] Other (See Comments)    dizziness   Statins Other (See Comments)    Hospitalized on 2 occasions after trying statins. Patient became dizzy and increased fall risk   Sulfa Antibiotics Other (See Comments)    Childhood Allergy        Objective  Vital signs:  Temp:  [97.4 F (36.3 C)-98.3 F (36.8 C)] 98.3 F (36.8 C) (06/19 1451) Pulse Rate:  [88-105] 98 (06/19 1451) Resp:  [16-18] 17 (06/19 1451) BP: (132-154)/(55-62) 140/62 (06/19 1451) SpO2:  [94 %-100 %] 95 % (06/19 1451)  Psychiatric Specialty Exam:  Presentation  General Appearance:  Appropriate for Environment; Casual  Eye Contact:Good  Speech:Slurred (Expressive aphasia)  Speech Volume:Normal  Handedness:No data recorded  Mood and Affect  Mood:-- (Patient pointed to happy face when provided with options of emotions)  Affect:Non-Congruent (Patient appeared frustrated but overall pleasant)   Thought Process  Thought Processes:-- (Unknown-expressive aphasia)  Descriptions of Associations:-- (Defer)  Orientation:-- (Deferred)  Thought Content:-- (Defer)  History of Schizophrenia/Schizoaffective disorder:No data recorded Duration of Psychotic Symptoms:No data recorded Hallucinations:Hallucinations: None  Ideas of Reference:None  Suicidal Thoughts:Suicidal Thoughts: -- (Yes, but does not endorse plan with yes or no questions, likely passive)  Homicidal Thoughts:Homicidal Thoughts:  No   Sensorium  Memory:-- (Defer)  Judgment:-- (Deferred)  Insight:-- (Deferred)   Executive Functions  Concentration:-- (Deferred)  Attention Span:-- (Deferred)  Recall:No data recorded Fund of Knowledge:-- (deferred)  Language:Poor   Psychomotor Activity  Psychomotor Activity:Psychomotor Activity: Decreased   Assets  Assets:Social Support   Sleep  Sleep:No data recorded   Physical Exam: Physical Exam HENT:     Head: Normocephalic and atraumatic.  Pulmonary:     Effort: Pulmonary effort is normal.  Neurological:     Mental Status: She is alert.    Review of Systems  Psychiatric/Behavioral:  Positive for depression.    Blood pressure 140/62, pulse 98, temperature 98.3 F (36.8 C), temperature source Oral, resp. rate 17, height 5\' 5"  (1.651 m), weight 102.1 kg, SpO2 95 %. Body mass index is 37.44 kg/m.

## 2022-02-08 NOTE — Progress Notes (Signed)
Inpatient Rehabilitation Admissions Coordinator   I met with patient at bedside. Noted continued need for [employer]. Patient restless but able to be redirected. She has not yet demonstrated the ability for more intensive therapies required for a CIR admit. I spoke with her daughter, [name] , by phone and discussed my recommendation for SNF.  Patient needs to be ambulatory to return home. I will alert Acute team. We will sign off.  Danne Baxter, RN, MSN Rehab Admissions Coordinator [phone] 02/08/2022 11:31 AM

## 2022-02-08 NOTE — Plan of Care (Signed)
  Problem: Nutritional: Goal: Maintenance of adequate nutrition will improve Outcome: Progressing   Problem: Skin Integrity: Goal: Risk for impaired skin integrity will decrease Outcome: Progressing   Problem: Health Behavior/Discharge Planning: Goal: Ability to identify and utilize available resources and services will improve Outcome: Not Applicable   Problem: Tissue Perfusion: Goal: Adequacy of tissue perfusion will improve Outcome: Not Applicable

## 2022-02-08 NOTE — Progress Notes (Signed)
Physical Therapy Treatment Patient Details Name: Rebecca Barnes MRN: [id number] DOB: [birth year] Today's Date: 02/08/2022   History of Present Illness Rebecca Barnes is a 76 y.o. female admitted 6/12 after fall and found to have right femoral neck fracture.  Pt underwent right THA 6/13.  PMH:   anxiety; hypertension, diabetes mellitus, CVA-Of note, patient was recently admitted from 4/12-16 with an acute L MCA CVA and was discharged to [facility] for rehab prior to return home.    PT Comments    Pt with poor ability to perform standing balance with RW today and pt not safe to initiate any steps away from bed. Pt also with bowel incontinence which limited session. Pt's motor planning and cognition severely impaired (complicated by her aphasia) and she is unable to follow through with exercises when attempted.   Recommendations for follow up therapy are one component of a multi-disciplinary discharge planning process, led by the attending physician.  Recommendations may be updated based on patient status, additional functional criteria and insurance authorization.  Follow Up Recommendations  Skilled nursing-short term rehab (<3 hours/day)     Assistance Recommended at Discharge Frequent or constant Supervision/Assistance  Patient can return home with the following A lot of help with walking and/or transfers;Assistance with cooking/housework;Assist for transportation;Help with stairs or ramp for entrance;A lot of help with bathing/dressing/bathroom   Equipment Recommendations  None recommended by PT    Recommendations for Other Services Rehab consult     Precautions / Restrictions Precautions Precautions: Fall Precaution Comments: No hip precautions per PA Restrictions Weight Bearing Restrictions: Yes RLE Weight Bearing: Weight bearing as tolerated     Mobility  Bed Mobility Overal bed mobility: Needs Assistance Bed Mobility: Supine to Sit, Sit to Supine     Supine to sit: +2 for  safety/equipment, Max assist, +2 for physical assistance Sit to supine: Max assist, +2 for physical assistance, +2 for safety/equipment   General bed mobility comments: Pt required maximal assistance x 1 for supine to sit and maximal assistance x 2 for sit to supine. Pt demonstrated poor sitting balance inititally (retropulsion present) but able to sit EOB without support shortly thereafter.    Transfers Overall transfer level: Needs assistance Equipment used: Rolling walker (2 wheels) Transfers: Sit to/from Stand Sit to Stand: +2 safety/equipment, Mod assist, +2 physical assistance           General transfer comment: Pt with retropulsion and poor ability to keep front wheels of RW on floor despite extensive cues for posture and glute engagement. Pt unsteady with high risk for falling. Pt performed three sit <> stands on this encounter. Pt also performed a few side steps at side of bed with RW. Pt brought bed pan due to having bowel movement on third time standing.    Ambulation/Gait               General Gait Details: Few side steps at EOB; pt with poor standing posture and unsteady.   Stairs             Wheelchair Mobility    Modified Rankin (Stroke Patients Only)       Balance Overall balance assessment: Needs assistance Sitting-balance support: No upper extremity supported, Feet supported Sitting balance-Leahy Scale: Fair Sitting balance - Comments: Initially needs assist to maintain sitting balance.   Standing balance support: Bilateral upper extremity supported, During functional activity Standing balance-Leahy Scale: Poor Standing balance comment: relies on UE support and external support  Cognition Arousal/Alertness: Awake/alert Behavior During Therapy: Restless, Anxious, Impulsive Overall Cognitive Status: Difficult to assess Area of Impairment: Awareness, Problem solving, Following commands, Safety/judgement                        Following Commands: Follows one step commands inconsistently, Follows one step commands with increased time (Pt with poor ability to follow commands and requires increased verbal, tactile, and demonstration cues.) Safety/Judgement: Decreased awareness of safety, Decreased awareness of deficits   Problem Solving: Difficulty sequencing, Requires verbal cues, Requires tactile cues General Comments: Pt unable to orient to self and she verbalizes "yes" to two separate names.        Exercises      General Comments        Pertinent Vitals/Pain Pain Assessment Breathing: occasional labored breathing, short period of hyperventilation Negative Vocalization: occasional moan/groan, low speech, negative/disapproving quality Facial Expression: smiling or inexpressive Body Language: tense, distressed pacing, fidgeting Consolability: distracted or reassured by voice/touch PAINAD Score: 4 Pain Location: right hip Pain Descriptors / Indicators: Grimacing, Guarding, Discomfort    Home Living                          Prior Function            PT Goals (current goals can now be found in the care plan section) Acute Rehab PT Goals Patient Stated Goal: Pt unable to state Progress towards PT goals: Not progressing toward goals - comment (see above and assessment)    Frequency    Min 3X/week      PT Plan Current plan remains appropriate    Co-evaluation              AM-PAC PT "6 Clicks" Mobility   Outcome Measure  Help needed turning from your back to your side while in a flat bed without using bedrails?: A Lot Help needed moving from lying on your back to sitting on the side of a flat bed without using bedrails?: Total Help needed moving to and from a bed to a chair (including a wheelchair)?: Total Help needed standing up from a chair using your arms (e.g., wheelchair or bedside chair)?: Total Help needed to walk in hospital room?:  Total Help needed climbing 3-5 steps with a railing? : Total 6 Click Score: 7    End of Session Equipment Utilized During Treatment: Gait belt Activity Tolerance: Patient limited by fatigue Patient left: in bed;with bed alarm set;with nursing/sitter in room Nurse Communication: Mobility status (regarding pt on bed pan) PT Visit Diagnosis: Unsteadiness on feet (R26.81);Muscle weakness (generalized) (M62.81);Pain Pain - Right/Left: Right Pain - part of body: Leg     Time: 2297-9892 PT Time Calculation (min) (ACUTE ONLY): 17 min  Charges:  $Therapeutic Activity: 8-22 mins                    Tana Coast, PT    [employer] 02/08/2022, 3:25 PM

## 2022-02-08 NOTE — Progress Notes (Addendum)
PROGRESS NOTE    Rebecca Barnes  TGG:[id number] DOB: May 19, 1946 DOA: 02/01/2022 PCP: Caesar Bookman, NP    Brief Narrative:  Rebecca Barnes is a 76 y.o. female with medical history significant of anxiety; hypertension, diabetes mellitus, CVA presented to hospital after sustaining a fall.  Of note, patient was recently admitted from 4/12-16 with an acute L MCA CVA and was discharged to [facility]; she remained in CIR from 4/16-23 and at the time of discharge required supervision for transfers due to R visual field deficit, aphasia, and apraxia.  With her aphasia, she is really not able to effectively provide history, mostly sounded like word salad.  Patient has been having difficulty communicating.  Has caregiver at home.  In the ED, patient was noted to have right femoral neck fracture. CT of the head was negative.  Orthopedics was consulted and underwent right total hip arthroplasty on 02/02/2022 and subsequently had increasing anxiety, restlessness confusion and low-grade fever.  Chest x-ray showed some atelectasis.  Currently awaiting for CIR/skilled nursing facility placement.   During hospitalization, patient had episodes of agitation confusion and anxiety during hospitalization.  Needed one-to-one sitter initially.  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    Physical therapy on board , CIR versus skilled nursing facility placement at this time.  Continue aspirin Plavix, weightbearing as  tolerated  Significant leukocytosis/mild fever,  acute metabolic encephalopathy.  Improved at this time.  Has tele-sitter with intermittent agitation.  Discontinue TeleSitter.  Latest WBC at 8.6 improving from 12.4< 18.2<18.7 < 23.7.  Chest x-ray with possible atelectasis.  Unable to use incentive spirometry.  COVID and influenza was negative.  Continue aspiration precautions.  Sedative-hypnotics, narcotic and muscle relaxants doses have been adjusted.  Procalcitonin at 0.3.    Recent CVA with persistent aphasia, agitation, anxiety, mood disorder with behavioral issues Continue aspirin and Plavix.  Plan is for skilled nursing facility placement or CIR at this time.  Psychiatry has been consulted for medication management and assessment at this time as per family request.  Possibility of poststroke behavioral change/cognitive decline. Klonopin has been changed to nighttime only.  Continue Atarax and Flexeril.  Urinary retention.  Required in and out cath.  We will continue to monitor closely.  No further episodes of urinary retention.  Essential HTN -Continue Norvasc, Losartan on hold.   Hyperlipidemia  Intolerant to statins.  On fenofibrate.  Diabetes mellitus type 2 Recent hemoglobin A1c was 6.3, continue sliding scale insulin.  Hold metformin for now.  Latest POC glucose of 159.  Acute Kidney Injury.  Present on admission.  Has improved at this time.  Latest creatinine of 0.5.  obesity -Body mass index is 37.44 kg/m. Would benefit from weight loss as outpatient.     DVT prophylaxis: SCDs Start: 02/02/22 1640   Code Status:     Code Status: DNR  Disposition:  Skilled nursing facility/CIR likely in 1 to 2 days.  Status is: Inpatient  Remains inpatient appropriate because: Status post hip surgery,  need for rehabilitation,    Family Communication:  Spoke with the patient's daughter on 02/07/2022   Consultants:  Orthopedics Psychiatry   Procedures:  Right total hip  arthroplasty on 02/02/2022  Antimicrobials:  None currently  Anti-infectives (From admission, onward)    Start     Dose/Rate Route Frequency Ordered Stop   02/02/22 1700  ceFAZolin (ANCEF) IVPB 2g/100 mL premix        2 g 200 mL/hr over 30 Minutes Intravenous Every 6 hours 02/02/22 1639 02/03/22 0018   02/02/22 1645  fluconazole (DIFLUCAN) tablet 150 mg        150 mg Oral Daily 02/02/22 1639 02/04/22 0902   02/02/22 1100  ceFAZolin (ANCEF) IVPB 2g/100 mL premix        2 g 200 mL/hr over 30 Minutes Intravenous On call to O.R. 02/02/22 0419 02/02/22 1242      Subjective: Today, patient was seen and examined at bedside.  Appears to be more calm and composed today.  Follows few commands but has expressive aphasia.   Objective: Vitals:   02/07/22 2057 02/08/22 0629 02/08/22 0738 02/08/22 0939  BP: (!) 154/55 132/61 (!) 143/60   Pulse: (!) 105 91 92 88  Resp: 18 16 18 18   Temp: (!) 97.4 F (36.3 C) 97.7 F (36.5 C) 98 F (36.7 C)   TempSrc: Oral Oral Oral   SpO2: 94% 95% 99% 100%  Weight:      Height:        Intake/Output Summary (Last 24 hours) at 02/08/2022 1407 Last data filed at 02/08/2022 1349 Gross per 24 hour  Intake 720 ml  Output --  Net 720 ml    Filed Weights   02/01/22 0628  Weight: 102.1 kg  Body mass index is 37.44 kg/m.   Physical Examination:  General: Obese built, not in obvious distress, expressive aphasia, anxious HENT:   No scleral pallor or icterus noted. Oral mucosa is moist.  Chest:  Clear breath sounds.  Diminished breath sounds bilaterally. No crackles or wheezes.  CVS: S1 &S2 heard. No murmur.  Regular rate and rhythm. Abdomen: Soft, nontender, nondistended.  Bowel sounds are heard.   Extremities: No cyanosis, clubbing or edema.  Peripheral pulses are palpable. Psych: Alert, awake and has expressive aphasia, CNS: Expressive aphasia.  Follows commands. Skin: Warm and dry.  No rashes noted.    Data Reviewed:   CBC: Recent Labs  Lab  02/03/22 0136 02/04/22 0131 02/05/22 0201 02/06/22 0143 02/07/22 0939  WBC 23.7* 18.7* 18.2* 12.4* 8.6  HGB 10.4* 8.7* 8.7* 7.3* 7.5*  HCT 32.0* 26.1* 26.1* 22.2* 23.6*  MCV 81.2 80.6 81.1 81.3 82.5  PLT 351 287 323 347 433*    Basic Metabolic Panel: Recent Labs  Lab 02/03/22 0136 02/04/22 0131 02/05/22 0201 02/06/22 0143 02/07/22 0939  NA 135 132* 137 133* 140  K 4.0 3.5 3.5 3.6 4.5  CL 104 102 104 103 103  CO2 25 22 23 22 25   GLUCOSE 211* 191* 157* 153* 217*  BUN 17 30* 39* 23 11  CREATININE 0.83 1.35* 0.94 0.53 0.52  CALCIUM 9.0 8.6* 9.2 8.4* 9.1  MG 1.9 1.9 2.2  --   --     Liver Function Tests: No results for input(s): "AST", "ALT", "ALKPHOS", "BILITOT", "PROT", "ALBUMIN" in the last 168 hours.    Radiology Studies: No results found.    LOS: 7 days    02/09/22, MD [facility] Available via Epic secure chat 7am-7pm After these  hours, please refer to coverage provider listed on [email or web address] 02/08/2022, 2:07 PM

## 2022-02-08 NOTE — Plan of Care (Signed)
  Problem: Nutritional: Goal: Maintenance of adequate nutrition will improve Outcome: Progressing   Problem: Skin Integrity: Goal: Risk for impaired skin integrity will decrease Outcome: Progressing   Problem: Elimination: Goal: Will not experience complications related to bowel motility Outcome: Progressing   Problem: Pain Managment: Goal: General experience of comfort will improve Outcome: Progressing

## 2022-02-09 DIAGNOSIS — N179 Acute kidney failure, unspecified: Secondary | ICD-10-CM | POA: Diagnosis not present

## 2022-02-09 DIAGNOSIS — S72001A Fracture of unspecified part of neck of right femur, initial encounter for closed fracture: Secondary | ICD-10-CM | POA: Diagnosis not present

## 2022-02-09 DIAGNOSIS — G9341 Metabolic encephalopathy: Secondary | ICD-10-CM | POA: Diagnosis not present

## 2022-02-09 DIAGNOSIS — R451 Restlessness and agitation: Secondary | ICD-10-CM | POA: Diagnosis not present

## 2022-02-09 LAB — GLUCOSE, CAPILLARY
Glucose-Capillary: 147 mg/dL — ABNORMAL HIGH (ref 70–99)
Glucose-Capillary: 164 mg/dL — ABNORMAL HIGH (ref 70–99)
Glucose-Capillary: 142 mg/dL — ABNORMAL HIGH (ref 70–99)

## 2022-02-09 MED ORDER — DOCUSATE SODIUM 100 MG PO CAPS: 100.0000 mg | ORAL_CAPSULE | Freq: Two times a day (BID) | ORAL | Status: DC

## 2022-02-09 MED ORDER — CLONAZEPAM 0.5 MG PO TABS: 0.5000 mg | ORAL_TABLET | Freq: Every day | ORAL | 0 refills | Status: DC | PRN

## 2022-02-09 MED ORDER — BISACODYL 5 MG PO TBEC: 5.0000 mg | DELAYED_RELEASE_TABLET | Freq: Every day | ORAL | 0 refills | Status: DC | PRN

## 2022-02-09 MED ORDER — ENSURE ENLIVE PO LIQD: 237.0000 mL | Freq: Two times a day (BID) | ORAL | Status: DC

## 2022-02-09 MED ORDER — QUETIAPINE FUMARATE 25 MG PO TABS: 25.0000 mg | ORAL_TABLET | Freq: Every evening | ORAL | Status: DC | PRN

## 2022-02-09 MED ORDER — ONDANSETRON HCL 4 MG PO TABS: 4.0000 mg | ORAL_TABLET | Freq: Four times a day (QID) | ORAL | Status: DC | PRN

## 2022-02-09 NOTE — Progress Notes (Signed)
[facility] was called to give report. Voice message was left, awaiting for response.

## 2022-02-09 NOTE — TOC Transition Note (Signed)
Transition of Care [facility]) - CM/SW Discharge Note   Patient Details  Name: Rebecca Barnes MRN: [id number] Date of Birth: December 03, 1945  [facility]) CM/SW Contact:  Lorri Frederick, LCSW Phone Number: 02/09/2022, 9:52 AM   Clinical Narrative:   Pt discharging to [facility].  RN call [phone] for report. Transportation has been scheduled for 2pm per SNF request.     Final next level of care: Skilled Nursing Facility Barriers to Discharge: Barriers Resolved   Patient Goals and CMS Choice     Choice offered to / list presented to : Adult Children (daughter [name])  Discharge Placement              Patient chooses bed at:  [facility]) Patient to be transferred to facility by: [facility] Name of family member notified: daughter [name] Patient and family notified of of transfer: 02/09/22  Discharge Plan and Services In-house Referral: Clinical Social Work   Post Acute Care Choice: IP Rehab                               Social Determinants of Health (SDOH) Interventions     Readmission Risk Interventions     No data to display

## 2022-02-09 NOTE — Progress Notes (Signed)
Occupational Therapy Treatment Patient Details Name: Rebecca Barnes MRN: [id number] DOB: 07/09/1946 Today's Date: 02/09/2022   History of present illness [name] is a 76 y.o. female admitted 6/12 after fall and found to have right femoral neck fracture.  Pt underwent right THA 6/13.  PMH:   anxiety; hypertension, diabetes mellitus, CVA-Of note, patient was recently admitted from 4/12-16 with an acute L MCA CVA and was discharged to [facility] for rehab prior to return home.   OT comments  Pt progressing gradually towards OT goals with improving attention, consistent command following and noted appropriate responses. Pt continues to be impulsive with movements, benefitting from safety/pacing cues with BSC transfers completed at Min A. Pt continues to require Max A for LB ADLs d/t R LE discomfort and decreased flexibility. Noted AIR signed off on pt, so updating DC recs to SNF rehab as pt at increased risk for falls and below functional baseline.   Recommendations for follow up therapy are one component of a multi-disciplinary discharge planning process, led by the attending physician.  Recommendations may be updated based on patient status, additional functional criteria and insurance authorization.    Follow Up Recommendations  Skilled nursing-short term rehab (<3 hours/day) (AIR denied)    Assistance Recommended at Discharge Frequent or constant Supervision/Assistance  Patient can return home with the following  A lot of help with bathing/dressing/bathroom;Assistance with cooking/housework;Direct supervision/assist for medications management;Direct supervision/assist for financial management;Help with stairs or ramp for entrance;Assist for transportation   Equipment Recommendations  None recommended by OT    Recommendations for Other Services      Precautions / Restrictions Precautions Precautions: Fall Precaution Comments: No hip precautions per PA Restrictions Weight Bearing  Restrictions: Yes RLE Weight Bearing: Weight bearing as tolerated       Mobility Bed Mobility Overal bed mobility: Needs Assistance Bed Mobility: Supine to Sit, Sit to Supine     Supine to sit: Mod assist, HOB elevated Sit to supine: Mod assist   General bed mobility comments: Able to bring LEs off of bed though significant assist needed to lift trunk with posterior LOB    Transfers Overall transfer level: Needs assistance Equipment used: Rolling walker (2 wheels) Transfers: Sit to/from Stand, Bed to chair/wheelchair/BSC Sit to Stand: Min assist Stand pivot transfers: Min assist         General transfer comment: posterior bias at times, assist needed to maneuver RW and safety/pacing cues needed for [facility] transfer and short mobility back to bed     Balance Overall balance assessment: Needs assistance Sitting-balance support: No upper extremity supported, Feet supported Sitting balance-Leahy Scale: Fair     Standing balance support: Bilateral upper extremity supported, During functional activity Standing balance-Leahy Scale: Poor                             ADL either performed or assessed with clinical judgement   ADL Overall ADL's : Needs assistance/impaired                     Lower Body Dressing: Maximal assistance;Sit to/from stand Lower Body Dressing Details (indicate cue type and reason): able to bring L LE up to self though difficulty reaching toes. with assist to don over toes, pt able to pull L sock up with increased time. unable to [facility] R LE in this manner so required assist for this sock Toilet Transfer: Minimal assistance;Stand-pivot;BSC/3in1;Rolling walker (2 wheels) Toilet Transfer Details (indicate cue  type and reason): cues for safety and assist to maneuver RW. pt impulsive with quick movements Toileting- Clothing Manipulation and Hygiene: Maximal assistance;Sit to/from stand       Functional mobility during ADLs: Minimal  assistance;Rolling walker (2 wheels);Cueing for sequencing;Cueing for safety General ADL Comments: Improving responses and standing/mobility attempts.    Extremity/Trunk Assessment Upper Extremity Assessment Upper Extremity Assessment: RUE deficits/detail RUE Deficits / Details: no overhead ROM at shoulder, elbow and wrist are [facility] for ROM, digits seem apraxic. globally 4/5. Able to use functionally RUE Coordination: decreased fine motor;decreased gross motor   Lower Extremity Assessment Lower Extremity Assessment: Defer to PT evaluation        Vision   Vision Assessment?: Vision impaired- to be further tested in functional context   Perception     Praxis      Cognition Arousal/Alertness: Awake/alert Behavior During Therapy: Restless, Impulsive Overall Cognitive Status: Difficult to assess Area of Impairment: Awareness, Problem solving, Following commands, Safety/judgement                       Following Commands: Follows one step commands consistently, Follows one step commands with increased time Safety/Judgement: Decreased awareness of safety, Decreased awareness of deficits Awareness: Emergent Problem Solving: Difficulty sequencing, Requires verbal cues, Requires tactile cues General Comments: attention improving to tasks at hand, remains impulsive with movement at times. follows directions consistently and able to answer more questions appropriate though nonsensical statements still made        Exercises      Shoulder Instructions       General Comments      Pertinent Vitals/ Pain       Pain Assessment Pain Assessment: Faces Faces Pain Scale: Hurts a little bit Pain Location: right hip Pain Descriptors / Indicators: Grimacing, Guarding, Discomfort Pain Intervention(s): Monitored during session  Home Living                                          Prior Functioning/Environment              Frequency  Min 2X/week         Progress Toward Goals  OT Goals(current goals can now be found in the care plan section)  Progress towards OT goals: Progressing toward goals  Acute Rehab OT Goals Patient Stated Goal: appreciative of the assistance to get out of bed OT Goal Formulation: Patient unable to participate in goal setting Time For Goal Achievement: 02/17/22 Potential to Achieve Goals: Good ADL Goals Pt Will Perform Lower Body Bathing: with supervision;sit to/from stand Pt Will Transfer to Toilet: with supervision;ambulating Pt Will Perform Toileting - Clothing Manipulation and hygiene: with supervision;sit to/from stand;sitting/lateral leans  Plan Discharge plan needs to be updated    Co-evaluation                 AM-PAC OT "6 Clicks" Daily Activity     Outcome Measure   Help from another person eating meals?: None Help from another person taking care of personal grooming?: A Little Help from another person toileting, which includes using toliet, bedpan, or urinal?: A Lot Help from another person bathing (including washing, rinsing, drying)?: A Lot Help from another person to put on and taking off regular upper body clothing?: A Little Help from another person to put on and taking off regular lower body clothing?: A Lot  6 Click Score: 16    End of Session Equipment Utilized During Treatment: Gait belt  OT Visit Diagnosis: Unsteadiness on feet (R26.81);Other abnormalities of gait and mobility (R26.89);Other symptoms and signs involving cognitive function   Activity Tolerance Patient tolerated treatment well   Patient Left in bed;with call bell/phone within reach;with bed alarm set   Nurse Communication          Time: 8786-7672 OT Time Calculation (min): 15 min  Charges: OT General Charges $OT Visit: 1 Visit OT Treatments $Self Care/Home Management : 8-22 mins  Bradd Canary, OTR/L Acute Rehab Services Office: [phone]   Lorre Munroe 02/09/2022, 12:55 PM

## 2022-02-09 NOTE — Discharge Summary (Signed)
Physician Discharge Summary   Patient: Rebecca Barnes MRN: [id number] DOB: [birth year]  Admit date:     02/01/2022  Discharge date: 02/09/22  Discharge Physician: Joycelyn Das   PCP: Rebecca Bookman, NP   Recommendations at discharge:   Follow-up with your primary care provider at the skilled nursing facility in 3 to 5 days. Check CBC BMP magnesium LFT in the next visit. Follow-up with [facility] Dr. Linna Caprice in 1 week, schedule an appointment for surgical follow-up  Discharge Diagnoses: Principal Problem:   Hip fracture (HCC) Active Problems:   Anxiety with depression   Type 2 diabetes mellitus without complication, without long-term current use of insulin (HCC)   Essential hypertension   History of CVA (cerebrovascular accident)   Obesity (BMI 30-39.9)   Aphasia   Dyslipidemia   DNR (do not resuscitate)   Agitation  Resolved Problems:   Stroke (HCC)   Fever   Acute metabolic encephalopathy   AKI (acute kidney injury) [facility])  Hospital Course: Rebecca Barnes is a 76 y.o. female with medical history significant of anxiety; hypertension, diabetes mellitus, CVA presented to hospital after sustaining a fall.  Of note, patient was recently admitted from 4/12-16 with an acute L MCA CVA and was discharged to [facility]; she remained in CIR from 4/16-23 and at the time of discharge required supervision for transfers due to R visual field deficit, aphasia, and apraxia.  With her aphasia, she is really not able to effectively provide history, mostly sounded like word salad.  Patient has been having difficulty communicating.  Has caregiver at home.  In the ED, patient was noted to have right femoral neck fracture. CT of the head was negative.  Orthopedics was consulted and underwent right total hip arthroplasty on 02/02/2022   Assessment and plan   Principal Problem:   Hip fracture (HCC) Active Problems:   Anxiety with depression   Type 2 diabetes mellitus without complication,  without long-term current use of insulin (HCC)   Essential hypertension   History of CVA (cerebrovascular accident)   Obesity (BMI 30-39.9)   Aphasia   Dyslipidemia   DNR (do not resuscitate)   Fever   Acute metabolic encephalopathy   Agitation   Right femoral neck fracture status post right total hip arthroplasty 02/02/2022 Status post mechanical fall with right hip fracture.    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  Continue aspirin Plavix, weightbearing as tolerated.  Patient will need to follow-up with orthopedics in 2 weeks after surgery.  Leukocytosis/mild fever,  acute metabolic encephalopathy.  After surgery.  Likely postoperative atelectasis.  Improved at this time.  COVID and influenza was negative.  Continue aspiration precautions.  Sedative-hypnotics, narcotic and muscle relaxants were addressed during hospitalization. procalcitonin at 0.3.    Recent CVA with persistent aphasia, agitation, anxiety, mood disorder with behavioral issues Improved at this time.  Continue aspirin and Plavix.  Psychiatry was consulted for medication management and assessment and recommended Seroquel as needed for agitation.  Klonopin has been changed to nighttime only.  Continue Atarax on discharge.  Urinary retention.  Required in and out cath.  No further issues at this time.  Essential HTN -Continue Norvasc, Losartan   Hyperlipidemia  Intolerant to statins.  On fenofibrate.  Diabetes mellitus type 2 Recent hemoglobin A1c was 6.3, on metformin at home.  Will be resumed on discharge.  Acute Kidney Injury.  Present on admission.  Has improved at this time.  Latest creatinine of 0.5.  obesity -Body mass index  is 37.44 kg/m. Would benefit from ongoing weight loss as outpatient.   Consultants:  Orthopedics Psychiatry Procedures performed:  Right total hip arthroplasty on 02/02/2022   Disposition: Skilled nursing facility.  Communicated with the patient's daughter on the  phone and updated her about the clinical condition of the patient and the plan for disposition  Diet recommendation:  Discharge Diet Orders (From admission, onward)     Start     Ordered   02/09/22 0000  Diet - low sodium heart healthy        02/09/22 0920           Cardiac and Carb modified diet DISCHARGE MEDICATION: Allergies as of 02/09/2022       Reactions   Januvia [sitagliptin] Other (See Comments)   dizziness   Statins Other (See Comments)   Hospitalized on 2 occasions after trying statins. Patient became dizzy and increased fall risk   Sulfa Antibiotics Other (See Comments)   Childhood Allergy         Medication List     TAKE these medications    acetaminophen 325 MG tablet Commonly known as: TYLENOL Take 650 mg by mouth every 6 (six) hours as needed for mild pain or headache.   amLODipine 5 MG tablet Commonly known as: NORVASC TAKE 1 TABLET(5 MG) BY MOUTH DAILY What changed: See the new instructions.   aspirin 81 MG chewable tablet Chew 1 tablet (81 mg total) by mouth 2 (two) times daily with a meal.   bisacodyl 5 MG EC tablet Commonly known as: DULCOLAX Take 1 tablet (5 mg total) by mouth daily as needed for moderate constipation.   camphor-menthol lotion Commonly known as: SARNA Apply topically as needed for itching. What changed: how much to take   clonazePAM 0.5 MG tablet Commonly known as: KLONOPIN Take 1 tablet (0.5 mg total) by mouth daily as needed for anxiety. What changed: when to take this   clopidogrel 75 MG tablet Commonly known as: PLAVIX TAKE 1 TABLET(75 MG) BY MOUTH DAILY What changed: See the new instructions.   docusate sodium 100 MG capsule Commonly known as: COLACE Take 1 capsule (100 mg total) by mouth 2 (two) times daily.   feeding supplement Liqd Take 237 mLs by mouth 2 (two) times daily between meals.   fenofibrate 54 MG tablet TAKE 1 TABLET(54 MG) BY MOUTH DAILY What changed: See the new instructions.    FLUoxetine 40 MG capsule Commonly known as: PROZAC Take one capsule by mouth once daily. What changed:  how much to take how to take this when to take this   hydrOXYzine 10 MG tablet Commonly known as: ATARAX TAKE 1 TABLET(10 MG) BY MOUTH EVERY 8 HOURS AS NEEDED FOR ANXIETY What changed: See the new instructions.   losartan 100 MG tablet Commonly known as: COZAAR TAKE 1 TABLET(100 MG) BY MOUTH DAILY What changed: See the new instructions.   Melatonin 10 MG Tabs Take 10 mg by mouth at bedtime.   metFORMIN 500 MG tablet Commonly known as: GLUCOPHAGE TAKE 1 TABLET(500 MG) BY MOUTH DAILY WITH BREAKFAST What changed: See the new instructions.   ondansetron 4 MG tablet Commonly known as: ZOFRAN Take 1 tablet (4 mg total) by mouth every 6 (six) hours as needed for nausea.   oxyCODONE 5 MG immediate release tablet Commonly known as: Roxicodone Take 1 tablet (5 mg total) by mouth every 4 (four) hours as needed for up to 7 days for severe pain or moderate pain.  oxymetazoline 0.05 % nasal spray Commonly known as: Afrin Nasal Spray Spray 1 spray into affected nostril if bleeding is uncontrolled after 20 min of direct pressure   QUEtiapine 25 MG tablet Commonly known as: SEROQUEL Take 1 tablet (25 mg total) by mouth at bedtime as needed (agitation/sundowning).   sodium chloride 1 g tablet TAKE 1 TABLET(1 GRAM) BY MOUTH THREE TIMES DAILY What changed: See the new instructions.        Follow-up Information     Swinteck, Arlys John, MD. Schedule an appointment as soon as possible for a visit in 1 week(s).   Specialty: Orthopedic Surgery Why: For wound re-check, For suture removal Contact information: [address] [place] [address] [phone]                Discharge Exam: Ceasar Mons Weights   02/01/22 0628  Weight: 102.1 kg   General: Obese built, not in obvious distress, expressive aphasia, mildly anxious HENT:   No scleral pallor or icterus  noted. Oral mucosa is moist.  Chest:  Clear breath sounds.  Diminished breath sounds bilaterally. No crackles or wheezes.  CVS: S1 &S2 heard. No murmur.  Regular rate and rhythm. Abdomen: Soft, nontender, nondistended.  Bowel sounds are heard.   Extremities: No cyanosis, clubbing or edema.  Peripheral pulses are palpable.  Right hip surgical site healthy. Psych: Alert, awake and expressive aphasia, follows commands, speaks few words CNS: Expressive aphasia, follows commands, mildly anxious Skin: Warm and dry.  No rashes noted.   Condition at discharge: good  The results of significant diagnostics from this hospitalization (including imaging, microbiology, ancillary and laboratory) are listed below for reference.   Imaging Studies: DG Chest Port 1 View  Result Date: 02/03/2022 CLINICAL DATA:  Fever, shortness of breath, recent fall EXAM: PORTABLE CHEST 1 VIEW COMPARISON:  02/01/2022 FINDINGS: Transverse diameter of heart is slightly increased. Thoracic aorta is tortuous. There are no signs of pulmonary edema or focal consolidation. There is slight increase in interstitial markings in the lower lung fields. There is no pleural effusion or pneumothorax. IMPRESSION: Cardiomegaly. There is subtle interval increase in markings in the lower lung fields. This may suggest crowding of markings due to poor inspiration or subsegmental atelectasis or contusion. Electronically Signed   By: Ernie Avena M.D.   On: 02/03/2022 16:45   Pelvis Portable  Result Date: 02/02/2022 CLINICAL DATA:  Post right hip replacement EXAM: PORTABLE PELVIS 1-2 VIEWS COMPARISON:  None Available. FINDINGS: Post right hip total arthroplasty with single cerclage wire. Postoperative changes in the soft tissues. No evidence of complication. 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 Electronically Signed   By: Guadlupe Spanish M.D.   On: 02/02/2022 16:15   DG HIP UNILAT WITH PELVIS 2-3 VIEWS  RIGHT  Result Date: 02/02/2022 CLINICAL DATA:  Right hip arthroplasty EXAM: DG HIP (WITH OR WITHOUT PELVIS) 2-3V RIGHT COMPARISON:  02/01/2022 FINDINGS: Three fluoroscopic images are obtained during the performance of the procedure and are provided for interpretation only. Images demonstrate right total hip arthroplasty, in near anatomic alignment. No perihardware lucency or fracture. Fluoroscopy time: 19 seconds 2.28 mGy IMPRESSION: Right total hip arthroplasty. Electronically Signed   By: Wiliam Ke M.D.   On: 02/02/2022 15:15   DG C-Arm 1-60 Min-No Report  Result Date: 02/02/2022 Fluoroscopy was utilized by the requesting physician.  No radiographic interpretation.   DG C-Arm 1-60 Min-No Report  Result Date: 02/02/2022 Fluoroscopy was utilized by the requesting physician.  No  radiographic interpretation.   CT HIP RIGHT WO CONTRAST  Result Date: 02/01/2022 CLINICAL DATA:  Hip surgical planning EXAM: CT OF THE RIGHT HIP WITHOUT CONTRAST TECHNIQUE: Multidetector CT imaging of the right hip was performed according to the standard protocol. Multiplanar CT image reconstructions were also generated. RADIATION DOSE REDUCTION: This exam was performed according to the departmental dose-optimization program which includes automated exposure control, adjustment of the mA and/or kV according to patient size and/or use of iterative reconstruction technique. COMPARISON:  Pelvis radiograph 02/01/2022 FINDINGS: Bones/Joint/Cartilage There is a minimally displaced subcapital right femoral neck fracture with longitudinal component extending towards the base of the femoral neck. Small right hip joint effusion. 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 Ligaments Suboptimally assessed by CT. Muscles and Tendons No acute myotendinous abnormality by CT. Soft tissues No focal fluid collection. Pelvic structures Sigmoid diverticulosis.  Normal appendix. IMPRESSION: Minimally displaced subcapital right femoral neck  fracture with longitudinal component extending towards the base of the femoral neck. Small joint effusion. 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 Electronically Signed   By: Caprice Renshaw M.D.   On: 02/01/2022 11:31   CT Head Wo Contrast  Result Date: 02/01/2022 CLINICAL DATA:  76 year old female with history of minor head trauma. EXAM: CT HEAD WITHOUT CONTRAST CT CERVICAL SPINE WITHOUT CONTRAST TECHNIQUE: Multidetector CT imaging of the head and cervical spine was performed following the standard protocol without intravenous contrast. Multiplanar CT image reconstructions of the cervical spine were also generated. RADIATION DOSE REDUCTION: This exam was performed according to the departmental dose-optimization program which includes automated exposure control, adjustment of the mA and/or kV according to patient size and/or use of iterative reconstruction technique. COMPARISON:  Head CT 12/02/2021. Brain MRI 12/03/2021. CT angiography of the neck 12/04/2021. 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 Mild cerebral atrophy. Physiologic calcifications in the basal ganglia incidentally noted. Patchy and confluent areas of decreased attenuation are noted throughout the deep and periventricular white matter of the cerebral hemispheres bilaterally, compatible with chronic microvascular ischemic disease. No evidence of acute infarction, hemorrhage, hydrocephalus, extra-axial collection or mass lesion/mass effect. Vascular: Hyperdense left MCA. Atherosclerotic calcifications in the cerebral vasculature. Skull: Normal. Negative for fracture or focal lesion. Sinuses/Orbits: No  acute finding. Other: None. CT CERVICAL SPINE FINDINGS Alignment: Normal. Skull base and vertebrae: No acute fracture. No primary bone lesion or focal pathologic process. Soft tissues and spinal canal: No prevertebral fluid or swelling. No visible canal hematoma. Disc levels: Multilevel degenerative disc disease, most pronounced at C4-C5 and C6-C7. Moderate multilevel facet arthropathy. Upper chest: Unremarkable. Other: None. IMPRESSION: 1. No definite acute intracranial abnormalities. 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 2. Extensive chronic microvascular ischemic changes in the cerebral white matter, as above. 3. No evidence of significant acute traumatic injury to the cervical spine. 4. Multilevel degenerative disc disease and cervical spondylosis, as above. Electronically Signed   By: Trudie Reed M.D.   On: 02/01/2022 08:07   CT Cervical Spine Wo Contrast  Result Date: 02/01/2022 CLINICAL DATA:  76 year old female with history of minor head trauma. EXAM: CT HEAD WITHOUT CONTRAST CT CERVICAL SPINE WITHOUT CONTRAST TECHNIQUE: Multidetector CT imaging of the head and cervical spine was performed following the standard protocol without  intravenous contrast. Multiplanar CT image reconstructions of the cervical spine were also generated. RADIATION DOSE REDUCTION: This exam was performed according to the departmental dose-optimization program which includes automated exposure control, adjustment of the mA and/or kV according to patient size and/or use of iterative reconstruction technique. COMPARISON:  Head CT 12/02/2021. Brain MRI 12/03/2021. CT angiography of the neck 12/04/2021. FINDINGS: CT HEAD FINDINGS Brain: When compared to the prior head CT there has been interval evolution of the previously noted left MCA territory infarct, with extensive areas of low-attenuation throughout the left temporal lobe and to a lesser extent the left parieto-occipital region, compatible with  evolving encephalomalacia. 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 Mild cerebral atrophy. Physiologic calcifications in the basal ganglia incidentally noted. Patchy and confluent areas of decreased attenuation are noted throughout the deep and periventricular white matter of the cerebral hemispheres bilaterally, compatible with chronic microvascular ischemic disease. No evidence of acute infarction, hemorrhage, hydrocephalus, extra-axial collection or mass lesion/mass effect. Vascular: Hyperdense left MCA. Atherosclerotic calcifications in the cerebral vasculature. Skull: Normal. Negative for fracture or focal lesion. Sinuses/Orbits: No acute finding. Other: None. CT CERVICAL SPINE FINDINGS Alignment: Normal. Skull base and vertebrae: No acute fracture. No primary bone lesion or focal pathologic process. Soft tissues and spinal canal: No prevertebral fluid or swelling. No visible canal hematoma. Disc levels: Multilevel degenerative disc disease, most pronounced at C4-C5 and C6-C7. Moderate multilevel facet arthropathy. Upper chest: Unremarkable. Other: None. IMPRESSION: 1. No definite acute intracranial abnormalities. 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 2. Extensive chronic microvascular ischemic changes in the cerebral white matter, as above. 3. No evidence of significant acute traumatic injury to the cervical spine. 4. Multilevel degenerative disc disease and cervical spondylosis, as above. Electronically Signed   By: Trudie Reed M.D.   On: 02/01/2022 08:07   DG FEMUR, MIN 2 VIEWS RIGHT  Result Date: 02/01/2022 CLINICAL DATA:  Fall with right hip pain. EXAM: RIGHT FEMUR 2 VIEWS; CHEST  1 VIEW; PELVIS - 1-2 VIEW COMPARISON:  Portable chest 12/02/2021. No prior pelvic or femur films. FINDINGS: Chest: There is mild cardiomegaly. Aorta is mildly ectatic with atherosclerosis of the transverse segment. Stable mediastinum. No vascular congestion is seen. The  lungs hypoexpanded but generally clear with limited view bases. There are multiple overlying monitor wires. Osteopenia.  Chronic healed fracture mid shaft left clavicle. There are moderate degenerative changes of the shoulders. Thoracic spondylosis. AP pelvis, single view: Osteopenia. There is no evidence of pelvic fractures or diastasis. Mild symmetric degenerative arthrosis is noted of the hips SI joints and pubic symphysis. Advanced degenerative change visualized lower lumbar spine. Right femoral series: Osteopenia. There is an acute right femoral neck fracture but it is difficult to adequately characterize due to positioning. As best as can be seen, this appears to be an oblique mid to distal femoral neck fracture with mild degree of impaction and otherwise nondisplaced. There is degenerative arthrosis at the hip and moderately the femorotibial joint. There is no erosive arthropathy. There are patchy calcifications in the femoral artery. There is artifact from overlying clothing. IMPRESSION: 1. No evidence of acute chest process. Hypoinflated with mild cardiomegaly. 2. Acute right femoral neck fracture, not well seen but appears to be an oblique mid to distal femoral neck fracture with mild impaction. 3. Osteopenia and degenerative change. 4. Calcific plaques in the femoral artery.  Aortic atherosclerosis. Electronically Signed   By: Almira Bar M.D.   On: 02/01/2022 07:17   DG  Chest 1 View  Result Date: 02/01/2022 CLINICAL DATA:  Fall with right hip pain. EXAM: RIGHT FEMUR 2 VIEWS; CHEST  1 VIEW; PELVIS - 1-2 VIEW COMPARISON:  Portable chest 12/02/2021. No prior pelvic or femur films. FINDINGS: Chest: There is mild cardiomegaly. Aorta is mildly ectatic with atherosclerosis of the transverse segment. Stable mediastinum. No vascular congestion is seen. The lungs hypoexpanded but generally clear with limited view bases. There are multiple overlying monitor wires. Osteopenia.  Chronic healed fracture mid  shaft left clavicle. There are moderate degenerative changes of the shoulders. Thoracic spondylosis. AP pelvis, single view: Osteopenia. There is no evidence of pelvic fractures or diastasis. Mild symmetric degenerative arthrosis is noted of the hips SI joints and pubic symphysis. Advanced degenerative change visualized lower lumbar spine. Right femoral series: Osteopenia. There is an acute right femoral neck fracture but it is difficult to adequately characterize due to positioning. As best as can be seen, this appears to be an oblique mid to distal femoral neck fracture with mild degree of impaction and otherwise nondisplaced. There is degenerative arthrosis at the hip and moderately the femorotibial joint. There is no erosive arthropathy. There are patchy calcifications in the femoral artery. There is artifact from overlying clothing. IMPRESSION: 1. No evidence of acute chest process. Hypoinflated with mild cardiomegaly. 2. Acute right femoral neck fracture, not well seen but appears to be an oblique mid to distal femoral neck fracture with mild impaction. 3. Osteopenia and degenerative change. 4. Calcific plaques in the femoral artery.  Aortic atherosclerosis. Electronically Signed   By: Almira BarKeith  Chesser M.D.   On: 02/01/2022 07:17   DG Pelvis 1-2 Views  Result Date: 02/01/2022 CLINICAL DATA:  Fall with right hip pain. EXAM: RIGHT FEMUR 2 VIEWS; CHEST  1 VIEW; PELVIS - 1-2 VIEW COMPARISON:  Portable chest 12/02/2021. No prior pelvic or femur films. FINDINGS: Chest: There is mild cardiomegaly. Aorta is mildly ectatic with atherosclerosis of the transverse segment. Stable mediastinum. No vascular congestion is seen. The lungs hypoexpanded but generally clear with limited view bases. There are multiple overlying monitor wires. Osteopenia.  Chronic healed fracture mid shaft left clavicle. There are moderate degenerative changes of the shoulders. Thoracic spondylosis. AP pelvis, single view: Osteopenia. There is no  evidence of pelvic fractures or diastasis. Mild symmetric degenerative arthrosis is noted of the hips SI joints and pubic symphysis. Advanced degenerative change visualized lower lumbar spine. Right femoral series: Osteopenia. There is an acute right femoral neck fracture but it is difficult to adequately characterize due to positioning. As best as can be seen, this appears to be an oblique mid to distal femoral neck fracture with mild degree of impaction and otherwise nondisplaced. There is degenerative arthrosis at the hip and moderately the femorotibial joint. There is no erosive arthropathy. There are patchy calcifications in the femoral artery. There is artifact from overlying clothing. IMPRESSION: 1. No evidence of acute chest process. Hypoinflated with mild cardiomegaly. 2. Acute right femoral neck fracture, not well seen but appears to be an oblique mid to distal femoral neck fracture with mild impaction. 3. Osteopenia and degenerative change. 4. Calcific plaques in the femoral artery.  Aortic atherosclerosis. Electronically Signed   By: Almira BarKeith  Chesser M.D.   On: 02/01/2022 07:17    Microbiology: Results for orders placed or performed during the hospital encounter of 02/01/22  Urine Culture     Status: Abnormal   Collection Time: 02/01/22  6:19 AM   Specimen: Urine, Clean Catch  Result Value Ref  Range Status   Specimen Description URINE, CLEAN CATCH  Final   Special Requests   Final    NONE Performed at [facility], [address] [place], [place] [address]    Culture MULTIPLE SPECIES PRESENT, SUGGEST RECOLLECTION (A)  Final   Report Status 02/02/2022 FINAL  Final  Surgical pcr screen     Status: Abnormal   Collection Time: 02/02/22 11:07 AM   Specimen: Nasal Mucosa; Nasal Swab  Result Value Ref Range Status   MRSA, PCR NEGATIVE NEGATIVE Final   Staphylococcus aureus POSITIVE (A) NEGATIVE Final    Comment: (NOTE) The Xpert SA Assay (FDA approved for NASAL specimens in patients  19 years of age and older), is one component of a comprehensive surveillance program. It is not intended to diagnose infection nor to guide or monitor treatment. Performed at [facility], [address] [place], [place] [address]     Labs: CBC: Recent Labs  Lab 02/03/22 0136 02/04/22 0131 02/05/22 0201 02/06/22 0143 02/07/22 0939  WBC 23.7* 18.7* 18.2* 12.4* 8.6  HGB 10.4* 8.7* 8.7* 7.3* 7.5*  HCT 32.0* 26.1* 26.1* 22.2* 23.6*  MCV 81.2 80.6 81.1 81.3 82.5  PLT 351 287 323 347 433*   Basic Metabolic Panel: Recent Labs  Lab 02/03/22 0136 02/04/22 0131 02/05/22 0201 02/06/22 0143 02/07/22 0939  NA 135 132* 137 133* 140  K 4.0 3.5 3.5 3.6 4.5  CL 104 102 104 103 103  CO2 GLUCOSE 211* 191* 157* 153* 217*  BUN 17 30* 39* 23 11  CREATININE 0.83 1.35* 0.94 0.53 0.52  CALCIUM 9.0 8.6* 9.2 8.4* 9.1  MG 1.9 1.9 2.2  --   --    Liver Function Tests: No results for input(s): "AST", "ALT", "ALKPHOS", "BILITOT", "PROT", "ALBUMIN" in the last 168 hours. CBG: Recent Labs  Lab 02/08/22 0737 02/08/22 1128 02/08/22 1608 02/08/22 2018 02/09/22 0740  GLUCAP 155* 173* 170* 160* 147*    Discharge time spent: greater than 30 minutes.  Signed: Joycelyn Das, MD [facility] 02/09/2022

## 2022-02-10 ENCOUNTER — Telehealth: Payer: Self-pay

## 2022-02-10 ENCOUNTER — Ambulatory Visit: Payer: Medicare Other | Admitting: Neurology

## 2022-02-10 NOTE — Telephone Encounter (Signed)
Transition Care Management Unsuccessful Follow-up Telephone Call  Date of discharge and from where:  [facility], 02/09/2022  Attempts:  1st Attempt  Reason for unsuccessful TCM follow-up call:  Unable to be reached ;due to release into a skilled nursing facility.

## 2022-02-14 ENCOUNTER — Emergency Department (HOSPITAL_COMMUNITY): Payer: Medicare Other

## 2022-02-14 ENCOUNTER — Observation Stay (HOSPITAL_COMMUNITY)
Admission: EM | Admit: 2022-02-14 | Discharge: 2022-02-15 | Disposition: A | Discharge status: Skilled Nursing Facility | Payer: Medicare Other | Attending: Family Medicine | Admitting: Family Medicine

## 2022-02-14 ENCOUNTER — Other Ambulatory Visit: Payer: Self-pay

## 2022-02-14 ENCOUNTER — Encounter (HOSPITAL_COMMUNITY): Payer: Self-pay

## 2022-02-14 DIAGNOSIS — I10 Essential (primary) hypertension: Secondary | ICD-10-CM | POA: Diagnosis present

## 2022-02-14 DIAGNOSIS — Z79899 Other long term (current) drug therapy: Secondary | ICD-10-CM | POA: Insufficient documentation

## 2022-02-14 DIAGNOSIS — K922 Gastrointestinal hemorrhage, unspecified: Principal | ICD-10-CM | POA: Diagnosis present

## 2022-02-14 DIAGNOSIS — Z7982 Long term (current) use of aspirin: Secondary | ICD-10-CM | POA: Diagnosis not present

## 2022-02-14 DIAGNOSIS — L7632 Postprocedural hematoma of skin and subcutaneous tissue following other procedure: Secondary | ICD-10-CM | POA: Insufficient documentation

## 2022-02-14 DIAGNOSIS — Z96641 Presence of right artificial hip joint: Secondary | ICD-10-CM | POA: Insufficient documentation

## 2022-02-14 DIAGNOSIS — F419 Anxiety disorder, unspecified: Secondary | ICD-10-CM | POA: Diagnosis present

## 2022-02-14 DIAGNOSIS — K921 Melena: Secondary | ICD-10-CM | POA: Diagnosis present

## 2022-02-14 DIAGNOSIS — J45909 Unspecified asthma, uncomplicated: Secondary | ICD-10-CM | POA: Diagnosis not present

## 2022-02-14 DIAGNOSIS — Z7984 Long term (current) use of oral hypoglycemic drugs: Secondary | ICD-10-CM | POA: Insufficient documentation

## 2022-02-14 DIAGNOSIS — E119 Type 2 diabetes mellitus without complications: Secondary | ICD-10-CM | POA: Diagnosis not present

## 2022-02-14 DIAGNOSIS — Z8673 Personal history of transient ischemic attack (TIA), and cerebral infarction without residual deficits: Secondary | ICD-10-CM

## 2022-02-14 DIAGNOSIS — S7001XA Contusion of right hip, initial encounter: Secondary | ICD-10-CM

## 2022-02-14 DIAGNOSIS — Z66 Do not resuscitate: Secondary | ICD-10-CM | POA: Diagnosis present

## 2022-02-14 DIAGNOSIS — Z7902 Long term (current) use of antithrombotics/antiplatelets: Secondary | ICD-10-CM | POA: Diagnosis not present

## 2022-02-14 DIAGNOSIS — D649 Anemia, unspecified: Secondary | ICD-10-CM

## 2022-02-14 DIAGNOSIS — R4701 Aphasia: Secondary | ICD-10-CM | POA: Diagnosis present

## 2022-02-14 DIAGNOSIS — E785 Hyperlipidemia, unspecified: Secondary | ICD-10-CM

## 2022-02-14 LAB — HEMOGLOBIN AND HEMATOCRIT, BLOOD
HCT: 25.5 % — ABNORMAL LOW (ref 36.0–46.0)
HCT: 25.5 % — ABNORMAL LOW (ref 36.0–46.0)
HCT: 22.9 % — ABNORMAL LOW (ref 36.0–46.0)
Hemoglobin: 8.1 g/dL — ABNORMAL LOW (ref 12.0–15.0)
Hemoglobin: 8 g/dL — ABNORMAL LOW (ref 12.0–15.0)
Hemoglobin: 7.2 g/dL — ABNORMAL LOW (ref 12.0–15.0)

## 2022-02-14 LAB — PROTIME-INR
INR: 1.1 (ref 0.8–1.2)
Prothrombin Time: 14.4 seconds (ref 11.4–15.2)

## 2022-02-14 LAB — CBC WITH DIFFERENTIAL/PLATELET
Abs Immature Granulocytes: 0.09 10*3/uL — ABNORMAL HIGH (ref 0.00–0.07)
Basophils Absolute: 0 10*3/uL (ref 0.0–0.1)
Basophils Relative: 0 %
Eosinophils Absolute: 0.3 10*3/uL (ref 0.0–0.5)
Eosinophils Relative: 4 %
HCT: 26.1 % — ABNORMAL LOW (ref 36.0–46.0)
Hemoglobin: 8.2 g/dL — ABNORMAL LOW (ref 12.0–15.0)
Immature Granulocytes: 1 %
Lymphocytes Relative: 22 %
Lymphs Abs: 2 10*3/uL (ref 0.7–4.0)
MCH: 25.8 pg — ABNORMAL LOW (ref 26.0–34.0)
MCHC: 31.4 g/dL (ref 30.0–36.0)
MCV: 82.1 fL (ref 80.0–100.0)
Monocytes Absolute: 0.8 10*3/uL (ref 0.1–1.0)
Monocytes Relative: 9 %
Neutro Abs: 6.1 10*3/uL (ref 1.7–7.7)
Neutrophils Relative %: 64 %
Platelets: 654 10*3/uL — ABNORMAL HIGH (ref 150–400)
RBC: 3.18 MIL/uL — ABNORMAL LOW (ref 3.87–5.11)
RDW: 14.9 % (ref 11.5–15.5)
WBC: 9.4 10*3/uL (ref 4.0–10.5)
nRBC: 0 % (ref 0.0–0.2)

## 2022-02-14 LAB — COMPREHENSIVE METABOLIC PANEL
ALT: 14 U/L (ref 0–44)
AST: 13 U/L — ABNORMAL LOW (ref 15–41)
Albumin: 2.7 g/dL — ABNORMAL LOW (ref 3.5–5.0)
Alkaline Phosphatase: 81 U/L (ref 38–126)
Anion gap: 6 (ref 5–15)
BUN: 16 mg/dL (ref 8–23)
CO2: 27 mmol/L (ref 22–32)
Calcium: 9.2 mg/dL (ref 8.9–10.3)
Chloride: 107 mmol/L (ref 98–111)
Creatinine, Ser: 0.62 mg/dL (ref 0.44–1.00)
GFR, Estimated: >60 mL/min (ref >60)
Glucose, Bld: 152 mg/dL — ABNORMAL HIGH (ref 70–99)
Potassium: 3.6 mmol/L (ref 3.5–5.1)
Sodium: 140 mmol/L (ref 135–145)
Total Bilirubin: 0.6 mg/dL (ref 0.3–1.2)
Total Protein: 6.6 g/dL (ref 6.5–8.1)

## 2022-02-14 LAB — GLUCOSE, CAPILLARY
Glucose-Capillary: 119 mg/dL — ABNORMAL HIGH (ref 70–99)
Glucose-Capillary: 122 mg/dL — ABNORMAL HIGH (ref 70–99)

## 2022-02-14 LAB — TYPE AND SCREEN
ABO/RH(D): O NEG
Antibody Screen: NEGATIVE

## 2022-02-14 LAB — APTT: aPTT: 30 seconds (ref 24–36)

## 2022-02-14 MED ORDER — ONDANSETRON HCL 4 MG PO TABS: 4.0000 mg | ORAL_TABLET | Freq: Four times a day (QID) | ORAL | Status: DC | PRN

## 2022-02-14 MED ORDER — LORAZEPAM 2 MG/ML IJ SOLN
0.5000 mg | Freq: Once | INTRAMUSCULAR | Status: AC
  Administered 2022-02-14: 0.5 mg via INTRAVENOUS
  Filled 2022-02-14: qty 1

## 2022-02-14 MED ORDER — SODIUM CHLORIDE 0.9 % IV SOLN
Freq: Once | INTRAVENOUS | Status: AC
  Administered 2022-02-14: 75 mL via INTRAVENOUS

## 2022-02-14 MED ORDER — ACETAMINOPHEN 325 MG PO TABS: 650.0000 mg | ORAL_TABLET | Freq: Four times a day (QID) | ORAL | Status: DC | PRN

## 2022-02-14 MED ORDER — INSULIN ASPART 100 UNIT/ML IJ SOLN
0.0000 [IU] | Freq: Every day | INTRAMUSCULAR | Status: DC
  Filled 2022-02-14: qty 0.05

## 2022-02-14 MED ORDER — ONDANSETRON HCL 4 MG/2ML IJ SOLN: 4.0000 mg | Freq: Four times a day (QID) | INTRAMUSCULAR | Status: DC | PRN

## 2022-02-14 MED ORDER — LORAZEPAM 2 MG/ML IJ SOLN
0.5000 mg | Freq: Three times a day (TID) | INTRAMUSCULAR | Status: DC | PRN
Start: 2022-02-14 — End: 2022-02-15
  Administered 2022-02-14 – 2022-02-15 (×2): 0.5 mg via INTRAVENOUS
  Filled 2022-02-14 (×2): qty 1

## 2022-02-14 MED ORDER — ACETAMINOPHEN 650 MG RE SUPP: 650.0000 mg | Freq: Four times a day (QID) | RECTAL | Status: DC | PRN

## 2022-02-14 MED ORDER — SODIUM CHLORIDE (PF) 0.9 % IJ SOLN
INTRAMUSCULAR | Status: AC
  Filled 2022-02-14: qty 50

## 2022-02-14 MED ORDER — SODIUM CHLORIDE 0.9 % IV BOLUS
1000.0000 mL | Freq: Once | INTRAVENOUS | Status: AC
  Administered 2022-02-14: 1000 mL via INTRAVENOUS

## 2022-02-14 MED ORDER — HYDROXYZINE HCL 10 MG PO TABS
10.0000 mg | ORAL_TABLET | Freq: Once | ORAL | Status: AC
  Administered 2022-02-14: 10 mg via ORAL
  Filled 2022-02-14: qty 1

## 2022-02-14 MED ORDER — IOHEXOL 350 MG/ML SOLN
100.0000 mL | Freq: Once | INTRAVENOUS | Status: AC | PRN
  Administered 2022-02-14: 100 mL via INTRAVENOUS

## 2022-02-14 MED ORDER — OXYCODONE HCL 5 MG PO TABS
5.0000 mg | ORAL_TABLET | ORAL | Status: DC | PRN
  Administered 2022-02-15 (×2): 5 mg via ORAL
  Filled 2022-02-14 (×2): qty 1

## 2022-02-14 MED ORDER — IOHEXOL 300 MG/ML  SOLN
100.0000 mL | Freq: Once | INTRAMUSCULAR | Status: AC | PRN
Start: 2022-02-14 — End: 2022-02-14
  Administered 2022-02-14: 100 mL via INTRAVENOUS

## 2022-02-14 MED ORDER — INSULIN ASPART 100 UNIT/ML IJ SOLN
0.0000 [IU] | Freq: Three times a day (TID) | INTRAMUSCULAR | Status: DC
  Administered 2022-02-15: 3 [IU] via SUBCUTANEOUS
  Administered 2022-02-15: 2 [IU] via SUBCUTANEOUS
  Filled 2022-02-14: qty 0.15

## 2022-02-14 NOTE — ED Provider Notes (Signed)
[facility]-EMERGENCY DEPT Provider Note   CSN: [id number] Arrival date & time: 02/14/22  6301     History  Chief Complaint  Patient presents with   Blood In Stools    Rebecca Barnes is a 76 y.o. female.  Patient with history of hypertension, diabetes, CVA, aphasia, and recent right hip fracture presents today from [facility] senior living with concerns for hematochezia. Was sent from facility with concerns for blood in stools that started today. Patient did have hip fracture with repair by [facility] on 6/13. Patient with significant aphasia per baseline from a stroke in 4/23. Patient is on anti-platelet therapy with aspirin and Plavix.  Level 5 caveat -- other, patient with aphasia from previous stroke  The history is provided by the patient. No language interpreter was used.       Home Medications Prior to Admission medications   Medication Sig Start Date End Date Taking? Authorizing Provider  acetaminophen (TYLENOL) 325 MG tablet Take 650 mg by mouth every 6 (six) hours as needed for mild pain or headache. 06/13/20   [provider]  amLODipine (NORVASC) 5 MG tablet TAKE 1 TABLET(5 MG) BY MOUTH DAILY Patient taking differently: Take 5 mg by mouth daily. 08/28/21   Ngetich, Donalee Citrin, NP  aspirin 81 MG chewable tablet Chew 1 tablet (81 mg total) by mouth 2 (two) times daily with a meal. 02/05/22 03/22/22  Hill, Alain Honey, PA-C  bisacodyl (DULCOLAX) 5 MG EC tablet Take 1 tablet (5 mg total) by mouth daily as needed for moderate constipation. 02/09/22   Pokhrel, Rebekah Chesterfield, MD  camphor-menthol [facility]) lotion Apply topically as needed for itching. Patient taking differently: Apply 1 application  topically as needed for itching. 12/11/21   Setzer, Lynnell Jude, PA-C  clonazePAM (KLONOPIN) 0.5 MG tablet Take 1 tablet (0.5 mg total) by mouth daily as needed for anxiety. 02/09/22 02/09/23  Pokhrel, Rebekah Chesterfield, MD  clopidogrel (PLAVIX) 75 MG tablet TAKE 1 TABLET(75 MG) BY MOUTH  DAILY Patient taking differently: Take 75 mg by mouth daily. 01/04/22   Ngetich, Dinah C, NP  docusate sodium (COLACE) 100 MG capsule Take 1 capsule (100 mg total) by mouth 2 (two) times daily. 02/09/22   Pokhrel, Rebekah Chesterfield, MD  feeding supplement (ENSURE ENLIVE / ENSURE PLUS) LIQD Take 237 mLs by mouth 2 (two) times daily between meals. 02/09/22   Pokhrel, Rebekah Chesterfield, MD  fenofibrate 54 MG tablet TAKE 1 TABLET(54 MG) BY MOUTH DAILY Patient taking differently: Take 54 mg by mouth daily. 01/04/22   Ngetich, Dinah C, NP  FLUoxetine (PROZAC) 40 MG capsule Take one capsule by mouth once daily. Patient taking differently: Take 40 mg by mouth daily. Take one capsule by mouth once daily. 01/04/22   Ngetich, Dinah C, NP  hydrOXYzine (ATARAX) 10 MG tablet TAKE 1 TABLET(10 MG) BY MOUTH EVERY 8 HOURS AS NEEDED FOR ANXIETY Patient taking differently: Take 10 mg by mouth 3 (three) times daily. 01/19/22   Ngetich, Dinah C, NP  losartan (COZAAR) 100 MG tablet TAKE 1 TABLET(100 MG) BY MOUTH DAILY Patient taking differently: Take 100 mg by mouth daily. 01/20/22   Ngetich, Dinah C, NP  melatonin 10 MG TABS Take 10 mg by mouth at bedtime. 12/11/21   Setzer, Lynnell Jude, PA-C  metFORMIN (GLUCOPHAGE) 500 MG tablet TAKE 1 TABLET(500 MG) BY MOUTH DAILY WITH BREAKFAST Patient taking differently: Take 500 mg by mouth daily with breakfast. 01/13/22   Ngetich, Dinah C, NP  ondansetron (ZOFRAN) 4 MG tablet Take 1 tablet (  4 mg total) by mouth every 6 (six) hours as needed for nausea. 02/09/22   Pokhrel, Rebekah Chesterfield, MD  oxymetazoline (AFRIN NASAL SPRAY) 0.05 % nasal spray Spray 1 spray into affected nostril if bleeding is uncontrolled after 20 min of direct pressure 12/18/21   Raynald Blend R, PA-C  QUEtiapine (SEROQUEL) 25 MG tablet Take 1 tablet (25 mg total) by mouth at bedtime as needed (agitation/sundowning). 02/09/22   Pokhrel, Laxman, MD  sodium chloride 1 g tablet TAKE 1 TABLET(1 GRAM) BY MOUTH THREE TIMES DAILY Patient taking differently:  Take 1 g by mouth 3 (three) times daily. 11/30/21   Ngetich, Dinah C, NP      Allergies    Januvia [sitagliptin], Statins, and Sulfa antibiotics    Review of Systems   Review of Systems  Unable to perform ROS: Other  Gastrointestinal:  Positive for blood in stool.  All other systems reviewed and are negative.   Physical Exam Updated Vital Signs BP (!) 129/55   Pulse 88   Temp 98.6 F (37 C) (Oral)   Resp 18   Ht 5\' 5"  (1.651 m)   Wt 102.1 kg   SpO2 100%   BMI 37.44 kg/m  Physical Exam Vitals and nursing note reviewed. Exam conducted with a chaperone present.  Constitutional:      General: She is not in acute distress.    Appearance: Normal appearance. She is normal weight. She is not ill-appearing, toxic-appearing or diaphoretic.  HENT:     Head: Normocephalic and atraumatic.  Cardiovascular:     Rate and Rhythm: Normal rate.  Pulmonary:     Effort: Pulmonary effort is normal. No respiratory distress.  Abdominal:     General: Abdomen is flat.     Palpations: Abdomen is soft.     Tenderness: There is abdominal tenderness.     Comments: Mild generalized tenderness to palpation of abdomen  Genitourinary:    Comments: Rectal exam reveals bright red blood with dark clots present in patients diaper and in the rectal vault. No hemorrhoids or other abnormalities visualized or palpated Musculoskeletal:        General: Normal range of motion.     Cervical back: Normal range of motion.     Comments: Right hip wound with large hematoma present, no erythema or warmth. Staples in place. No fluctuance or induration. No drainage. See images below for further  Skin:    General: Skin is warm and dry.  Neurological:     General: No focal deficit present.     Mental Status: She is alert.  Psychiatric:        Mood and Affect: Mood normal.        Behavior: Behavior normal.      ED Results / Procedures / Treatments   Labs (all labs ordered are listed, but only abnormal results  are displayed) Labs Reviewed  CBC WITH DIFFERENTIAL/PLATELET - Abnormal; Notable for the following components:      Result Value   RBC 3.18 (*)    Hemoglobin 8.2 (*)    HCT 26.1 (*)    MCH 25.8 (*)    Platelets 654 (*)    Abs Immature Granulocytes 0.09 (*)    All other components within normal limits  COMPREHENSIVE METABOLIC PANEL - Abnormal; Notable for the following components:   Glucose, Bld 152 (*)    Albumin 2.7 (*)    AST 13 (*)    All other components within normal limits  HEMOGLOBIN AND HEMATOCRIT, BLOOD -  Abnormal; Notable for the following components:   Hemoglobin 8.1 (*)    HCT 25.5 (*)    All other components within normal limits  APTT  PROTIME-INR  TYPE AND SCREEN    EKG None  Radiology CT ABDOMEN PELVIS W CONTRAST  Result Date: 02/14/2022 CLINICAL DATA:  Rectal bleeding.  Abdominal pain. EXAM: CT ABDOMEN AND PELVIS WITH CONTRAST TECHNIQUE: Multidetector CT imaging of the abdomen and pelvis was performed using the standard protocol following bolus administration of intravenous contrast. RADIATION DOSE REDUCTION: This exam was performed according to the departmental dose-optimization program which includes automated exposure control, adjustment of the mA and/or kV according to patient size and/or use of iterative reconstruction technique. CONTRAST:  OMNIPAQUE IOHEXOL 300 MG/ML  SOLN COMPARISON:  None Available. FINDINGS: Lower chest: Subsegmental atelectasis noted in the lung bases. Hepatobiliary: No suspicious focal abnormality within the liver parenchyma. Gallbladder is surgically absent. No intrahepatic or extrahepatic biliary dilation. Pancreas: No focal mass lesion. No dilatation of the main duct. No intraparenchymal cyst. No peripancreatic edema. Spleen: No splenomegaly. No focal mass lesion. Adrenals/Urinary Tract: No adrenal nodule or mass. Small cyst noted in both kidneys. 8 mm exophytic lesion lower pole left kidney (coronal 58/6) has attenuation too high to  be a simple cyst. No evidence for hydroureter. Bladder is nondistended although bladder wall appears thick and irregular. Stomach/Bowel: Stomach is unremarkable. No gastric wall thickening. No evidence of outlet obstruction. Duodenum is normally positioned as is the ligament of Treitz. No small bowel wall thickening. No small bowel dilatation. The terminal ileum is normal. The appendix is normal. No gross colonic mass. No colonic wall thickening. Diverticular changes are noted in the left colon without evidence of diverticulitis. Mild perirectal and presacral edema evident. Vascular/Lymphatic: There is mild atherosclerotic calcification of the abdominal aorta without aneurysm. There is no gastrohepatic or hepatoduodenal ligament lymphadenopathy. No retroperitoneal or mesenteric lymphadenopathy. No pelvic sidewall lymphadenopathy. Reproductive: Calcified fibroid seen in the uterus. There is no adnexal mass. Other: No intraperitoneal free fluid. Musculoskeletal: 10.2 x 10.6 x 4.7 cm heterogeneous collection of fluid is identified in the subcutaneous tissues anterolateral to the right hip, just deep to the staple line. Multiple gas bubbles are seen within the fluid. There is some fluid and trace gas in the hip musculature. Status post right hip replacement. No worrisome lytic or sclerotic osseous abnormality. IMPRESSION: 1. 10.2 x 10.6 x 4.7 cm heterogeneous collection of fluid in the subcutaneous tissues anterolateral to the right hip, just deep to the staple line. Multiple gas bubbles are seen within the fluid. Patient is status post hip replacement on 02/02/2022 and soft tissue gas is not expected beyond 7-10 days from surgery. Correlation for superinfection of a postoperative seroma or hematoma recommended. 2. No definite findings to account for the patient's rectal bleeding. There is some diverticular disease in the left colon and mild perirectal edema/inflammation is evident. 3. 8 mm exophytic lesion lower pole  left kidney has attenuation too high to be a simple cyst. This may be a cyst complicated by proteinaceous debris or hemorrhage but neoplasm could have this appearance. Consider follow-up MRI abdomen in 3 months to reassess. 4. Aortic Atherosclerosis (ICD10-I70.0). Electronically Signed   By: Kennith Center M.D.   On: 02/14/2022 09:15    Procedures Procedures    Medications Ordered in ED Medications  iohexol (OMNIPAQUE) 300 MG/ML solution 100 mL (100 mLs Intravenous Contrast Given 02/14/22 0838)  sodium chloride (PF) 0.9 % injection (  Given by Other 02/14/22  [id number])  sodium chloride (PF) 0.9 % injection (  Given by Other 02/14/22 0904)  LORazepam (ATIVAN) injection 0.5 mg (0.5 mg Intravenous Given 02/14/22 1234)  sodium chloride 0.9 % bolus 1,000 mL (1,000 mLs Intravenous New Bag/Given 02/14/22 1258)    ED Course/ Medical Decision Making/ A&P                           Medical Decision Making Amount and/or Complexity of Data Reviewed Labs: ordered. Radiology: ordered.  Risk Prescription drug management. Decision regarding hospitalization.   This patient presents to the ED for concern of GI bleed, this involves an extensive number of treatment options, and is a complaint that carries with it a high risk of complications and morbidity.   Co morbidities that complicate the patient evaluation  Hx CVA with aphasia   Additional history obtained:  Additional history obtained from previous ER notes Also discussed the patient with her daughter Denny Peonrin who is aware that the patient is in the hospital and is planning to come see her this afternoon. States that she is at her baseline mental status. Daughter updated on plan.  Lab Tests:  I Ordered, and personally interpreted labs.  The pertinent results include:  hemoglobin 8.2 --> 8.1 from 7.5 8 days ago.   Imaging Studies ordered:  I ordered imaging studies including CT abdomen pelvis with contrast  I independently visualized and  interpreted imaging which showed  1. 10.2 x 10.6 x 4.7 cm heterogeneous collection of fluid in the subcutaneous tissues anterolateral to the right hip, just deep to the staple line. Multiple gas bubbles are seen within the fluid. Patient is status post hip replacement on 02/02/2022 and soft tissue gas is not expected beyond 7-10 days from surgery. Correlation for superinfection of a postoperative seroma or hematoma recommended. 2. No definite findings to account for the patient's rectal bleeding. There is some diverticular disease in the left colon and mild perirectal edema/inflammation is evident. 3. 8 mm exophytic lesion lower pole left kidney has attenuation too high to be a simple cyst. This may be a cyst complicated by proteinaceous debris or hemorrhage but neoplasm could have this appearance. Consider follow-up MRI abdomen in 3 months to reassess. I agree with the radiologist interpretation   Consultations Obtained:  I requested consultation with the [facility] on call Dr. Shon BatonBrooks,  and discussed lab and imaging findings as well as pertinent plan - they recommend: suspect that CT findings are identifying a hematoma, no concern for abscess or infection at this time.  Also discussed patient with GI on call Dr. Dulce Sellarutlaw who requests CTA and admission and will see the patient in consult   Problem List / ED Course / Critical interventions / Medication management  I ordered medication including fluids  for 2 contrast studies, ativan and atarax for agitation  Reevaluation of the patient after these medicines showed that the patient improved I have reviewed the patients home medicines and have made adjustments as needed   Social Determinants of Health:  Patient nonverbal from SNF   Test / Admission - Considered:  Patient presents today for rectal bleeding. She has had 2 bloody bowel movements with clots since arriving here. Hemoglobin stable at this time. She is on aspirin and Plavix. Patient  will need admission for further evaluation and management of same.   Discussed patient with hospitalist who agrees to admit.    This is a shared visit with supervising physician Dr. Wallace CullensGray  who has independently evaluated patient & provided guidance in evaluation/management/disposition, in agreement with care    Final Clinical Impression(s) / ED Diagnoses Final diagnoses:  Gastrointestinal hemorrhage, unspecified gastrointestinal hemorrhage type    Rx / DC Orders ED Discharge Orders     None         Silva Bandy, PA-C 02/15/22 1959    Franne Forts, DO 02/18/22 [phone]

## 2022-02-14 NOTE — ED Triage Notes (Signed)
Pt BIB EMS from [facility] for passing clots through rectum. Per EMS there was no active bleeding and pt is possible on blood thinners. Normal baseline is slight confusion.  120/60 97% room air 87 hr

## 2022-02-14 NOTE — H&P (Signed)
History and Physical    PatientAveree Barnes ZOX:[id number] DOB: [birth year] DOA: 02/14/2022 DOS: the patient was seen and examined on 02/14/2022 PCP: Ngetich, Donalee Citrin, NP  Patient coming from: SNF  Chief Complaint:  Chief Complaint  Patient presents with   Blood In Stools   HPI: Rebecca Barnes is a 76 y.o. female with medical history significant of HTN, DM, CVA w/ right side residuals, expressive aphasia, DM2, HLD. Presenting with rectal bleeding. History is from her daughter. She is completing rehab at [facility]. Her caregivers noticed BRBPR and clots this morning. She is on anti-platelet therapy d/t previous stroke, but not on any anti-coagulation. Not trauma reported. There were no chest pain, lightheadedness, dizziness, shortness of breath symptoms reports. The facility became concerned and called for EMS.   Review of Systems: unable to review all systems due to the inability of the patient to answer questions. Past Medical History:  Diagnosis Date   Anxiety    Asthma    Diabetes mellitus without complication (HCC)    Hypertension    Stroke [facility])    Past Surgical History:  Procedure Laterality Date   arm fracture     arm surgery   CHOLECYSTECTOMY     TOTAL HIP ARTHROPLASTY Right 02/02/2022   Procedure: TOTAL HIP ARTHROPLASTY ANTERIOR APPROACH;  Surgeon: Samson Frederic, MD;  Location: [facility] OR;  Service: Orthopedics;  Laterality: Right;   Social History:  reports that she has never smoked. She has never used smokeless tobacco. She reports that she does not currently use drugs after having used the following drugs: Marijuana. She reports that she does not drink alcohol.  Allergies  Allergen Reactions   Januvia [Sitagliptin] Other (See Comments)    dizziness   Statins Other (See Comments)    Hospitalized on 2 occasions after trying statins. Patient became dizzy and increased fall risk   Sulfa Antibiotics Other (See Comments)    Childhood Allergy     Family History   Problem Relation Age of Onset   Cancer Mother     Prior to Admission medications   Medication Sig Start Date End Date Taking? Authorizing Provider  acetaminophen (TYLENOL) 325 MG tablet Take 650 mg by mouth every 6 (six) hours as needed for mild pain or headache. 06/13/20  Yes [provider]  amLODipine (NORVASC) 5 MG tablet TAKE 1 TABLET(5 MG) BY MOUTH DAILY Patient taking differently: Take 5 mg by mouth daily. 08/28/21  Yes Ngetich, Dinah C, NP  aspirin 81 MG chewable tablet Chew 1 tablet (81 mg total) by mouth 2 (two) times daily with a meal. 02/05/22 03/22/22 Yes Hill, Alain Honey, PA-C  bisacodyl (DULCOLAX) 5 MG EC tablet Take 1 tablet (5 mg total) by mouth daily as needed for moderate constipation. 02/09/22  Yes Pokhrel, Rebekah Chesterfield, MD  camphor-menthol [facility]) lotion Apply topically as needed for itching. Patient taking differently: Apply 1 application  topically as needed for itching. 12/11/21  Yes Setzer, Lynnell Jude, PA-C  clonazePAM (KLONOPIN) 0.5 MG tablet Take 1 tablet (0.5 mg total) by mouth daily as needed for anxiety. 02/09/22 02/09/23 Yes Pokhrel, Laxman, MD  clopidogrel (PLAVIX) 75 MG tablet TAKE 1 TABLET(75 MG) BY MOUTH DAILY Patient taking differently: Take 75 mg by mouth daily. 01/04/22  Yes Ngetich, Dinah C, NP  docusate sodium (COLACE) 100 MG capsule Take 1 capsule (100 mg total) by mouth 2 (two) times daily. 02/09/22  Yes Pokhrel, Laxman, MD  fenofibrate 54 MG tablet TAKE 1 TABLET(54 MG) BY MOUTH DAILY Patient taking  differently: Take 54 mg by mouth daily. 01/04/22  Yes Ngetich, Dinah C, NP  FLUoxetine (PROZAC) 40 MG capsule Take one capsule by mouth once daily. Patient taking differently: Take 40 mg by mouth daily. Take one capsule by mouth once daily. 01/04/22  Yes Ngetich, Dinah C, NP  hydrOXYzine (ATARAX) 10 MG tablet TAKE 1 TABLET(10 MG) BY MOUTH EVERY 8 HOURS AS NEEDED FOR ANXIETY Patient taking differently: Take 10 mg by mouth 3 (three) times daily. 01/19/22  Yes Ngetich,  Dinah C, NP  losartan (COZAAR) 100 MG tablet TAKE 1 TABLET(100 MG) BY MOUTH DAILY Patient taking differently: Take 100 mg by mouth daily. 01/20/22  Yes Ngetich, Dinah C, NP  melatonin 10 MG TABS Take 10 mg by mouth at bedtime. 12/11/21  Yes Setzer, Lynnell Jude, PA-C  metFORMIN (GLUCOPHAGE) 500 MG tablet TAKE 1 TABLET(500 MG) BY MOUTH DAILY WITH BREAKFAST Patient taking differently: Take 500 mg by mouth daily with breakfast. 01/13/22  Yes Ngetich, Dinah C, NP  ondansetron (ZOFRAN) 4 MG tablet Take 1 tablet (4 mg total) by mouth every 6 (six) hours as needed for nausea. 02/09/22  Yes Pokhrel, Rebekah Chesterfield, MD  oxymetazoline (AFRIN NASAL SPRAY) 0.05 % nasal spray Spray 1 spray into affected nostril if bleeding is uncontrolled after 20 min of direct pressure 12/18/21  Yes Conklin, Erica R, PA-C  sodium chloride 1 g tablet TAKE 1 TABLET(1 GRAM) BY MOUTH THREE TIMES DAILY Patient taking differently: Take 1 g by mouth 3 (three) times daily. 11/30/21  Yes Ngetich, Dinah C, NP  feeding supplement (ENSURE ENLIVE / ENSURE PLUS) LIQD Take 237 mLs by mouth 2 (two) times daily between meals. 02/09/22   Pokhrel, Rebekah Chesterfield, MD  QUEtiapine (SEROQUEL) 25 MG tablet Take 1 tablet (25 mg total) by mouth at bedtime as needed (agitation/sundowning). 02/09/22   Joycelyn Das, MD    Physical Exam: Vitals:   02/14/22 1100 02/14/22 1124 02/14/22 1230 02/14/22 1300  BP:  (!) 141/57 130/62 (!) 133/51  Pulse: 90 90 (!) 103 96  Resp: 16 11 16 16   Temp:      TempSrc:      SpO2: 99% 99% 97% 90%  Weight:      Height:       General: 76 y.o. female resting in bed in NAD Eyes: PERRL, normal sclera ENMT: Nares patent w/o discharge, orophaynx clear, dentition normal, ears w/o discharge/lesions/ulcers Neck: Supple, trachea midline Cardiovascular: RRR, +S1, S2, no g/r, 1/6 SEM equal pulses throughout Respiratory: CTABL, no w/r/r, normal WOB GI: BS+, NDNT, no masses noted, no organomegaly noted MSK: No e/c/c; bruising around right hip  surgical site; some TTP in that area, no drainage or erythema Neuro: tracks around room but does not participate in exam  Data Reviewed:  Na+  140 K+  3.6 Cl- 107 CO2  27 Glucose  152 BUN  16 Scr 0.62 WBC  9.4 Hgb  8.2 -> 8.1 Plt  654  CT ab/pelvis w/ contrast 1. 10.2 x 10.6 x 4.7 cm heterogeneous collection of fluid in the subcutaneous tissues anterolateral to the right hip, just deep to the staple line. Multiple gas bubbles are seen within the fluid. Patient is status post hip replacement on 02/02/2022 and soft tissue gas is not expected beyond 7-10 days from surgery. Correlation for superinfection of a postoperative seroma or hematoma recommended. 2. No definite findings to account for the patient's rectal bleeding. There is some diverticular disease in the left colon and mild perirectal edema/inflammation is evident. 3. 8 mm  exophytic lesion lower pole left kidney has attenuation too high to be a simple cyst. This may be a cyst complicated by proteinaceous debris or hemorrhage but neoplasm could have this appearance. Consider follow-up MRI abdomen in 3 months to reassess. 4. Aortic Atherosclerosis (ICD10-I70.0).  Assessment and Plan: GIB Normocytic anemia     - place in obs, tele     - here baseline Hgb is around 12.5 - 14     - recent admission for hip surgery (02/01/22); after her surgery her Hgb dropped to 7.5; this was her value at discharge     - today she is 8.1; will trend and transfuse for Hgb <7     - [facility] has been consulted, appreciate their assistance     - CTA ab/pelvis ordered   Right hip hematoma     - as seen on CT     - EDPA spoke with [facility] (Dr. Debbe Bales); no intervention needed at this time  DM2     - SSI, glucose checks, DM diet  HTN     - resume home regimen when confirmed  HLD     - resume home regimen when confirmed  Hx of CVA w/ right side residuals Asphasia     - hold anti-plt d/t bleed; resume home regimen otherwise when  confirmed  Anxiety     - continue home regimen when confirmed  Advance Care Planning:  Code Status: DNR  Consults: EDPA spoke with [facility] consulted  Family Communication: w/ daughter by phone  Severity of Illness: The appropriate patient status for this patient is OBSERVATION. Observation status is judged to be reasonable and necessary in order to provide the required intensity of service to ensure the patient's safety. The patient's presenting symptoms, physical exam findings, and initial radiographic and laboratory data in the context of their medical condition is felt to place them at decreased risk for further clinical deterioration. Furthermore, it is anticipated that the patient will be medically stable for discharge from the hospital within 2 midnights of admission.   Author: Teddy Spike, DO 02/14/2022 1:17 PM  For on call review [email or web address].

## 2022-02-14 NOTE — Consult Note (Signed)
[facility] Consultation Note  Referring Provider: [facility] Primary Care Physician:  Ngetich, Donalee Citrin, NP  Reason for Consultation:  hematochezia  HPI: Rebecca Barnes is a 76 y.o. female presenting hematochezia.  Resident nursing facility.  Reportedly had blood in diaper and sent here.  Patient with history of stroke and aphasic and non-verbal.  She had right hip fracture with repair last week.  Unable to obtain any further history.   Past Medical History:  Diagnosis Date   Anxiety    Asthma    Diabetes mellitus without complication (HCC)    Hypertension    Stroke [facility])     Past Surgical History:  Procedure Laterality Date   arm fracture     arm surgery   CHOLECYSTECTOMY     TOTAL HIP ARTHROPLASTY Right 02/02/2022   Procedure: TOTAL HIP ARTHROPLASTY ANTERIOR APPROACH;  Surgeon: Samson Frederic, MD;  Location: [facility] OR;  Service: Orthopedics;  Laterality: Right;    Prior to Admission medications   Medication Sig Start Date End Date Taking? Authorizing Provider  acetaminophen (TYLENOL) 325 MG tablet Take 650 mg by mouth every 6 (six) hours as needed for mild pain or headache. 06/13/20  Yes [provider]  amLODipine (NORVASC) 5 MG tablet TAKE 1 TABLET(5 MG) BY MOUTH DAILY Patient taking differently: Take 5 mg by mouth daily. 08/28/21  Yes Ngetich, Dinah C, NP  aspirin 81 MG chewable tablet Chew 1 tablet (81 mg total) by mouth 2 (two) times daily with a meal. 02/05/22 03/22/22 Yes Hill, Alain Honey, PA-C  bisacodyl (DULCOLAX) 5 MG EC tablet Take 1 tablet (5 mg total) by mouth daily as needed for moderate constipation. 02/09/22  Yes Pokhrel, Rebekah Chesterfield, MD  camphor-menthol [facility]) lotion Apply topically as needed for itching. Patient taking differently: Apply 1 application  topically as needed for itching. 12/11/21  Yes Setzer, Lynnell Jude, PA-C  clonazePAM (KLONOPIN) 0.5 MG tablet Take 1 tablet (0.5 mg total) by mouth daily as needed for anxiety. 02/09/22 02/09/23 Yes  Pokhrel, Laxman, MD  clopidogrel (PLAVIX) 75 MG tablet TAKE 1 TABLET(75 MG) BY MOUTH DAILY Patient taking differently: Take 75 mg by mouth daily. 01/04/22  Yes Ngetich, Dinah C, NP  docusate sodium (COLACE) 100 MG capsule Take 1 capsule (100 mg total) by mouth 2 (two) times daily. 02/09/22  Yes Pokhrel, Laxman, MD  fenofibrate 54 MG tablet TAKE 1 TABLET(54 MG) BY MOUTH DAILY Patient taking differently: Take 54 mg by mouth daily. 01/04/22  Yes Ngetich, Dinah C, NP  FLUoxetine (PROZAC) 40 MG capsule Take one capsule by mouth once daily. Patient taking differently: Take 40 mg by mouth daily. Take one capsule by mouth once daily. 01/04/22  Yes Ngetich, Dinah C, NP  hydrOXYzine (ATARAX) 10 MG tablet TAKE 1 TABLET(10 MG) BY MOUTH EVERY 8 HOURS AS NEEDED FOR ANXIETY Patient taking differently: Take 10 mg by mouth 3 (three) times daily. 01/19/22  Yes Ngetich, Dinah C, NP  losartan (COZAAR) 100 MG tablet TAKE 1 TABLET(100 MG) BY MOUTH DAILY Patient taking differently: Take 100 mg by mouth daily. 01/20/22  Yes Ngetich, Dinah C, NP  melatonin 10 MG TABS Take 10 mg by mouth at bedtime. 12/11/21  Yes Setzer, Lynnell Jude, PA-C  metFORMIN (GLUCOPHAGE) 500 MG tablet TAKE 1 TABLET(500 MG) BY MOUTH DAILY WITH BREAKFAST Patient taking differently: Take 500 mg by mouth daily with breakfast. 01/13/22  Yes Ngetich, Dinah C, NP  ondansetron (ZOFRAN) 4 MG tablet Take 1 tablet (4 mg total) by mouth every 6 (  six) hours as needed for nausea. 02/09/22  Yes Pokhrel, Rebekah Chesterfield, MD  oxymetazoline (AFRIN NASAL SPRAY) 0.05 % nasal spray Spray 1 spray into affected nostril if bleeding is uncontrolled after 20 min of direct pressure 12/18/21  Yes Conklin, Erica R, PA-C  sodium chloride 1 g tablet TAKE 1 TABLET(1 GRAM) BY MOUTH THREE TIMES DAILY Patient taking differently: Take 1 g by mouth 3 (three) times daily. 11/30/21  Yes Ngetich, Dinah C, NP  feeding supplement (ENSURE ENLIVE / ENSURE PLUS) LIQD Take 237 mLs by mouth 2 (two) times daily  between meals. 02/09/22   Pokhrel, Rebekah Chesterfield, MD  QUEtiapine (SEROQUEL) 25 MG tablet Take 1 tablet (25 mg total) by mouth at bedtime as needed (agitation/sundowning). 02/09/22   Pokhrel, Rebekah Chesterfield, MD    Current Facility-Administered Medications  Medication Dose Route Frequency Provider Last Rate Last Admin   hydrOXYzine (ATARAX) tablet 10 mg  10 mg Oral Once Smoot, Shawn Route, PA-C       Current Outpatient Medications  Medication Sig Dispense Refill   acetaminophen (TYLENOL) 325 MG tablet Take 650 mg by mouth every 6 (six) hours as needed for mild pain or headache.     amLODipine (NORVASC) 5 MG tablet TAKE 1 TABLET(5 MG) BY MOUTH DAILY (Patient taking differently: Take 5 mg by mouth daily.) 90 tablet 1   aspirin 81 MG chewable tablet Chew 1 tablet (81 mg total) by mouth 2 (two) times daily with a meal. 90 tablet 0   bisacodyl (DULCOLAX) 5 MG EC tablet Take 1 tablet (5 mg total) by mouth daily as needed for moderate constipation. 30 tablet 0   camphor-menthol (SARNA) lotion Apply topically as needed for itching. (Patient taking differently: Apply 1 application  topically as needed for itching.) 222 mL 0   clonazePAM (KLONOPIN) 0.5 MG tablet Take 1 tablet (0.5 mg total) by mouth daily as needed for anxiety. 10 tablet 0   clopidogrel (PLAVIX) 75 MG tablet TAKE 1 TABLET(75 MG) BY MOUTH DAILY (Patient taking differently: Take 75 mg by mouth daily.) 30 tablet 3   docusate sodium (COLACE) 100 MG capsule Take 1 capsule (100 mg total) by mouth 2 (two) times daily.     fenofibrate 54 MG tablet TAKE 1 TABLET(54 MG) BY MOUTH DAILY (Patient taking differently: Take 54 mg by mouth daily.) 30 tablet 3   FLUoxetine (PROZAC) 40 MG capsule Take one capsule by mouth once daily. (Patient taking differently: Take 40 mg by mouth daily. Take one capsule by mouth once daily.) 90 capsule 1   hydrOXYzine (ATARAX) 10 MG tablet TAKE 1 TABLET(10 MG) BY MOUTH EVERY 8 HOURS AS NEEDED FOR ANXIETY (Patient taking differently: Take 10 mg  by mouth 3 (three) times daily.) 90 tablet 1   losartan (COZAAR) 100 MG tablet TAKE 1 TABLET(100 MG) BY MOUTH DAILY (Patient taking differently: Take 100 mg by mouth daily.) 90 tablet 1   melatonin 10 MG TABS Take 10 mg by mouth at bedtime. 30 tablet 0   metFORMIN (GLUCOPHAGE) 500 MG tablet TAKE 1 TABLET(500 MG) BY MOUTH DAILY WITH BREAKFAST (Patient taking differently: Take 500 mg by mouth daily with breakfast.) 30 tablet 1   ondansetron (ZOFRAN) 4 MG tablet Take 1 tablet (4 mg total) by mouth every 6 (six) hours as needed for nausea.     oxymetazoline (AFRIN NASAL SPRAY) 0.05 % nasal spray Spray 1 spray into affected nostril if bleeding is uncontrolled after 20 min of direct pressure 30 mL 0   sodium chloride 1 g  tablet TAKE 1 TABLET(1 GRAM) BY MOUTH THREE TIMES DAILY (Patient taking differently: Take 1 g by mouth 3 (three) times daily.) 90 tablet 1   feeding supplement (ENSURE ENLIVE / ENSURE PLUS) LIQD Take 237 mLs by mouth 2 (two) times daily between meals.     QUEtiapine (SEROQUEL) 25 MG tablet Take 1 tablet (25 mg total) by mouth at bedtime as needed (agitation/sundowning).      Allergies as of 02/14/2022 - Review Complete 02/14/2022  Allergen Reaction Noted   Januvia [sitagliptin] Other (See Comments) 03/04/2015   Statins Other (See Comments) 05/22/2021   Sulfa antibiotics Other (See Comments) 03/04/2015    Family History  Problem Relation Age of Onset   Cancer Mother     Social History   Socioeconomic History   Marital status: Widowed    Spouse name: Not on file   Number of children: Not on file   Years of education: Not on file   Highest education level: Not on file  Occupational History   Occupation: Retired  Tobacco Use   Smoking status: Never   Smokeless tobacco: Never  Vaping Use   Vaping Use: Never used  Substance and Sexual Activity   Alcohol use: Never   Drug use: Not Currently    Types: Marijuana    Comment: ocassionally   Sexual activity: Not Currently   Other Topics Concern   Not on file  Social History Narrative   Not on file   Social Determinants of Health   Financial Resource Strain: Low Risk  (02/13/2020)   Overall Financial Resource Strain (CARDIA)    Difficulty of Paying Living Expenses: Not hard at all  Food Insecurity: No Food Insecurity (02/13/2020)   Hunger Vital Sign    Worried About Running Out of Food in the Last Year: Never true    Ran Out of Food in the Last Year: Never true  Transportation Needs: No Transportation Needs (02/13/2020)   PRAPARE - Administrator, Civil Service (Medical): No    Lack of Transportation (Non-Medical): No  Physical Activity: Inactive (02/13/2020)   Exercise Vital Sign    Days of Exercise per Week: 0 days    Minutes of Exercise per Session: 0 min  Stress: No Stress Concern Present (02/13/2020)   [employer] of Occupational Health - Occupational Stress Questionnaire    Feeling of Stress : Not at all  Social Connections: Socially Isolated (02/13/2020)   Social Connection and Isolation Panel [NHANES]    Frequency of Communication with Friends and Family: More than three times a week    Frequency of Social Gatherings with Friends and Family: Once a week    Attends Religious Services: Never    Database administrator or Organizations: No    Attends Banker Meetings: Never    Marital Status: Widowed  Intimate Partner Violence: Not At Risk (02/13/2020)   Humiliation, Afraid, Rape, and Kick questionnaire    Fear of Current or Ex-Partner: No    Emotionally Abused: No    Physically Abused: No    Sexually Abused: No    Review of Systems: Unable to obtain (patient not communicative)  Physical Exam: Vital signs in last 24 hours: Temp:  [98.6 F (37 C)] 98.6 F (37 C) (06/25 0702) Pulse Rate:  [77-103] 92 (06/25 1400) Resp:  [11-26] 16 (06/25 1338) BP: (123-157)/(51-82) 134/58 (06/25 1400) SpO2:  [90 %-100 %] 100 % (06/25 1400) Weight:  [102.1 kg] 102.1 kg (06/25  0657)  General:   Somnolent, won't open eyes, non-communicative Head:  Normocephalic and atraumatic. Eyes:  Sclera clear, no icterus.   Conjunctiva pink. Ears:  Normal auditory acuity. Nose:  No deformity, discharge,  or lesions. Mouth:  No deformity or lesions.  Oropharynx pink & moist. Neck:  Supple; no masses or thyromegaly. Abdomen:  Soft, nontender and nondistended. No masses, hepatosplenomegaly or hernias noted. Normal bowel sounds, without guarding, and without rebound.     Msk:  Symmetrical without gross deformities. Normal posture. Pulses:  Normal pulses noted. Extremities:  Without clubbing or edema. Neurologic:  Alert and  oriented x4;  grossly normal neurologically. Skin:  Pale, right hip surgical site with staples, warm and tender incision site, otherwise Intact without significant lesions or rashes. Psych: Neither alert nor communicative   Lab Results: Recent Labs    02/14/22 0740 02/14/22 1042  WBC 9.4  --   HGB 8.2* 8.1*  HCT 26.1* 25.5*  PLT 654*  --    BMET Recent Labs    02/14/22 0740  NA 140  K 3.6  CL 107  CO2 27  GLUCOSE 152*  BUN 16  CREATININE 0.62  CALCIUM 9.2   LFT Recent Labs    02/14/22 0740  PROT 6.6  ALBUMIN 2.7*  AST 13*  ALT 14  ALKPHOS 81  BILITOT 0.6   PT/INR Recent Labs    02/14/22 0730  LABPROT 14.4  INR 1.1    Studies/Results: CT Angio Abd/Pel W and/or Wo Contrast  Result Date: 02/14/2022 CLINICAL DATA:  GI bleed, hematochezia EXAM: CTA ABDOMEN AND PELVIS WITHOUT AND WITH CONTRAST TECHNIQUE: Multidetector CT imaging of the abdomen and pelvis was performed using the standard protocol during bolus administration of intravenous contrast. Multiplanar reconstructed images and MIPs were obtained and reviewed to evaluate the vascular anatomy. RADIATION DOSE REDUCTION: This exam was performed according to the departmental dose-optimization program which includes automated exposure control, adjustment of the mA and/or kV  according to patient size and/or use of iterative reconstruction technique. CONTRAST:  OMNIPAQUE IOHEXOL 350 MG/ML SOLN COMPARISON:  02/14/2022 9:15 a.m. FINDINGS: VASCULAR Normal contour and caliber of the abdominal aorta. No evidence of aneurysm, dissection, or other acute aortic pathology. Duplicated bilateral renal arteries, with otherwise standard branching pattern of the abdominal aorta. Mild mixed calcific atherosclerosis. Review of the MIP images confirms the above findings. NON-VASCULAR Lower chest: No acute abnormality.  Small hiatal hernia. Hepatobiliary: No focal liver abnormality is seen. Status post cholecystectomy. No biliary dilatation. Pancreas: Unremarkable. No pancreatic ductal dilatation or surrounding inflammatory changes. Spleen: Normal in size without significant abnormality. Adrenals/Urinary Tract: Adrenal glands are unremarkable. Small, benign bilateral renal cortical cysts, including an exophytic 0.8 cm lesion of the inferior pole of the left kidney consistent with a nonenhancing hemorrhagic or proteinaceous cyst. Kidneys are otherwise normal, without renal calculi, solid lesion, or hydronephrosis. Excreted contrast in the renal collecting systems and urinary bladder secondary to previous administration. Bladder is unremarkable. Stomach/Bowel: Stomach is within normal limits. Appendix appears normal. No evidence of bowel wall thickening, distention, or inflammatory changes. Descending and sigmoid diverticulosis without evidence of acute diverticulitis. No intraluminal contrast extravasation on multiphasic imaging. Vascular/Lymphatic: No significant vascular findings are present. No enlarged abdominal or pelvic lymph nodes. Reproductive: Uterine fibroids. Other: No abdominal wall hernia or abnormality. No ascites. Musculoskeletal: Status post right hip arthroplasty. Unchanged, large postoperative fluid collection overlying the right hip anteriorly, measuring 10.5 x 5.4 cm and  containing small locules of air (series 7, image 86). IMPRESSION:  1. No intraluminal contrast extravasation within the bowel on multiphasic imaging to localize reported GI bleed. 2. Diverticulosis without acute diverticulitis. 3. Unchanged, large postoperative fluid collection overlying the right hip anteriorly following right hip arthroplasty, measuring 10.5 x 5.4 cm and containing small locules of air. This may reflect bland seroma, however the presence or absence of infection is not established by CT. 4. Normal contour and caliber of the abdominal aorta. No evidence of aneurysm, dissection, or other acute aortic pathology. Mild mixed calcific atherosclerosis. 5. Small, benign bilateral renal cortical cysts, including a previously queried exophytic 0.8 cm lesion of the inferior pole of the left kidney consistent with a nonenhancing hemorrhagic or proteinaceous cyst. Further follow-up or characterization is required for these benign cysts. Aortic Atherosclerosis (ICD10-I70.0). Electronically Signed   By: Jearld Lesch M.D.   On: 02/14/2022 14:07   CT ABDOMEN PELVIS W CONTRAST  Result Date: 02/14/2022 CLINICAL DATA:  Rectal bleeding.  Abdominal pain. EXAM: CT ABDOMEN AND PELVIS WITH CONTRAST TECHNIQUE: Multidetector CT imaging of the abdomen and pelvis was performed using the standard protocol following bolus administration of intravenous contrast. RADIATION DOSE REDUCTION: This exam was performed according to the departmental dose-optimization program which includes automated exposure control, adjustment of the mA and/or kV according to patient size and/or use of iterative reconstruction technique. CONTRAST:  OMNIPAQUE IOHEXOL 300 MG/ML  SOLN COMPARISON:  None Available. FINDINGS: Lower chest: Subsegmental atelectasis noted in the lung bases. Hepatobiliary: No suspicious focal abnormality within the liver parenchyma. Gallbladder is surgically absent. No intrahepatic or extrahepatic biliary dilation.  Pancreas: No focal mass lesion. No dilatation of the main duct. No intraparenchymal cyst. No peripancreatic edema. Spleen: No splenomegaly. No focal mass lesion. Adrenals/Urinary Tract: No adrenal nodule or mass. Small cyst noted in both kidneys. 8 mm exophytic lesion lower pole left kidney (coronal 58/6) has attenuation too high to be a simple cyst. No evidence for hydroureter. Bladder is nondistended although bladder wall appears thick and irregular. Stomach/Bowel: Stomach is unremarkable. No gastric wall thickening. No evidence of outlet obstruction. Duodenum is normally positioned as is the ligament of Treitz. No small bowel wall thickening. No small bowel dilatation. The terminal ileum is normal. The appendix is normal. No gross colonic mass. No colonic wall thickening. Diverticular changes are noted in the left colon without evidence of diverticulitis. Mild perirectal and presacral edema evident. Vascular/Lymphatic: There is mild atherosclerotic calcification of the abdominal aorta without aneurysm. There is no gastrohepatic or hepatoduodenal ligament lymphadenopathy. No retroperitoneal or mesenteric lymphadenopathy. No pelvic sidewall lymphadenopathy. Reproductive: Calcified fibroid seen in the uterus. There is no adnexal mass. Other: No intraperitoneal free fluid. Musculoskeletal: 10.2 x 10.6 x 4.7 cm heterogeneous collection of fluid is identified in the subcutaneous tissues anterolateral to the right hip, just deep to the staple line. Multiple gas bubbles are seen within the fluid. There is some fluid and trace gas in the hip musculature. Status post right hip replacement. No worrisome lytic or sclerotic osseous abnormality. IMPRESSION: 1. 10.2 x 10.6 x 4.7 cm heterogeneous collection of fluid in the subcutaneous tissues anterolateral to the right hip, just deep to the staple line. Multiple gas bubbles are seen within the fluid. Patient is status post hip replacement on 02/02/2022 and soft tissue gas is  not expected beyond 7-10 days from surgery. Correlation for superinfection of a postoperative seroma or hematoma recommended. 2. No definite findings to account for the patient's rectal bleeding. There is some diverticular disease in the left colon and mild perirectal  edema/inflammation is evident. 3. 8 mm exophytic lesion lower pole left kidney has attenuation too high to be a simple cyst. This may be a cyst complicated by proteinaceous debris or hemorrhage but neoplasm could have this appearance. Consider follow-up MRI abdomen in 3 months to reassess. 4. Aortic Atherosclerosis (ICD10-I70.0). Electronically Signed   By: Kennith Center M.D.   On: 02/14/2022 09:15    Impression:   Recent hip fracture on right. Post-operative fluid collection right hip, hematoma vs seroma vs other. History stroke with aphasia.  Patient is currently non-communicative. Hematochezia.  Diverticular bleeding?   Chronic anticoagulation (clopidigrel).  Plan:   CT angiogram to assess source of bleeding, and consult IR if bleeding site identified. Anticoagulation on hold. Patient is not colonoscopy candidate at this time, and likely never will be. If CT angiogram negative, consider clear liquid diet. [facility] will follow.  Case reviewed with Hospitalist Team.   LOS: 0 days   Jaqulyn Chancellor M  02/14/2022, 2:14 PM  Cell [phone] If no answer or after 5 PM call [phone]

## 2022-02-15 ENCOUNTER — Other Ambulatory Visit: Payer: Self-pay

## 2022-02-15 DIAGNOSIS — K922 Gastrointestinal hemorrhage, unspecified: Secondary | ICD-10-CM | POA: Diagnosis not present

## 2022-02-15 LAB — COMPREHENSIVE METABOLIC PANEL
ALT: 14 U/L (ref 0–44)
AST: 15 U/L (ref 15–41)
Albumin: 3 g/dL — ABNORMAL LOW (ref 3.5–5.0)
Alkaline Phosphatase: 85 U/L (ref 38–126)
Anion gap: 8 (ref 5–15)
BUN: 12 mg/dL (ref 8–23)
CO2: 25 mmol/L (ref 22–32)
Calcium: 8.8 mg/dL — ABNORMAL LOW (ref 8.9–10.3)
Chloride: 105 mmol/L (ref 98–111)
Creatinine, Ser: 0.51 mg/dL (ref 0.44–1.00)
GFR, Estimated: >60 mL/min (ref >60)
Glucose, Bld: 123 mg/dL — ABNORMAL HIGH (ref 70–99)
Potassium: 3.4 mmol/L — ABNORMAL LOW (ref 3.5–5.1)
Sodium: 138 mmol/L (ref 135–145)
Total Bilirubin: 0.7 mg/dL (ref 0.3–1.2)
Total Protein: 6.6 g/dL (ref 6.5–8.1)

## 2022-02-15 LAB — GLUCOSE, CAPILLARY
Glucose-Capillary: 124 mg/dL — ABNORMAL HIGH (ref 70–99)
Glucose-Capillary: 153 mg/dL — ABNORMAL HIGH (ref 70–99)
Glucose-Capillary: 128 mg/dL — ABNORMAL HIGH (ref 70–99)

## 2022-02-15 LAB — CBC
HCT: 26.2 % — ABNORMAL LOW (ref 36.0–46.0)
Hemoglobin: 8 g/dL — ABNORMAL LOW (ref 12.0–15.0)
MCH: 25.5 pg — ABNORMAL LOW (ref 26.0–34.0)
MCHC: 30.5 g/dL (ref 30.0–36.0)
MCV: 83.4 fL (ref 80.0–100.0)
Platelets: 602 10*3/uL — ABNORMAL HIGH (ref 150–400)
RBC: 3.14 MIL/uL — ABNORMAL LOW (ref 3.87–5.11)
RDW: 15 % (ref 11.5–15.5)
WBC: 10.4 10*3/uL (ref 4.0–10.5)
nRBC: 0 % (ref 0.0–0.2)

## 2022-02-15 MED ORDER — ASPIRIN 81 MG PO CHEW
81.0000 mg | CHEWABLE_TABLET | Freq: Two times a day (BID) | ORAL | 0 refills | Status: DC
Start: 2022-02-21 — End: 2022-02-26

## 2022-02-15 MED ORDER — MELATONIN 5 MG PO TABS: 10.0000 mg | ORAL_TABLET | Freq: Every day | ORAL | Status: DC

## 2022-02-15 MED ORDER — OXYCODONE HCL 5 MG PO TABS: 5.0000 mg | ORAL_TABLET | ORAL | 0 refills | Status: DC | PRN

## 2022-02-15 MED ORDER — DOCUSATE SODIUM 100 MG PO CAPS
100.0000 mg | ORAL_CAPSULE | Freq: Two times a day (BID) | ORAL | Status: DC
  Administered 2022-02-15: 100 mg via ORAL
  Filled 2022-02-15: qty 1

## 2022-02-15 MED ORDER — LOSARTAN POTASSIUM 50 MG PO TABS
100.0000 mg | ORAL_TABLET | Freq: Every morning | ORAL | Status: DC
  Administered 2022-02-15: 100 mg via ORAL
  Filled 2022-02-15: qty 2

## 2022-02-15 MED ORDER — POTASSIUM CHLORIDE CRYS ER 20 MEQ PO TBCR
40.0000 meq | EXTENDED_RELEASE_TABLET | Freq: Once | ORAL | Status: AC
  Administered 2022-02-15: 40 meq via ORAL
  Filled 2022-02-15: qty 2

## 2022-02-15 MED ORDER — CLONAZEPAM 0.5 MG PO TABS: 0.5000 mg | ORAL_TABLET | Freq: Every day | ORAL | 0 refills | Status: DC | PRN

## 2022-02-15 MED ORDER — CLOPIDOGREL BISULFATE 75 MG PO TABS: 75.0000 mg | ORAL_TABLET | Freq: Every day | ORAL | 3 refills | Status: DC

## 2022-02-15 MED ORDER — FLUOXETINE HCL 20 MG PO CAPS
40.0000 mg | ORAL_CAPSULE | Freq: Every day | ORAL | Status: DC
  Administered 2022-02-15: 40 mg via ORAL
  Filled 2022-02-15: qty 2

## 2022-02-15 MED ORDER — CLONAZEPAM 0.5 MG PO TABS
0.5000 mg | ORAL_TABLET | Freq: Every day | ORAL | Status: DC | PRN
  Administered 2022-02-15: 0.5 mg via ORAL
  Filled 2022-02-15: qty 1

## 2022-02-15 MED ORDER — AMLODIPINE BESYLATE 5 MG PO TABS
5.0000 mg | ORAL_TABLET | Freq: Every morning | ORAL | Status: DC
  Administered 2022-02-15: 5 mg via ORAL
  Filled 2022-02-15: qty 1

## 2022-02-15 MED ORDER — FENOFIBRATE 54 MG PO TABS
54.0000 mg | ORAL_TABLET | Freq: Every morning | ORAL | Status: DC
  Administered 2022-02-15: 54 mg via ORAL
  Filled 2022-02-15: qty 1

## 2022-02-15 MED ORDER — QUETIAPINE FUMARATE 25 MG PO TABS: 25.0000 mg | ORAL_TABLET | Freq: Every evening | ORAL | Status: DC | PRN

## 2022-02-15 NOTE — NC FL2 (Signed)
MEDICAID FL2 LEVEL OF CARE SCREENING TOOL     IDENTIFICATION  Patient Name: Rebecca Barnes Birthdate: 13-Dec-1945 Sex: female Admission Date (Current Location): 02/14/2022  [facility] and [place] Number:  Producer, television/film/video and Address:  [facility],  [address], [place] [address]      Provider Number: [id number]  Attending Physician Name and Address:  Hughie Closs, MD  Relative Name and Phone Number:   [name] [phone])    Current Level of Care: Hospital Recommended Level of Care: Skilled Nursing Facility Prior Approval Number:    Date Approved/Denied:   PASRR Number:    Discharge Plan: SNF    Current Diagnoses: Patient Active Problem List   Diagnosis Date Noted   GIB (gastrointestinal bleeding) 02/14/2022   Hematoma of right hip 02/14/2022   Normocytic anemia 02/14/2022   Agitation 02/06/2022   Hip fracture (HCC) 02/01/2022   DNR (do not resuscitate) 02/01/2022   Acute cerebral infarction (HCC) 12/06/2021   Aphasia    HLD (hyperlipidemia)    Expressive aphasia 12/03/2021   Obesity (BMI 30-39.9) 12/03/2021   History of marijuana use 12/03/2021   Stroke-like symptoms 12/02/2021   Candidiasis of female genitalia 03/20/2021   History of CVA (cerebrovascular accident) 03/20/2021   Hyponatremia 03/14/2021   Hypokalemia 03/14/2021   Thrombocytosis 03/14/2021   Polycythemia 03/14/2021   Late effect of CVA (RLE weakness) 03/14/2021   Essential hypertension 09/18/2019   Anxiety with depression 06/07/2019   Type 2 diabetes mellitus without complication, without long-term current use of insulin (HCC) 06/07/2019   Disturbance of skin sensation 06/07/2019   Leukocytosis 06/07/2019   Secondary DM with peripheral vascular disease (HCC) 06/07/2019   Generalized weakness 06/07/2019   Anxiety 03/29/2007    Orientation RESPIRATION BLADDER Height & Weight     Self, Time, Situation  Normal Incontinent Weight: 102.1 kg Height:  5'  5" (165.1 cm)  BEHAVIORAL SYMPTOMS/MOOD NEUROLOGICAL BOWEL NUTRITION STATUS      Incontinent Diet (CHO MOD)  AMBULATORY STATUS COMMUNICATION OF NEEDS Skin   Total Care Verbally Normal                       Personal Care Assistance Level of Assistance  Bathing, Feeding, Dressing Bathing Assistance: Maximum assistance Feeding assistance: Maximum assistance Dressing Assistance: Maximum assistance     Functional Limitations Info  Sight, Hearing, Speech Sight Info: Adequate Hearing Info: Adequate Speech Info: Adequate    SPECIAL CARE FACTORS FREQUENCY  PT (By licensed PT), OT (By licensed OT)     PT Frequency:  (5x week) OT Frequency:  (5x week)            Contractures Contractures Info: Not present    Additional Factors Info  Code Status, Allergies Code Status Info:  (DNR) Allergies Info:  (Januvia (Sitagliptin), Statins, Sulfa Antibiotics)           Current Medications (02/15/2022):  This is the current hospital active medication list Current Facility-Administered Medications  Medication Dose Route Frequency Provider Last Rate Last Admin   acetaminophen (TYLENOL) tablet 650 mg  650 mg Oral Q6H PRN Ronaldo Miyamoto, Tyrone A, DO       Or   acetaminophen (TYLENOL) suppository 650 mg  650 mg Rectal Q6H PRN Ronaldo Miyamoto, Tyrone A, DO       amLODipine (NORVASC) tablet 5 mg  5 mg Oral q morning Hughie Closs, MD   5 mg at 02/15/22 0916   clonazePAM (KLONOPIN) tablet 0.5 mg  0.5 mg Oral Daily PRN Hughie Closs, MD   0.5 mg at 02/15/22 1345   docusate sodium (COLACE) capsule 100 mg  100 mg Oral BID Hughie Closs, MD   100 mg at 02/15/22 0916   fenofibrate tablet 54 mg  54 mg Oral q morning Hughie Closs, MD   54 mg at 02/15/22 1028   FLUoxetine (PROZAC) capsule 40 mg  40 mg Oral Daily Hughie Closs, MD   40 mg at 02/15/22 0915   insulin aspart (novoLOG) injection 0-15 Units  0-15 Units Subcutaneous TID WC Kyle, Tyrone A, DO   3 Units at 02/15/22 1300   insulin aspart (novoLOG) injection  0-5 Units  0-5 Units Subcutaneous QHS Ronaldo Miyamoto, Tyrone A, DO       LORazepam (ATIVAN) injection 0.5 mg  0.5 mg Intravenous Q8H PRN Ronaldo Miyamoto, Tyrone A, DO   0.5 mg at 02/15/22 0827   losartan (COZAAR) tablet 100 mg  100 mg Oral q morning Hughie Closs, MD   100 mg at 02/15/22 0915   melatonin tablet 10 mg  10 mg Oral QHS Hughie Closs, MD       ondansetron (ZOFRAN) tablet 4 mg  4 mg Oral Q6H PRN Ronaldo Miyamoto, Tyrone A, DO       Or   ondansetron (ZOFRAN) injection 4 mg  4 mg Intravenous Q6H PRN Ronaldo Miyamoto, Tyrone A, DO       oxyCODONE (Oxy IR/ROXICODONE) immediate release tablet 5 mg  5 mg Oral Q4H PRN Ronaldo Miyamoto, Tyrone A, DO   5 mg at 02/15/22 1210   QUEtiapine (SEROQUEL) tablet 25 mg  25 mg Oral QHS PRN Hughie Closs, MD         Discharge Medications: Please see discharge summary for a list of discharge medications.  Relevant Imaging Results:  Relevant Lab Results:   Additional Information  (SS#[id number] [phone])  Lucille Crichlow, Olegario Messier, RN

## 2022-02-17 ENCOUNTER — Ambulatory Visit: Payer: Medicare Other | Admitting: Neurology

## 2022-02-22 ENCOUNTER — Inpatient Hospital Stay (HOSPITAL_COMMUNITY)
Admission: EM | Admit: 2022-02-22 | Discharge: 2022-02-26 | DRG: 467 | Disposition: A | Discharge status: Skilled Nursing Facility | Payer: Medicare Other | Source: Skilled Nursing Facility | Attending: Student | Admitting: Student

## 2022-02-22 ENCOUNTER — Emergency Department (HOSPITAL_COMMUNITY): Payer: Medicare Other

## 2022-02-22 ENCOUNTER — Other Ambulatory Visit (HOSPITAL_COMMUNITY): Payer: Self-pay | Admitting: Radiology

## 2022-02-22 DIAGNOSIS — F418 Other specified anxiety disorders: Secondary | ICD-10-CM | POA: Diagnosis present

## 2022-02-22 DIAGNOSIS — Z7902 Long term (current) use of antithrombotics/antiplatelets: Secondary | ICD-10-CM

## 2022-02-22 DIAGNOSIS — E669 Obesity, unspecified: Secondary | ICD-10-CM | POA: Diagnosis present

## 2022-02-22 DIAGNOSIS — I69320 Aphasia following cerebral infarction: Secondary | ICD-10-CM

## 2022-02-22 DIAGNOSIS — M9701XA Periprosthetic fracture around internal prosthetic right hip joint, initial encounter: Secondary | ICD-10-CM | POA: Diagnosis present

## 2022-02-22 DIAGNOSIS — W19XXXA Unspecified fall, initial encounter: Secondary | ICD-10-CM | POA: Insufficient documentation

## 2022-02-22 DIAGNOSIS — Z9049 Acquired absence of other specified parts of digestive tract: Secondary | ICD-10-CM

## 2022-02-22 DIAGNOSIS — E785 Hyperlipidemia, unspecified: Secondary | ICD-10-CM | POA: Diagnosis present

## 2022-02-22 DIAGNOSIS — R4701 Aphasia: Secondary | ICD-10-CM | POA: Diagnosis present

## 2022-02-22 DIAGNOSIS — Z6835 Body mass index (BMI) 35.0-35.9, adult: Secondary | ICD-10-CM

## 2022-02-22 DIAGNOSIS — D72825 Bandemia: Secondary | ICD-10-CM

## 2022-02-22 DIAGNOSIS — E538 Deficiency of other specified B group vitamins: Secondary | ICD-10-CM | POA: Diagnosis present

## 2022-02-22 DIAGNOSIS — E876 Hypokalemia: Secondary | ICD-10-CM | POA: Diagnosis present

## 2022-02-22 DIAGNOSIS — S72331A Displaced oblique fracture of shaft of right femur, initial encounter for closed fracture: Secondary | ICD-10-CM | POA: Diagnosis not present

## 2022-02-22 DIAGNOSIS — Z888 Allergy status to other drugs, medicaments and biological substances status: Secondary | ICD-10-CM

## 2022-02-22 DIAGNOSIS — Z66 Do not resuscitate: Secondary | ICD-10-CM | POA: Diagnosis present

## 2022-02-22 DIAGNOSIS — J45909 Unspecified asthma, uncomplicated: Secondary | ICD-10-CM | POA: Diagnosis present

## 2022-02-22 DIAGNOSIS — F0394 Unspecified dementia, unspecified severity, with anxiety: Secondary | ICD-10-CM | POA: Diagnosis present

## 2022-02-22 DIAGNOSIS — Y92129 Unspecified place in nursing home as the place of occurrence of the external cause: Secondary | ICD-10-CM | POA: Insufficient documentation

## 2022-02-22 DIAGNOSIS — Z8719 Personal history of other diseases of the digestive system: Secondary | ICD-10-CM

## 2022-02-22 DIAGNOSIS — Z882 Allergy status to sulfonamides status: Secondary | ICD-10-CM

## 2022-02-22 DIAGNOSIS — I10 Essential (primary) hypertension: Secondary | ICD-10-CM | POA: Diagnosis present

## 2022-02-22 DIAGNOSIS — F419 Anxiety disorder, unspecified: Secondary | ICD-10-CM | POA: Diagnosis present

## 2022-02-22 DIAGNOSIS — Z96649 Presence of unspecified artificial hip joint: Secondary | ICD-10-CM

## 2022-02-22 DIAGNOSIS — D649 Anemia, unspecified: Secondary | ICD-10-CM | POA: Diagnosis present

## 2022-02-22 DIAGNOSIS — Z7982 Long term (current) use of aspirin: Secondary | ICD-10-CM

## 2022-02-22 DIAGNOSIS — W06XXXA Fall from bed, initial encounter: Secondary | ICD-10-CM | POA: Diagnosis present

## 2022-02-22 DIAGNOSIS — Z79899 Other long term (current) drug therapy: Secondary | ICD-10-CM

## 2022-02-22 DIAGNOSIS — M978XXA Periprosthetic fracture around other internal prosthetic joint, initial encounter: Principal | ICD-10-CM

## 2022-02-22 DIAGNOSIS — M25551 Pain in right hip: Secondary | ICD-10-CM | POA: Diagnosis not present

## 2022-02-22 DIAGNOSIS — D509 Iron deficiency anemia, unspecified: Secondary | ICD-10-CM | POA: Diagnosis present

## 2022-02-22 DIAGNOSIS — Z20822 Contact with and (suspected) exposure to covid-19: Secondary | ICD-10-CM | POA: Diagnosis present

## 2022-02-22 DIAGNOSIS — Y92122 Bedroom in nursing home as the place of occurrence of the external cause: Secondary | ICD-10-CM

## 2022-02-22 DIAGNOSIS — D72829 Elevated white blood cell count, unspecified: Secondary | ICD-10-CM | POA: Diagnosis present

## 2022-02-22 DIAGNOSIS — E1165 Type 2 diabetes mellitus with hyperglycemia: Secondary | ICD-10-CM | POA: Diagnosis present

## 2022-02-22 DIAGNOSIS — F0393 Unspecified dementia, unspecified severity, with mood disturbance: Secondary | ICD-10-CM | POA: Diagnosis present

## 2022-02-22 DIAGNOSIS — D62 Acute posthemorrhagic anemia: Secondary | ICD-10-CM | POA: Diagnosis not present

## 2022-02-22 DIAGNOSIS — E119 Type 2 diabetes mellitus without complications: Secondary | ICD-10-CM

## 2022-02-22 DIAGNOSIS — Z8673 Personal history of transient ischemic attack (TIA), and cerebral infarction without residual deficits: Secondary | ICD-10-CM

## 2022-02-22 LAB — I-STAT CHEM 8, ED
BUN: 11 mg/dL (ref 8–23)
Calcium, Ion: 1.14 mmol/L — ABNORMAL LOW (ref 1.15–1.40)
Chloride: 106 mmol/L (ref 98–111)
Creatinine, Ser: 0.4 mg/dL — ABNORMAL LOW (ref 0.44–1.00)
Glucose, Bld: 164 mg/dL — ABNORMAL HIGH (ref 70–99)
HCT: 23 % — ABNORMAL LOW (ref 36.0–46.0)
Hemoglobin: 7.8 g/dL — ABNORMAL LOW (ref 12.0–15.0)
Potassium: 3.4 mmol/L — ABNORMAL LOW (ref 3.5–5.1)
Sodium: 138 mmol/L (ref 135–145)
TCO2: 22 mmol/L (ref 22–32)

## 2022-02-22 NOTE — ED Provider Notes (Signed)
[facility] Provider Note   CSN: [id number] Arrival date & time: 02/22/22  2316     History {Add pertinent medical, surgical, social   Chief complaint - fall  Level 5 caveat due to altered mental status Rebecca Barnes is a 76 y.o. female.  HPI Patient presents via EMS after a fall.  Patient presents for nursing facility and she is unable to provide any history It is  reported patient fell and had right hip pain.  EMS reports that she was found in bed on their arrival.  No obvious signs of head trauma but patient is on "blood thinners " Patient was immediately activated a level 2 trauma No other details on arrival    Home Medications Prior to Admission medications   Medication Sig Start Date End Date Taking? Authorizing Provider  acetaminophen (TYLENOL) 325 MG tablet Take 650 mg by mouth every 6 (six) hours as needed for mild pain or headache. 06/13/20   [provider]  amLODipine (NORVASC) 5 MG tablet TAKE 1 TABLET(5 MG) BY MOUTH DAILY Patient taking differently: Take 5 mg by mouth every morning. 08/28/21   Ngetich, Donalee Citrininah C, NP  aspirin 81 MG chewable tablet Chew 1 tablet (81 mg total) by mouth 2 (two) times daily with a meal. 02/21/22 04/07/22  Hughie ClossPahwani, Ravi, MD  bisacodyl (DULCOLAX) 5 MG EC tablet Take 1 tablet (5 mg total) by mouth daily as needed for moderate constipation. 02/09/22   Pokhrel, Rebekah ChesterfieldLaxman, MD  camphor-menthol [facility](SARNA) lotion Apply topically as needed for itching. Patient taking differently: Apply 1 application  topically daily as needed for itching. 12/11/21   Setzer, Lynnell JudeSandra J, PA-C  clonazePAM (KLONOPIN) 0.5 MG tablet Take 1 tablet (0.5 mg total) by mouth daily as needed for anxiety. 02/15/22 02/15/23  Hughie ClossPahwani, Ravi, MD  clopidogrel (PLAVIX) 75 MG tablet Take 1 tablet (75 mg total) by mouth daily. 02/19/22   Hughie ClossPahwani, Ravi, MD  docusate sodium (COLACE) 100 MG capsule Take 1 capsule (100 mg total) by mouth 2 (two) times daily.  02/09/22   Pokhrel, Rebekah ChesterfieldLaxman, MD  fenofibrate 54 MG tablet TAKE 1 TABLET(54 MG) BY MOUTH DAILY Patient taking differently: Take 54 mg by mouth every morning. 01/04/22   Ngetich, Dinah C, NP  FLUoxetine (PROZAC) 40 MG capsule Take one capsule by mouth once daily. Patient taking differently: Take 40 mg by mouth daily. Take one capsule by mouth once daily. 01/04/22   Ngetich, Dinah C, NP  hydrOXYzine (ATARAX) 10 MG tablet TAKE 1 TABLET(10 MG) BY MOUTH EVERY 8 HOURS AS NEEDED FOR ANXIETY Patient taking differently: Take 10 mg by mouth every 8 (eight) hours as needed for anxiety. 01/19/22   Ngetich, Dinah C, NP  losartan (COZAAR) 100 MG tablet TAKE 1 TABLET(100 MG) BY MOUTH DAILY Patient taking differently: Take 100 mg by mouth every morning. 01/20/22   Ngetich, Dinah C, NP  melatonin 10 MG TABS Take 10 mg by mouth at bedtime. 12/11/21   Setzer, Lynnell JudeSandra J, PA-C  metFORMIN (GLUCOPHAGE) 500 MG tablet TAKE 1 TABLET(500 MG) BY MOUTH DAILY WITH BREAKFAST Patient taking differently: Take 500 mg by mouth daily with breakfast. 01/13/22   Ngetich, Dinah C, NP  Nutritional Supplements (NUTRITIONAL SUPPLEMENT PO) Take 120 mLs by mouth 2 (two) times daily. [facility]    [provider]  ondansetron (ZOFRAN) 4 MG tablet Take 1 tablet (4 mg total) by mouth every 6 (six) hours as needed for nausea. 02/09/22   Joycelyn DasPokhrel, Laxman, MD  oxyCODONE (  OXY IR/ROXICODONE) 5 MG immediate release tablet Take 1 tablet (5 mg total) by mouth every 4 (four) hours as needed for severe pain or moderate pain. 02/15/22   Hughie Closs, MD  oxymetazoline (AFRIN NASAL SPRAY) 0.05 % nasal spray Spray 1 spray into affected nostril if bleeding is uncontrolled after 20 min of direct pressure Patient taking differently: Place 1 spray into both nostrils See admin instructions. Spray 1 spray into affected nostril as needed for nose bleed -  if bleeding is uncontrolled after 20 min of direct pressure 12/18/21   Raynald Blend R, PA-C  QUEtiapine (SEROQUEL)  25 MG tablet Take 1 tablet (25 mg total) by mouth at bedtime as needed (agitation/sundowning). Patient not taking: Reported on 02/15/2022 02/09/22   Pokhrel, Rebekah Chesterfield, MD  sodium chloride 1 g tablet TAKE 1 TABLET(1 GRAM) BY MOUTH THREE TIMES DAILY Patient taking differently: Take 1 g by mouth 3 (three) times daily. 11/30/21   Ngetich, Donalee Citrin, NP      Allergies    Januvia [sitagliptin], Statins, and Sulfa antibiotics    Review of Systems   Review of Systems  Unable to perform ROS: Mental status change    Physical Exam Updated Vital Signs BP 127/69   Pulse 85   Temp 97.6 F (36.4 C) (Oral)   Resp 14   SpO2 95%  Physical Exam CONSTITUTIONAL: Elderly, ill-appearing, confused HEAD: Normocephalic/atraumatic, no visible trauma EYES: EOMI ENMT: Mucous membranes moist NECK: supple no meningeal signs SPINE/BACK:entire spine nontender, no bruising/crepitance/stepoffs noted to spine CV: S1/S2 noted LUNGS: Lungs are clear to auscultation bilaterally, no apparent distress ABDOMEN: soft, nontender NEURO: Pt is awake/alert but appears confused and has apparently an expressive aphasia EXTREMITIES: Right lower extremity is shortened and externally rotated, incision is noted on the proximal right thigh.  See photo below Tenderness noted to right knee All other extremities/joints palpated/ranged and nontender Distal pulses intact SKIN: See photo below PSYCH: Unable to assess     ED Results / Procedures / Treatments   Labs (all labs ordered are listed, but only abnormal results are displayed) Labs Reviewed  COMPREHENSIVE METABOLIC PANEL - Abnormal; Notable for the following components:      Result Value   Potassium 3.4 (*)    Glucose, Bld 164 (*)    Calcium 8.8 (*)    Total Protein 5.9 (*)    Albumin 2.7 (*)    All other components within normal limits  CBC - Abnormal; Notable for the following components:   WBC 12.9 (*)    RBC 2.86 (*)    Hemoglobin 7.4 (*)    HCT 23.8 (*)    MCH  25.9 (*)    Platelets 554 (*)    All other components within normal limits  I-STAT CHEM 8, ED - Abnormal; Notable for the following components:   Potassium 3.4 (*)    Creatinine, Ser 0.40 (*)    Glucose, Bld 164 (*)    Calcium, Ion 1.14 (*)    Hemoglobin 7.8 (*)    HCT 23.0 (*)    All other components within normal limits  RESP PANEL BY RT-PCR (FLU A&B, COVID) ARPGX2  ETHANOL  PROTIME-INR  URINALYSIS, ROUTINE W REFLEX MICROSCOPIC    EKG EKG Interpretation  Date/Time:  Tuesday February 23 2022 03:02:07 EDT Ventricular Rate:  90 PR Interval:  175 QRS Duration: 101 QT Interval:  354 QTC Calculation: 434 R Axis:   55 Text Interpretation: Sinus rhythm Consider left atrial enlargement No significant change since last  tracing Confirmed by Zadie Rhine ([id number]) on 02/23/2022 3:08:15 AM  Radiology DG Femur Portable Min 2 Views Right  Result Date: 02/23/2022 CLINICAL DATA:  Recent fall with known femoral fracture EXAM: RIGHT FEMUR PORTABLE 2 VIEW COMPARISON:  Films from the previous day. FINDINGS: Comminuted periprosthetic femoral fracture is noted. Angulation at the fracture site is seen. No other focal abnormality is noted. IMPRESSION: Prosthetic right femoral fracture. Electronically Signed   By: Alcide Clever M.D.   On: 02/23/2022 01:20   CT HEAD WO CONTRAST  Result Date: 02/23/2022 CLINICAL DATA:  Polytrauma, blunt.  Unwitnessed fall. EXAM: CT HEAD WITHOUT CONTRAST CT CERVICAL SPINE WITHOUT CONTRAST TECHNIQUE: Multidetector CT imaging of the head and cervical spine was performed following the standard protocol without intravenous contrast. Multiplanar CT image reconstructions of the cervical spine were also generated. RADIATION DOSE REDUCTION: This exam was performed according to the departmental dose-optimization program which includes automated exposure control, adjustment of the mA and/or kV according to patient size and/or use of iterative reconstruction technique. COMPARISON:   02/01/2022. FINDINGS: CT HEAD FINDINGS Brain: No acute intracranial hemorrhage, midline shift or mass effect. No extra-axial fluid collection. Mild generalized atrophy is noted. Subcortical and periventricular white matter hypodensities are present bilaterally. There is stable encephalomalacia in the temporal lobe and parieto-occipital region on the left, compatible with old infarct. Senescent calcifications are present in the basal ganglia. Vascular: Atherosclerotic calcification of the carotid siphons. No hyperdense vessel. Skull: Normal. Negative for fracture or focal lesion. Sinuses/Orbits: No acute finding. Other: None. CT CERVICAL SPINE FINDINGS Alignment: Normal. Skull base and vertebrae: No acute fracture. No primary bone lesion or focal pathologic process. Soft tissues and spinal canal: No prevertebral fluid or swelling. No visible canal hematoma. Disc levels: Multilevel intervertebral disc space narrowing, osteophyte formation, and facet arthropathy. Upper chest: Negative. Other: Carotid artery calcifications. IMPRESSION: 1. No acute intracranial process. 2. Atrophy with extensive chronic microvascular ischemic changes and old left MCA territory infarct. 3. Multilevel degenerative changes in the cervical spine without evidence of acute fracture. Electronically Signed   By: Thornell Sartorius M.D.   On: 02/23/2022 00:26   CT CERVICAL SPINE WO CONTRAST  Result Date: 02/23/2022 CLINICAL DATA:  Polytrauma, blunt.  Unwitnessed fall. EXAM: CT HEAD WITHOUT CONTRAST CT CERVICAL SPINE WITHOUT CONTRAST TECHNIQUE: Multidetector CT imaging of the head and cervical spine was performed following the standard protocol without intravenous contrast. Multiplanar CT image reconstructions of the cervical spine were also generated. RADIATION DOSE REDUCTION: This exam was performed according to the departmental dose-optimization program which includes automated exposure control, adjustment of the mA and/or kV according to patient  size and/or use of iterative reconstruction technique. COMPARISON:  02/01/2022. FINDINGS: CT HEAD FINDINGS Brain: No acute intracranial hemorrhage, midline shift or mass effect. No extra-axial fluid collection. Mild generalized atrophy is noted. Subcortical and periventricular white matter hypodensities are present bilaterally. There is stable encephalomalacia in the temporal lobe and parieto-occipital region on the left, compatible with old infarct. Senescent calcifications are present in the basal ganglia. Vascular: Atherosclerotic calcification of the carotid siphons. No hyperdense vessel. Skull: Normal. Negative for fracture or focal lesion. Sinuses/Orbits: No acute finding. Other: None. CT CERVICAL SPINE FINDINGS Alignment: Normal. Skull base and vertebrae: No acute fracture. No primary bone lesion or focal pathologic process. Soft tissues and spinal canal: No prevertebral fluid or swelling. No visible canal hematoma. Disc levels: Multilevel intervertebral disc space narrowing, osteophyte formation, and facet arthropathy. Upper chest: Negative. Other: Carotid artery calcifications. IMPRESSION: 1. No acute  intracranial process. 2. Atrophy with extensive chronic microvascular ischemic changes and old left MCA territory infarct. 3. Multilevel degenerative changes in the cervical spine without evidence of acute fracture. Electronically Signed   By: Thornell Sartorius M.D.   On: 02/23/2022 00:26   DG Hip Port Unilat W or Wo Pelvis 1 View Right  Result Date: 02/23/2022 CLINICAL DATA:  Recent fall with hip pain, initial encounter EXAM: DG HIP (WITH OR WITHOUT PELVIS) 1V PORT RIGHT COMPARISON:  02/02/2022 FINDINGS: Right hip replacement is noted. Cerclage wire is again seen. There is an oblique periprosthetic fracture of the proximal to mid femur identified although incompletely evaluated on this exam. Ring is intact. IMPRESSION: Oblique periprosthetic proximal right femoral fracture. Femur films are recommended for  further evaluation. Electronically Signed   By: Alcide Clever M.D.   On: 02/23/2022 00:01   DG Chest Port 1 View  Result Date: 02/23/2022 CLINICAL DATA:  Recent fall with known femoral fracture, initial encounter EXAM: PORTABLE CHEST 1 VIEW COMPARISON:  02/03/2022 FINDINGS: Cardiac shadow is stable. The lungs are well aerated bilaterally. No focal infiltrate or effusion is seen. No acute bony abnormality is noted. IMPRESSION: No active disease. Electronically Signed   By: Alcide Clever M.D.   On: 02/23/2022 00:00   DG Knee Right Port  Result Date: 02/22/2022 CLINICAL DATA:  And fall with knee pain, initial encounter EXAM: PORTABLE RIGHT KNEE - 2 VIEW COMPARISON:  None Available. FINDINGS: Prior proximal fibular fractures are seen with healing. Degenerative changes of the knee joint are noted. No joint effusion is seen. Fracture in the midshaft of the femur is noted incompletely evaluated on this exam. IMPRESSION: Midshaft right femoral fracture incompletely evaluated on this exam. Degenerative changes of the right knee joint. Electronically Signed   By: Alcide Clever M.D.   On: 02/22/2022 23:59    Procedures Procedures    Medications Ordered in ED Medications  acetaminophen (TYLENOL) tablet 650 mg (has no administration in time range)    Or  acetaminophen (TYLENOL) suppository 650 mg (has no administration in time range)    ED Course/ Medical Decision Making/ A&P Clinical Course as of 02/23/22 0309  Mon Feb 22, 2022  2336 Patient presents from nursing facility after a fall.  Due to her expressive aphasia she is unable provide much history.  EMS reports they received very little information from the nursing facility.  I have called the nursing facility and was unable to contact a staff member [DW]  2336 Per records, patient had a recent right hip fracture and total hip arthroplasty.  Photos of the incision are in the chart.  Patient now presents after presumed fall with right hip pain.  It is  unknown if patient hit her head, therefore level 2 trauma was activated.  Imaging and labs are pending [DW]  Tue Feb 23, 2022  0034 Glucose(!): 164 Hyperglycemia [DW]  0034 Hemoglobin(!): 7.4 Acute on chronic anemia [DW]  0154 Discussed findings with Dr. Victorino Dike.  He request transfer to [facility] long with hospitalist service due to operative care at that facility.  We can try traction for pain [DW]  0217 Daughter was updated on results.  Advised that she may get transferred to [place]  [DW]  0308 Discussed with Dr. Arlean Hopping for admission.  We will attempt to transfer to [facility]  Patient stable in the ER.  No signs of any head or neck trauma [DW]  0309 Patient did have erythema and tenderness around the surgical  site.  There is no drainage.  Will defer any antibiotics to inpatient & surgical team [DW]    Clinical Course User Index [DW] Zadie Rhine, MD                           Medical Decision Making Amount and/or Complexity of Data Reviewed Labs: ordered. Decision-making details documented in ED Course. Radiology: ordered.   This patient presents to the ED for concern of fall and trauma, this involves an extensive number of treatment options, and is a complaint that carries with it a high risk of complications and morbidity.  The differential diagnosis includes but is not limited to intracranial hemorrhage, subdural hematoma, cervical spine fracture, right hip fracture  Comorbidities that complicate the patient evaluation: Patient's presentation is complicated by their history of CVA  Social Determinants of Health:   patient lives in a nursing home   increases the complexity of managing their presentation  Additional history obtained: Additional history obtained from EMS discussed with EMS at bedside Records reviewed previous admission documents  Lab Tests: I Ordered, and personally interpreted labs.  The pertinent results include: Acute on chronic anemia,  leukocytosis  Imaging Studies ordered: I ordered imaging studies including CT scan head and C-spine and X-ray femur, right hip, right knee   I independently visualized and interpreted imaging which showed negative CT head, right periprosthetic fracture noted I agree with the radiologist interpretation  Cardiac Monitoring: The patient was maintained on a cardiac monitor.  I personally viewed and interpreted the cardiac monitor which showed an underlying rhythm of:  sinus rhythm   Consultations Obtained: I requested consultation with the admitting physician Howerter , and ortho dr hewitt discussed  findings as well as pertinent plan - they recommend: admit to [facility]  Reevaluation: After the interventions noted above, I reevaluated the patient and found that they have :improved  Complexity of problems addressed: Patient's presentation is most consistent with  acute presentation with potential threat to life or bodily function  Disposition: After consideration of the diagnostic results and the patient's response to treatment,  I feel that the patent would benefit from admission   .           Final Clinical Impression(s) / ED Diagnoses Final diagnoses:  Periprosthetic fracture around internal prosthetic hip joint, initial encounter    Rx / DC Orders ED Discharge Orders     None         Zadie Rhine, MD 02/23/22 [phone]

## 2022-02-22 NOTE — ED Triage Notes (Signed)
Pt arrived from [facility] via [facility] with unwitnessed fall on Plavi. Pt confused at baseline and poor historian

## 2022-02-23 ENCOUNTER — Emergency Department (HOSPITAL_COMMUNITY): Payer: Medicare Other

## 2022-02-23 ENCOUNTER — Inpatient Hospital Stay (HOSPITAL_COMMUNITY): Payer: Medicare Other

## 2022-02-23 ENCOUNTER — Encounter (HOSPITAL_COMMUNITY): Payer: Self-pay | Admitting: Internal Medicine

## 2022-02-23 ENCOUNTER — Other Ambulatory Visit: Payer: Self-pay

## 2022-02-23 ENCOUNTER — Other Ambulatory Visit (HOSPITAL_COMMUNITY): Payer: Medicare Other

## 2022-02-23 DIAGNOSIS — E785 Hyperlipidemia, unspecified: Secondary | ICD-10-CM

## 2022-02-23 DIAGNOSIS — F0394 Unspecified dementia, unspecified severity, with anxiety: Secondary | ICD-10-CM | POA: Diagnosis present

## 2022-02-23 DIAGNOSIS — W06XXXA Fall from bed, initial encounter: Secondary | ICD-10-CM | POA: Diagnosis present

## 2022-02-23 DIAGNOSIS — M978XXA Periprosthetic fracture around other internal prosthetic joint, initial encounter: Secondary | ICD-10-CM

## 2022-02-23 DIAGNOSIS — M25551 Pain in right hip: Secondary | ICD-10-CM | POA: Diagnosis present

## 2022-02-23 DIAGNOSIS — E538 Deficiency of other specified B group vitamins: Secondary | ICD-10-CM | POA: Diagnosis present

## 2022-02-23 DIAGNOSIS — Y92009 Unspecified place in unspecified non-institutional (private) residence as the place of occurrence of the external cause: Secondary | ICD-10-CM

## 2022-02-23 DIAGNOSIS — Y92129 Unspecified place in nursing home as the place of occurrence of the external cause: Secondary | ICD-10-CM | POA: Insufficient documentation

## 2022-02-23 DIAGNOSIS — Z7982 Long term (current) use of aspirin: Secondary | ICD-10-CM | POA: Diagnosis not present

## 2022-02-23 DIAGNOSIS — Z6835 Body mass index (BMI) 35.0-35.9, adult: Secondary | ICD-10-CM | POA: Diagnosis not present

## 2022-02-23 DIAGNOSIS — E876 Hypokalemia: Secondary | ICD-10-CM | POA: Diagnosis present

## 2022-02-23 DIAGNOSIS — J45909 Unspecified asthma, uncomplicated: Secondary | ICD-10-CM | POA: Diagnosis present

## 2022-02-23 DIAGNOSIS — S72331A Displaced oblique fracture of shaft of right femur, initial encounter for closed fracture: Secondary | ICD-10-CM | POA: Diagnosis present

## 2022-02-23 DIAGNOSIS — I10 Essential (primary) hypertension: Secondary | ICD-10-CM | POA: Diagnosis present

## 2022-02-23 DIAGNOSIS — F418 Other specified anxiety disorders: Secondary | ICD-10-CM | POA: Diagnosis not present

## 2022-02-23 DIAGNOSIS — W19XXXA Unspecified fall, initial encounter: Secondary | ICD-10-CM

## 2022-02-23 DIAGNOSIS — Z9049 Acquired absence of other specified parts of digestive tract: Secondary | ICD-10-CM | POA: Diagnosis not present

## 2022-02-23 DIAGNOSIS — D649 Anemia, unspecified: Secondary | ICD-10-CM

## 2022-02-23 DIAGNOSIS — D72825 Bandemia: Secondary | ICD-10-CM | POA: Diagnosis not present

## 2022-02-23 DIAGNOSIS — Z8719 Personal history of other diseases of the digestive system: Secondary | ICD-10-CM | POA: Diagnosis not present

## 2022-02-23 DIAGNOSIS — D509 Iron deficiency anemia, unspecified: Secondary | ICD-10-CM | POA: Diagnosis present

## 2022-02-23 DIAGNOSIS — F419 Anxiety disorder, unspecified: Secondary | ICD-10-CM

## 2022-02-23 DIAGNOSIS — Z20822 Contact with and (suspected) exposure to covid-19: Secondary | ICD-10-CM | POA: Diagnosis present

## 2022-02-23 DIAGNOSIS — Z888 Allergy status to other drugs, medicaments and biological substances status: Secondary | ICD-10-CM | POA: Diagnosis not present

## 2022-02-23 DIAGNOSIS — E1151 Type 2 diabetes mellitus with diabetic peripheral angiopathy without gangrene: Secondary | ICD-10-CM | POA: Diagnosis not present

## 2022-02-23 DIAGNOSIS — D72829 Elevated white blood cell count, unspecified: Secondary | ICD-10-CM

## 2022-02-23 DIAGNOSIS — Z96649 Presence of unspecified artificial hip joint: Secondary | ICD-10-CM

## 2022-02-23 DIAGNOSIS — M9701XA Periprosthetic fracture around internal prosthetic right hip joint, initial encounter: Secondary | ICD-10-CM | POA: Diagnosis present

## 2022-02-23 DIAGNOSIS — Z7984 Long term (current) use of oral hypoglycemic drugs: Secondary | ICD-10-CM | POA: Diagnosis not present

## 2022-02-23 DIAGNOSIS — E1165 Type 2 diabetes mellitus with hyperglycemia: Secondary | ICD-10-CM | POA: Diagnosis present

## 2022-02-23 DIAGNOSIS — Z66 Do not resuscitate: Secondary | ICD-10-CM | POA: Diagnosis present

## 2022-02-23 DIAGNOSIS — F0393 Unspecified dementia, unspecified severity, with mood disturbance: Secondary | ICD-10-CM | POA: Diagnosis present

## 2022-02-23 DIAGNOSIS — E119 Type 2 diabetes mellitus without complications: Secondary | ICD-10-CM

## 2022-02-23 DIAGNOSIS — D62 Acute posthemorrhagic anemia: Secondary | ICD-10-CM | POA: Diagnosis not present

## 2022-02-23 DIAGNOSIS — E669 Obesity, unspecified: Secondary | ICD-10-CM | POA: Diagnosis present

## 2022-02-23 DIAGNOSIS — Z882 Allergy status to sulfonamides status: Secondary | ICD-10-CM | POA: Diagnosis not present

## 2022-02-23 DIAGNOSIS — I69320 Aphasia following cerebral infarction: Secondary | ICD-10-CM | POA: Diagnosis not present

## 2022-02-23 DIAGNOSIS — Y92122 Bedroom in nursing home as the place of occurrence of the external cause: Secondary | ICD-10-CM | POA: Diagnosis not present

## 2022-02-23 LAB — COMPREHENSIVE METABOLIC PANEL
ALT: 11 U/L (ref 0–44)
ALT: 11 U/L (ref 0–44)
AST: 17 U/L (ref 15–41)
AST: 18 U/L (ref 15–41)
Albumin: 2.7 g/dL — ABNORMAL LOW (ref 3.5–5.0)
Albumin: 2.7 g/dL — ABNORMAL LOW (ref 3.5–5.0)
Alkaline Phosphatase: 89 U/L (ref 38–126)
Alkaline Phosphatase: 91 U/L (ref 38–126)
Anion gap: 12 (ref 5–15)
Anion gap: 9 (ref 5–15)
BUN: 12 mg/dL (ref 8–23)
BUN: 13 mg/dL (ref 8–23)
CO2: 22 mmol/L (ref 22–32)
CO2: 25 mmol/L (ref 22–32)
Calcium: 8.8 mg/dL — ABNORMAL LOW (ref 8.9–10.3)
Calcium: 9.2 mg/dL (ref 8.9–10.3)
Chloride: 104 mmol/L (ref 98–111)
Chloride: 107 mmol/L (ref 98–111)
Creatinine, Ser: 0.64 mg/dL (ref 0.44–1.00)
Creatinine, Ser: 0.66 mg/dL (ref 0.44–1.00)
GFR, Estimated: >60 mL/min (ref >60)
GFR, Estimated: >60 mL/min (ref >60)
Glucose, Bld: 150 mg/dL — ABNORMAL HIGH (ref 70–99)
Glucose, Bld: 164 mg/dL — ABNORMAL HIGH (ref 70–99)
Potassium: 3.4 mmol/L — ABNORMAL LOW (ref 3.5–5.1)
Potassium: 3.8 mmol/L (ref 3.5–5.1)
Sodium: 138 mmol/L (ref 135–145)
Sodium: 141 mmol/L (ref 135–145)
Total Bilirubin: 0.4 mg/dL (ref 0.3–1.2)
Total Bilirubin: 0.5 mg/dL (ref 0.3–1.2)
Total Protein: 5.9 g/dL — ABNORMAL LOW (ref 6.5–8.1)
Total Protein: 6.2 g/dL — ABNORMAL LOW (ref 6.5–8.1)

## 2022-02-23 LAB — GLUCOSE, CAPILLARY
Glucose-Capillary: 135 mg/dL — ABNORMAL HIGH (ref 70–99)
Glucose-Capillary: 146 mg/dL — ABNORMAL HIGH (ref 70–99)

## 2022-02-23 LAB — IRON AND TIBC
Iron: 16 ug/dL — ABNORMAL LOW (ref 28–170)
Saturation Ratios: 5 % — ABNORMAL LOW (ref 10.4–31.8)
TIBC: 319 ug/dL (ref 250–450)
UIBC: 303 ug/dL

## 2022-02-23 LAB — CBC WITH DIFFERENTIAL/PLATELET
Abs Immature Granulocytes: 0.03 10*3/uL (ref 0.00–0.07)
Basophils Absolute: 0 10*3/uL (ref 0.0–0.1)
Basophils Relative: 0 %
Eosinophils Absolute: 0.1 10*3/uL (ref 0.0–0.5)
Eosinophils Relative: 1 %
HCT: 23.5 % — ABNORMAL LOW (ref 36.0–46.0)
Hemoglobin: 7.1 g/dL — ABNORMAL LOW (ref 12.0–15.0)
Immature Granulocytes: 0 %
Lymphocytes Relative: 18 %
Lymphs Abs: 1.7 10*3/uL (ref 0.7–4.0)
MCH: 24.8 pg — ABNORMAL LOW (ref 26.0–34.0)
MCHC: 30.2 g/dL (ref 30.0–36.0)
MCV: 82.2 fL (ref 80.0–100.0)
Monocytes Absolute: 1 10*3/uL (ref 0.1–1.0)
Monocytes Relative: 11 %
Neutro Abs: 6.5 10*3/uL (ref 1.7–7.7)
Neutrophils Relative %: 70 %
Platelets: 591 10*3/uL — ABNORMAL HIGH (ref 150–400)
RBC: 2.86 MIL/uL — ABNORMAL LOW (ref 3.87–5.11)
RDW: 14.9 % (ref 11.5–15.5)
WBC: 9.3 10*3/uL (ref 4.0–10.5)
nRBC: 0 % (ref 0.0–0.2)

## 2022-02-23 LAB — FERRITIN: Ferritin: 67 ng/mL (ref 11–307)

## 2022-02-23 LAB — CBC
HCT: 22.3 % — ABNORMAL LOW (ref 36.0–46.0)
HCT: 23.8 % — ABNORMAL LOW (ref 36.0–46.0)
Hemoglobin: 6.9 g/dL — CL (ref 12.0–15.0)
Hemoglobin: 7.4 g/dL — ABNORMAL LOW (ref 12.0–15.0)
MCH: 25.2 pg — ABNORMAL LOW (ref 26.0–34.0)
MCH: 25.9 pg — ABNORMAL LOW (ref 26.0–34.0)
MCHC: 30.9 g/dL (ref 30.0–36.0)
MCHC: 31.1 g/dL (ref 30.0–36.0)
MCV: 81.4 fL (ref 80.0–100.0)
MCV: 83.2 fL (ref 80.0–100.0)
Platelets: 554 10*3/uL — ABNORMAL HIGH (ref 150–400)
Platelets: 574 10*3/uL — ABNORMAL HIGH (ref 150–400)
RBC: 2.74 MIL/uL — ABNORMAL LOW (ref 3.87–5.11)
RBC: 2.86 MIL/uL — ABNORMAL LOW (ref 3.87–5.11)
RDW: 15 % (ref 11.5–15.5)
RDW: 15.2 % (ref 11.5–15.5)
WBC: 12.9 10*3/uL — ABNORMAL HIGH (ref 4.0–10.5)
WBC: 7.9 10*3/uL (ref 4.0–10.5)
nRBC: 0 % (ref 0.0–0.2)
nRBC: 0 % (ref 0.0–0.2)

## 2022-02-23 LAB — PROTIME-INR
INR: 1.2 (ref 0.8–1.2)
Prothrombin Time: 14.6 seconds (ref 11.4–15.2)

## 2022-02-23 LAB — CBG MONITORING, ED
Glucose-Capillary: 137 mg/dL — ABNORMAL HIGH (ref 70–99)
Glucose-Capillary: 126 mg/dL — ABNORMAL HIGH (ref 70–99)

## 2022-02-23 LAB — RESP PANEL BY RT-PCR (FLU A&B, COVID) ARPGX2
Influenza A by PCR: NEGATIVE
Influenza B by PCR: NEGATIVE
SARS Coronavirus 2 by RT PCR: NEGATIVE

## 2022-02-23 LAB — HEMOGLOBIN AND HEMATOCRIT, BLOOD
HCT: 27.6 % — ABNORMAL LOW (ref 36.0–46.0)
Hemoglobin: 8.7 g/dL — ABNORMAL LOW (ref 12.0–15.0)

## 2022-02-23 LAB — SEDIMENTATION RATE: Sed Rate: 55 mm/hr — ABNORMAL HIGH (ref 0–22)

## 2022-02-23 LAB — PREPARE RBC (CROSSMATCH)

## 2022-02-23 LAB — FOLATE: Folate: 10.5 ng/mL (ref >5.9)

## 2022-02-23 LAB — MRSA NEXT GEN BY PCR, NASAL: MRSA by PCR Next Gen: NOT DETECTED

## 2022-02-23 LAB — CK: Total CK: 127 U/L (ref 38–234)

## 2022-02-23 LAB — MAGNESIUM: Magnesium: 1.7 mg/dL (ref 1.7–2.4)

## 2022-02-23 LAB — VITAMIN B12: Vitamin B-12: 397 pg/mL (ref 180–914)

## 2022-02-23 LAB — C-REACTIVE PROTEIN: CRP: 4.7 mg/dL — ABNORMAL HIGH (ref <1.0)

## 2022-02-23 LAB — ETHANOL: Alcohol, Ethyl (B): <10 mg/dL (ref <10)

## 2022-02-23 MED ORDER — SODIUM CHLORIDE 0.9% IV SOLUTION: Freq: Once | INTRAVENOUS | Status: AC

## 2022-02-23 MED ORDER — CYANOCOBALAMIN 1000 MCG/ML IJ SOLN
1000.0000 ug | Freq: Once | INTRAMUSCULAR | Status: AC
Start: 2022-02-23 — End: 2022-02-23
  Administered 2022-02-23: 1000 ug via SUBCUTANEOUS
  Filled 2022-02-23: qty 1

## 2022-02-23 MED ORDER — SODIUM CHLORIDE 0.9 % IV SOLN
250.0000 mg | Freq: Once | INTRAVENOUS | Status: AC
  Administered 2022-02-23: 250 mg via INTRAVENOUS
  Filled 2022-02-23: qty 20

## 2022-02-23 MED ORDER — CEFAZOLIN SODIUM-DEXTROSE 2-4 GM/100ML-% IV SOLN: 2.0000 g | INTRAVENOUS | Status: DC

## 2022-02-23 MED ORDER — ACETAMINOPHEN 325 MG PO TABS
650.0000 mg | ORAL_TABLET | Freq: Four times a day (QID) | ORAL | Status: DC | PRN
Start: 2022-02-23 — End: 2022-02-25
  Administered 2022-02-23: 650 mg via ORAL
  Filled 2022-02-23: qty 2

## 2022-02-23 MED ORDER — CHLORHEXIDINE GLUCONATE 4 % EX LIQD
60.0000 mL | Freq: Once | CUTANEOUS | Status: AC
  Administered 2022-02-24: 4 via TOPICAL

## 2022-02-23 MED ORDER — IOHEXOL 300 MG/ML  SOLN
100.0000 mL | Freq: Once | INTRAMUSCULAR | Status: AC | PRN
  Administered 2022-02-23: 100 mL via INTRAVENOUS

## 2022-02-23 MED ORDER — FLUOXETINE HCL 20 MG PO CAPS
40.0000 mg | ORAL_CAPSULE | Freq: Every day | ORAL | Status: DC
  Administered 2022-02-23 – 2022-02-26 (×3): 40 mg via ORAL
  Filled 2022-02-23 (×3): qty 2

## 2022-02-23 MED ORDER — METHOCARBAMOL 1000 MG/10ML IJ SOLN: 500.0000 mg | Freq: Four times a day (QID) | INTRAMUSCULAR | Status: DC | PRN

## 2022-02-23 MED ORDER — DOCUSATE SODIUM 100 MG PO CAPS
100.0000 mg | ORAL_CAPSULE | Freq: Two times a day (BID) | ORAL | Status: DC
  Administered 2022-02-23 – 2022-02-26 (×5): 100 mg via ORAL
  Filled 2022-02-23 (×5): qty 1

## 2022-02-23 MED ORDER — METHOCARBAMOL 500 MG PO TABS: 500.0000 mg | ORAL_TABLET | Freq: Four times a day (QID) | ORAL | Status: DC | PRN

## 2022-02-23 MED ORDER — POTASSIUM CHLORIDE CRYS ER 20 MEQ PO TBCR
40.0000 meq | EXTENDED_RELEASE_TABLET | Freq: Once | ORAL | Status: AC
  Administered 2022-02-23: 40 meq via ORAL
  Filled 2022-02-23: qty 2

## 2022-02-23 MED ORDER — METHOCARBAMOL 500 MG PO TABS
500.0000 mg | ORAL_TABLET | Freq: Three times a day (TID) | ORAL | Status: DC | PRN
  Administered 2022-02-23: 500 mg via ORAL
  Filled 2022-02-23: qty 1

## 2022-02-23 MED ORDER — HYDROMORPHONE HCL 1 MG/ML IJ SOLN
0.5000 mg | INTRAMUSCULAR | Status: DC | PRN
  Administered 2022-02-23 – 2022-02-24 (×6): 0.5 mg via INTRAVENOUS
  Filled 2022-02-23 (×4): qty 0.5
  Filled 2022-02-23: qty 1
  Filled 2022-02-23: qty 0.5

## 2022-02-23 MED ORDER — OXYCODONE HCL 5 MG PO TABS
5.0000 mg | ORAL_TABLET | ORAL | Status: DC | PRN
  Administered 2022-02-23 (×2): 5 mg via ORAL
  Filled 2022-02-23 (×2): qty 1

## 2022-02-23 MED ORDER — ACETAMINOPHEN 650 MG RE SUPP
650.0000 mg | Freq: Four times a day (QID) | RECTAL | Status: DC | PRN
Start: 2022-02-23 — End: 2022-02-25

## 2022-02-23 MED ORDER — POVIDONE-IODINE 10 % EX SWAB: 2.0000 | Freq: Once | CUTANEOUS | Status: DC

## 2022-02-23 MED ORDER — FENTANYL CITRATE PF 50 MCG/ML IJ SOSY
50.0000 ug | PREFILLED_SYRINGE | INTRAMUSCULAR | Status: DC | PRN
  Administered 2022-02-23 (×2): 50 ug via INTRAVENOUS
  Filled 2022-02-23 (×2): qty 1

## 2022-02-23 MED ORDER — FENOFIBRATE 54 MG PO TABS
54.0000 mg | ORAL_TABLET | Freq: Every morning | ORAL | Status: DC
  Administered 2022-02-23 – 2022-02-26 (×3): 54 mg via ORAL
  Filled 2022-02-23 (×3): qty 1

## 2022-02-23 MED ORDER — KCL IN DEXTROSE-NACL 20-5-0.45 MEQ/L-%-% IV SOLN
INTRAVENOUS | Status: DC
  Filled 2022-02-23 (×2): qty 1000

## 2022-02-23 MED ORDER — INSULIN ASPART 100 UNIT/ML IJ SOLN: 0.0000 [IU] | Freq: Three times a day (TID) | INTRAMUSCULAR | Status: DC

## 2022-02-23 MED ORDER — HYDROXYZINE HCL 25 MG PO TABS
25.0000 mg | ORAL_TABLET | Freq: Three times a day (TID) | ORAL | Status: DC | PRN
  Administered 2022-02-24 – 2022-02-25 (×2): 25 mg via ORAL
  Filled 2022-02-23 (×2): qty 1

## 2022-02-23 MED ORDER — SODIUM CHLORIDE 0.9% IV SOLUTION
Freq: Once | INTRAVENOUS | Status: AC
  Administered 2022-02-23: 10 mL/h via INTRAVENOUS

## 2022-02-23 MED ORDER — SODIUM CHLORIDE 0.9 % IV SOLN: INTRAVENOUS | Status: DC

## 2022-02-23 MED ORDER — ONDANSETRON HCL 4 MG/2ML IJ SOLN
4.0000 mg | Freq: Four times a day (QID) | INTRAMUSCULAR | Status: DC | PRN
  Administered 2022-02-23: 4 mg via INTRAVENOUS
  Filled 2022-02-23: qty 2

## 2022-02-23 MED ORDER — CLONAZEPAM 0.5 MG PO TABS
0.5000 mg | ORAL_TABLET | Freq: Two times a day (BID) | ORAL | Status: DC
  Administered 2022-02-23 – 2022-02-26 (×4): 0.5 mg via ORAL
  Filled 2022-02-23 (×4): qty 1

## 2022-02-23 MED ORDER — CLONAZEPAM 0.5 MG PO TABS
0.5000 mg | ORAL_TABLET | Freq: Two times a day (BID) | ORAL | Status: DC | PRN
  Administered 2022-02-23: 0.5 mg via ORAL
  Filled 2022-02-23: qty 1

## 2022-02-23 MED ORDER — VITAMIN B-12 1000 MCG PO TABS
1000.0000 ug | ORAL_TABLET | Freq: Every day | ORAL | Status: DC
  Administered 2022-02-25 – 2022-02-26 (×2): 1000 ug via ORAL
  Filled 2022-02-23 (×2): qty 1

## 2022-02-23 MED ORDER — NALOXONE HCL 0.4 MG/ML IJ SOLN: 0.4000 mg | INTRAMUSCULAR | Status: DC | PRN

## 2022-02-23 MED ORDER — MELATONIN 5 MG PO TABS: 10.0000 mg | ORAL_TABLET | Freq: Every evening | ORAL | Status: DC | PRN

## 2022-02-23 MED ORDER — HALOPERIDOL LACTATE 5 MG/ML IJ SOLN
2.0000 mg | Freq: Four times a day (QID) | INTRAMUSCULAR | Status: DC | PRN
Start: 2022-02-23 — End: 2022-02-26
  Administered 2022-02-23 – 2022-02-24 (×2): 2 mg via INTRAVENOUS
  Filled 2022-02-23 (×3): qty 1

## 2022-02-23 NOTE — ED Notes (Signed)
Patient transported to CT 

## 2022-02-23 NOTE — Assessment & Plan Note (Signed)
 #)   Acute periprosthetic fracture of the right hip: In the setting of recent right total hip arthroplasty on 02/02/2022 via Dr. Marisa Sprinkles [facility], the patient presents this evening with acute periprosthetic fracture of the right hip after ground-level fall incurred at SNF earlier today, with plain films of the right hip and proximal right femur confirming periprosthetic proximal right femoral fracture.  In this patient who is nonverbal at baseline, she appears comfortable at this time with, right lower extremity appears warm and well-perfused.  She is on antiplatelet therapy as an outpatient, specifically on at least daily Plavix.   EDP discussed the patient's case and imaging with the on-call orthopedic surgeon from [facility], who requested that the patient be admitted to the hospitalist service at [facility] for further evaluation and management of presenting acute periprosthetic fracture of right hip, as Dr. Linna Caprice operates nearly exclusively out of [facility]; orthopedic surgeon also conveyed that patient will be seen by [facility] on the morning of 02/23/2022 but that ensuing surgical intervention for her acute right periprosthetic hip fracture would likely not occur until 02/24/2022.  Of note, present chest x-ray showed no evidence of acute cardiopulmonary process, while EKG showed sinus rhythm without evidence of acute ischemic changes.  Additional work-up notable for INR, which was found to be 1.2.  Gupta Score for this patient in the context of anticipated aforementioned orthopedic surgery conveys a   2.05% perioperative risk for significant cardiac event. No evidence to suggest acutely decompensated heart failure or acute MI. Consequently, no absolute contraindications to proceeding with proposed orthopedic surgery at this time.   Plan: formal [facility] consult for definitive surgical management at [facility], with plan to take patient to the OR tomorrow  (7/5).  As patient is  not anticipated to go to surgery until tomorrow, will provide diet at this time. No pharmacologic anticoagulation leading up to this anticipated surgery.  Holding outpatient Plavix for now.  SCD's. Prn IV fentanyl. Anticipate postoperative PT consult. type and screen ordered.

## 2022-02-23 NOTE — Plan of Care (Signed)
  Problem: Education: Goal: Knowledge of the prescribed therapeutic regimen will improve Outcome: Not Progressing Goal: Understanding of discharge needs will improve Outcome: Not Progressing Goal: Individualized Educational Video(s) Outcome: Not Progressing   Problem: Activity: Goal: Ability to avoid complications of mobility impairment will improve Outcome: Not Progressing Goal: Ability to tolerate increased activity will improve Outcome: Not Progressing   Problem: Clinical Measurements: Goal: Postoperative complications will be avoided or minimized Outcome: Not Progressing   Problem: Pain Management: Goal: Pain level will decrease with appropriate interventions Outcome: Progressing   Problem: Skin Integrity: Goal: Will show signs of wound healing Outcome: Progressing

## 2022-02-23 NOTE — Progress Notes (Signed)
Chaplain responded to this level II fall on thinners.  Patient was unavailable as she was being evaluated.  No family present at the time.  Chaplain available for support as needed. Chaplain Agustin Cree, Mdiv.   02/23/22 0000  Clinical Encounter Type  Visited With Health care provider  Visit Type Initial;Trauma;ED  Referral From Nurse  Consult/Referral To Chaplain

## 2022-02-23 NOTE — ED Notes (Signed)
Report received. Severe pain present. Alert, Interactive.

## 2022-02-23 NOTE — Progress Notes (Signed)
[facility] PLAN OF CARE NOTE Patient: Rebecca Barnes TOI:[id number]   PCP: Caesar Bookman, NP DOB: July 07, 1946   DOA: 02/22/2022   DOS: 02/23/2022    Patient was admitted by my colleague earlier on 02/23/2022. I have reviewed the H&P as well as assessment and plan and agree with the same. Important changes in the plan are listed below.  Plan of care: Principal Problem:   Periprosthetic hip fracture Active Problems:   Anxiety   Type 2 diabetes mellitus without complication, without long-term current use of insulin (HCC)   Leukocytosis   Essential hypertension   Hypokalemia   HLD (hyperlipidemia)   Normocytic anemia   Fall at home, initial encounter Periprosthetic hip fracture. Orthopedic surgery consulted.  Patient was initially in the [facility].  Transferred to [facility] hospital for surgical correction. Currently scheduled for 02/24/2022.  Surgical site hematoma. Concern for infection. CT scan on 02/14/2022 shows right hip fluid collection, measuring 10.5 x 5.4 cm and containing small locules of air mostly concerning for hematoma. CT scan on 02/23/2022 shows size have increased 16 x 6 x 12 cm (volume = 600 cm^3). While he does make a comment about abscess patient does not appear to have any significant abscess but will defer to surgery for further management. Certainly when the patient is on Plavix there is a concern that this can lead to more bleeding.  Acute postoperative blood loss anemia. Iron deficiency anemia.  Vitamin B12 deficiency. Baseline hemoglobin is around 13. Postoperatively hemoglobin dropped down to around 7. Currently hemoglobin is 6.9, iron level 16, vitamin B12 at 397. Folic acid level 10.5. Currently we will give 1 dose of IV iron, 1 dose of subcutaneous B12 injection and oral folic acid level therapy. Monitor CBC posttransfusion. Consent obtained from daughter.  Agitation. Appears to be an ongoing issue.  Patient actually required psychiatric  consultation in the past. For now we will continue scheduled Klonopin, Seroquel as needed.  Along with Haldol as needed.   Author: Lynden Oxford, MD [facility] 02/23/2022 6:13 PM   If 7PM-7AM, please contact night-coverage at [email or web address]

## 2022-02-23 NOTE — Assessment & Plan Note (Signed)
  #)   Essential Hypertension: documented h/o such, with outpatient antihypertensive regimen including losartan, Norvasc.  SBP's in the ED today: Low 100s mmHg. consequently, will hold home antihypertensive medications overnight, with plan to reevaluate timing of resumption of these blood pressure medications in the morning via interval monitoring of BP trend.   Plan: Close monitoring of subsequent BP via routine VS. holding home antihypertensive medications for now, as above.

## 2022-02-23 NOTE — ED Notes (Signed)
Date and time results received: 02/23/22 11:52 AM     Test: Hgb Critical Value: 6.9  Name of Provider Notified: Lynden Oxford MD

## 2022-02-23 NOTE — Assessment & Plan Note (Signed)
  #)   Generalized anxiety disorder: Documented history of such, on Prozac as well as as needed clonazepam as an outpatient.  Of note, presenting EKG demonstrates no evidence of QTc prolongation.  Plan: Continue outpatient Prozac as well as as needed clonazepam.

## 2022-02-23 NOTE — Assessment & Plan Note (Signed)
 #)   Type 2 Diabetes Mellitus: documented history of such. Home insulin regimen: None. Home oral hypoglycemic agents: Metformin. presenting blood sugar: 164.  Most recent hemoglobin A1c was noted to be 6.3% on 12/04/2021.   Plan: accuchecks QAC and HS with low dose SSI. hold home oral hypoglycemic agents during this hospitalization.

## 2022-02-23 NOTE — ED Notes (Signed)
Remains alert, NAD, calmer. VSS. Pain improved. Pedal pulses palpable. Feet pink and warm. Compartments supple. R leg remains shortened and rotated.

## 2022-02-23 NOTE — H&P (View-Only) (Signed)
Reason for Consult: Right hip periprosthetic fracture Referring Physician: Zadie Rhine, MD  Rebecca Barnes is an 76 y.o. female with medical history significant for aphasia, nonverbal at baseline, type II diabetes mellitus, hypertension, and hyperlipidemia. She recently underwent right THA with Dr. Linna Caprice on 02/02/2022. She presents to the emergency department after a reported fall out of her bed at SNF on 02/22/2022. Unclear if this was a witnessed fall. Also unclear if the patient hit her head as a component of the fall.  EMS responded to call for patient, and upon arrival at SNF, noted that the patient was back in her bed.  On evaluation, they noted the patient's right lower extremity to be showing evidence of external rotation prompting the patient to be [facility] emergency department for further evaluation.  Daughter, who is at bedside upon evaluation, reports patient is uncomfortable due to hip. She is also very anxious about next steps.  Past Medical History:  Diagnosis Date   Anxiety    Asthma    Diabetes mellitus without complication (HCC)    Hypertension    Stroke [facility])     Past Surgical History:  Procedure Laterality Date   arm fracture     arm surgery   CHOLECYSTECTOMY     TOTAL HIP ARTHROPLASTY Right 02/02/2022   Procedure: TOTAL HIP ARTHROPLASTY ANTERIOR APPROACH;  Surgeon: Samson Frederic, MD;  Location: [facility] OR;  Service: Orthopedics;  Laterality: Right;    Family History  Problem Relation Age of Onset   Cancer Mother     Social History:  reports that she has never smoked. She has never used smokeless tobacco. She reports that she does not currently use drugs after having used the following drugs: Marijuana. She reports that she does not drink alcohol.  Allergies:  Allergies  Allergen Reactions   Januvia [Sitagliptin] Other (See Comments)    dizziness   Statins Other (See Comments)    Hospitalized on 2 occasions after trying statins. Patient became  dizzy and increased fall risk   Sulfa Antibiotics Other (See Comments)    Childhood Allergy  Unknown reaction    Medications: I have reviewed the patient's current medications.  Results for orders placed or performed during the hospital encounter of 02/22/22 (from the past 48 hour(s))  Type and screen [facility]     Status: None   Collection Time: 02/22/22 11:32 PM  Result Value Ref Range   ABO/RH(D) O NEG    Antibody Screen NEG    Sample Expiration      02/25/2022,2359 Performed at [facility], [address] [place], [place] [address]   Comprehensive metabolic panel     Status: Abnormal   Collection Time: 02/22/22 11:37 PM  Result Value Ref Range   Sodium 138 135 - 145 mmol/L   Potassium 3.4 (L) 3.5 - 5.1 mmol/L   Chloride 107 98 - 111 mmol/L   CO2 22 22 - 32 mmol/L   Glucose, Bld 164 (H) 70 - 99 mg/dL    Comment: Glucose reference range applies only to samples taken after fasting for at least 8 hours.   BUN 13 8 - 23 mg/dL   Creatinine, Ser 0.76 0.44 - 1.00 mg/dL   Calcium 8.8 (L) 8.9 - 10.3 mg/dL   Total Protein 5.9 (L) 6.5 - 8.1 g/dL   Albumin 2.7 (L) 3.5 - 5.0 g/dL   AST 18 15 - 41 U/L   ALT 11 0 - 44 U/L  Alkaline Phosphatase 89 38 - 126 U/L   Total Bilirubin 0.4 0.3 - 1.2 mg/dL   GFR, Estimated >60 >60 mL/min    Comment: (NOTE) Calculated using the CKD-EPI Creatinine Equation (2021)    Anion gap 9 5 - 15    Comment: Performed at [facility], [address] [place], [place] [address]  CBC     Status: Abnormal   Collection Time: 02/22/22 11:37 PM  Result Value Ref Range   WBC 12.9 (H) 4.0 - 10.5 K/uL   RBC 2.86 (L) 3.87 - 5.11 MIL/uL   Hemoglobin 7.4 (L) 12.0 - 15.0 g/dL   HCT 23.8 (L) 36.0 - 46.0 %   MCV 83.2 80.0 - 100.0 fL   MCH 25.9 (L) 26.0 - 34.0 pg   MCHC 31.1 30.0 - 36.0 g/dL   RDW 15.0 11.5 - 15.5 %   Platelets 554 (H) 150 - 400 K/uL   nRBC 0.0 0.0 - 0.2 %    Comment: Performed at [facility], [address] [place], [place] [address]  Ethanol     Status: None   Collection Time: 02/22/22 11:37 PM  Result Value Ref Range   Alcohol, Ethyl (B) <10 <10 mg/dL    Comment: (NOTE) Lowest detectable limit for serum alcohol is 10 mg/dL.  For medical purposes only. Performed at [facility], [address] [place], [place] [address]   Protime-INR     Status: None   Collection Time: 02/22/22 11:37 PM  Result Value Ref Range   Prothrombin Time 14.6 11.4 - 15.2 seconds   INR 1.2 0.8 - 1.2    Comment: (NOTE) INR goal varies based on device and disease states. Performed at [facility], [address] [place], [place] [address]   I-stat chem 8, ed     Status: Abnormal   Collection Time: 02/22/22 11:47 PM  Result Value Ref Range   Sodium 138 135 - 145 mmol/L   Potassium 3.4 (L) 3.5 - 5.1 mmol/L   Chloride 106 98 - 111 mmol/L   BUN 11 8 - 23 mg/dL   Creatinine, Ser 0.40 (L) 0.44 - 1.00 mg/dL   Glucose, Bld 164 (H) 70 - 99 mg/dL    Comment: Glucose reference range applies only to samples taken after fasting for at least 8 hours.   Calcium, Ion 1.14 (L) 1.15 - 1.40 mmol/L   TCO2 22 22 - 32 mmol/L   Hemoglobin 7.8 (L) 12.0 - 15.0 g/dL   HCT 23.0 (L) 36.0 - 46.0 %  Resp Panel by RT-PCR (Flu A&B, Covid) Anterior Nasal Swab     Status: None   Collection Time: 02/23/22  1:39 AM   Specimen: Anterior Nasal Swab  Result Value Ref Range   SARS Coronavirus 2 by RT PCR NEGATIVE NEGATIVE    Comment: (NOTE) SARS-CoV-2 target nucleic acids are NOT DETECTED.  The SARS-CoV-2 RNA is generally detectable in upper respiratory specimens during the acute phase of infection. The lowest concentration of SARS-CoV-2 viral copies this assay can detect is 138 copies/mL. A negative result does not preclude SARS-Cov-2 infection and should not be used as the sole basis for treatment or other patient management decisions. A negative result may occur with  improper specimen collection/handling, submission  of specimen other than nasopharyngeal swab, presence of viral mutation(s) within the areas targeted by this assay, and inadequate number of viral copies(<138 copies/mL). A negative result must be combined with clinical observations, patient   history, and epidemiological information. The expected result is Negative.  Fact Sheet for Patients:  https://www.fda.gov/media/152166/download  Fact Sheet for Healthcare Providers:  https://www.fda.gov/media/152162/download  This test is no t yet approved or cleared by the [place] FDA and  has been authorized for detection and/or diagnosis of SARS-CoV-2 by FDA under an Emergency Use Authorization (EUA). This EUA will remain  in effect (meaning this test can be used) for the duration of the COVID-19 declaration under Section 564(b)(1) of the Act, 21 U.S.C.section 360bbb-3(b)(1), unless the authorization is terminated  or revoked sooner.       Influenza A by PCR NEGATIVE NEGATIVE   Influenza B by PCR NEGATIVE NEGATIVE    Comment: (NOTE) The Xpert Xpress SARS-CoV-2/FLU/RSV plus assay is intended as an aid in the diagnosis of influenza from Nasopharyngeal swab specimens and should not be used as a sole basis for treatment. Nasal washings and aspirates are unacceptable for Xpert Xpress SARS-CoV-2/FLU/RSV testing.  Fact Sheet for Patients: https://www.fda.gov/media/152166/download  Fact Sheet for Healthcare Providers: https://www.fda.gov/media/152162/download  This test is not yet approved or cleared by the [place] FDA and has been authorized for detection and/or diagnosis of SARS-CoV-2 by FDA under an Emergency Use Authorization (EUA). This EUA will remain in effect (meaning this test can be used) for the duration of the COVID-19 declaration under Section 564(b)(1) of the Act, 21 U.S.C. section 360bbb-3(b)(1), unless the authorization is terminated or revoked.  Performed at [facility], [address] [place],  [place] [address]   CBC with Differential/Platelet     Status: Abnormal   Collection Time: 02/23/22  4:45 AM  Result Value Ref Range   WBC 9.3 4.0 - 10.5 K/uL   RBC 2.86 (L) 3.87 - 5.11 MIL/uL   Hemoglobin 7.1 (L) 12.0 - 15.0 g/dL   HCT 23.5 (L) 36.0 - 46.0 %   MCV 82.2 80.0 - 100.0 fL   MCH 24.8 (L) 26.0 - 34.0 pg   MCHC 30.2 30.0 - 36.0 g/dL   RDW 14.9 11.5 - 15.5 %   Platelets 591 (H) 150 - 400 K/uL   nRBC 0.0 0.0 - 0.2 %   Neutrophils Relative % 70 %   Neutro Abs 6.5 1.7 - 7.7 K/uL   Lymphocytes Relative 18 %   Lymphs Abs 1.7 0.7 - 4.0 K/uL   Monocytes Relative 11 %   Monocytes Absolute 1.0 0.1 - 1.0 K/uL   Eosinophils Relative 1 %   Eosinophils Absolute 0.1 0.0 - 0.5 K/uL   Basophils Relative 0 %   Basophils Absolute 0.0 0.0 - 0.1 K/uL   Immature Granulocytes 0 %   Abs Immature Granulocytes 0.03 0.00 - 0.07 K/uL    Comment: Performed at [facility], [address] [place], [place] [address]  Comprehensive metabolic panel     Status: Abnormal   Collection Time: 02/23/22  4:45 AM  Result Value Ref Range   Sodium 141 135 - 145 mmol/L   Potassium 3.8 3.5 - 5.1 mmol/L   Chloride 104 98 - 111 mmol/L   CO2 25 22 - 32 mmol/L   Glucose, Bld 150 (H) 70 - 99 mg/dL    Comment: Glucose reference range applies only to samples taken after fasting for at least 8 hours.   BUN 12 8 - 23 mg/dL   Creatinine, Ser 0.66 0.44 - 1.00 mg/dL   Calcium 9.2 8.9 - 10.3 mg/dL   Total Protein 6.2 (L) 6.5 - 8.1 g/dL   Albumin 2.7 (  L) 3.5 - 5.0 g/dL   AST 17 15 - 41 U/L   ALT 11 0 - 44 U/L   Alkaline Phosphatase 91 38 - 126 U/L   Total Bilirubin 0.5 0.3 - 1.2 mg/dL   GFR, Estimated >60 >60 mL/min    Comment: (NOTE) Calculated using the CKD-EPI Creatinine Equation (2021)    Anion gap 12 5 - 15    Comment: Performed at [facility], [address] [place], [place] [address]  Magnesium     Status: None   Collection Time: 02/23/22  4:45 AM  Result Value Ref Range   Magnesium 1.7 1.7 -  2.4 mg/dL    Comment: Performed at [facility], [address] [place], [place] [address]  CBG monitoring, ED     Status: Abnormal   Collection Time: 02/23/22  7:35 AM  Result Value Ref Range   Glucose-Capillary 137 (H) 70 - 99 mg/dL    Comment: Glucose reference range applies only to samples taken after fasting for at least 8 hours.   Comment 1 Notify RN    Comment 2 Document in Chart   CBC     Status: Abnormal   Collection Time: 02/23/22 11:00 AM  Result Value Ref Range   WBC 7.9 4.0 - 10.5 K/uL   RBC 2.74 (L) 3.87 - 5.11 MIL/uL   Hemoglobin 6.9 (LL) 12.0 - 15.0 g/dL    Comment: REPEATED TO VERIFY THIS CRITICAL RESULT HAS VERIFIED AND BEEN CALLED TO ALEXA BRITT,RN BY ZELDA BEECH ON 07 04 2023 AT 1149, AND HAS BEEN READ BACK.     HCT 22.3 (L) 36.0 - 46.0 %   MCV 81.4 80.0 - 100.0 fL   MCH 25.2 (L) 26.0 - 34.0 pg   MCHC 30.9 30.0 - 36.0 g/dL   RDW 15.2 11.5 - 15.5 %   Platelets 574 (H) 150 - 400 K/uL   nRBC 0.0 0.0 - 0.2 %    Comment: Performed at [facility], [address] [place], [place] [address]  CBG monitoring, ED     Status: Abnormal   Collection Time: 02/23/22 11:27 AM  Result Value Ref Range   Glucose-Capillary 126 (H) 70 - 99 mg/dL    Comment: Glucose reference range applies only to samples taken after fasting for at least 8 hours.    DG Femur Portable Min 2 Views Right  Result Date: 02/23/2022 CLINICAL DATA:  Recent fall with known femoral fracture EXAM: RIGHT FEMUR PORTABLE 2 VIEW COMPARISON:  Films from the previous day. FINDINGS: Comminuted periprosthetic femoral fracture is noted. Angulation at the fracture site is seen. No other focal abnormality is noted. IMPRESSION: Prosthetic right femoral fracture. Electronically Signed   By: Mark  Lukens M.D.   On: 02/23/2022 01:20   CT HEAD WO CONTRAST  Result Date: 02/23/2022 CLINICAL DATA:  Polytrauma, blunt.  Unwitnessed fall. EXAM: CT HEAD WITHOUT CONTRAST CT CERVICAL SPINE WITHOUT CONTRAST TECHNIQUE:  Multidetector CT imaging of the head and cervical spine was performed following the standard protocol without intravenous contrast. Multiplanar CT image reconstructions of the cervical spine were also generated. RADIATION DOSE REDUCTION: This exam was performed according to the departmental dose-optimization program which includes automated exposure control, adjustment of the mA and/or kV according to patient size and/or use of iterative reconstruction technique. COMPARISON:  02/01/2022. FINDINGS: CT HEAD FINDINGS Brain: No acute intracranial hemorrhage, midline shift or mass effect. No extra-axial fluid collection. Mild generalized atrophy is noted. Subcortical and periventricular   white matter hypodensities are present bilaterally. There is stable encephalomalacia in the temporal lobe and parieto-occipital region on the left, compatible with old infarct. Senescent calcifications are present in the basal ganglia. Vascular: Atherosclerotic calcification of the carotid siphons. No hyperdense vessel. Skull: Normal. Negative for fracture or focal lesion. Sinuses/Orbits: No acute finding. Other: None. CT CERVICAL SPINE FINDINGS Alignment: Normal. Skull base and vertebrae: No acute fracture. No primary bone lesion or focal pathologic process. Soft tissues and spinal canal: No prevertebral fluid or swelling. No visible canal hematoma. Disc levels: Multilevel intervertebral disc space narrowing, osteophyte formation, and facet arthropathy. Upper chest: Negative. Other: Carotid artery calcifications. IMPRESSION: 1. No acute intracranial process. 2. Atrophy with extensive chronic microvascular ischemic changes and old left MCA territory infarct. 3. Multilevel degenerative changes in the cervical spine without evidence of acute fracture. Electronically Signed   By: Laura  Taylor M.D.   On: 02/23/2022 00:26   CT CERVICAL SPINE WO CONTRAST  Result Date: 02/23/2022 CLINICAL DATA:  Polytrauma, blunt.  Unwitnessed fall. EXAM: CT  HEAD WITHOUT CONTRAST CT CERVICAL SPINE WITHOUT CONTRAST TECHNIQUE: Multidetector CT imaging of the head and cervical spine was performed following the standard protocol without intravenous contrast. Multiplanar CT image reconstructions of the cervical spine were also generated. RADIATION DOSE REDUCTION: This exam was performed according to the departmental dose-optimization program which includes automated exposure control, adjustment of the mA and/or kV according to patient size and/or use of iterative reconstruction technique. COMPARISON:  02/01/2022. FINDINGS: CT HEAD FINDINGS Brain: No acute intracranial hemorrhage, midline shift or mass effect. No extra-axial fluid collection. Mild generalized atrophy is noted. Subcortical and periventricular white matter hypodensities are present bilaterally. There is stable encephalomalacia in the temporal lobe and parieto-occipital region on the left, compatible with old infarct. Senescent calcifications are present in the basal ganglia. Vascular: Atherosclerotic calcification of the carotid siphons. No hyperdense vessel. Skull: Normal. Negative for fracture or focal lesion. Sinuses/Orbits: No acute finding. Other: None. CT CERVICAL SPINE FINDINGS Alignment: Normal. Skull base and vertebrae: No acute fracture. No primary bone lesion or focal pathologic process. Soft tissues and spinal canal: No prevertebral fluid or swelling. No visible canal hematoma. Disc levels: Multilevel intervertebral disc space narrowing, osteophyte formation, and facet arthropathy. Upper chest: Negative. Other: Carotid artery calcifications. IMPRESSION: 1. No acute intracranial process. 2. Atrophy with extensive chronic microvascular ischemic changes and old left MCA territory infarct. 3. Multilevel degenerative changes in the cervical spine without evidence of acute fracture. Electronically Signed   By: Laura  Taylor M.D.   On: 02/23/2022 00:26   DG Hip Port Unilat W or Wo Pelvis 1 View  Right  Result Date: 02/23/2022 CLINICAL DATA:  Recent fall with hip pain, initial encounter EXAM: DG HIP (WITH OR WITHOUT PELVIS) 1V PORT RIGHT COMPARISON:  02/02/2022 FINDINGS: Right hip replacement is noted. Cerclage wire is again seen. There is an oblique periprosthetic fracture of the proximal to mid femur identified although incompletely evaluated on this exam. Ring is intact. IMPRESSION: Oblique periprosthetic proximal right femoral fracture. Femur films are recommended for further evaluation. Electronically Signed   By: Mark  Lukens M.D.   On: 02/23/2022 00:01   DG Chest Port 1 View  Result Date: 02/23/2022 CLINICAL DATA:  Recent fall with known femoral fracture, initial encounter EXAM: PORTABLE CHEST 1 VIEW COMPARISON:  02/03/2022 FINDINGS: Cardiac shadow is stable. The lungs are well aerated bilaterally. No focal infiltrate or effusion is seen. No acute bony abnormality is noted. IMPRESSION: No active disease. Electronically Signed     By: Alcide Clever M.D.   On: 02/23/2022 00:00   DG Knee Right Port  Result Date: 02/22/2022 CLINICAL DATA:  And fall with knee pain, initial encounter EXAM: PORTABLE RIGHT KNEE - 2 VIEW COMPARISON:  None Available. FINDINGS: Prior proximal fibular fractures are seen with healing. Degenerative changes of the knee joint are noted. No joint effusion is seen. Fracture in the midshaft of the femur is noted incompletely evaluated on this exam. IMPRESSION: Midshaft right femoral fracture incompletely evaluated on this exam. Degenerative changes of the right knee joint. Electronically Signed   By: Alcide Clever M.D.   On: 02/22/2022 23:59    Review of Systems  Reason unable to perform ROS: non-verbal at baseline.  ROS per HPI - obtained with help of daughter at bedside  Blood pressure (!) 100/56, pulse 94, temperature 97.6 F (36.4 C), temperature source Oral, resp. rate (!) 23, height 5\' 5"  (1.651 m), weight 102 kg, SpO2 100 %. Physical Exam Cardiovascular:     Pulses:           Femoral pulses are 2+ on the right side and 2+ on the left side.      Dorsalis pedis pulses are 2+ on the right side and 2+ on the left side.  Musculoskeletal:     Right hip: Deformity (RLE externally rotated and shortened) and tenderness present. Decreased range of motion.     Left hip: Normal.  Neurological:     Mental Status: She is alert. Mental status is at baseline.     Sensory: Sensation is intact.     Assessment/Plan:  Periprosthetic fracture of the right hip  Plan: Dr. spoke with Dr. Victorino Dike to discuss patient case. Plan to transfer to [facility] for surgical intervention tomorrow (7/5). NPO after midnight tonight. No pharmacologic anticoagulation leading up to surgery.  Holding outpatient Plavix for now.  SCDs. Continue pain management per hospitalist.  11-05-1978, PA-C 02/23/2022, 12:00 PM

## 2022-02-23 NOTE — Assessment & Plan Note (Signed)
  #)   Ground level fall: As described above, the details relating to the patient's fall out of her bed at SNF earlier today are not entirely clear, but appears to have resulted in the patient's presenting acute right periprosthetic hip fracture, as above.  As noted above, unclear if there is any loss of consciousness unclear if the patient hit her head as a component of this fall.  As the patient is on antiplatelet therapy, she underwent CT head, which showed no evidence of acute intracranial process, including no evidence of intracranial hemorrhage, will CT cervical spine showed no evidence of acute cervical spine fracture or subluxation injury.  Particular given the unclear nature/mechanism of patient's precipitating fall at SNF earlier today, will also check urinalysis to evaluate for any additional organic contributing factors.    Plan: Check urinalysis, as above.  Repeat BMP and CBC with differential in the morning. Fall precautions.

## 2022-02-23 NOTE — ED Notes (Signed)
RN spoke with Rebecca Barnes regarding pt Hgb level. Daughter made aware pt is unable to consent due to orientation. Daughter is agreeable and consents to blood transfusion. [name] MD received verbal consent for transfusion. This RN and Designer, fashion/clothing received verbal consent over the phone for blood transfusion.

## 2022-02-23 NOTE — Assessment & Plan Note (Signed)
  #)   Subacute blood loss anemia: Patient developed new onset blood loss anemia in June 2022 prompting most recent hospitalization in the setting of acute gastrointestinal bleed.  Since that time, her hemoglobin has been in the range of 7-8, with hemoglobin this evening consistent with this range, and noted to be associated with normocytic/normochromic properties as well as nonelevated RDW, which is reassuring from a rebleeding standpoint.  No evidence of active bleed at this time.  She is on antiplatelet therapy as an outpatient, as noted above.  INR noted to be 1.2.  Plan: Repeat CBC in the morning.  Given anticipated surgical intervention on 02/24/2022 for presenting acute right periprosthetic fracture of the right hip, will also order type and screen at this time.

## 2022-02-23 NOTE — Progress Notes (Signed)
Pt has arrived from [facility] emergency department via care link.  Pt is alert and disoriented.  Report accepeted from Alexa,RN via secure  chat.  Pts's daughter, Rebecca Barnes called and notified of pt's arrival to unit.  Pt's call bell placed within reach.  Will continue to monitor.

## 2022-02-23 NOTE — Assessment & Plan Note (Signed)
 #)   Hypokalemia: Presenting serum potassium level noted to be 3.4.  Plan: Potassium chloride 40 mill equivalents p.o. x1 dose now.  Add on serum magnesium level.  Repeat CMP in the morning.

## 2022-02-23 NOTE — H&P (Signed)
History and Physical    PLEASE NOTE THAT DRAGON DICTATION SOFTWARE WAS USED IN THE CONSTRUCTION OF THIS NOTE.   Rebecca Barnes VOJ:[id number] DOB: 03-31-1946 DOA: 02/22/2022  PCP: Sandrea Hughs, NP  Patient coming from: SNF  I have personally briefly reviewed patient's old medical records in [place]  Chief Complaint: Fall  HPI: Rebecca Barnes is a 76 y.o. 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   This patient is nonverbal at baseline, the following history is evaluated by EMS as well as my discussions with the EDP and via chart review.  Patient reportedly fell out of her bed at SNF on 02/22/2022, although the details of this fall are unclear at this time.  Unclear if this was a witnessed fall, and is also clear the patient and her head as a component of the fall.  EMS responded to call for patient for home, and upon arrival at SNF noted that the patient was back in her bed.  On evaluation, they noted the patient's right lower extremity to be showing evidence of external rotation prompting the patient to be brought to [facility] emergency department for further evaluation and management thereof.   She recently underwent right total hip arthroplasty with Dr. Lyla Glassing [facility] on 02/02/2022.  She was subsequently discharged back to SNF, before being readmitted for acute gastrointestinal bleed.  Throughout the second half of June 2020, her hemoglobin has been in the range of 7-8, with most recent prior hemoglobin noted to be 8.0 on 02/15/2022.  She was ultimately discharged back to SNF following recent hospitalization for evaluation of acute GI bleed  associated with acute blood loss anemia.  Per review of records available to me at this time, it appears that she is on daily Plavix as an outpatient.  Not on any formal anticoagulation.     [facility] ED Course:  Vital signs in the ED were notable for the following: Afebrile; heart rate 85-97; blood pressure 106/55 - 125/57; respiratory rate 14-20, oxygen saturation 95 to 97% on room air.  Labs were notable for the following: CMP notable for the following: Sodium 138, potassium 3.2, BUN 13 compared to most recent prior value of 12 on 02/15/2022, creatinine 0.64, glucose 164, calcium, corrected for mild hypoalbuminemia noted to be 9.8, albumin 2.7, otherwise liver enzymes remain to be within normal limits.  CBC notable for white blood cell count 12,900, hemoglobin 7.4 associated with normocytic/normochromic properties as well as nonelevated RDW, platelet count 554.  INR 1.2.  Urinalysis ordered, with result currently pending.  COVID-19/influenza PCR negative.  Imaging and additional notable ED work-up: EKG shows sinus rhythm with heart rate 90, normal intervals, no evidence of T wave or ST changes, including no evidence of ST elevation.  Chest x-ray shows no evidence of acute cardiopulmonary process, including no evidence of infiltrate, edema, effusion, or pneumothorax.  Plain films of the right hip and proximal right femur show evidence of periprosthetic proximal right femoral fracture.  CT head shows no evidence of acute intracranial process, including no evidence of intracranial hemorrhage.  CT cervical spine shows no evidence of acute cervical spine fracture.   EDP discussed the patient's case and imaging with  the on-call orthopedic surgeon from [facility], who requested that the patient be admitted to the hospitalist service at [facility] for further evaluation and management of presenting acute periprosthetic fracture of right hip, as Dr. Lyla Glassing operates nearly exclusively out of [facility];  orthopedic surgeon also conveyed that patient will be seen by [facility] on the morning of 02/23/2022 but that ensuing surgical intervention for her acute right periprosthetic hip fracture would likely not occur until 02/24/2022.  While in the ED, the following were administered: None  Subsequently, the patient was admitted to [facility] for further evaluation and management of acute periprosthetic fracture of the right hip following a reported ground-level fall at SNF earlier in the day.     Review of Systems: As per HPI otherwise 10 point review of systems negative.   Past Medical History:  Diagnosis Date   Anxiety    Asthma    Diabetes mellitus without complication ([place])    Hypertension    Stroke [facility])     Past Surgical History:  Procedure Laterality Date   arm fracture     arm surgery   CHOLECYSTECTOMY     TOTAL HIP ARTHROPLASTY Right 02/02/2022   Procedure: TOTAL HIP ARTHROPLASTY ANTERIOR APPROACH;  Surgeon: Rod Can, MD;  Location: [facility];  Service: Orthopedics;  Laterality: Right;    Social History:  reports that she has never smoked. She has never used smokeless tobacco. She reports that she does not currently use drugs after having used the following drugs: Marijuana. She reports that she does not drink alcohol.   Allergies  Allergen Reactions   Januvia [Sitagliptin] Other (See Comments)    dizziness   Statins Other (See Comments)    Hospitalized on 2 occasions after trying statins. Patient became dizzy and increased fall risk   Sulfa Antibiotics Other (See Comments)    Childhood Allergy     Family History  Problem Relation Age of Onset   Cancer Mother     Family history reviewed and not pertinent    Prior to Admission medications   Medication Sig Start Date End Date Taking? Authorizing Provider  acetaminophen (TYLENOL) 325 MG tablet Take 650 mg by mouth every 6 (six) hours as needed for mild pain or headache. 06/13/20   [provider]   amLODipine (NORVASC) 5 MG tablet TAKE 1 TABLET(5 MG) BY MOUTH DAILY Patient taking differently: Take 5 mg by mouth every morning. 08/28/21   Ngetich, Nelda Bucks, NP  aspirin 81 MG chewable tablet Chew 1 tablet (81 mg total) by mouth 2 (two) times daily with a meal. 02/21/22 04/07/22  Darliss Cheney, MD  bisacodyl (DULCOLAX) 5 MG EC tablet Take 1 tablet (5 mg total) by mouth daily as needed for moderate constipation. 02/09/22   Pokhrel, Corrie Mckusick, MD  camphor-menthol [facility]) lotion Apply topically as needed for itching. Patient taking differently: Apply 1 application  topically daily as needed for itching. 12/11/21   Setzer, Edman Circle, PA-C  clonazePAM (KLONOPIN) 0.5 MG tablet Take 1 tablet (0.5 mg total) by mouth daily as needed for anxiety. 02/15/22 02/15/23  Darliss Cheney, MD  clopidogrel (PLAVIX) 75 MG tablet Take 1 tablet (75 mg total) by mouth daily. 02/19/22   Darliss Cheney, MD  docusate sodium (COLACE) 100 MG capsule Take 1 capsule (100 mg total) by mouth 2 (two) times daily. 02/09/22   Pokhrel, Corrie Mckusick, MD  fenofibrate 54 MG tablet TAKE 1 TABLET(54 MG) BY MOUTH DAILY Patient taking differently: Take 54 mg by mouth  every morning. 01/04/22   Ngetich, Dinah C, NP  FLUoxetine (PROZAC) 40 MG capsule Take one capsule by mouth once daily. Patient taking differently: Take 40 mg by mouth daily. Take one capsule by mouth once daily. 01/04/22   Ngetich, Dinah C, NP  hydrOXYzine (ATARAX) 10 MG tablet TAKE 1 TABLET(10 MG) BY MOUTH EVERY 8 HOURS AS NEEDED FOR ANXIETY Patient taking differently: Take 10 mg by mouth every 8 (eight) hours as needed for anxiety. 01/19/22   Ngetich, Dinah C, NP  losartan (COZAAR) 100 MG tablet TAKE 1 TABLET(100 MG) BY MOUTH DAILY Patient taking differently: Take 100 mg by mouth every morning. 01/20/22   Ngetich, Dinah C, NP  melatonin 10 MG TABS Take 10 mg by mouth at bedtime. 12/11/21   Setzer, Edman Circle, PA-C  metFORMIN (GLUCOPHAGE) 500 MG tablet TAKE 1 TABLET(500 MG) BY MOUTH DAILY WITH  BREAKFAST Patient taking differently: Take 500 mg by mouth daily with breakfast. 01/13/22   Ngetich, Dinah C, NP  Nutritional Supplements (NUTRITIONAL SUPPLEMENT PO) Take 120 mLs by mouth 2 (two) times daily. [facility]    [provider]  ondansetron (ZOFRAN) 4 MG tablet Take 1 tablet (4 mg total) by mouth every 6 (six) hours as needed for nausea. 02/09/22   Pokhrel, Corrie Mckusick, MD  oxyCODONE (OXY IR/ROXICODONE) 5 MG immediate release tablet Take 1 tablet (5 mg total) by mouth every 4 (four) hours as needed for severe pain or moderate pain. 02/15/22   Darliss Cheney, MD  oxymetazoline (AFRIN NASAL SPRAY) 0.05 % nasal spray Spray 1 spray into affected nostril if bleeding is uncontrolled after 20 min of direct pressure Patient taking differently: Place 1 spray into both nostrils See admin instructions. Spray 1 spray into affected nostril as needed for nose bleed -  if bleeding is uncontrolled after 20 min of direct pressure 12/18/21   Kathe Becton R, PA-C  QUEtiapine (SEROQUEL) 25 MG tablet Take 1 tablet (25 mg total) by mouth at bedtime as needed (agitation/sundowning). Patient not taking: Reported on 02/15/2022 02/09/22   Pokhrel, Corrie Mckusick, MD  sodium chloride 1 g tablet TAKE 1 TABLET(1 GRAM) BY MOUTH THREE TIMES DAILY Patient taking differently: Take 1 g by mouth 3 (three) times daily. 11/30/21   Ngetich, Nelda Bucks, NP     Objective    Physical Exam: Vitals:   02/23/22 0145 02/23/22 0200 02/23/22 0215 02/23/22 0245  BP: (!) 108/51 (!) 118/57 (!) 125/57 127/69  Pulse: 87 87 85 85  Resp: 18 15 14 14   Temp:      TempSrc:      SpO2: (!) 85% (!) 89% 97% 95%    General: appears to be stated age; alert; non-verbal (baseline) Skin: warm, dry, no rash Head:  AT/[place] Mouth:  Oral mucosa membranes appear moist, normal dentition Neck: supple; trachea midline Heart:  RRR; did not appreciate any M/R/G Lungs: CTAB, did not appreciate any wheezes, rales, or rhonchi Abdomen: + BS; soft, ND, NT Vascular:  2+ pedal pulses b/l; 2+ radial pulses b/l Extremities: no peripheral edema, no muscle wasting Neuro: non-verbal; spontaneously moving all 4 extremities   Labs on Admission: I have personally reviewed following labs and imaging studies  CBC: Recent Labs  Lab 02/22/22 2337 02/22/22 2347  WBC 12.9*  --   HGB 7.4* 7.8*  HCT 23.8* 23.0*  MCV 83.2  --   PLT 554*  --    Basic Metabolic Panel: Recent Labs  Lab 02/22/22 2337 02/22/22 2347  NA 138 138  K  3.4* 3.4*  CL 107 106  CO2 22  --   GLUCOSE 164* 164*  BUN 13 11  CREATININE 0.64 0.40*  CALCIUM 8.8*  --    GFR: Estimated Creatinine Clearance: 71.9 mL/min (A) (by C-G formula based on SCr of 0.4 mg/dL (L)). Liver Function Tests: Recent Labs  Lab 02/22/22 2337  AST 18  ALT 11  ALKPHOS 89  BILITOT 0.4  PROT 5.9*  ALBUMIN 2.7*   No results for input(s): "LIPASE", "AMYLASE" in the last 168 hours. No results for input(s): "AMMONIA" in the last 168 hours. Coagulation Profile: Recent Labs  Lab 02/22/22 2337  INR 1.2   Cardiac Enzymes: No results for input(s): "CKTOTAL", "CKMB", "CKMBINDEX", "TROPONINI" in the last 168 hours. BNP (last 3 results) No results for input(s): "PROBNP" in the last 8760 hours. HbA1C: No results for input(s): "HGBA1C" in the last 72 hours. CBG: No results for input(s): "GLUCAP" in the last 168 hours. Lipid Profile: No results for input(s): "CHOL", "HDL", "LDLCALC", "TRIG", "CHOLHDL", "LDLDIRECT" in the last 72 hours. Thyroid Function Tests: No results for input(s): "TSH", "T4TOTAL", "FREET4", "T3FREE", "THYROIDAB" in the last 72 hours. Anemia Panel: No results for input(s): "VITAMINB12", "FOLATE", "FERRITIN", "TIBC", "IRON", "RETICCTPCT" in the last 72 hours. Urine analysis:    Component Value Date/Time   COLORURINE YELLOW 02/01/2022 1620   APPEARANCEUR HAZY (A) 02/01/2022 1620   LABSPEC 1.015 02/01/2022 1620   PHURINE 7.0 02/01/2022 1620   GLUCOSEU NEGATIVE 02/01/2022 1620   HGBUR  NEGATIVE 02/01/2022 1620   BILIRUBINUR NEGATIVE 02/01/2022 1620   KETONESUR NEGATIVE 02/01/2022 1620   PROTEINUR NEGATIVE 02/01/2022 1620   NITRITE NEGATIVE 02/01/2022 1620   LEUKOCYTESUR NEGATIVE 02/01/2022 1620    Radiological Exams on Admission: DG Femur Portable Min 2 Views Right  Result Date: 02/23/2022 CLINICAL DATA:  Recent fall with known femoral fracture EXAM: RIGHT FEMUR PORTABLE 2 VIEW COMPARISON:  Films from the previous day. FINDINGS: Comminuted periprosthetic femoral fracture is noted. Angulation at the fracture site is seen. No other focal abnormality is noted. IMPRESSION: Prosthetic right femoral fracture. Electronically Signed   By: Inez Catalina M.D.   On: 02/23/2022 01:20   CT HEAD WO CONTRAST  Result Date: 02/23/2022 CLINICAL DATA:  Polytrauma, blunt.  Unwitnessed fall. EXAM: CT HEAD WITHOUT CONTRAST CT CERVICAL SPINE WITHOUT CONTRAST TECHNIQUE: Multidetector CT imaging of the head and cervical spine was performed following the standard protocol without intravenous contrast. Multiplanar CT image reconstructions of the cervical spine were also generated. RADIATION DOSE REDUCTION: This exam was performed according to the departmental dose-optimization program which includes automated exposure control, adjustment of the mA and/or kV according to patient size and/or use of iterative reconstruction technique. COMPARISON:  02/01/2022. FINDINGS: CT HEAD FINDINGS Brain: No acute intracranial hemorrhage, midline shift or mass effect. No extra-axial fluid collection. Mild generalized atrophy is noted. Subcortical and periventricular white matter hypodensities are present bilaterally. There is stable encephalomalacia in the temporal lobe and parieto-occipital region on the left, compatible with old infarct. Senescent calcifications are present in the basal ganglia. Vascular: Atherosclerotic calcification of the carotid siphons. No hyperdense vessel. Skull: Normal. Negative for fracture or focal  lesion. Sinuses/Orbits: No acute finding. Other: None. CT CERVICAL SPINE FINDINGS Alignment: Normal. Skull base and vertebrae: No acute fracture. No primary bone lesion or focal pathologic process. Soft tissues and spinal canal: No prevertebral fluid or swelling. No visible canal hematoma. Disc levels: Multilevel intervertebral disc space narrowing, osteophyte formation, and facet arthropathy. Upper chest: Negative. Other: Carotid artery  calcifications. IMPRESSION: 1. No acute intracranial process. 2. Atrophy with extensive chronic microvascular ischemic changes and old left MCA territory infarct. 3. Multilevel degenerative changes in the cervical spine without evidence of acute fracture. Electronically Signed   By: Brett Fairy M.D.   On: 02/23/2022 00:26   CT CERVICAL SPINE WO CONTRAST  Result Date: 02/23/2022 CLINICAL DATA:  Polytrauma, blunt.  Unwitnessed fall. EXAM: CT HEAD WITHOUT CONTRAST CT CERVICAL SPINE WITHOUT CONTRAST TECHNIQUE: Multidetector CT imaging of the head and cervical spine was performed following the standard protocol without intravenous contrast. Multiplanar CT image reconstructions of the cervical spine were also generated. RADIATION DOSE REDUCTION: This exam was performed according to the departmental dose-optimization program which includes automated exposure control, adjustment of the mA and/or kV according to patient size and/or use of iterative reconstruction technique. COMPARISON:  02/01/2022. FINDINGS: CT HEAD FINDINGS Brain: No acute intracranial hemorrhage, midline shift or mass effect. No extra-axial fluid collection. Mild generalized atrophy is noted. Subcortical and periventricular white matter hypodensities are present bilaterally. There is stable encephalomalacia in the temporal lobe and parieto-occipital region on the left, compatible with old infarct. Senescent calcifications are present in the basal ganglia. Vascular: Atherosclerotic calcification of the carotid siphons.  No hyperdense vessel. Skull: Normal. Negative for fracture or focal lesion. Sinuses/Orbits: No acute finding. Other: None. CT CERVICAL SPINE FINDINGS Alignment: Normal. Skull base and vertebrae: No acute fracture. No primary bone lesion or focal pathologic process. Soft tissues and spinal canal: No prevertebral fluid or swelling. No visible canal hematoma. Disc levels: Multilevel intervertebral disc space narrowing, osteophyte formation, and facet arthropathy. Upper chest: Negative. Other: Carotid artery calcifications. IMPRESSION: 1. No acute intracranial process. 2. Atrophy with extensive chronic microvascular ischemic changes and old left MCA territory infarct. 3. Multilevel degenerative changes in the cervical spine without evidence of acute fracture. Electronically Signed   By: Brett Fairy M.D.   On: 02/23/2022 00:26   DG Hip Port Unilat W or Wo Pelvis 1 View Right  Result Date: 02/23/2022 CLINICAL DATA:  Recent fall with hip pain, initial encounter EXAM: DG HIP (WITH OR WITHOUT PELVIS) 1V PORT RIGHT COMPARISON:  02/02/2022 FINDINGS: Right hip replacement is noted. Cerclage wire is again seen. There is an oblique periprosthetic fracture of the proximal to mid femur identified although incompletely evaluated on this exam. Ring is intact. IMPRESSION: Oblique periprosthetic proximal right femoral fracture. Femur films are recommended for further evaluation. Electronically Signed   By: Inez Catalina M.D.   On: 02/23/2022 00:01   DG Chest Port 1 View  Result Date: 02/23/2022 CLINICAL DATA:  Recent fall with known femoral fracture, initial encounter EXAM: PORTABLE CHEST 1 VIEW COMPARISON:  02/03/2022 FINDINGS: Cardiac shadow is stable. The lungs are well aerated bilaterally. No focal infiltrate or effusion is seen. No acute bony abnormality is noted. IMPRESSION: No active disease. Electronically Signed   By: Inez Catalina M.D.   On: 02/23/2022 00:00   DG Knee Right Port  Result Date: 02/22/2022 CLINICAL  DATA:  And fall with knee pain, initial encounter EXAM: PORTABLE RIGHT KNEE - 2 VIEW COMPARISON:  None Available. FINDINGS: Prior proximal fibular fractures are seen with healing. Degenerative changes of the knee joint are noted. No joint effusion is seen. Fracture in the midshaft of the femur is noted incompletely evaluated on this exam. IMPRESSION: Midshaft right femoral fracture incompletely evaluated on this exam. Degenerative changes of the right knee joint. Electronically Signed   By: Inez Catalina M.D.   On: 02/22/2022 23:59  EKG: Independently reviewed, with result as described above.    Assessment/Plan   Principal Problem:   Periprosthetic hip fracture Active Problems:   Anxiety   Type 2 diabetes mellitus without complication, without long-term current use of insulin (HCC)   Leukocytosis   Essential hypertension   Hypokalemia   HLD (hyperlipidemia)   Normocytic anemia   Fall at home, initial encounter       #) Acute periprosthetic fracture of the right hip: In the setting of recent right total hip arthroplasty on 02/02/2022 via Dr. Cammy Copa [facility], the patient presents this evening with acute periprosthetic fracture of the right hip after ground-level fall incurred at SNF earlier today, with plain films of the right hip and proximal right femur confirming periprosthetic proximal right femoral fracture.  In this patient who is nonverbal at baseline, she appears comfortable at this time with, right lower extremity appears warm and well-perfused.  She is on antiplatelet therapy as an outpatient, specifically on at least daily Plavix.   EDP discussed the patient's case and imaging with the on-call orthopedic surgeon from [facility], who requested that the patient be admitted to the hospitalist service at [facility] for further evaluation and management of presenting acute periprosthetic fracture of right hip, as Dr. Lyla Glassing operates nearly exclusively out of [facility];  orthopedic surgeon also conveyed that patient will be seen by [facility] on the morning of 02/23/2022 but that ensuing surgical intervention for her acute right periprosthetic hip fracture would likely not occur until 02/24/2022.  Of note, present chest x-ray showed no evidence of acute cardiopulmonary process, while EKG showed sinus rhythm without evidence of acute ischemic changes.  Additional work-up notable for INR, which was found to be 1.2.  Gupta Score for this patient in the context of anticipated aforementioned orthopedic surgery conveys a   2.05% perioperative risk for significant cardiac event. No evidence to suggest acutely decompensated heart failure or acute MI. Consequently, no absolute contraindications to proceeding with proposed orthopedic surgery at this time.   Plan: formal [facility] consult for definitive surgical management at [facility], with plan to take patient to the OR tomorrow  (7/5).  As patient is not anticipated to go to surgery until tomorrow, will provide diet at this time. No pharmacologic anticoagulation leading up to this anticipated surgery.  Holding outpatient Plavix for now.  SCD's. Prn IV fentanyl. Anticipate postoperative PT consult. type and screen ordered.        #) Ground level fall: As described above, the details relating to the patient's fall out of her bed at SNF earlier today are not entirely clear, but appears to have resulted in the patient's presenting acute right periprosthetic hip fracture, as above.  As noted above, unclear if there is any loss of consciousness unclear if the patient hit her head as a component of this fall.  As the patient is on antiplatelet therapy, she underwent CT head, which showed no evidence of acute intracranial process, including no evidence of intracranial hemorrhage, will CT cervical spine showed no evidence of acute cervical spine fracture or subluxation injury.  Particular given the unclear nature/mechanism of  patient's precipitating fall at SNF earlier today, will also check urinalysis to evaluate for any additional organic contributing factors.    Plan: Check urinalysis, as above.  Repeat BMP and CBC with differential in the morning. Fall precautions.         #) Leukocytosis: Presenting CBC reflects mildly elevated white cell count of  12,900. Suspect that this is reactive in nature in the setting of presenting ground-level fall as well as associated acute right periprosthetic hip fracture.  No evidence to suggest underlying infectious process at this time, including negative nasopharyngeal COVID-19 PCR performed in the ED today, while chest x-ray shows no evidence of acute cardiopulmonary process, including no evidence of infiltrate.  Will check urinalysis to further evaluate for any underlying infectious contribution.  In the absence of any suspected underlying factious process, criteria not currently met for sepsis.  Plan: Check urinalysis, as above.  Repeat CBC with diff in the morning.          #) Hypokalemia: Presenting serum potassium level noted to be 3.4.  Plan: Potassium chloride 40 mill equivalents p.o. x1 dose now.  Add on serum magnesium level.  Repeat CMP in the morning.          #) Generalized anxiety disorder: Documented history of such, on Prozac as well as as needed clonazepam as an outpatient.  Of note, presenting EKG demonstrates no evidence of QTc prolongation.  Plan: Continue outpatient Prozac as well as as needed clonazepam.           #) Type 2 Diabetes Mellitus: documented history of such. Home insulin regimen: None. Home oral hypoglycemic agents: Metformin. presenting blood sugar: 164.  Most recent hemoglobin A1c was noted to be 6.3% on 12/04/2021.   Plan: accuchecks QAC and HS with low dose SSI. hold home oral hypoglycemic agents during this hospitalization.          #) Essential Hypertension: documented h/o such, with outpatient  antihypertensive regimen including losartan, Norvasc.  SBP's in the ED today: Low 100s mmHg. consequently, will hold home antihypertensive medications overnight, with plan to reevaluate timing of resumption of these blood pressure medications in the morning via interval monitoring of BP trend.   Plan: Close monitoring of subsequent BP via routine VS. holding home antihypertensive medications for now, as above.             #) Hyperlipidemia: documented h/o such. On fenofibrate as outpatient.    Plan: continue outpatient fenofibrate.         #) Subacute blood loss anemia: Patient developed new onset blood loss anemia in June 2022 prompting most recent hospitalization in the setting of acute gastrointestinal bleed.  Since that time, her hemoglobin has been in the range of 7-8, with hemoglobin this evening consistent with this range, and noted to be associated with normocytic/normochromic properties as well as nonelevated RDW, which is reassuring from a rebleeding standpoint.  No evidence of active bleed at this time.  She is on antiplatelet therapy as an outpatient, as noted above.  INR noted to be 1.2.  Plan: Repeat CBC in the morning.  Given anticipated surgical intervention on 02/24/2022 for presenting acute right periprosthetic fracture of the right hip, will also order type and screen at this time.       DVT prophylaxis: SCD's   Code Status: DNR (confirmed via documentation from most recent 2 hospitalizations).  Family Communication: none Disposition Plan: Per Rounding Team Consults called: EDP discussed patient's case/imaging with on-call [facility], who will formally consult, as further detailed above;  Admission status: Inpatient to med telemetry at [facility].    PLEASE NOTE THAT DRAGON DICTATION SOFTWARE WAS USED IN THE CONSTRUCTION OF THIS NOTE.   Dolliver DO [facility]  From [place]   02/23/2022, 3:56 AM

## 2022-02-23 NOTE — Consult Note (Signed)
Reason for Consult: Right hip periprosthetic fracture Referring Physician: Zadie Rhine, MD  Rebecca Barnes is an 76 y.o. female with medical history significant for aphasia, nonverbal at baseline, type II diabetes mellitus, hypertension, and hyperlipidemia. She recently underwent right THA with Dr. Linna Caprice on 02/02/2022. She presents to the emergency department after a reported fall out of her bed at SNF on 02/22/2022. Unclear if this was a witnessed fall. Also unclear if the patient hit her head as a component of the fall.  EMS responded to call for patient, and upon arrival at SNF, noted that the patient was back in her bed.  On evaluation, they noted the patient's right lower extremity to be showing evidence of external rotation prompting the patient to be [facility] emergency department for further evaluation.  Daughter, who is at bedside upon evaluation, reports patient is uncomfortable due to hip. She is also very anxious about next steps.  Past Medical History:  Diagnosis Date   Anxiety    Asthma    Diabetes mellitus without complication (HCC)    Hypertension    Stroke [facility])     Past Surgical History:  Procedure Laterality Date   arm fracture     arm surgery   CHOLECYSTECTOMY     TOTAL HIP ARTHROPLASTY Right 02/02/2022   Procedure: TOTAL HIP ARTHROPLASTY ANTERIOR APPROACH;  Surgeon: Samson Frederic, MD;  Location: [facility] OR;  Service: Orthopedics;  Laterality: Right;    Family History  Problem Relation Age of Onset   Cancer Mother     Social History:  reports that she has never smoked. She has never used smokeless tobacco. She reports that she does not currently use drugs after having used the following drugs: Marijuana. She reports that she does not drink alcohol.  Allergies:  Allergies  Allergen Reactions   Januvia [Sitagliptin] Other (See Comments)    dizziness   Statins Other (See Comments)    Hospitalized on 2 occasions after trying statins. Patient became  dizzy and increased fall risk   Sulfa Antibiotics Other (See Comments)    Childhood Allergy  Unknown reaction    Medications: I have reviewed the patient's current medications.  Results for orders placed or performed during the hospital encounter of 02/22/22 (from the past 48 hour(s))  Type and screen [facility]     Status: None   Collection Time: 02/22/22 11:32 PM  Result Value Ref Range   ABO/RH(D) O NEG    Antibody Screen NEG    Sample Expiration      02/25/2022,2359 Performed at [facility], [address] [place], [place] [address]   Comprehensive metabolic panel     Status: Abnormal   Collection Time: 02/22/22 11:37 PM  Result Value Ref Range   Sodium 138 135 - 145 mmol/L   Potassium 3.4 (L) 3.5 - 5.1 mmol/L   Chloride 107 98 - 111 mmol/L   CO2 22 22 - 32 mmol/L   Glucose, Bld 164 (H) 70 - 99 mg/dL    Comment: Glucose reference range applies only to samples taken after fasting for at least 8 hours.   BUN 13 8 - 23 mg/dL   Creatinine, Ser 0.76 0.44 - 1.00 mg/dL   Calcium 8.8 (L) 8.9 - 10.3 mg/dL   Total Protein 5.9 (L) 6.5 - 8.1 g/dL   Albumin 2.7 (L) 3.5 - 5.0 g/dL   AST 18 15 - 41 U/L   ALT 11 0 - 44 U/L  Alkaline Phosphatase 89 38 - 126 U/L   Total Bilirubin 0.4 0.3 - 1.2 mg/dL   GFR, Estimated >60 >60 mL/min    Comment: (NOTE) Calculated using the CKD-EPI Creatinine Equation (2021)    Anion gap 9 5 - 15    Comment: Performed at [facility] [address], [place], [place] [address]  CBC     Status: Abnormal   Collection Time: 02/22/22 11:37 PM  Result Value Ref Range   WBC 12.9 (H) 4.0 - 10.5 K/uL   RBC 2.86 (L) 3.87 - 5.11 MIL/uL   Hemoglobin 7.4 (L) 12.0 - 15.0 g/dL   HCT 23.8 (L) 36.0 - 46.0 %   MCV 83.2 80.0 - 100.0 fL   MCH 25.9 (L) 26.0 - 34.0 pg   MCHC 31.1 30.0 - 36.0 g/dL   RDW 15.0 11.5 - 15.5 %   Platelets 554 (H) 150 - 400 K/uL   nRBC 0.0 0.0 - 0.2 %    Comment: Performed at [facility], [place]  [address], [place], [place] [address]  Ethanol     Status: None   Collection Time: 02/22/22 11:37 PM  Result Value Ref Range   Alcohol, Ethyl (B) <10 <10 mg/dL    Comment: (NOTE) Lowest detectable limit for serum alcohol is 10 mg/dL.  For medical purposes only. Performed at [facility], [place] [address] [place], [place] [address]   Protime-INR     Status: None   Collection Time: 02/22/22 11:37 PM  Result Value Ref Range   Prothrombin Time 14.6 11.4 - 15.2 seconds   INR 1.2 0.8 - 1.2    Comment: (NOTE) INR goal varies based on device and disease states. Performed at [facility], [place] [address] [place], [place] [address]   I-stat chem 8, ed     Status: Abnormal   Collection Time: 02/22/22 11:47 PM  Result Value Ref Range   Sodium 138 135 - 145 mmol/L   Potassium 3.4 (L) 3.5 - 5.1 mmol/L   Chloride 106 98 - 111 mmol/L   BUN 11 8 - 23 mg/dL   Creatinine, Ser 0.40 (L) 0.44 - 1.00 mg/dL   Glucose, Bld 164 (H) 70 - 99 mg/dL    Comment: Glucose reference range applies only to samples taken after fasting for at least 8 hours.   Calcium, Ion 1.14 (L) 1.15 - 1.40 mmol/L   TCO2 22 22 - 32 mmol/L   Hemoglobin 7.8 (L) 12.0 - 15.0 g/dL   HCT 23.0 (L) 36.0 - 46.0 %  Resp Panel by RT-PCR (Flu A&B, Covid) Anterior Nasal Swab     Status: None   Collection Time: 02/23/22  1:39 AM   Specimen: Anterior Nasal Swab  Result Value Ref Range   SARS Coronavirus 2 by RT PCR NEGATIVE NEGATIVE    Comment: (NOTE) SARS-CoV-2 target nucleic acids are NOT DETECTED.  The SARS-CoV-2 RNA is generally detectable in upper respiratory specimens during the acute phase of infection. The lowest concentration of SARS-CoV-2 viral copies this assay can detect is 138 copies/mL. A negative result does not preclude SARS-Cov-2 infection and should not be used as the sole basis for treatment or other patient management decisions. A negative result may occur with  improper specimen collection/handling, submission  of specimen other than nasopharyngeal swab, presence of viral mutation(s) within the areas targeted by this assay, and inadequate number of viral copies(<138 copies/mL). A negative result must be combined with clinical observations, patient  history, and epidemiological information. The expected result is Negative.  Fact Sheet for Patients:  EntrepreneurPulse.com.au  Fact Sheet for Healthcare Providers:  IncredibleEmployment.be  This test is no t yet approved or cleared by the [place] FDA and  has been authorized for detection and/or diagnosis of SARS-CoV-2 by FDA under an Emergency Use Authorization (EUA). This EUA will remain  in effect (meaning this test can be used) for the duration of the COVID-19 declaration under Section 564(b)(1) of the Act, 21 U.S.C.section 360bbb-3(b)(1), unless the authorization is terminated  or revoked sooner.       Influenza A by PCR NEGATIVE NEGATIVE   Influenza B by PCR NEGATIVE NEGATIVE    Comment: (NOTE) The Xpert Xpress SARS-CoV-2/FLU/RSV plus assay is intended as an aid in the diagnosis of influenza from Nasopharyngeal swab specimens and should not be used as a sole basis for treatment. Nasal washings and aspirates are unacceptable for Xpert Xpress SARS-CoV-2/FLU/RSV testing.  Fact Sheet for Patients: EntrepreneurPulse.com.au  Fact Sheet for Healthcare Providers: IncredibleEmployment.be  This test is not yet approved or cleared by the [place] FDA and has been authorized for detection and/or diagnosis of SARS-CoV-2 by FDA under an Emergency Use Authorization (EUA). This EUA will remain in effect (meaning this test can be used) for the duration of the COVID-19 declaration under Section 564(b)(1) of the Act, 21 U.S.C. section 360bbb-3(b)(1), unless the authorization is terminated or revoked.  Performed at [facility], [place] [address], [place],  [place] [address]   CBC with Differential/Platelet     Status: Abnormal   Collection Time: 02/23/22  4:45 AM  Result Value Ref Range   WBC 9.3 4.0 - 10.5 K/uL   RBC 2.86 (L) 3.87 - 5.11 MIL/uL   Hemoglobin 7.1 (L) 12.0 - 15.0 g/dL   HCT 23.5 (L) 36.0 - 46.0 %   MCV 82.2 80.0 - 100.0 fL   MCH 24.8 (L) 26.0 - 34.0 pg   MCHC 30.2 30.0 - 36.0 g/dL   RDW 14.9 11.5 - 15.5 %   Platelets 591 (H) 150 - 400 K/uL   nRBC 0.0 0.0 - 0.2 %   Neutrophils Relative % 70 %   Neutro Abs 6.5 1.7 - 7.7 K/uL   Lymphocytes Relative 18 %   Lymphs Abs 1.7 0.7 - 4.0 K/uL   Monocytes Relative 11 %   Monocytes Absolute 1.0 0.1 - 1.0 K/uL   Eosinophils Relative 1 %   Eosinophils Absolute 0.1 0.0 - 0.5 K/uL   Basophils Relative 0 %   Basophils Absolute 0.0 0.0 - 0.1 K/uL   Immature Granulocytes 0 %   Abs Immature Granulocytes 0.03 0.00 - 0.07 K/uL    Comment: Performed at [facility], [address], [place], [place] [address]  Comprehensive metabolic panel     Status: Abnormal   Collection Time: 02/23/22  4:45 AM  Result Value Ref Range   Sodium 141 135 - 145 mmol/L   Potassium 3.8 3.5 - 5.1 mmol/L   Chloride 104 98 - 111 mmol/L   CO2 25 22 - 32 mmol/L   Glucose, Bld 150 (H) 70 - 99 mg/dL    Comment: Glucose reference range applies only to samples taken after fasting for at least 8 hours.   BUN 12 8 - 23 mg/dL   Creatinine, Ser 0.66 0.44 - 1.00 mg/dL   Calcium 9.2 8.9 - 10.3 mg/dL   Total Protein 6.2 (L) 6.5 - 8.1 g/dL   Albumin 2.7 (  L) 3.5 - 5.0 g/dL   AST 17 15 - 41 U/L   ALT 11 0 - 44 U/L   Alkaline Phosphatase 91 38 - 126 U/L   Total Bilirubin 0.5 0.3 - 1.2 mg/dL   GFR, Estimated >60 >60 mL/min    Comment: (NOTE) Calculated using the CKD-EPI Creatinine Equation (2021)    Anion gap 12 5 - 15    Comment: Performed at [facility] [address] [place], [place] [address]  Magnesium     Status: None   Collection Time: 02/23/22  4:45 AM  Result Value Ref Range   Magnesium 1.7 1.7 -  2.4 mg/dL    Comment: Performed at [facility] [address], [place], [place] [address]  CBG monitoring, ED     Status: Abnormal   Collection Time: 02/23/22  7:35 AM  Result Value Ref Range   Glucose-Capillary 137 (H) 70 - 99 mg/dL    Comment: Glucose reference range applies only to samples taken after fasting for at least 8 hours.   Comment 1 Notify RN    Comment 2 Document in Chart   CBC     Status: Abnormal   Collection Time: 02/23/22 11:00 AM  Result Value Ref Range   WBC 7.9 4.0 - 10.5 K/uL   RBC 2.74 (L) 3.87 - 5.11 MIL/uL   Hemoglobin 6.9 (LL) 12.0 - 15.0 g/dL    Comment: REPEATED TO VERIFY THIS CRITICAL RESULT HAS VERIFIED AND BEEN CALLED TO ALEXA BRITT,RN BY ZELDA BEECH ON 07 04 2023 AT [id number], AND HAS BEEN READ BACK.     HCT 22.3 (L) 36.0 - 46.0 %   MCV 81.4 80.0 - 100.0 fL   MCH 25.2 (L) 26.0 - 34.0 pg   MCHC 30.9 30.0 - 36.0 g/dL   RDW 15.2 11.5 - 15.5 %   Platelets 574 (H) 150 - 400 K/uL   nRBC 0.0 0.0 - 0.2 %    Comment: Performed at [place] [address], [place],  [address]  CBG monitoring, ED     Status: Abnormal   Collection Time: 02/23/22 11:27 AM  Result Value Ref Range   Glucose-Capillary 126 (H) 70 - 99 mg/dL    Comment: Glucose reference range applies only to samples taken after fasting for at least 8 hours.    DG Femur Portable Min 2 Views Right  Result Date: 02/23/2022 CLINICAL DATA:  Recent fall with known femoral fracture EXAM: RIGHT FEMUR PORTABLE 2 VIEW COMPARISON:  Films from the previous day. FINDINGS: Comminuted periprosthetic femoral fracture is noted. Angulation at the fracture site is seen. No other focal abnormality is noted. IMPRESSION: Prosthetic right femoral fracture. Electronically Signed   By: Inez Catalina M.D.   On: 02/23/2022 01:20   CT HEAD WO CONTRAST  Result Date: 02/23/2022 CLINICAL DATA:  Polytrauma, blunt.  Unwitnessed fall. EXAM: CT HEAD WITHOUT CONTRAST CT CERVICAL SPINE WITHOUT CONTRAST TECHNIQUE:  Multidetector CT imaging of the head and cervical spine was performed following the standard protocol without intravenous contrast. Multiplanar CT image reconstructions of the cervical spine were also generated. RADIATION DOSE REDUCTION: This exam was performed according to the departmental dose-optimization program which includes automated exposure control, adjustment of the mA and/or kV according to patient size and/or use of iterative reconstruction technique. COMPARISON:  02/01/2022. FINDINGS: CT HEAD FINDINGS Brain: No acute intracranial hemorrhage, midline shift or mass effect. No extra-axial fluid collection. Mild generalized atrophy is noted. Subcortical and periventricular  white matter hypodensities are present bilaterally. There is stable encephalomalacia in the temporal lobe and parieto-occipital region on the left, compatible with old infarct. Senescent calcifications are present in the basal ganglia. Vascular: Atherosclerotic calcification of the carotid siphons. No hyperdense vessel. Skull: Normal. Negative for fracture or focal lesion. Sinuses/Orbits: No acute finding. Other: None. CT CERVICAL SPINE FINDINGS Alignment: Normal. Skull base and vertebrae: No acute fracture. No primary bone lesion or focal pathologic process. Soft tissues and spinal canal: No prevertebral fluid or swelling. No visible canal hematoma. Disc levels: Multilevel intervertebral disc space narrowing, osteophyte formation, and facet arthropathy. Upper chest: Negative. Other: Carotid artery calcifications. IMPRESSION: 1. No acute intracranial process. 2. Atrophy with extensive chronic microvascular ischemic changes and old left MCA territory infarct. 3. Multilevel degenerative changes in the cervical spine without evidence of acute fracture. Electronically Signed   By: Brett Fairy M.D.   On: 02/23/2022 00:26   CT CERVICAL SPINE WO CONTRAST  Result Date: 02/23/2022 CLINICAL DATA:  Polytrauma, blunt.  Unwitnessed fall. EXAM: CT  HEAD WITHOUT CONTRAST CT CERVICAL SPINE WITHOUT CONTRAST TECHNIQUE: Multidetector CT imaging of the head and cervical spine was performed following the standard protocol without intravenous contrast. Multiplanar CT image reconstructions of the cervical spine were also generated. RADIATION DOSE REDUCTION: This exam was performed according to the departmental dose-optimization program which includes automated exposure control, adjustment of the mA and/or kV according to patient size and/or use of iterative reconstruction technique. COMPARISON:  02/01/2022. FINDINGS: CT HEAD FINDINGS Brain: No acute intracranial hemorrhage, midline shift or mass effect. No extra-axial fluid collection. Mild generalized atrophy is noted. Subcortical and periventricular white matter hypodensities are present bilaterally. There is stable encephalomalacia in the temporal lobe and parieto-occipital region on the left, compatible with old infarct. Senescent calcifications are present in the basal ganglia. Vascular: Atherosclerotic calcification of the carotid siphons. No hyperdense vessel. Skull: Normal. Negative for fracture or focal lesion. Sinuses/Orbits: No acute finding. Other: None. CT CERVICAL SPINE FINDINGS Alignment: Normal. Skull base and vertebrae: No acute fracture. No primary bone lesion or focal pathologic process. Soft tissues and spinal canal: No prevertebral fluid or swelling. No visible canal hematoma. Disc levels: Multilevel intervertebral disc space narrowing, osteophyte formation, and facet arthropathy. Upper chest: Negative. Other: Carotid artery calcifications. IMPRESSION: 1. No acute intracranial process. 2. Atrophy with extensive chronic microvascular ischemic changes and old left MCA territory infarct. 3. Multilevel degenerative changes in the cervical spine without evidence of acute fracture. Electronically Signed   By: Brett Fairy M.D.   On: 02/23/2022 00:26   DG Hip Port Unilat W or Wo Pelvis 1 View  Right  Result Date: 02/23/2022 CLINICAL DATA:  Recent fall with hip pain, initial encounter EXAM: DG HIP (WITH OR WITHOUT PELVIS) 1V PORT RIGHT COMPARISON:  02/02/2022 FINDINGS: Right hip replacement is noted. Cerclage wire is again seen. There is an oblique periprosthetic fracture of the proximal to mid femur identified although incompletely evaluated on this exam. Ring is intact. IMPRESSION: Oblique periprosthetic proximal right femoral fracture. Femur films are recommended for further evaluation. Electronically Signed   By: Inez Catalina M.D.   On: 02/23/2022 00:01   DG Chest Port 1 View  Result Date: 02/23/2022 CLINICAL DATA:  Recent fall with known femoral fracture, initial encounter EXAM: PORTABLE CHEST 1 VIEW COMPARISON:  02/03/2022 FINDINGS: Cardiac shadow is stable. The lungs are well aerated bilaterally. No focal infiltrate or effusion is seen. No acute bony abnormality is noted. IMPRESSION: No active disease. Electronically Signed  By: Alcide Clever M.D.   On: 02/23/2022 00:00   DG Knee Right Port  Result Date: 02/22/2022 CLINICAL DATA:  And fall with knee pain, initial encounter EXAM: PORTABLE RIGHT KNEE - 2 VIEW COMPARISON:  None Available. FINDINGS: Prior proximal fibular fractures are seen with healing. Degenerative changes of the knee joint are noted. No joint effusion is seen. Fracture in the midshaft of the femur is noted incompletely evaluated on this exam. IMPRESSION: Midshaft right femoral fracture incompletely evaluated on this exam. Degenerative changes of the right knee joint. Electronically Signed   By: Alcide Clever M.D.   On: 02/22/2022 23:59    Review of Systems  Reason unable to perform ROS: non-verbal at baseline.  ROS per HPI - obtained with help of daughter at bedside  Blood pressure (!) 100/56, pulse 94, temperature 97.6 F (36.4 C), temperature source Oral, resp. rate (!) 23, height 5\' 5"  (1.651 m), weight 102 kg, SpO2 100 %. Physical Exam Cardiovascular:     Pulses:           Femoral pulses are 2+ on the right side and 2+ on the left side.      Dorsalis pedis pulses are 2+ on the right side and 2+ on the left side.  Musculoskeletal:     Right hip: Deformity (RLE externally rotated and shortened) and tenderness present. Decreased range of motion.     Left hip: Normal.  Neurological:     Mental Status: She is alert. Mental status is at baseline.     Sensory: Sensation is intact.     Assessment/Plan:  Periprosthetic fracture of the right hip  Plan: Dr. spoke with Dr. Victorino Dike to discuss patient case. Plan to transfer to [facility] for surgical intervention tomorrow (7/5). NPO after midnight tonight. No pharmacologic anticoagulation leading up to surgery.  Holding outpatient Plavix for now.  SCDs. Continue pain management per hospitalist.  11-05-1978, PA-C 02/23/2022, 12:00 PM

## 2022-02-23 NOTE — Progress Notes (Signed)
Orthopedic Tech Progress Note Patient Details:  Rebecca Barnes [birth year] [id number]  Patient ID: Rebecca Barnes, female   DOB: 1946/05/14, 76 y.o.   MRN: [id number] I attended trauma page. Trinna Post 02/23/2022, 12:11 AM

## 2022-02-23 NOTE — Assessment & Plan Note (Signed)
   #)   Hyperlipidemia: documented h/o such. On fenofibrate as outpatient.    Plan: continue outpatient fenofibrate.

## 2022-02-23 NOTE — ED Notes (Signed)
MD at [facility]. Family arriving to room.

## 2022-02-23 NOTE — Assessment & Plan Note (Signed)
  #)   Leukocytosis: Presenting CBC reflects mildly elevated white cell count of 12,900. Suspect that this is reactive in nature in the setting of presenting ground-level fall as well as associated acute right periprosthetic hip fracture.  No evidence to suggest underlying infectious process at this time, including negative nasopharyngeal COVID-19 PCR performed in the ED today, while chest x-ray shows no evidence of acute cardiopulmonary process, including no evidence of infiltrate.  Will check urinalysis to further evaluate for any underlying infectious contribution.  In the absence of any suspected underlying factious process, criteria not currently met for sepsis.  Plan: Check urinalysis, as above.  Repeat CBC with diff in the morning.

## 2022-02-24 ENCOUNTER — Encounter (HOSPITAL_COMMUNITY): Payer: Self-pay | Admitting: Anesthesiology

## 2022-02-24 ENCOUNTER — Other Ambulatory Visit: Payer: Self-pay

## 2022-02-24 ENCOUNTER — Encounter (HOSPITAL_COMMUNITY)
Admission: EM | Disposition: A | Discharge status: Skilled Nursing Facility | Payer: Self-pay | Source: Skilled Nursing Facility | Attending: Student

## 2022-02-24 ENCOUNTER — Ambulatory Visit: Payer: Medicare Other | Admitting: Neurology

## 2022-02-24 ENCOUNTER — Inpatient Hospital Stay (HOSPITAL_COMMUNITY): Payer: Medicare Other | Admitting: Certified Registered Nurse Anesthetist

## 2022-02-24 ENCOUNTER — Inpatient Hospital Stay (HOSPITAL_COMMUNITY): Payer: Medicare Other

## 2022-02-24 ENCOUNTER — Encounter (HOSPITAL_COMMUNITY): Payer: Self-pay | Admitting: Internal Medicine

## 2022-02-24 DIAGNOSIS — I10 Essential (primary) hypertension: Secondary | ICD-10-CM | POA: Diagnosis not present

## 2022-02-24 DIAGNOSIS — E1151 Type 2 diabetes mellitus with diabetic peripheral angiopathy without gangrene: Secondary | ICD-10-CM

## 2022-02-24 DIAGNOSIS — Z7984 Long term (current) use of oral hypoglycemic drugs: Secondary | ICD-10-CM

## 2022-02-24 DIAGNOSIS — F419 Anxiety disorder, unspecified: Secondary | ICD-10-CM | POA: Diagnosis not present

## 2022-02-24 DIAGNOSIS — W19XXXA Unspecified fall, initial encounter: Secondary | ICD-10-CM | POA: Diagnosis not present

## 2022-02-24 DIAGNOSIS — M978XXA Periprosthetic fracture around other internal prosthetic joint, initial encounter: Secondary | ICD-10-CM | POA: Diagnosis not present

## 2022-02-24 DIAGNOSIS — Y92129 Unspecified place in nursing home as the place of occurrence of the external cause: Secondary | ICD-10-CM

## 2022-02-24 DIAGNOSIS — M9701XA Periprosthetic fracture around internal prosthetic right hip joint, initial encounter: Secondary | ICD-10-CM

## 2022-02-24 HISTORY — PX: TOTAL HIP REVISION: SHX763

## 2022-02-24 LAB — CBC
HCT: 29.7 % — ABNORMAL LOW (ref 36.0–46.0)
HCT: 32.3 % — ABNORMAL LOW (ref 36.0–46.0)
Hemoglobin: 10.5 g/dL — ABNORMAL LOW (ref 12.0–15.0)
Hemoglobin: 9.3 g/dL — ABNORMAL LOW (ref 12.0–15.0)
MCH: 25.9 pg — ABNORMAL LOW (ref 26.0–34.0)
MCH: 26.6 pg (ref 26.0–34.0)
MCHC: 31.3 g/dL (ref 30.0–36.0)
MCHC: 32.5 g/dL (ref 30.0–36.0)
MCV: 82 fL (ref 80.0–100.0)
MCV: 82.7 fL (ref 80.0–100.0)
Platelets: 413 10*3/uL — ABNORMAL HIGH (ref 150–400)
Platelets: 544 10*3/uL — ABNORMAL HIGH (ref 150–400)
RBC: 3.59 MIL/uL — ABNORMAL LOW (ref 3.87–5.11)
RBC: 3.94 MIL/uL (ref 3.87–5.11)
RDW: 15.4 % (ref 11.5–15.5)
RDW: 17 % — ABNORMAL HIGH (ref 11.5–15.5)
WBC: 10.4 10*3/uL (ref 4.0–10.5)
WBC: 17.9 10*3/uL — ABNORMAL HIGH (ref 4.0–10.5)
nRBC: 0 % (ref 0.0–0.2)
nRBC: 0 % (ref 0.0–0.2)

## 2022-02-24 LAB — POCT I-STAT, CHEM 8
BUN: 4 mg/dL — ABNORMAL LOW (ref 8–23)
Calcium, Ion: 1.21 mmol/L (ref 1.15–1.40)
Chloride: 100 mmol/L (ref 98–111)
Creatinine, Ser: 0.3 mg/dL — ABNORMAL LOW (ref 0.44–1.00)
Glucose, Bld: 136 mg/dL — ABNORMAL HIGH (ref 70–99)
HCT: 30 % — ABNORMAL LOW (ref 36.0–46.0)
Hemoglobin: 10.2 g/dL — ABNORMAL LOW (ref 12.0–15.0)
Potassium: 3.5 mmol/L (ref 3.5–5.1)
Sodium: 136 mmol/L (ref 135–145)
TCO2: 24 mmol/L (ref 22–32)

## 2022-02-24 LAB — BPAM RBC
Blood Product Expiration Date: 202307052359
Blood Product Expiration Date: 202307062359
ISSUE DATE / TIME: 202307041318
Unit Type and Rh: 9500
Unit Type and Rh: 9500

## 2022-02-24 LAB — RENAL FUNCTION PANEL
Albumin: 3 g/dL — ABNORMAL LOW (ref 3.5–5.0)
Anion gap: 7 (ref 5–15)
BUN: 7 mg/dL — ABNORMAL LOW (ref 8–23)
CO2: 24 mmol/L (ref 22–32)
Calcium: 8.8 mg/dL — ABNORMAL LOW (ref 8.9–10.3)
Chloride: 107 mmol/L (ref 98–111)
Creatinine, Ser: 0.53 mg/dL (ref 0.44–1.00)
GFR, Estimated: >60 mL/min (ref >60)
Glucose, Bld: 147 mg/dL — ABNORMAL HIGH (ref 70–99)
Phosphorus: 2.8 mg/dL (ref 2.5–4.6)
Potassium: 4.2 mmol/L (ref 3.5–5.1)
Sodium: 138 mmol/L (ref 135–145)

## 2022-02-24 LAB — MAGNESIUM: Magnesium: 1.7 mg/dL (ref 1.7–2.4)

## 2022-02-24 LAB — TYPE AND SCREEN
ABO/RH(D): O NEG
Antibody Screen: NEGATIVE
Unit division: 0
Unit division: 0

## 2022-02-24 LAB — GLUCOSE, CAPILLARY
Glucose-Capillary: 129 mg/dL — ABNORMAL HIGH (ref 70–99)
Glucose-Capillary: 131 mg/dL — ABNORMAL HIGH (ref 70–99)
Glucose-Capillary: 132 mg/dL — ABNORMAL HIGH (ref 70–99)
Glucose-Capillary: 179 mg/dL — ABNORMAL HIGH (ref 70–99)

## 2022-02-24 LAB — PREPARE RBC (CROSSMATCH)

## 2022-02-24 SURGERY — TOTAL HIP REVISION
Anesthesia: General | Site: Hip | Laterality: Right

## 2022-02-24 SURGERY — OPEN REDUCTION INTERNAL FIXATION (ORIF) DISTAL FEMUR FRACTURE
Anesthesia: Choice | Laterality: Right

## 2022-02-24 MED ORDER — TRANEXAMIC ACID-NACL 1000-0.7 MG/100ML-% IV SOLN
1000.0000 mg | INTRAVENOUS | Status: AC
Start: 2022-02-24 — End: 2022-02-24
  Administered 2022-02-24: 1000 mg via INTRAVENOUS
  Filled 2022-02-24: qty 100

## 2022-02-24 MED ORDER — ONDANSETRON HCL 4 MG/2ML IJ SOLN
INTRAMUSCULAR | Status: DC | PRN
  Administered 2022-02-24: 4 mg via INTRAVENOUS

## 2022-02-24 MED ORDER — SODIUM CHLORIDE 0.9 % IV SOLN: 10.0000 mL/h | Freq: Once | INTRAVENOUS | Status: DC

## 2022-02-24 MED ORDER — KETOROLAC TROMETHAMINE 30 MG/ML IJ SOLN: INTRAMUSCULAR | Status: DC | PRN

## 2022-02-24 MED ORDER — FENTANYL CITRATE (PF) 100 MCG/2ML IJ SOLN
INTRAMUSCULAR | Status: AC
  Filled 2022-02-24: qty 2

## 2022-02-24 MED ORDER — SODIUM CHLORIDE (PF) 0.9 % IJ SOLN: INTRAMUSCULAR | Status: DC | PRN

## 2022-02-24 MED ORDER — HYDROMORPHONE HCL 2 MG/ML IJ SOLN
INTRAMUSCULAR | Status: AC
  Filled 2022-02-24: qty 1

## 2022-02-24 MED ORDER — LACTATED RINGERS IV SOLN: INTRAVENOUS | Status: DC | PRN

## 2022-02-24 MED ORDER — OXYCODONE HCL 5 MG PO TABS: 5.0000 mg | ORAL_TABLET | Freq: Once | ORAL | Status: DC | PRN

## 2022-02-24 MED ORDER — SODIUM CHLORIDE 0.9 % IV SOLN: INTRAVENOUS | Status: DC

## 2022-02-24 MED ORDER — BUPIVACAINE-EPINEPHRINE (PF) 0.25% -1:200000 IJ SOLN: INTRAMUSCULAR | Status: DC | PRN

## 2022-02-24 MED ORDER — METHOCARBAMOL 500 MG IVPB - SIMPLE MED: 500.0000 mg | Freq: Four times a day (QID) | INTRAVENOUS | Status: DC | PRN

## 2022-02-24 MED ORDER — HYDROMORPHONE HCL 1 MG/ML IJ SOLN
INTRAMUSCULAR | Status: DC | PRN
  Administered 2022-02-24 (×2): .5 mg via INTRAVENOUS

## 2022-02-24 MED ORDER — INSULIN ASPART 100 UNIT/ML IJ SOLN
0.0000 [IU] | INTRAMUSCULAR | Status: DC
  Administered 2022-02-25 – 2022-02-26 (×6): 1 [IU] via SUBCUTANEOUS

## 2022-02-24 MED ORDER — CEFAZOLIN SODIUM-DEXTROSE 2-4 GM/100ML-% IV SOLN
2.0000 g | INTRAVENOUS | Status: AC
  Administered 2022-02-24 (×2): 2 g via INTRAVENOUS
  Filled 2022-02-24: qty 100

## 2022-02-24 MED ORDER — FENTANYL CITRATE PF 50 MCG/ML IJ SOSY
25.0000 ug | PREFILLED_SYRINGE | INTRAMUSCULAR | Status: DC | PRN
  Administered 2022-02-25: 25 ug via INTRAVENOUS

## 2022-02-24 MED ORDER — BUPIVACAINE-EPINEPHRINE (PF) 0.25% -1:200000 IJ SOLN
INTRAMUSCULAR | Status: AC
Start: 2022-02-24 — End: ?
  Filled 2022-02-24: qty 30

## 2022-02-24 MED ORDER — POVIDONE-IODINE 10 % EX SWAB: 2.0000 | Freq: Once | CUTANEOUS | Status: DC

## 2022-02-24 MED ORDER — SODIUM CHLORIDE 0.9% IV SOLUTION: Freq: Once | INTRAVENOUS | Status: DC

## 2022-02-24 MED ORDER — ROCURONIUM BROMIDE 10 MG/ML (PF) SYRINGE
PREFILLED_SYRINGE | INTRAVENOUS | Status: DC | PRN
  Administered 2022-02-24: 20 mg via INTRAVENOUS
  Administered 2022-02-24: 60 mg via INTRAVENOUS
  Administered 2022-02-24: 20 mg via INTRAVENOUS

## 2022-02-24 MED ORDER — PHENYLEPHRINE 80 MCG/ML (10ML) SYRINGE FOR IV PUSH (FOR BLOOD PRESSURE SUPPORT)
PREFILLED_SYRINGE | INTRAVENOUS | Status: DC | PRN
  Administered 2022-02-24: 160 ug via INTRAVENOUS

## 2022-02-24 MED ORDER — FENTANYL CITRATE (PF) 100 MCG/2ML IJ SOLN
INTRAMUSCULAR | Status: DC | PRN
  Administered 2022-02-24 (×4): 50 ug via INTRAVENOUS

## 2022-02-24 MED ORDER — PHENYLEPHRINE HCL-NACL 20-0.9 MG/250ML-% IV SOLN
INTRAVENOUS | Status: DC | PRN
  Administered 2022-02-24: 40 ug/min via INTRAVENOUS

## 2022-02-24 MED ORDER — ALBUMIN HUMAN 5 % IV SOLN
INTRAVENOUS | Status: AC
Start: 2022-02-24 — End: ?
  Filled 2022-02-24: qty 250

## 2022-02-24 MED ORDER — ALBUMIN HUMAN 5 % IV SOLN: INTRAVENOUS | Status: DC | PRN

## 2022-02-24 MED ORDER — CHLORHEXIDINE GLUCONATE 4 % EX LIQD: 60.0000 mL | Freq: Once | CUTANEOUS | Status: DC

## 2022-02-24 MED ORDER — LIDOCAINE HCL (PF) 2 % IJ SOLN
INTRAMUSCULAR | Status: AC
  Filled 2022-02-24: qty 5

## 2022-02-24 MED ORDER — SODIUM CHLORIDE 0.9 % IV SOLN: INTRAVENOUS | Status: DC | PRN

## 2022-02-24 MED ORDER — ALBUMIN HUMAN 5 % IV SOLN
INTRAVENOUS | Status: AC
  Filled 2022-02-24: qty 250

## 2022-02-24 MED ORDER — SUGAMMADEX SODIUM 200 MG/2ML IV SOLN
INTRAVENOUS | Status: DC | PRN
  Administered 2022-02-24: 200 mg via INTRAVENOUS

## 2022-02-24 MED ORDER — CEFAZOLIN SODIUM-DEXTROSE 2-4 GM/100ML-% IV SOLN
INTRAVENOUS | Status: AC
  Filled 2022-02-24: qty 100

## 2022-02-24 MED ORDER — VANCOMYCIN HCL 1000 MG IV SOLR
INTRAVENOUS | Status: AC
  Filled 2022-02-24: qty 20

## 2022-02-24 MED ORDER — SODIUM CHLORIDE (PF) 0.9 % IJ SOLN
INTRAMUSCULAR | Status: AC
  Filled 2022-02-24: qty 50

## 2022-02-24 MED ORDER — LIDOCAINE 2% (20 MG/ML) 5 ML SYRINGE
INTRAMUSCULAR | Status: DC | PRN
  Administered 2022-02-24: 100 mg via INTRAVENOUS

## 2022-02-24 MED ORDER — PROPOFOL 10 MG/ML IV BOLUS
INTRAVENOUS | Status: AC
  Filled 2022-02-24: qty 20

## 2022-02-24 MED ORDER — ACETAMINOPHEN 10 MG/ML IV SOLN
INTRAVENOUS | Status: DC | PRN
  Administered 2022-02-24: 1000 mg via INTRAVENOUS

## 2022-02-24 MED ORDER — PROPOFOL 10 MG/ML IV BOLUS
INTRAVENOUS | Status: DC | PRN
  Administered 2022-02-24: 110 mg via INTRAVENOUS
  Administered 2022-02-24: 30 mg via INTRAVENOUS

## 2022-02-24 MED ORDER — KETOROLAC TROMETHAMINE 30 MG/ML IJ SOLN
INTRAMUSCULAR | Status: AC
  Filled 2022-02-24: qty 1

## 2022-02-24 MED ORDER — 0.9 % SODIUM CHLORIDE (POUR BTL) OPTIME
TOPICAL | Status: DC | PRN
  Administered 2022-02-24: 1000 mL

## 2022-02-24 MED ORDER — ACETAMINOPHEN 10 MG/ML IV SOLN: 1000.0000 mg | Freq: Once | INTRAVENOUS | Status: DC | PRN

## 2022-02-24 MED ORDER — PHENYLEPHRINE HCL-NACL 20-0.9 MG/250ML-% IV SOLN
INTRAVENOUS | Status: AC
  Filled 2022-02-24: qty 250

## 2022-02-24 MED ORDER — ACETAMINOPHEN 500 MG PO TABS: 1000.0000 mg | ORAL_TABLET | Freq: Once | ORAL | Status: DC

## 2022-02-24 MED ORDER — METHOCARBAMOL 500 MG PO TABS
500.0000 mg | ORAL_TABLET | Freq: Four times a day (QID) | ORAL | Status: DC | PRN
  Administered 2022-02-25 (×2): 500 mg via ORAL
  Filled 2022-02-24 (×2): qty 1

## 2022-02-24 MED ORDER — OXYCODONE HCL 5 MG/5ML PO SOLN: 5.0000 mg | Freq: Once | ORAL | Status: DC | PRN

## 2022-02-24 MED ORDER — SODIUM CHLORIDE 0.9 % IV SOLN
2.0000 g | INTRAVENOUS | Status: DC
  Administered 2022-02-25 – 2022-02-26 (×2): 2 g via INTRAVENOUS
  Filled 2022-02-24 (×2): qty 20

## 2022-02-24 MED ORDER — ISOPROPYL ALCOHOL 70 % SOLN
Status: AC
Start: 2022-02-24 — End: ?
  Filled 2022-02-24: qty 480

## 2022-02-24 MED ORDER — ROCURONIUM BROMIDE 10 MG/ML (PF) SYRINGE
PREFILLED_SYRINGE | INTRAVENOUS | Status: AC
  Filled 2022-02-24: qty 10

## 2022-02-24 SURGICAL SUPPLY — 74 items
BAG COUNTER SPONGE SURGICOUNT (BAG) IMPLANT
BAG DECANTER FOR FLEXI CONT (MISCELLANEOUS) ×2 IMPLANT
BAG ZIPLOCK 12X15 (MISCELLANEOUS) ×2 IMPLANT
BLADE SAGITTAL 25.0X1.37X90 (BLADE) IMPLANT
BLADE SAW SGTL 18X1.27X75 (BLADE) IMPLANT
BNDG COHESIVE 6X5 TAN ST LF (GAUZE/BANDAGES/DRESSINGS) ×2 IMPLANT
BRUSH FEMORAL CANAL (MISCELLANEOUS) IMPLANT
CHLORAPREP W/TINT 26 (MISCELLANEOUS) ×2 IMPLANT
COVER SURGICAL LIGHT HANDLE (MISCELLANEOUS) ×2 IMPLANT
DERMABOND ADVANCED (GAUZE/BANDAGES/DRESSINGS) ×1
DERMABOND ADVANCED .7 DNX12 (GAUZE/BANDAGES/DRESSINGS) ×1 IMPLANT
DRAPE HIP W/POCKET STRL (MISCELLANEOUS) ×2 IMPLANT
DRAPE IMP U-DRAPE 54X76 (DRAPES) ×4 IMPLANT
DRAPE INCISE IOBAN 66X45 STRL (DRAPES) ×4 IMPLANT
DRAPE ORTHO SPLIT 77X108 STRL (DRAPES)
DRAPE POUCH INSTRU U-SHP 10X18 (DRAPES) ×2 IMPLANT
DRAPE SHEET LG 3/4 BI-LAMINATE (DRAPES) ×6 IMPLANT
DRAPE STERI IOBAN 125X83 (DRAPES) ×2 IMPLANT
DRAPE SURG 17X11 SM STRL (DRAPES) ×2 IMPLANT
DRAPE SURG ORHT 6 SPLT 77X108 (DRAPES) IMPLANT
DRAPE U-SHAPE 47X51 STRL (DRAPES) ×4 IMPLANT
DRSG AQUACEL AG ADV 3.5X10 (GAUZE/BANDAGES/DRESSINGS) ×2 IMPLANT
DRSG AQUACEL AG ADV 3.5X14 (GAUZE/BANDAGES/DRESSINGS) ×2 IMPLANT
ELECT BLADE TIP CTD 4 INCH (ELECTRODE) ×2 IMPLANT
ELECT REM PT RETURN 15FT ADLT (MISCELLANEOUS) ×2 IMPLANT
GAUZE SPONGE 2X2 8PLY STRL LF (GAUZE/BANDAGES/DRESSINGS) IMPLANT
GLOVE BIO SURGEON STRL SZ8.5 (GLOVE) ×4 IMPLANT
GLOVE BIOGEL M 7.0 STRL (GLOVE) ×2 IMPLANT
GLOVE BIOGEL PI IND STRL 7.5 (GLOVE) ×1 IMPLANT
GLOVE BIOGEL PI IND STRL 8 (GLOVE) ×1 IMPLANT
GLOVE BIOGEL PI IND STRL 8.5 (GLOVE) ×1 IMPLANT
GLOVE BIOGEL PI INDICATOR 7.5 (GLOVE) ×1
GLOVE BIOGEL PI INDICATOR 8 (GLOVE) ×1
GLOVE BIOGEL PI INDICATOR 8.5 (GLOVE) ×1
GLOVE SURG LX 7.5 STRW (GLOVE) ×2
GLOVE SURG LX STRL 7.5 STRW (GLOVE) ×2 IMPLANT
GOWN SPEC L3 XXLG W/TWL (GOWN DISPOSABLE) ×2 IMPLANT
GOWN STRL REUS W/ TWL LRG LVL3 (GOWN DISPOSABLE) ×1 IMPLANT
GOWN STRL REUS W/TWL LRG LVL3 (GOWN DISPOSABLE) ×1
HANDPIECE INTERPULSE COAX TIP (DISPOSABLE) ×1
HOOD PEEL AWAY FLYTE STAYCOOL (MISCELLANEOUS) ×8 IMPLANT
JET LAVAGE IRRISEPT WOUND (IRRIGATION / IRRIGATOR)
KIT BASIN OR (CUSTOM PROCEDURE TRAY) ×2 IMPLANT
KIT TURNOVER KIT A (KITS) IMPLANT
LAVAGE JET IRRISEPT WOUND (IRRIGATION / IRRIGATOR) IMPLANT
MANIFOLD NEPTUNE II (INSTRUMENTS) ×2 IMPLANT
NDL SAFETY ECLIPSE 18X1.5 (NEEDLE) ×1 IMPLANT
NEEDLE HYPO 18GX1.5 SHARP (NEEDLE) ×1
NEEDLE SPNL 18GX3.5 QUINCKE PK (NEEDLE) ×2 IMPLANT
NS IRRIG 1000ML POUR BTL (IV SOLUTION) ×4 IMPLANT
PACK TOTAL JOINT (CUSTOM PROCEDURE TRAY) ×2 IMPLANT
PRESSURIZER FEMORAL UNIV (MISCELLANEOUS) IMPLANT
PROTECTOR NERVE ULNAR (MISCELLANEOUS) ×2 IMPLANT
SEALER BIPOLAR AQUA 6.0 (INSTRUMENTS) ×2 IMPLANT
SET HNDPC FAN SPRY TIP SCT (DISPOSABLE) ×1 IMPLANT
SPONGE GAUZE 2X2 STER 10/PKG (GAUZE/BANDAGES/DRESSINGS)
STAPLER VISISTAT 35W (STAPLE) ×2 IMPLANT
STOCKINETTE 8 INCH (MISCELLANEOUS) ×2 IMPLANT
SUCTION FRAZIER HANDLE 12FR (TUBING) ×1
SUCTION TUBE FRAZIER 12FR DISP (TUBING) ×1 IMPLANT
SUT MNCRL AB 3-0 PS2 18 (SUTURE) IMPLANT
SUT MON AB 2-0 CT1 36 (SUTURE) ×4 IMPLANT
SUT STRATAFIX PDO 1 14 VIOLET (SUTURE) ×2
SUT STRATFX PDO 1 14 VIOLET (SUTURE) ×2
SUT VIC AB 1 CT1 36 (SUTURE) ×4 IMPLANT
SUT VIC AB 1 CTX 36 (SUTURE) ×1
SUT VIC AB 1 CTX36XBRD ANBCTR (SUTURE) ×1 IMPLANT
SUT VIC AB 2-0 CT1 27 (SUTURE) ×1
SUT VIC AB 2-0 CT1 TAPERPNT 27 (SUTURE) ×1 IMPLANT
SUTURE STRATFX PDO 1 14 VIOLET (SUTURE) ×2 IMPLANT
TOWEL OR 17X26 10 PK STRL BLUE (TOWEL DISPOSABLE) ×4 IMPLANT
TOWER CARTRIDGE SMART MIX (DISPOSABLE) IMPLANT
TRAY FOLEY MTR SLVR 16FR STAT (SET/KITS/TRAYS/PACK) ×2 IMPLANT
WATER STERILE IRR 1000ML POUR (IV SOLUTION) ×2 IMPLANT

## 2022-02-24 SURGICAL SUPPLY — 94 items
BAG COUNTER SPONGE SURGICOUNT (BAG) IMPLANT
BAG DECANTER FOR FLEXI CONT (MISCELLANEOUS) ×3 IMPLANT
BAG SPEC THK2 15X12 ZIP CLS (MISCELLANEOUS) ×1
BAG ZIPLOCK 12X15 (MISCELLANEOUS) ×3 IMPLANT
BLADE SAGITTAL 25.0X1.37X90 (BLADE) IMPLANT
BLADE SAW SGTL 18X1.27X75 (BLADE) IMPLANT
BNDG COHESIVE 6X5 TAN ST LF (GAUZE/BANDAGES/DRESSINGS) ×5 IMPLANT
BRUSH FEMORAL CANAL (MISCELLANEOUS) IMPLANT
CABLE CERLAGE W/CRIMP 1.8 (Cable) ×14 IMPLANT
CABLE READY CERCLAGE W/CRIP (Trauma Fixation) ×7 IMPLANT
CHLORAPREP W/TINT 26 (MISCELLANEOUS) ×5 IMPLANT
COVER MAYO STAND XLG (MISCELLANEOUS) ×2 IMPLANT
COVER SURGICAL LIGHT HANDLE (MISCELLANEOUS) ×3 IMPLANT
DERMABOND ADVANCED (GAUZE/BANDAGES/DRESSINGS) ×2
DERMABOND ADVANCED .7 DNX12 (GAUZE/BANDAGES/DRESSINGS) ×3 IMPLANT
DRAPE HIP W/POCKET STRL (MISCELLANEOUS) ×3 IMPLANT
DRAPE IMP U-DRAPE 54X76 (DRAPES) ×6 IMPLANT
DRAPE INCISE IOBAN 66X45 STRL (DRAPES) ×6 IMPLANT
DRAPE INCISE IOBAN 85X60 (DRAPES) ×2 IMPLANT
DRAPE ORTHO SPLIT 77X108 STRL (DRAPES)
DRAPE POUCH INSTRU U-SHP 10X18 (DRAPES) ×3 IMPLANT
DRAPE SHEET LG 3/4 BI-LAMINATE (DRAPES) ×11 IMPLANT
DRAPE STERI IOBAN 125X83 (DRAPES) ×3 IMPLANT
DRAPE SURG 17X11 SM STRL (DRAPES) ×3 IMPLANT
DRAPE SURG ORHT 6 SPLT 77X108 (DRAPES) IMPLANT
DRAPE U-SHAPE 47X51 STRL (DRAPES) ×6 IMPLANT
DRESSING AQUACEL AG SP 3.5X10 (GAUZE/BANDAGES/DRESSINGS) ×1 IMPLANT
DRSG AQUACEL AG ADV 3.5X10 (GAUZE/BANDAGES/DRESSINGS) ×3 IMPLANT
DRSG AQUACEL AG ADV 3.5X14 (GAUZE/BANDAGES/DRESSINGS) ×3 IMPLANT
DRSG AQUACEL AG SP 3.5X10 (GAUZE/BANDAGES/DRESSINGS) ×3
ELECT BLADE TIP CTD 4 INCH (ELECTRODE) ×3 IMPLANT
ELECT REM PT RETURN 15FT ADLT (MISCELLANEOUS) ×3 IMPLANT
FACESHIELD WRAPAROUND (MASK) ×6 IMPLANT
FACESHIELD WRAPAROUND OR TEAM (MASK) IMPLANT
GAUZE SPONGE 2X2 8PLY STRL LF (GAUZE/BANDAGES/DRESSINGS) IMPLANT
GAUZE SPONGE 4X4 12PLY STRL (GAUZE/BANDAGES/DRESSINGS) ×6 IMPLANT
GLOVE BIO SURGEON STRL SZ8 (GLOVE) ×2 IMPLANT
GLOVE BIO SURGEON STRL SZ8.5 (GLOVE) ×6 IMPLANT
GLOVE BIOGEL M 7.0 STRL (GLOVE) ×3 IMPLANT
GLOVE BIOGEL PI IND STRL 7.5 (GLOVE) ×2 IMPLANT
GLOVE BIOGEL PI IND STRL 8 (GLOVE) ×3 IMPLANT
GLOVE BIOGEL PI IND STRL 8.5 (GLOVE) ×2 IMPLANT
GLOVE BIOGEL PI INDICATOR 7.5 (GLOVE) ×1
GLOVE BIOGEL PI INDICATOR 8 (GLOVE) ×2
GLOVE BIOGEL PI INDICATOR 8.5 (GLOVE) ×1
GLOVE SURG LX 7.5 STRW (GLOVE) ×2
GLOVE SURG LX STRL 7.5 STRW (GLOVE) ×4 IMPLANT
GOWN SPEC L3 XXLG W/TWL (GOWN DISPOSABLE) ×3 IMPLANT
GOWN STRL REUS W/ TWL LRG LVL3 (GOWN DISPOSABLE) ×2 IMPLANT
GOWN STRL REUS W/ TWL XL LVL3 (GOWN DISPOSABLE) ×2 IMPLANT
GOWN STRL REUS W/TWL LRG LVL3 (GOWN DISPOSABLE) ×3
GOWN STRL REUS W/TWL XL LVL3 (GOWN DISPOSABLE) ×6
HANDPIECE INTERPULSE COAX TIP (DISPOSABLE) ×3
HEAD CERAMIC BIOLOX 36 (Head) ×2 IMPLANT
HOOD PEEL AWAY FLYTE STAYCOOL (MISCELLANEOUS) ×8 IMPLANT
IMPL ARCOS CONE SZ A 50 HIP (Miscellaneous) ×1 IMPLANT
IMPLANT ARCOS CONE SZ A 50 HIP (Miscellaneous) ×3 IMPLANT
JET LAVAGE IRRISEPT WOUND (IRRIGATION / IRRIGATOR) ×3
KIT BASIN OR (CUSTOM PROCEDURE TRAY) ×3 IMPLANT
KIT TURNOVER KIT A (KITS) IMPLANT
LAVAGE JET IRRISEPT WOUND (IRRIGATION / IRRIGATOR) ×1 IMPLANT
MANIFOLD NEPTUNE II (INSTRUMENTS) ×3 IMPLANT
NDL SAFETY ECLIPSE 18X1.5 (NEEDLE) ×2 IMPLANT
NDL SPNL 18GX3.5 QUINCKE PK (NEEDLE) ×1 IMPLANT
NEEDLE HYPO 18GX1.5 SHARP (NEEDLE) ×3
NEEDLE SPNL 18GX3.5 QUINCKE PK (NEEDLE) ×3 IMPLANT
PACK TOTAL JOINT (CUSTOM PROCEDURE TRAY) ×3 IMPLANT
PRESSURIZER FEMORAL UNIV (MISCELLANEOUS) IMPLANT
PROTECTOR NERVE ULNAR (MISCELLANEOUS) ×3 IMPLANT
REMOVER STAPLE SKIN (DISPOSABLE) ×2 IMPLANT
SEALER BIPOLAR AQUA 6.0 (INSTRUMENTS) ×3 IMPLANT
SET HNDPC FAN SPRY TIP SCT (DISPOSABLE) ×2 IMPLANT
SPONGE GAUZE 2X2 STER 10/PKG (GAUZE/BANDAGES/DRESSINGS)
STAPLER VISISTAT 35W (STAPLE) ×5 IMPLANT
STEM FEM CMTLS REV HIP 16X250 (Stem) ×2 IMPLANT
STOCKINETTE 8 INCH (MISCELLANEOUS) ×3 IMPLANT
SUCTION FRAZIER HANDLE 12FR (TUBING) ×3
SUCTION TUBE FRAZIER 12FR DISP (TUBING) ×2 IMPLANT
SUT MNCRL AB 3-0 PS2 18 (SUTURE) IMPLANT
SUT MON AB 2-0 CT1 36 (SUTURE) ×10 IMPLANT
SUT STRATAFIX PDO 1 14 VIOLET (SUTURE) ×6
SUT STRATFX PDO 1 14 VIOLET (SUTURE) ×4
SUT VIC AB 0 CTX 27 (SUTURE) ×4 IMPLANT
SUT VIC AB 1 CT1 36 (SUTURE) ×6 IMPLANT
SUT VIC AB 1 CTX 36 (SUTURE) ×3
SUT VIC AB 1 CTX36XBRD ANBCTR (SUTURE) ×2 IMPLANT
SUT VIC AB 2-0 CT1 27 (SUTURE) ×3
SUT VIC AB 2-0 CT1 TAPERPNT 27 (SUTURE) ×2 IMPLANT
SUTURE STRATFX PDO 1 14 VIOLET (SUTURE) ×4 IMPLANT
SYR 50ML SLIP (SYRINGE) ×2 IMPLANT
TOWEL OR 17X26 10 PK STRL BLUE (TOWEL DISPOSABLE) ×6 IMPLANT
TOWER CARTRIDGE SMART MIX (DISPOSABLE) IMPLANT
TRAY FOLEY MTR SLVR 16FR STAT (SET/KITS/TRAYS/PACK) ×3 IMPLANT
WATER STERILE IRR 1000ML POUR (IV SOLUTION) ×3 IMPLANT

## 2022-02-24 NOTE — Transfer of Care (Signed)
Immediate Anesthesia Transfer of Care Note  Patient: Rebecca Barnes  Procedure(s) Performed: FEMUR COMPONENT REVISION (Right: Hip)  Patient Location: PACU  Anesthesia Type:General  Level of Consciousness: drowsy and patient cooperative  Airway & Oxygen Therapy: Patient Spontanous Breathing and Patient connected to face mask oxygen  Post-op Assessment: Report given to RN and Post -op Vital signs reviewed and stable  Post vital signs: Reviewed and stable  Last Vitals:  Vitals Value Taken Time  BP 158/70 02/24/22 2330  Temp    Pulse 94 02/24/22 2334  Resp 14 02/24/22 2334  SpO2 100 % 02/24/22 2334  Vitals shown include unvalidated device data.  Last Pain:  Vitals:   02/24/22 1700  TempSrc: Oral  PainSc:       Patients Stated Pain Goal: 3 (02/24/22 1300)  Complications: No notable events documented.

## 2022-02-24 NOTE — Plan of Care (Signed)
  Problem: Education: Goal: Knowledge of General Education information will improve Description: Including pain rating scale, medication(s)/side effects and non-pharmacologic comfort measures Outcome: Progressing   Problem: Pain Managment: Goal: General experience of comfort will improve Outcome: Progressing   Problem: Safety: Goal: Ability to remain free from injury will improve Outcome: Progressing   

## 2022-02-24 NOTE — Anesthesia Procedure Notes (Signed)
Procedure Name: Intubation Date/Time: 02/24/2022 6:31 PM  Performed by: Montel Clock, CRNAPre-anesthesia Checklist: Patient identified, Emergency Drugs available, Suction available, Patient being monitored and Timeout performed Patient Re-evaluated:Patient Re-evaluated prior to induction Oxygen Delivery Method: Circle system utilized Preoxygenation: Pre-oxygenation with 100% oxygen Induction Type: IV induction Ventilation: Mask ventilation without difficulty Laryngoscope Size: Mac and 3 Grade View: Grade II Tube type: Oral Tube size: 7.0 mm Number of attempts: 1 Airway Equipment and Method: Stylet Placement Confirmation: ETT inserted through vocal cords under direct vision, positive ETCO2 and breath sounds checked- equal and bilateral Secured at: 21 cm Tube secured with: Tape Dental Injury: Teeth and Oropharynx as per pre-operative assessment  Comments: 2b view with downward laryngeal pressure

## 2022-02-24 NOTE — Op Note (Signed)
OPERATIVE REPORT   02/24/2022  10:25 PM  PATIENT:  Rebecca Barnes   SURGEON:  Jonette Pesa, MD  ASSISTANT:  Clint Bolder, PA-C.   PREOPERATIVE DIAGNOSIS:  Right [place] B2 periprosthetic femur fracture.  POSTOPERATIVE DIAGNOSIS:  Same.  PROCEDURE:  Revision femoral component right total hip arthroplasty. Interpretation of biplanar fluoroscopic images.  ANESTHESIA:   GETA.  ANTIBIOTICS:  2 g Ancef.  EXPLANTS: Biomet Taperloc Reduced Distal stem size 14 x113mm high offset. Biolox ceramic head ball 36 + 0 mm.  IMPLANTS: Biomet Arcos modular bowed STS distal stem, size 16 x 250 mm. Arcos modular high offset proximal cone body, size A, 50 mm. Biomet Biolox ceramic head ball, size 36-3 mm. Zimmer 1.8 mm adult reconstruction cable x7.  ESTIMATED BLOOD LOSS: 1200 cc.  SPECIMENS: None.  TUBES AND DRAINS: None.  BLOOD PRODUCTS: 2 units packed red blood cells.  COMPLICATIONS: None.  DISPOSITION: Stable to PACU.  SURGICAL INDICATIONS:  Rebecca Barnes is a 76 y.o. female with multiple medical problems including advanced dementia who underwent primary right total hip arthroplasty for displaced femoral neck fracture on 02/02/2022.  She developed a GI bleed after surgery and missed her postop appointment.  She had an unwitnessed fall at her facility and was brought to the emergency department due to deformity of the femur.  X-rays and CT scan of the right femur showed a Vancouver B2 periprosthetic femur fracture.  She was indicated for revision femoral component.  I discussed the risk, benefits, and alternatives with her daughter, and she elected to proceed.  The risks, benefits, and alternatives were discussed with the patient preoperatively including but not limited to the risks of infection, bleeding, nerve / blood vessel injury, malunion, nonunion, cardiopulmonary complications, the need for repeat surgery, among others, and the patient was willing to proceed.  PROCEDURE IN  DETAIL: The patient was identified in the holding area using 2 identifiers.  The surgical site was marked by myself.  She was taken to the operating room, placed supine on the operating room table.  General anesthesia was induced.  I examined her right hip.  Her anterior hip incision was intact with staples in place.  She had significant hyperemia around the staple insertion sites with hypergranulation tissue present.  I removed the staples.  No fluctuance, purulence, or active drainage was present.  Steri-Strips were applied.  The patient was then flipped to the left lateral decubitus position.  She was secured to the table with a beanbag.  All bony prominences were well-padded.  Axillary roll was placed.  The right hip was prepped and draped in the normal sterile surgical fashion.  Timeout was called, verifying site and site of surgery.  She did receive IV antibiotics within 60 minutes of beginning the procedure.  Longitudinal incision was created over the proximal femur for a posterior approach.  Full-thickness skin flaps were created.  The IT band was split in line with fibers.  The sciatic nerve was palpated and protected throughout the case.  The vastus lateralis was identified and dissected off the posterior intermuscular septum.  The fracture was identified.  There was a very large fracture hematoma which was irrigated and suctioned.  There was a lateral butterfly fragment.  The distal fracture fragment in the proximal fracture fragment were aligned using traction and rotation.  The butterfly fragment was then keyed into place.  Reduction was held with lobster claws.  A total of 5 subperiosteal adult reconstruction cables were used to hold the fracture  in place.  I then placed a prophylactic adult reconstruction cable periosteally about 2 cm distal to the fracture.  I removed the previous prophylactic cable placed at the initial surgery, and this was replaced with a cable that was placed slightly more  distally on the lateral aspect of the femur.  AP and lateral fluoroscopy views were then used to confirm anatomic reduction of the fracture.  A Charnley retractor was placed and the short external rotators and posterior capsule were taken down in a single layer.  The hip was gently dislocated with a bone hook.  The lateral shoulder was cleared.  The stem was easily removed with a loop extractor.  I then placed 190 mm length reamer, and the position was checked on AP and lateral fluoroscopy views.  As the fracture was not bypassed by 4 cortices, I then removed the reamer and placed a 250 mm length reamer.  AP and lateral fluoroscopy views were used to confirm that the fracture was appropriately bypassed.  I then reamed up to 16 mm reamer with excellent chatter.  The real 16 x 250 mm stem was opened and impacted into place.  I then reamed proximally for a size a bone body.  The size a trial and a 36-3 head were placed.  The hip was reduced.  Stability testing was performed and found to be excellent.  The hip was then dislocated and I removed the trial head and body.  The real body was placed and the bolt was torqued according to [employer] specifications.  The real 36-3 mm ceramic head ball was impacted onto the trunnion, and the hip was reduced.  At 90 degrees of flexion and neutral abduction, I was able to internally rotate her to 90 degrees without any impingement or dislocation.  She was stable in potty position.  In full extension and external rotation, there was no instability.  Leg lengths were palpated and felt to be adequate.  All of the cables were then finally tension, locked, and clipped.    The wound was irrigated with normal saline using pulse lavage and Prontosan solution.  1 g of vancomycin powder was placed along the periprosthetic fracture site.  The IT band was closed with #1 Vicryl.  The deep fatty tissue was closed with 0 Vicryl suture.  The deep dermal layer was closed with 2-0 Monocryl.   The skin was reapproximated with staples.  Dermabond was applied.  Once the glue was fully hardened, an Aquacel dressing was placed.  The patient was then flipped supine and extubated.  She was taken to the PACU in stable condition.  She did receive 2 units of packed red blood cells intraoperatively.  Sponge, needle, and instrument counts were correct at the end of the case x2.  There were no known complications.  POSTOPERATIVE PLAN: Postoperatively, she will be readmitted to the hospitalist service.  Plavix for DVT prophylaxis.  We will hold other prophylactic agents due to presence of GI bleed.  50% weightbearing right lower extremity with a walker.  Posterior hip precautions.  Due to irritation of the previous incision, we will continue IV ceftriaxone while she is in house.  Mobilize out of bed with PT/OT.  She will undergo disposition planning.  Follow-up in the office for routine 2-week postoperative care.

## 2022-02-24 NOTE — Anesthesia Preprocedure Evaluation (Signed)
Anesthesia Evaluation  Patient identified by MRN, date of birth, ID band Patient awake and Patient confused    Reviewed: Allergy & Precautions, NPO status , Patient's Chart, lab work & pertinent test results  History of Anesthesia Complications Negative for: history of anesthetic complications  Airway Mallampati: III  TM Distance: >3 FB Neck ROM: Full    Dental  (+) Chipped   Pulmonary asthma , Patient abstained from smoking.,    Pulmonary exam normal        Cardiovascular hypertension, Pt. on medications + Peripheral Vascular Disease (on Plavix)  Normal cardiovascular exam     Neuro/Psych Anxiety Depression CVA    GI/Hepatic negative GI ROS, Neg liver ROS,   Endo/Other  diabetes, Type 2, Oral Hypoglycemic Agents  Renal/GU negative Renal ROS  negative genitourinary   Musculoskeletal right femoral peri-prosthetic fx   Abdominal   Peds  Hematology negative hematology ROS (+)   Anesthesia Other Findings Day of surgery medications reviewed with patient.  Reproductive/Obstetrics negative OB ROS                             Anesthesia Physical Anesthesia Plan  ASA: 3  Anesthesia Plan: General   Post-op Pain Management: Ofirmev IV (intra-op)*   Induction: Intravenous  PONV Risk Score and Plan: 3 and Treatment may vary due to age or medical condition, Dexamethasone and Ondansetron  Airway Management Planned: Oral ETT  Additional Equipment: None  Intra-op Plan:   Post-operative Plan: Extubation in OR  Informed Consent: I have reviewed the patients History and Physical, chart, labs and discussed the procedure including the risks, benefits and alternatives for the proposed anesthesia with the patient or authorized representative who has indicated his/her understanding and acceptance.   Patient has DNR.  Discussed DNR with patient and Continue DNR.   Dental advisory given and Consent  reviewed with POA  Plan Discussed with: CRNA  Anesthesia Plan Comments: (Consent reviewed with patient's daughter, [name], via telephone. DNR status discussed and daughter prefers patient to remain DNR perioperatively. Stephannie Peters, MD)        Anesthesia Quick Evaluation

## 2022-02-24 NOTE — Discharge Instructions (Signed)
Dr. Samson Frederic Joint Replacement Specialist [facility] [address] [place], [place] [address] [phone]   TOTAL HIP REPLACEMENT POSTOPERATIVE DIRECTIONS    Hip Rehabilitation, Guidelines Following Surgery   WEIGHT BEARING Partial weight bearing with assist device as directed.  50% WB RLE with walker  The results of a hip operation are greatly improved after range of motion and muscle strengthening exercises. Follow all safety measures which are given to protect your hip. If any of these exercises cause increased pain or swelling in your joint, decrease the amount until you are comfortable again. Then slowly increase the exercises. Call your caregiver if you have problems or questions.   HOME CARE INSTRUCTIONS  Most of the following instructions are designed to prevent the dislocation of your new hip.  Remove items at home which could result in a fall. This includes throw rugs or furniture in walking pathways.  Continue medications as instructed at time of discharge. You may have some home medications which will be placed on hold until you complete the course of blood thinner medication. You may start showering once you are discharged home. Do not remove your dressing. Do not put on socks or shoes without following the instructions of your caregivers.   Sit on chairs with arms. Use the chair arms to help push yourself up when arising.  Arrange for the use of a toilet seat elevator so you are not sitting low.  Walk with walker as instructed.  You may resume a sexual relationship in one month or when given the OK by your caregiver.  Use walker as long as suggested by your caregivers.  You may put full weight on your legs and walk as much as is comfortable. Avoid periods of inactivity such as sitting longer than an hour when not asleep. This helps prevent blood clots.  You may return to work once you are cleared by Designer, industrial/product.  Do not drive a car  for 6 weeks or until released by your surgeon.  Do not drive while taking narcotics.  Wear elastic stockings for two weeks following surgery during the day but you may remove then at night.  Make sure you keep all of your appointments after your operation with all of your doctors and caregivers. You should call the office at the above phone number and make an appointment for approximately two weeks after the date of your surgery. Please pick up a stool softener and laxative for home use as long as you are requiring pain medications. ICE to the affected hip every three hours for 30 minutes at a time and then as needed for pain and swelling. Continue to use ice on the hip for pain and swelling from surgery. You may notice swelling that will progress down to the foot and ankle.  This is normal after surgery.  Elevate the leg when you are not up walking on it.   It is important for you to complete the blood thinner medication as prescribed by your doctor. Continue to use the breathing machine which will help keep your temperature down.  It is common for your temperature to cycle up and down following surgery, especially at night when you are not up moving around and exerting yourself.  The breathing machine keeps your lungs expanded and your temperature down.  RANGE OF MOTION AND STRENGTHENING EXERCISES  These exercises are designed to help you keep full movement of your hip joint. Follow your caregiver's or physical therapist's instructions.  Perform all exercises about fifteen times, three times per day or as directed. Exercise both hips, even if you have had only one joint replacement. These exercises can be done on a training (exercise) mat, on the floor, on a table or on a bed. Use whatever works the best and is most comfortable for you. Use music or television while you are exercising so that the exercises are a pleasant break in your day. This will make your life better with the exercises acting as a  break in routine you can look forward to.  Lying on your back, slowly slide your foot toward your buttocks, raising your knee up off the floor. Then slowly slide your foot back down until your leg is straight again.  Lying on your back spread your legs as far apart as you can without causing discomfort.  Lying on your side, raise your upper leg and foot straight up from the floor as far as is comfortable. Slowly lower the leg and repeat.  Lying on your back, tighten up the muscle in the front of your thigh (quadriceps muscles). You can do this by keeping your leg straight and trying to raise your heel off the floor. This helps strengthen the largest muscle supporting your knee.  Lying on your back, tighten up the muscles of your buttocks both with the legs straight and with the knee bent at a comfortable angle while keeping your heel on the floor.   SKILLED REHAB INSTRUCTIONS: If the patient is transferred to a skilled rehab facility following release from the hospital, a list of the current medications will be sent to the facility for the patient to continue.  When discharged from the skilled rehab facility, please have the facility set up the patient's Home Health Physical Therapy prior to being released. Also, the skilled facility will be responsible for providing the patient with their medications at time of release from the facility to include their pain medication and their blood thinner medication. If the patient is still at the rehab facility at time of the two week follow up appointment, the skilled rehab facility will also need to assist the patient in arranging follow up appointment in our office and any transportation needs.  POST-OPERATIVE OPIOID TAPER INSTRUCTIONS: It is important to wean off of your opioid medication as soon as possible. If you do not need pain medication after your surgery it is ok to stop day one. Opioids include: Codeine, Hydrocodone(Norco, Vicodin),  Oxycodone(Percocet, oxycontin) and hydromorphone amongst others.  Long term and even short term use of opiods can cause: Increased pain response Dependence Constipation Depression Respiratory depression And more.  Withdrawal symptoms can include Flu like symptoms Nausea, vomiting And more Techniques to manage these symptoms Hydrate well Eat regular healthy meals Stay active Use relaxation techniques(deep breathing, meditating, yoga) Do Not substitute Alcohol to help with tapering If you have been on opioids for less than two weeks and do not have pain than it is ok to stop all together.  Plan to wean off of opioids This plan should start within one week post op of your joint replacement. Maintain the same interval or time between taking each dose and first decrease the dose.  Cut the total daily intake of opioids by one tablet each day Next start to increase the time between doses. The last dose that should be eliminated is the evening dose.    MAKE SURE YOU:  Understand these instructions.  Will watch your condition.  Will get  help right away if you are not doing well or get worse.  Pick up stool softner and laxative for home use following surgery while on pain medications. Do not remove your dressing. The dressing is waterproof--it is OK to take showers. Continue to use ice for pain and swelling after surgery. Do not use any lotions or creams on the incision until instructed by your surgeon. Total Hip Protocol.

## 2022-02-24 NOTE — Interval H&P Note (Signed)
History and Physical Interval Note:  02/24/2022 5:58 PM  Rebecca Barnes  has presented today for surgery, with the diagnosis of right femoral peri-prosthetic fx.  The various methods of treatment have been discussed with the patient and family. After consideration of risks, benefits and other options for treatment, the patient has consented to  Procedure(s): FEMUR COMPONENT REVISION (Right) as a surgical intervention.  The patient's history has been reviewed, patient examined, no change in status, stable for surgery.  I have reviewed the patient's chart and labs.  Questions were answered to the patient's satisfaction.   This is a very unfortunate situation. Pt fell out of bed and now has [place] B2 ppx femur fracture, which requires femoral component revision. Due to medical problems and dementia, she is high risk for further complications.  The risks, benefits, and alternatives were discussed with the patient. There are risks associated with the surgery including, but not limited to, problems with anesthesia (death), infection, differences in leg length/angulation/rotation, fracture of bones, loosening or failure of implants, malunion, nonunion, hematoma (blood accumulation) which may require surgical drainage, blood clots, pulmonary embolism, nerve injury (foot drop), and blood vessel injury. The patient understands these risks and elects to proceed.    Iline Oven Jordyn Hofacker

## 2022-02-24 NOTE — Plan of Care (Signed)
  Problem: Education: Goal: Ability to describe self-care measures that may prevent or decrease complications (Diabetes Survival Skills Education) will improve Outcome: Progressing   Problem: Fluid Volume: Goal: Ability to maintain a balanced intake and output will improve Outcome: Progressing   Problem: Nutritional: Goal: Maintenance of adequate nutrition will improve Outcome: Progressing   

## 2022-02-24 NOTE — Progress Notes (Signed)
PROGRESS NOTE  Rebecca Barnes POE:[id number] DOB: 1946-08-17   PCP: Caesar Bookman, NP  Patient is from: SNF  DOA: 02/22/2022 LOS: 1  Chief complaints Chief Complaint  Patient presents with   Fall     Brief Narrative / Interim history: 77 year old F with PMH of CVA with residual aphasia, DM-2, HTN, anemia, GIB and recent right THA on 02/02/2022 returning from SNF with right hip pain after accidental fall out of her bed.  She was admitted for periprosthetic hip fracture, ABLA in the setting of right hip postsurgical hematoma and agitation.  Orthopedic surgery consulted and planning ORIF.  Subjective: Seen and examined earlier this morning.  No major events overnight of this morning.  Difficult to understand her speech due to aphasia.  She follows command.  Does not appear to be in distress.  Objective: Vitals:   02/24/22 1100 02/24/22 1145 02/24/22 1404 02/24/22 1501  BP: (!) 154/70 (!) 148/68 (!) 161/77 (!) 130/54  Pulse: (!) 101 100 (!) 110 (!) 108  Resp: 18 18 20 17   Temp: 98.2 F (36.8 C) 98.4 F (36.9 C) 98.2 F (36.8 C) 98.6 F (37 C)  TempSrc: Oral Oral Oral Oral  SpO2: 94% 94% 96% 95%  Weight:      Height:        Examination:  GENERAL: No apparent distress.  Nontoxic. HEENT: MMM.  Vision and hearing grossly intact.  NECK: Supple.  No apparent JVD.  RESP:  No IWOB.  Fair aeration bilaterally. CVS:  RRR. Heart sounds normal.  ABD/GI/GU: BS+. Abd soft, NTND.  MSK/EXT:  Moves extremities. No apparent deformity. No edema.  SKIN: no apparent skin lesion or wound NEURO: Awake, alert and oriented appropriately.  No apparent focal neuro deficit. PSYCH: Calm. Normal affect.   Procedures:  None  Microbiology summarized: None  Assessment and plan: Principal Problem:   Periprosthetic hip fracture Active Problems:   Fall at nursing home   Anxiety   Type 2 diabetes mellitus without complication, without long-term current use of insulin (HCC)   Leukocytosis    Essential hypertension   Hypokalemia   HLD (hyperlipidemia)   Normocytic anemia  Fall at nursing home Periprosthetic hip fracture Recent right total hip arthroplasty -Plan for ORIF later today. -Pain control   Acute postoperative blood loss anemia due to surgical site hematoma-patient is on DAPT for CVA. Iron deficiency anemia/vitamin B12 deficiency Recent Labs    02/14/22 1042 02/14/22 1603 02/14/22 2032 02/15/22 0519 02/22/22 2337 02/22/22 2347 02/23/22 0445 02/23/22 1100 02/23/22 2316 02/24/22 0917  HGB 8.1* 8.0* 7.2* 8.0* 7.4* 7.8* 7.1* 6.9* 8.7* 9.3*  -Baseline Hgb about 13. -CT showed right hip fluid collection measuring 10.5 x 5.4 cm on 6/25>>> 16 x 6 x 12 cm on 7/4 -Transfused 2 units with appropriate response. -Received IV ferric gluconate and B12 injection on 7/4. -Continue holding DAPT -Monitor H&H  History of CVA with residual aphasia: On DAPT prior to hospitalization. -Holding DAPT in the setting of hematoma  Controlled NIDDM-2 with hyperglycemia: A1c 6.3%.  On metformin at home. Recent Labs  Lab 02/23/22 1127 02/23/22 1613 02/23/22 2303 02/24/22 0750 02/24/22 1143  GLUCAP 126* 135* 146* 129* 131*  -Continue SSI-very sensitive  Essential hypertension: Normotensive. -Continue holding home amlodipine   Agitation/mood disorder: Seems to have resolved.  She might have some underlying cognitive impairment -Continue scheduled Klonopin, Seroquel as needed.  Along with Haldol as needed. -Continue home Prozac.  Obesity Body mass index is 35.55 kg/m.  DVT prophylaxis:  SCDs Start: 02/23/22 0310  Code Status: DNR/DNI Family Communication: None at bedside Level of care: Telemetry Status is: Inpatient Remains inpatient appropriate because: Periprosthetic right hip fracture   Final disposition: TBD Consultants:  Orthopedic surgery  Sch Meds:  Scheduled Meds:  sodium chloride   Intravenous Once   acetaminophen  1,000 mg Oral Once    chlorhexidine  60 mL Topical Once   clonazePAM  0.5 mg Oral BID   docusate sodium  100 mg Oral BID   fenofibrate  54 mg Oral q morning   FLUoxetine  40 mg Oral Daily   insulin aspart  0-6 Units Subcutaneous Q4H   povidone-iodine  2 Application Topical Once   vitamin B-12  1,000 mcg Oral Daily   Continuous Infusions:  sodium chloride 75 mL/hr at 02/23/22 2156    ceFAZolin (ANCEF) IV     dextrose 5 % and 0.45 % NaCl with KCl 20 mEq/L 50 mL/hr at 02/24/22 0748   methocarbamol (ROBAXIN) IV     tranexamic acid     PRN Meds:.acetaminophen **OR** acetaminophen, haloperidol lactate, HYDROmorphone (DILAUDID) injection, hydrOXYzine, melatonin, methocarbamol (ROBAXIN) IV **OR** methocarbamol, naLOXone (NARCAN)  injection, ondansetron (ZOFRAN) IV, oxyCODONE  Antimicrobials: Anti-infectives (From admission, onward)    Start     Dose/Rate Route Frequency Ordered Stop   02/24/22 1115  ceFAZolin (ANCEF) IVPB 2g/100 mL premix        2 g 200 mL/hr over 30 Minutes Intravenous On call to O.R. 02/24/22 1021 02/25/22 0559   02/24/22 0600  ceFAZolin (ANCEF) IVPB 2g/100 mL premix  Status:  Discontinued        2 g 200 mL/hr over 30 Minutes Intravenous On call to O.R. 02/23/22 1714 02/24/22 1234        I have personally reviewed the following labs and images: CBC: Recent Labs  Lab 02/22/22 2337 02/22/22 2347 02/23/22 0445 02/23/22 1100 02/23/22 2316 02/24/22 0917  WBC 12.9*  --  9.3 7.9  --  10.4  NEUTROABS  --   --  6.5  --   --   --   HGB 7.4* 7.8* 7.1* 6.9* 8.7* 9.3*  HCT 23.8* 23.0* 23.5* 22.3* 27.6* 29.7*  MCV 83.2  --  82.2 81.4  --  82.7  PLT 554*  --  591* 574*  --  544*   BMP &GFR Recent Labs  Lab 02/22/22 2337 02/22/22 2347 02/23/22 0445 02/24/22 0917  NA 138 138 141 138  K 3.4* 3.4* 3.8 4.2  CL 107 106 104 107  CO2 22  --  25 24  GLUCOSE 164* 164* 150* 147*  BUN 13 11 12  7*  CREATININE 0.64 0.40* 0.66 0.53  CALCIUM 8.8*  --  9.2 8.8*  MG  --   --  1.7 1.7  PHOS  --    --   --  2.8   Estimated Creatinine Clearance: 70 mL/min (by C-G formula based on SCr of 0.53 mg/dL). Liver & Pancreas: Recent Labs  Lab 02/22/22 2337 02/23/22 0445 02/24/22 0917  AST 18 17  --   ALT 11 11  --   ALKPHOS 89 91  --   BILITOT 0.4 0.5  --   PROT 5.9* 6.2*  --   ALBUMIN 2.7* 2.7* 3.0*   No results for input(s): "LIPASE", "AMYLASE" in the last 168 hours. No results for input(s): "AMMONIA" in the last 168 hours. Diabetic: No results for input(s): "HGBA1C" in the last 72 hours. Recent Labs  Lab 02/23/22  1127 02/23/22 1613 02/23/22 2303 02/24/22 0750 02/24/22 1143  GLUCAP 126* 135* 146* 129* 131*   Cardiac Enzymes: Recent Labs  Lab 02/23/22 1100  CKTOTAL 127   No results for input(s): "PROBNP" in the last 8760 hours. Coagulation Profile: Recent Labs  Lab 02/22/22 2337  INR 1.2   Thyroid Function Tests: No results for input(s): "TSH", "T4TOTAL", "FREET4", "T3FREE", "THYROIDAB" in the last 72 hours. Lipid Profile: No results for input(s): "CHOL", "HDL", "LDLCALC", "TRIG", "CHOLHDL", "LDLDIRECT" in the last 72 hours. Anemia Panel: Recent Labs    02/23/22 1100  VITAMINB12 397  FOLATE 10.5  FERRITIN 67  TIBC 319  IRON 16*   Urine analysis:    Component Value Date/Time   COLORURINE YELLOW 02/01/2022 1620   APPEARANCEUR HAZY (A) 02/01/2022 1620   LABSPEC 1.015 02/01/2022 1620   PHURINE 7.0 02/01/2022 1620   GLUCOSEU NEGATIVE 02/01/2022 1620   HGBUR NEGATIVE 02/01/2022 1620   BILIRUBINUR NEGATIVE 02/01/2022 1620   KETONESUR NEGATIVE 02/01/2022 1620   PROTEINUR NEGATIVE 02/01/2022 1620   NITRITE NEGATIVE 02/01/2022 1620   LEUKOCYTESUR NEGATIVE 02/01/2022 1620   Sepsis Labs: Invalid input(s): "PROCALCITONIN", "LACTICIDVEN"  Microbiology: Recent Results (from the past 240 hour(s))  Resp Panel by RT-PCR (Flu A&B, Covid) Anterior Nasal Swab     Status: None   Collection Time: 02/23/22  1:39 AM   Specimen: Anterior Nasal Swab  Result Value Ref  Range Status   SARS Coronavirus 2 by RT PCR NEGATIVE NEGATIVE Final    Comment: (NOTE) SARS-CoV-2 target nucleic acids are NOT DETECTED.  The SARS-CoV-2 RNA is generally detectable in upper respiratory specimens during the acute phase of infection. The lowest concentration of SARS-CoV-2 viral copies this assay can detect is 138 copies/mL. A negative result does not preclude SARS-Cov-2 infection and should not be used as the sole basis for treatment or other patient management decisions. A negative result may occur with  improper specimen collection/handling, submission of specimen other than nasopharyngeal swab, presence of viral mutation(s) within the areas targeted by this assay, and inadequate number of viral copies(<138 copies/mL). A negative result must be combined with clinical observations, patient history, and epidemiological information. The expected result is Negative.  Fact Sheet for Patients:  BloggerCourse.com  Fact Sheet for Healthcare Providers:  SeriousBroker.it  This test is no t yet approved or cleared by the [place] FDA and  has been authorized for detection and/or diagnosis of SARS-CoV-2 by FDA under an Emergency Use Authorization (EUA). This EUA will remain  in effect (meaning this test can be used) for the duration of the COVID-19 declaration under Section 564(b)(1) of the Act, 21 U.S.C.section 360bbb-3(b)(1), unless the authorization is terminated  or revoked sooner.       Influenza A by PCR NEGATIVE NEGATIVE Final   Influenza B by PCR NEGATIVE NEGATIVE Final    Comment: (NOTE) The Xpert Xpress SARS-CoV-2/FLU/RSV plus assay is intended as an aid in the diagnosis of influenza from Nasopharyngeal swab specimens and should not be used as a sole basis for treatment. Nasal washings and aspirates are unacceptable for Xpert Xpress SARS-CoV-2/FLU/RSV testing.  Fact Sheet for  Patients: BloggerCourse.com  Fact Sheet for Healthcare Providers: SeriousBroker.it  This test is not yet approved or cleared by the [place] FDA and has been authorized for detection and/or diagnosis of SARS-CoV-2 by FDA under an Emergency Use Authorization (EUA). This EUA will remain in effect (meaning this test can be used) for the duration of the COVID-19 declaration under Section 564(b)(1)  of the Act, 21 U.S.C. section 360bbb-3(b)(1), unless the authorization is terminated or revoked.  Performed at [facility], [address] [place], [place] [address]   Culture, blood (Routine X 2) w Reflex to ID Panel     Status: None (Preliminary result)   Collection Time: 02/23/22 11:01 AM   Specimen: BLOOD RIGHT HAND  Result Value Ref Range Status   Specimen Description BLOOD RIGHT HAND  Final   Special Requests   Final    BOTTLES DRAWN AEROBIC AND ANAEROBIC Blood Culture results may not be optimal due to an inadequate volume of blood received in culture bottles   Culture   Final    NO GROWTH < 24 HOURS Performed at [facility], [address], [place], [place] [address]    Report Status PENDING  Incomplete  Culture, blood (Routine X 2) w Reflex to ID Panel     Status: None (Preliminary result)   Collection Time: 02/23/22  4:19 PM   Specimen: BLOOD  Result Value Ref Range Status   Specimen Description   Final    BLOOD RIGHT ANTECUBITAL Performed at [facility], [address] [place], [place] [address]    Special Requests   Final    BOTTLES DRAWN AEROBIC ONLY Blood Culture adequate volume Performed at [facility], [address], [place], [place] [address]    Culture   Final    NO GROWTH < 24 HOURS Performed at [facility], [address] [place], [place] [address]    Report Status PENDING  Incomplete  MRSA Next Gen by PCR, Nasal     Status: None   Collection  Time: 02/23/22  5:29 PM   Specimen: Nasal Mucosa; Nasal Swab  Result Value Ref Range Status   MRSA by PCR Next Gen NOT DETECTED NOT DETECTED Final    Comment: (NOTE) The GeneXpert MRSA Assay (FDA approved for NASAL specimens only), is one component of a comprehensive MRSA colonization surveillance program. It is not intended to diagnose MRSA infection nor to guide or monitor treatment for MRSA infections. Test performance is not FDA approved in patients less than 11 years old. Performed at [facility], [address] [place], [place] [address]     Radiology Studies: No results found.    Kaziah Krizek T. Breleigh Carpino [facility]  If 7PM-7AM, please contact night-coverage [email or web address] 02/24/2022, 3:07 PM

## 2022-02-25 ENCOUNTER — Encounter (HOSPITAL_COMMUNITY): Payer: Self-pay | Admitting: Orthopedic Surgery

## 2022-02-25 DIAGNOSIS — F419 Anxiety disorder, unspecified: Secondary | ICD-10-CM | POA: Diagnosis not present

## 2022-02-25 DIAGNOSIS — Z8673 Personal history of transient ischemic attack (TIA), and cerebral infarction without residual deficits: Secondary | ICD-10-CM

## 2022-02-25 DIAGNOSIS — I10 Essential (primary) hypertension: Secondary | ICD-10-CM | POA: Diagnosis not present

## 2022-02-25 DIAGNOSIS — W19XXXA Unspecified fall, initial encounter: Secondary | ICD-10-CM | POA: Diagnosis not present

## 2022-02-25 DIAGNOSIS — R4701 Aphasia: Secondary | ICD-10-CM

## 2022-02-25 DIAGNOSIS — M978XXA Periprosthetic fracture around other internal prosthetic joint, initial encounter: Secondary | ICD-10-CM | POA: Diagnosis not present

## 2022-02-25 DIAGNOSIS — F418 Other specified anxiety disorders: Secondary | ICD-10-CM

## 2022-02-25 DIAGNOSIS — Z66 Do not resuscitate: Secondary | ICD-10-CM

## 2022-02-25 DIAGNOSIS — D72825 Bandemia: Secondary | ICD-10-CM

## 2022-02-25 LAB — TYPE AND SCREEN
ABO/RH(D): O NEG
Antibody Screen: NEGATIVE
Unit division: 0
Unit division: 0
Unit division: 0
Unit division: 0
Unit division: 0

## 2022-02-25 LAB — BPAM RBC
Blood Product Expiration Date: 202308082359
Blood Product Expiration Date: 202308092359
Blood Product Expiration Date: 202308092359
Blood Product Expiration Date: 202308092359
Blood Product Expiration Date: 202308092359
ISSUE DATE / TIME: 202307041757
ISSUE DATE / TIME: 202307051119
ISSUE DATE / TIME: 202307051527
ISSUE DATE / TIME: 202307052121
ISSUE DATE / TIME: 202307052121
Unit Type and Rh: 9500
Unit Type and Rh: 9500
Unit Type and Rh: 9500
Unit Type and Rh: 9500
Unit Type and Rh: 9500

## 2022-02-25 LAB — CBC
HCT: 29.8 % — ABNORMAL LOW (ref 36.0–46.0)
HCT: 30.2 % — ABNORMAL LOW (ref 36.0–46.0)
Hemoglobin: 9.7 g/dL — ABNORMAL LOW (ref 12.0–15.0)
Hemoglobin: 9.8 g/dL — ABNORMAL LOW (ref 12.0–15.0)
MCH: 26.1 pg (ref 26.0–34.0)
MCH: 26.8 pg (ref 26.0–34.0)
MCHC: 32.1 g/dL (ref 30.0–36.0)
MCHC: 32.9 g/dL (ref 30.0–36.0)
MCV: 81.4 fL (ref 80.0–100.0)
MCV: 81.6 fL (ref 80.0–100.0)
Platelets: 395 10*3/uL (ref 150–400)
Platelets: 401 10*3/uL — ABNORMAL HIGH (ref 150–400)
RBC: 3.65 MIL/uL — ABNORMAL LOW (ref 3.87–5.11)
RBC: 3.71 MIL/uL — ABNORMAL LOW (ref 3.87–5.11)
RDW: 16.9 % — ABNORMAL HIGH (ref 11.5–15.5)
RDW: 17 % — ABNORMAL HIGH (ref 11.5–15.5)
WBC: 14.9 10*3/uL — ABNORMAL HIGH (ref 4.0–10.5)
WBC: 15 10*3/uL — ABNORMAL HIGH (ref 4.0–10.5)
nRBC: 0 % (ref 0.0–0.2)
nRBC: 0 % (ref 0.0–0.2)

## 2022-02-25 LAB — RENAL FUNCTION PANEL
Albumin: 2.6 g/dL — ABNORMAL LOW (ref 3.5–5.0)
Anion gap: 8 (ref 5–15)
BUN: 8 mg/dL (ref 8–23)
CO2: 24 mmol/L (ref 22–32)
Calcium: 8 mg/dL — ABNORMAL LOW (ref 8.9–10.3)
Chloride: 104 mmol/L (ref 98–111)
Creatinine, Ser: 0.42 mg/dL — ABNORMAL LOW (ref 0.44–1.00)
GFR, Estimated: >60 mL/min (ref >60)
Glucose, Bld: 180 mg/dL — ABNORMAL HIGH (ref 70–99)
Phosphorus: 3.6 mg/dL (ref 2.5–4.6)
Potassium: 3.7 mmol/L (ref 3.5–5.1)
Sodium: 136 mmol/L (ref 135–145)

## 2022-02-25 LAB — URINALYSIS, ROUTINE W REFLEX MICROSCOPIC
Bilirubin Urine: NEGATIVE
Glucose, UA: 50 mg/dL — AB
Ketones, ur: NEGATIVE mg/dL
Nitrite: NEGATIVE
Protein, ur: 30 mg/dL — AB
Specific Gravity, Urine: 1.029 (ref 1.005–1.030)
WBC, UA: >50 WBC/hpf — ABNORMAL HIGH (ref 0–5)
pH: 5 (ref 5.0–8.0)

## 2022-02-25 LAB — CREATININE, SERUM
Creatinine, Ser: 0.46 mg/dL (ref 0.44–1.00)
GFR, Estimated: >60 mL/min (ref >60)

## 2022-02-25 LAB — GLUCOSE, CAPILLARY
Glucose-Capillary: 179 mg/dL — ABNORMAL HIGH (ref 70–99)
Glucose-Capillary: 139 mg/dL — ABNORMAL HIGH (ref 70–99)
Glucose-Capillary: 195 mg/dL — ABNORMAL HIGH (ref 70–99)
Glucose-Capillary: 181 mg/dL — ABNORMAL HIGH (ref 70–99)
Glucose-Capillary: 167 mg/dL — ABNORMAL HIGH (ref 70–99)

## 2022-02-25 LAB — MAGNESIUM: Magnesium: 1.5 mg/dL — ABNORMAL LOW (ref 1.7–2.4)

## 2022-02-25 MED ORDER — FENTANYL CITRATE PF 50 MCG/ML IJ SOSY
PREFILLED_SYRINGE | INTRAMUSCULAR | Status: AC
  Filled 2022-02-25: qty 1

## 2022-02-25 MED ORDER — DEXAMETHASONE SODIUM PHOSPHATE 10 MG/ML IJ SOLN
10.0000 mg | Freq: Once | INTRAMUSCULAR | Status: AC
  Administered 2022-02-25: 10 mg via INTRAVENOUS
  Filled 2022-02-25: qty 1

## 2022-02-25 MED ORDER — OXYCODONE HCL 5 MG PO TABS: 5.0000 mg | ORAL_TABLET | ORAL | 0 refills | Status: DC | PRN

## 2022-02-25 MED ORDER — PHENOL 1.4 % MT LIQD: 1.0000 | OROMUCOSAL | Status: DC | PRN

## 2022-02-25 MED ORDER — INSULIN ASPART 100 UNIT/ML IJ SOLN
INTRAMUSCULAR | Status: AC
  Filled 2022-02-25: qty 1

## 2022-02-25 MED ORDER — QUETIAPINE FUMARATE 25 MG PO TABS
25.0000 mg | ORAL_TABLET | Freq: Every day | ORAL | Status: DC
Start: 2022-02-25 — End: 2022-02-26
  Administered 2022-02-25: 25 mg via ORAL
  Filled 2022-02-25: qty 1

## 2022-02-25 MED ORDER — MORPHINE SULFATE (PF) 2 MG/ML IV SOLN
0.5000 mg | INTRAVENOUS | Status: DC | PRN
  Administered 2022-02-25: 1 mg via INTRAVENOUS
  Filled 2022-02-25: qty 1

## 2022-02-25 MED ORDER — HALOPERIDOL LACTATE 5 MG/ML IJ SOLN
2.0000 mg | Freq: Once | INTRAMUSCULAR | Status: AC
Start: 2022-02-25 — End: 2022-02-25
  Administered 2022-02-25: 2 mg via INTRAMUSCULAR

## 2022-02-25 MED ORDER — METOCLOPRAMIDE HCL 5 MG/ML IJ SOLN: 5.0000 mg | Freq: Three times a day (TID) | INTRAMUSCULAR | Status: DC | PRN

## 2022-02-25 MED ORDER — DOCUSATE SODIUM 100 MG PO CAPS: 100.0000 mg | ORAL_CAPSULE | Freq: Two times a day (BID) | ORAL | Status: DC

## 2022-02-25 MED ORDER — METOCLOPRAMIDE HCL 5 MG PO TABS: 5.0000 mg | ORAL_TABLET | Freq: Three times a day (TID) | ORAL | Status: DC | PRN

## 2022-02-25 MED ORDER — POLYETHYLENE GLYCOL 3350 17 G PO PACK
17.0000 g | PACK | Freq: Every day | ORAL | Status: DC | PRN
  Administered 2022-02-26: 17 g via ORAL
  Filled 2022-02-25: qty 1

## 2022-02-25 MED ORDER — DIPHENHYDRAMINE HCL 12.5 MG/5ML PO ELIX: 12.5000 mg | ORAL_SOLUTION | ORAL | Status: DC | PRN

## 2022-02-25 MED ORDER — ONDANSETRON HCL 4 MG PO TABS: 4.0000 mg | ORAL_TABLET | Freq: Four times a day (QID) | ORAL | Status: DC | PRN

## 2022-02-25 MED ORDER — HYDROCODONE-ACETAMINOPHEN 7.5-325 MG PO TABS
1.0000 | ORAL_TABLET | ORAL | Status: DC | PRN
  Administered 2022-02-25: 2 via ORAL
  Administered 2022-02-25 (×2): 1 via ORAL
  Filled 2022-02-25 (×2): qty 1
  Filled 2022-02-25: qty 2

## 2022-02-25 MED ORDER — SENNA 8.6 MG PO TABS
1.0000 | ORAL_TABLET | Freq: Two times a day (BID) | ORAL | Status: DC
  Administered 2022-02-25 (×2): 8.6 mg via ORAL
  Filled 2022-02-25 (×3): qty 1

## 2022-02-25 MED ORDER — ALUM & MAG HYDROXIDE-SIMETH 200-200-20 MG/5ML PO SUSP: 30.0000 mL | ORAL | Status: DC | PRN

## 2022-02-25 MED ORDER — HYDROCODONE-ACETAMINOPHEN 5-325 MG PO TABS
1.0000 | ORAL_TABLET | ORAL | Status: DC | PRN
  Administered 2022-02-25: 1 via ORAL
  Filled 2022-02-25: qty 1

## 2022-02-25 MED ORDER — ACETAMINOPHEN 325 MG PO TABS
325.0000 mg | ORAL_TABLET | Freq: Four times a day (QID) | ORAL | Status: DC | PRN
  Administered 2022-02-25 – 2022-02-26 (×2): 650 mg via ORAL
  Filled 2022-02-25 (×2): qty 2

## 2022-02-25 MED ORDER — ENOXAPARIN SODIUM 30 MG/0.3ML IJ SOSY
30.0000 mg | PREFILLED_SYRINGE | INTRAMUSCULAR | Status: DC
Start: 2022-02-25 — End: 2022-02-25

## 2022-02-25 MED ORDER — MENTHOL 3 MG MT LOZG: 1.0000 | LOZENGE | OROMUCOSAL | Status: DC | PRN

## 2022-02-25 MED ORDER — ONDANSETRON HCL 4 MG/2ML IJ SOLN: 4.0000 mg | Freq: Four times a day (QID) | INTRAMUSCULAR | Status: DC | PRN

## 2022-02-25 NOTE — Anesthesia Postprocedure Evaluation (Signed)
Anesthesia Post Note  Patient: Rebecca Barnes  Procedure(s) Performed: FEMUR COMPONENT REVISION (Right: Hip)     Patient location during evaluation: PACU Anesthesia Type: General Level of consciousness: awake and alert and confused (same as preop status) Pain management: pain level controlled Vital Signs Assessment: post-procedure vital signs reviewed and stable Respiratory status: spontaneous breathing, nonlabored ventilation and respiratory function stable Cardiovascular status: blood pressure returned to baseline Postop Assessment: no apparent nausea or vomiting Anesthetic complications: no   No notable events documented.              Shanda Howells

## 2022-02-25 NOTE — Plan of Care (Signed)
?  Problem: Activity: ?Goal: Risk for activity intolerance will decrease ?Outcome: Progressing ?  ?Problem: Safety: ?Goal: Ability to remain free from injury will improve ?Outcome: Progressing ?  ?Problem: Pain Managment: ?Goal: General experience of comfort will improve ?Outcome: Progressing ?  ?

## 2022-02-25 NOTE — Progress Notes (Signed)
PROGRESS NOTE  Rebecca Barnes ZGY:[id number] DOB: 1945-12-02   PCP: Caesar Bookman, NP  Patient is from: SNF  DOA: 02/22/2022 LOS: 2  Chief complaints Chief Complaint  Patient presents with   Fall     Brief Narrative / Interim history: 76 year old F with PMH of CVA with residual aphasia, DM-2, HTN, anemia, GIB and recent right THA on 02/02/2022 returning from SNF with right hip pain after accidental fall out of her bed.  She was admitted for periprosthetic hip fracture, ABLA in the setting of right hip postsurgical hematoma and agitation.  Orthopedic surgery consulted.  Patient underwent revision femoral component of right THA by Dr. Linna Caprice on 02/24/2022.  EBL about 1200 cc.  She was transfused about 3 units.  She was started on IV ceftriaxone.  She will be 50% WB on RLE with a walker and posterior hip precautions.   Subjective: Seen and examined earlier this morning.  No major events overnight of this morning.  She is a sleepy but wakes to voice.  She is somewhat confused.  Difficult to understand what she is saying due to aphasia.  She follows some commands.  Somewhat anxious.  Objective: Vitals:   02/25/22 0116 02/25/22 0116 02/25/22 0311 02/25/22 0630  BP:  (!) 133/56 125/67 (!) 141/70  Pulse:  (!) 104 97 98  Resp: 10  18 18   Temp:  97.7 F (36.5 C) (!) 97.5 F (36.4 C) 98.1 F (36.7 C)  TempSrc:  Oral Oral Oral  SpO2:   99% 96%  Weight:    97.5 kg  Height:        Examination:  GENERAL: No apparent distress.  Nontoxic. HEENT: MMM.  Vision and hearing grossly intact.  NECK: Supple.  No apparent JVD.  RESP:  No IWOB.  Fair aeration bilaterally. CVS:  RRR. Heart sounds normal.  ABD/GI/GU: BS+. Abd soft, NTND.  MSK/EXT:  Moves extremities. No apparent deformity. No edema.  SKIN: Dressing over surgical site DCI. NEURO: Sleepy but wakes to voice.  Follows some commands.  No apparent focal neuro deficit. PSYCH: Somewhat anxious.  Procedures:  7/5-revision femoral  component of right THA by Dr.  Microbiology summarized: None  Assessment and plan: Principal Problem:   Periprosthetic hip fracture Active Problems:   Fall at nursing home   Anxiety   Anxiety with depression   Type 2 diabetes mellitus without complication, without long-term current use of insulin (HCC)   Leukocytosis   Essential hypertension   Hypokalemia   History of CVA (cerebrovascular accident)   Expressive aphasia   HLD (hyperlipidemia)   DNR (do not resuscitate)   Normocytic anemia   Bandemia   Hypomagnesemia   Morbid obesity (HCC)  Fall at nursing home Periprosthetic hip fracture Recent right total hip arthroplasty -7/5-revision femoral component of right THA by Dr. Linna Caprice -Started on IV ceftriaxone postoperatively "due to rotation of previous incision" -50% weightbearing on RLE with walker and posterior hip precaution -Pain control -DVT prophylaxis per surgery -PT/OT   Acute postoperative blood loss anemia: CT showed large hematoma.  EBL 1200 cc.  H&H stable after 5 units.  Iron deficiency anemia/vitamin B12 deficiency Recent Labs    02/15/22 0519 02/22/22 2337 02/22/22 2347 02/23/22 0445 02/23/22 1100 02/23/22 2316 02/24/22 0917 02/24/22 2031 02/24/22 2345 02/25/22 0329  HGB 8.0* 7.4* 7.8* 7.1* 6.9* 8.7* 9.3* 10.2* 10.5* 9.7*  9.8*  -Baseline Hgb about 13. -CT showed right hip fluid collection measuring 10.5 x 5.4 cm on 6/25>>> 16 x 6  x 12 cm on 7/4 -Received IV ferric gluconate and B12 injection on 7/4. -Continue holding DAPT -Monitor H&H  History of CVA with residual aphasia: On DAPT prior to hospitalization. -Holding DAPT.  She will be high risk for bleeding given history of GIB -We will resume Plavix in the next 24 to 48 hours.  Controlled NIDDM-2 with hyperglycemia: A1c 6.3%.  On metformin at home. Recent Labs  Lab 02/24/22 1143 02/24/22 1650 02/24/22 2331 02/25/22 0400 02/25/22 0730  GLUCAP 131* 132* 179* 179* 139*   -Continue SSI-very sensitive  Essential hypertension: Normotensive. -Continue holding home amlodipine   Anxiety/agitation/mood disorder: Seems to have resolved.  She might have some underlying cognitive impairment -Continue scheduled Klonopin, Seroquel and Haldol as needed -Continue home Prozac.  Leukocytosis/bandemia: Likely demargination. -Continue monitoring  Hypokalemia/hypomagnesemia: -IV magnesium sulfate 2 g x 1  Morbid obesity with comorbidity as above Body mass index is 35.76 kg/m.          DVT prophylaxis:  enoxaparin (LOVENOX) injection 30 mg Start: 02/25/22 1600 SCDs Start: 02/24/22 2234  Code Status: DNR/DNI Family Communication: Attempted to call patient's daughter for update but no answer. Level of care: Med-Surg Status is: Inpatient Remains inpatient appropriate because: Periprosthetic right hip fracture   Final disposition: Likely SNF. Consultants:  Orthopedic surgery  Sch Meds:  Scheduled Meds:  clonazePAM  0.5 mg Oral BID   docusate sodium  100 mg Oral BID   enoxaparin (LOVENOX) injection  30 mg Subcutaneous Q24H   fenofibrate  54 mg Oral q morning   FLUoxetine  40 mg Oral Daily   insulin aspart  0-6 Units Subcutaneous Q4H   senna  1 tablet Oral BID   vitamin B-12  1,000 mcg Oral Daily   Continuous Infusions:  sodium chloride 75 mL/hr at 02/25/22 0238   sodium chloride     cefTRIAXone (ROCEPHIN)  IV 2 g (02/25/22 0241)   methocarbamol (ROBAXIN) IV     PRN Meds:.acetaminophen, alum & mag hydroxide-simeth, diphenhydrAMINE, haloperidol lactate, HYDROcodone-acetaminophen, HYDROcodone-acetaminophen, hydrOXYzine, melatonin, menthol-cetylpyridinium **OR** phenol, methocarbamol **OR** methocarbamol (ROBAXIN) IV, metoCLOPramide **OR** metoCLOPramide (REGLAN) injection, morphine injection, naLOXone (NARCAN)  injection, ondansetron **OR** ondansetron (ZOFRAN) IV, polyethylene glycol  Antimicrobials: Anti-infectives (From admission, onward)     Start     Dose/Rate Route Frequency Ordered Stop   02/24/22 2300  cefTRIAXone (ROCEPHIN) 2 g in sodium chloride 0.9 % 100 mL IVPB        2 g 200 mL/hr over 30 Minutes Intravenous Every 24 hours 02/24/22 2235     02/24/22 2247  ceFAZolin (ANCEF) 2-4 GM/100ML-% IVPB       Note to Pharmacy: Kevan Nyenny, Cheryl B: cabinet override      02/24/22 2247 02/24/22 2249   02/24/22 1115  ceFAZolin (ANCEF) IVPB 2g/100 mL premix        2 g 200 mL/hr over 30 Minutes Intravenous On call to O.R. 02/24/22 1021 02/24/22 2304   02/24/22 0600  ceFAZolin (ANCEF) IVPB 2g/100 mL premix  Status:  Discontinued        2 g 200 mL/hr over 30 Minutes Intravenous On call to O.R. 02/23/22 1714 02/24/22 1234        I have personally reviewed the following labs and images: CBC: Recent Labs  Lab 02/23/22 0445 02/23/22 1100 02/23/22 2316 02/24/22 0917 02/24/22 2031 02/24/22 2345 02/25/22 0329  WBC 9.3 7.9  --  10.4  --  17.9* 15.0*  14.9*  NEUTROABS 6.5  --   --   --   --   --   --  HGB 7.1* 6.9* 8.7* 9.3* 10.2* 10.5* 9.7*  9.8*  HCT 23.5* 22.3* 27.6* 29.7* 30.0* 32.3* 30.2*  29.8*  MCV 82.2 81.4  --  82.7  --  82.0 81.4  81.6  PLT 591* 574*  --  544*  --  413* 395  401*   BMP &GFR Recent Labs  Lab 02/22/22 2337 02/22/22 2347 02/23/22 0445 02/24/22 0917 02/24/22 2031 02/25/22 0329  NA 138 138 141 138 136 136  K 3.4* 3.4* 3.8 4.2 3.5 3.7  CL 107 106 104 107 100 104  CO2 22  --  25 24  --  24  GLUCOSE 164* 164* 150* 147* 136* 180*  BUN 13 11 12  7* 4* 8  CREATININE 0.64 0.40* 0.66 0.53 0.30* 0.42*  0.46  CALCIUM 8.8*  --  9.2 8.8*  --  8.0*  MG  --   --  1.7 1.7  --  1.5*  PHOS  --   --   --  2.8  --  3.6   Estimated Creatinine Clearance: 70.2 mL/min (A) (by C-G formula based on SCr of 0.42 mg/dL (L)). Liver & Pancreas: Recent Labs  Lab 02/22/22 2337 02/23/22 0445 02/24/22 0917 02/25/22 0329  AST 18 17  --   --   ALT 11 11  --   --   ALKPHOS 89 91  --   --   BILITOT 0.4 0.5  --   --    PROT 5.9* 6.2*  --   --   ALBUMIN 2.7* 2.7* 3.0* 2.6*   No results for input(s): "LIPASE", "AMYLASE" in the last 168 hours. No results for input(s): "AMMONIA" in the last 168 hours. Diabetic: No results for input(s): "HGBA1C" in the last 72 hours. Recent Labs  Lab 02/24/22 1143 02/24/22 1650 02/24/22 2331 02/25/22 0400 02/25/22 0730  GLUCAP 131* 132* 179* 179* 139*   Cardiac Enzymes: Recent Labs  Lab 02/23/22 1100  CKTOTAL 127   No results for input(s): "PROBNP" in the last 8760 hours. Coagulation Profile: Recent Labs  Lab 02/22/22 2337  INR 1.2   Thyroid Function Tests: No results for input(s): "TSH", "T4TOTAL", "FREET4", "T3FREE", "THYROIDAB" in the last 72 hours. Lipid Profile: No results for input(s): "CHOL", "HDL", "LDLCALC", "TRIG", "CHOLHDL", "LDLDIRECT" in the last 72 hours. Anemia Panel: Recent Labs    02/23/22 1100  VITAMINB12 397  FOLATE 10.5  FERRITIN 67  TIBC 319  IRON 16*   Urine analysis:    Component Value Date/Time   COLORURINE AMBER (A) 02/25/2022 0112   APPEARANCEUR CLOUDY (A) 02/25/2022 0112   LABSPEC 1.029 02/25/2022 0112   PHURINE 5.0 02/25/2022 0112   GLUCOSEU 50 (A) 02/25/2022 0112   HGBUR SMALL (A) 02/25/2022 0112   BILIRUBINUR NEGATIVE 02/25/2022 0112   KETONESUR NEGATIVE 02/25/2022 0112   PROTEINUR 30 (A) 02/25/2022 0112   NITRITE NEGATIVE 02/25/2022 0112   LEUKOCYTESUR LARGE (A) 02/25/2022 0112   Sepsis Labs: Invalid input(s): "PROCALCITONIN", "LACTICIDVEN"  Microbiology: Recent Results (from the past 240 hour(s))  Resp Panel by RT-PCR (Flu A&B, Covid) Anterior Nasal Swab     Status: None   Collection Time: 02/23/22  1:39 AM   Specimen: Anterior Nasal Swab  Result Value Ref Range Status   SARS Coronavirus 2 by RT PCR NEGATIVE NEGATIVE Final    Comment: (NOTE) SARS-CoV-2 target nucleic acids are NOT DETECTED.  The SARS-CoV-2 RNA is generally detectable in upper respiratory specimens during the acute phase of  infection. The lowest concentration of SARS-CoV-2 viral copies  this assay can detect is 138 copies/mL. A negative result does not preclude SARS-Cov-2 infection and should not be used as the sole basis for treatment or other patient management decisions. A negative result may occur with  improper specimen collection/handling, submission of specimen other than nasopharyngeal swab, presence of viral mutation(s) within the areas targeted by this assay, and inadequate number of viral copies(<138 copies/mL). A negative result must be combined with clinical observations, patient history, and epidemiological information. The expected result is Negative.  Fact Sheet for Patients:  BloggerCourse.com  Fact Sheet for Healthcare Providers:  SeriousBroker.it  This test is no t yet approved or cleared by the [place] FDA and  has been authorized for detection and/or diagnosis of SARS-CoV-2 by FDA under an Emergency Use Authorization (EUA). This EUA will remain  in effect (meaning this test can be used) for the duration of the COVID-19 declaration under Section 564(b)(1) of the Act, 21 U.S.C.section 360bbb-3(b)(1), unless the authorization is terminated  or revoked sooner.       Influenza A by PCR NEGATIVE NEGATIVE Final   Influenza B by PCR NEGATIVE NEGATIVE Final    Comment: (NOTE) The Xpert Xpress SARS-CoV-2/FLU/RSV plus assay is intended as an aid in the diagnosis of influenza from Nasopharyngeal swab specimens and should not be used as a sole basis for treatment. Nasal washings and aspirates are unacceptable for Xpert Xpress SARS-CoV-2/FLU/RSV testing.  Fact Sheet for Patients: BloggerCourse.com  Fact Sheet for Healthcare Providers: SeriousBroker.it  This test is not yet approved or cleared by the [place] FDA and has been authorized for detection and/or diagnosis of SARS-CoV-2  by FDA under an Emergency Use Authorization (EUA). This EUA will remain in effect (meaning this test can be used) for the duration of the COVID-19 declaration under Section 564(b)(1) of the Act, 21 U.S.C. section 360bbb-3(b)(1), unless the authorization is terminated or revoked.  Performed at [facility], [address], [place], [place] [address]   Culture, blood (Routine X 2) w Reflex to ID Panel     Status: None (Preliminary result)   Collection Time: 02/23/22 11:01 AM   Specimen: BLOOD RIGHT HAND  Result Value Ref Range Status   Specimen Description BLOOD RIGHT HAND  Final   Special Requests   Final    BOTTLES DRAWN AEROBIC AND ANAEROBIC Blood Culture results may not be optimal due to an inadequate volume of blood received in culture bottles   Culture   Final    NO GROWTH 2 DAYS Performed at [facility], [address], [place], [place] [address]    Report Status PENDING  Incomplete  Culture, blood (Routine X 2) w Reflex to ID Panel     Status: None (Preliminary result)   Collection Time: 02/23/22  4:19 PM   Specimen: BLOOD  Result Value Ref Range Status   Specimen Description   Final    BLOOD RIGHT ANTECUBITAL Performed at [facility], [address] [place], [place] [address]    Special Requests   Final    BOTTLES DRAWN AEROBIC ONLY Blood Culture adequate volume Performed at [facility], [address] [place], [place] [address]    Culture   Final    NO GROWTH 2 DAYS Performed at [facility], [address] [place], [place] [address]    Report Status PENDING  Incomplete  MRSA Next Gen by PCR, Nasal     Status: None   Collection Time: 02/23/22  5:29 PM   Specimen: Nasal Mucosa; Nasal Swab  Result Value Ref Range Status   MRSA by PCR Next Gen NOT DETECTED NOT DETECTED Final    Comment: (NOTE) The GeneXpert MRSA Assay (FDA approved for NASAL specimens only), is one component of a comprehensive MRSA  colonization surveillance program. It is not intended to diagnose MRSA infection nor to guide or monitor treatment for MRSA infections. Test performance is not FDA approved in patients less than 45 years old. Performed at [facility], [address], [place], [place] [address]     Radiology Studies: DG Hip Port Unilat With Pelvis 1V Right  Result Date: 02/25/2022 CLINICAL DATA:  Status post ORIF of right femoral fracture with prosthesis extension EXAM: DG HIP (WITH OR WITHOUT PELVIS) 1V PORT RIGHT COMPARISON:  Intraoperative films from earlier in the same day. FINDINGS: New right hip prosthesis is noted extending distally into the femur. Multiple cerclage wires are noted with reduction of the previously seen fracture. IMPRESSION: Status post ORIF of right femoral fracture. Electronically Signed   By: Alcide Clever M.D.   On: 02/25/2022 00:06   DG FEMUR, MIN 2 VIEWS RIGHT  Result Date: 02/24/2022 CLINICAL DATA:  Periprosthetic right femoral fracture EXAM: RIGHT FEMUR 2 VIEWS COMPARISON:  Films from the previous day. FLUOROSCOPY TIME:  Radiation Exposure Index (as provided by the fluoroscopic device): 4.76 mGy If the device does not provide the exposure index: Fluoroscopy Time:  21 seconds Number of Acquired Images:  6 FINDINGS: Initial images demonstrate multiple cerclage wires surrounding the femur. The prosthesis is again identified. Cerclage wires within tightened to allow approximation of the fracture fragments. The prosthesis was then extended deeper into the femur. IMPRESSION: ORIF of right periprosthetic femoral fracture. Electronically Signed   By: Alcide Clever M.D.   On: 02/24/2022 22:43   DG C-Arm 1-60 Min-No Report  Result Date: 02/24/2022 Fluoroscopy was utilized by the requesting physician.  No radiographic interpretation.   DG C-Arm 1-60 Min-No Report  Result Date: 02/24/2022 Fluoroscopy was utilized by the requesting physician.  No radiographic interpretation.    DG C-Arm 1-60 Min-No Report  Result Date: 02/24/2022 Fluoroscopy was utilized by the requesting physician.  No radiographic interpretation.      Leotha Voeltz T. Landynn Dupler [facility]  If 7PM-7AM, please contact night-coverage [email or web address] 02/25/2022, 11:56 AM

## 2022-02-25 NOTE — Plan of Care (Signed)
Plan of care reviewed. Continue current plan. 

## 2022-02-25 NOTE — Progress Notes (Addendum)
Subjective:  Patient is a poor historian due to aphasia limiting subjective and physical exam. Patient is able to say yes and no, unsure of accuracy or baseline. She is grabbing at her leg some today as if pain. She does not appear to be in distress. No acute events reported.   Objective:   VITALS:   Vitals:   02/25/22 0116 02/25/22 0311 02/25/22 0630 02/25/22 1308  BP: (!) 133/56 125/67 (!) 141/70 130/90  Pulse: (!) 104 97 98 (!) 105  Resp:  _0 Temp: 97.7 F (36.5 C) (!) 97.5 F (36.4 C) 98.1 F (36.7 C) 98.7 F (37.1 C)  TempSrc: Oral Oral Oral Oral  SpO2:  99% 96% 97%  Weight:   97.5 kg   Height:        Patient is lying in bed on her operative side. She does not appear to be in distress. She appears more alert today and can follow commands during examination. Exam limited due to aphasia.  Abd soft.  Neurovascular intact Unable to fully assess sensory function due to aphasia.  Intact pulses distally.  Compartment soft.  She has bruising over right hip and buttock area. She has mild swelling.  She is able to plantar flex and dorsflex her foot slightly.   Mepilex dressing over anterior incision from 02/02/22 with steri strips.   Lab Results  Component Value Date   WBC 15.0 (H) 02/25/2022   WBC 14.9 (H) 02/25/2022   HGB 9.7 (L) 02/25/2022   HGB 9.8 (L) 02/25/2022   HCT 30.2 (L) 02/25/2022   HCT 29.8 (L) 02/25/2022   MCV 81.4 02/25/2022   MCV 81.6 02/25/2022   PLT 395 02/25/2022   PLT 401 (H) 02/25/2022   BMET    Component Value Date/Time   NA 136 02/25/2022 0329   K 3.7 02/25/2022 0329   CL 104 02/25/2022 0329   CO2 24 02/25/2022 0329   GLUCOSE 180 (H) 02/25/2022 0329   BUN 8 02/25/2022 0329   CREATININE 0.42 (L) 02/25/2022 0329   CREATININE 0.46 02/25/2022 0329   CREATININE 0.52 (L) 12/02/2021 1103   CALCIUM 8.0 (L) 02/25/2022 0329   EGFR 97 12/02/2021 1103   GFRNONAA >60 02/25/2022 0329   GFRNONAA >60 02/25/2022 0329      Assessment/Plan: 1 Day Post-Op   Principal Problem:   Periprosthetic hip fracture Active Problems:   Anxiety   Anxiety with depression   Type 2 diabetes mellitus without complication, without long-term current use of insulin (HCC)   Leukocytosis   Essential hypertension   Hypokalemia   History of CVA (cerebrovascular accident)   Expressive aphasia   HLD (hyperlipidemia)   DNR (do not resuscitate)   Normocytic anemia   Fall at nursing home   Bandemia   Hypomagnesemia   Morbid obesity (HCC)   50% weight bearing with walker DVT ppx: Patient is on plavix at baseline. Hold other DVT ppx due to recent GI bleed. , SCDs, TEDS PO pain control PT/OT Dispo: Disposition per medical team. Likely d/c to SNF per notes. Hold chemical DVT ppx due to recent GI bleed. Continue plavix. Continue IV ceftriaxone during hospital stay due to irritation over previous incision. Pain medication printed in chart.    Charlott Rakes, PA-C 02/25/2022, 4:02 PM  [facility]  [place] [address], [address], [place], [place] [address] Phone: [phone] [email or web address] Facebook  [employer]

## 2022-02-25 NOTE — Progress Notes (Signed)
IVT visit x2.  RN was at bedside in patient care (hygiene).  Advised RN to place new consult when ready for pt to be assessed for PIV.

## 2022-02-25 NOTE — Progress Notes (Signed)
Staff noted fc out with inflated bulb.

## 2022-02-25 NOTE — Progress Notes (Signed)
Pt. Pulled out PIVs multiple times. This IV Nurse requested RN to keep pt's mittens on to protect pt's lines/PIV.

## 2022-02-25 NOTE — Evaluation (Signed)
Physical Therapy Evaluation Patient Details Name: Rebecca Barnes MRN: [id number] DOB: 1946-06-01 Today's Date: 02/25/2022  History of Present Illness  76 y.o. female admitted after unwitnessed fall at facility resulting periprosthetic hip fracture and s/p Revision femoral component right total hip arthroplasty on 02/24/22.   Pt with medical history significant of anxiety; hypertension, diabetes mellitus, CVA.  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  Pt then sustained a fall resulting in right femoral neck fracture, underwent right total hip arthroplasty on 02/02/2022. Pt with recent admission on 02/14/22 from [facility] with rectal bleeding.  Clinical Impression  Patient is s/p above surgery resulting in functional limitations due to the deficits listed below (see PT Problem List).  Patient will benefit from skilled PT to increase their independence and safety with mobility to allow discharge to the venue listed below.  Pt initiating movement however felt unable to tolerate getting to EOB due to pain.  Pt agreeable for therapist to assist with rolling for bed linen change and RN notified of pt's pain and to bring pain meds.  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  Pt now with posterior hip precautions and PWB status.        Recommendations for follow up therapy are one component of a multi-disciplinary discharge planning process, led by the attending physician.  Recommendations may be updated based on patient status, additional functional criteria and insurance authorization.  Follow Up Recommendations Skilled nursing-short term rehab (<3 hours/day) Can patient physically be transported by private vehicle: No    Assistance Recommended at Discharge Frequent or constant  Supervision/Assistance  Patient can return home with the following  Assistance with cooking/housework;Assist for transportation;Help with stairs or ramp for entrance;Two people to help with walking and/or transfers;Two people to help with bathing/dressing/bathroom    Equipment Recommendations None recommended by PT  Recommendations for Other Services       Functional Status Assessment Patient has had a recent decline in their functional status and demonstrates the ability to make significant improvements in function in a reasonable and predictable amount of time.     Precautions / Restrictions Precautions Precautions: Fall;Posterior Hip Restrictions Weight Bearing Restrictions: Yes RLE Weight Bearing: Partial weight bearing RLE Partial Weight Bearing Percentage or Pounds: 50%      Mobility  Bed Mobility Overal bed mobility: Needs Assistance Bed Mobility: Rolling Rolling: Total assist         General bed mobility comments: pt initiating reaching and moving Rt LE however requiring significant assist due to pain; attempted getting to EOB however pt stopped due to pain, assisted pt with rolling to change linen and for hygiene (found foley catheter with bulb still inflated in bed with pt on arrival and bed soaked with urine - RN notified)    Transfers                        Ambulation/Gait                  Stairs            Wheelchair Mobility    Modified Rankin (Stroke Patients Only)       Balance  Pertinent Vitals/Pain Pain Assessment Pain Assessment: Faces Faces Pain Scale: Hurts whole lot Pain Location: right hip with movement Pain Descriptors / Indicators: Grimacing, Guarding, Moaning Pain Intervention(s): Repositioned, Monitored during session, Patient requesting pain meds-RN notified    Home Living Family/patient expects to be discharged to:: Skilled nursing  facility Living Arrangements: Children;Other (Comment) Available Help at Discharge: Family;Personal care attendant;Available 24 hours/day (chart states pt has caregiver - need to confirm) Type of Home: House Home Access: Ramped entrance       Home Layout: Two level;Full bath on main level;Able to live on main level with bedroom/bathroom Home Equipment: Rolling Walker (2 wheels);Shower seat;Grab bars - toilet;Grab bars - tub/shower Additional Comments: all information taken from chart due to patient's communication deficits/expressive aphasia    Prior Function Prior Level of Function : Needs assist       Physical Assist : Mobility (physical);ADLs (physical) Mobility (physical): Gait ADLs (physical): Bathing;IADLs Mobility Comments: Prior to initial hip fx: Supervision for transfers due to right visual field deficit, was able to ambulate with RW at DC from CIR ADLs Comments: Prior to inital hip fx: Pt typically Mod I with ADLs. Noted per chart, pt min guard for showering tasks d/t anxiety at CIR DC, completing other ADLs with Supervision at most     Hand Dominance   Dominant Hand: Right    Extremity/Trunk Assessment        Lower Extremity Assessment Lower Extremity Assessment: RLE deficits/detail RLE Deficits / Details: pt not allowing assist, reporting pain RLE: Unable to fully assess due to pain       Communication   Communication: Expressive difficulties (had moments of better speech with word salad)  Cognition Arousal/Alertness: Awake/alert   Overall Cognitive Status: Difficult to assess Area of Impairment: Problem solving, Following commands, Safety/judgement                       Following Commands: Follows one step commands inconsistently Safety/Judgement: Decreased awareness of safety, Decreased awareness of deficits   Problem Solving: Difficulty sequencing, Requires verbal cues, Requires tactile cues General Comments: hx of expressive aphasia, pt  with nonsensical statements at times however answering yes/no questions appropriately        General Comments      Exercises     Assessment/Plan    PT Assessment Patient needs continued PT services  PT Problem List Decreased activity tolerance;Decreased balance;Decreased mobility;Decreased knowledge of use of DME;Decreased safety awareness;Decreased knowledge of precautions;Pain;Decreased strength       PT Treatment Interventions DME instruction;Gait training;Functional mobility training;Therapeutic activities;Therapeutic exercise;Balance training;Patient/family education;Wheelchair mobility training    PT Goals (Current goals can be found in the Care Plan section)  Acute Rehab PT Goals PT Goal Formulation: Patient unable to participate in goal setting Time For Goal Achievement: 03/11/22 Potential to Achieve Goals: Fair    Frequency Min 2X/week     Co-evaluation               AM-PAC PT "6 Clicks" Mobility  Outcome Measure Help needed turning from your back to your side while in a flat bed without using bedrails?: Total Help needed moving from lying on your back to sitting on the side of a flat bed without using bedrails?: Total Help needed moving to and from a bed to a chair (including a wheelchair)?: Total Help needed standing up from a chair using your arms (e.g., wheelchair or bedside chair)?: Total Help needed to walk in hospital room?: Total Help needed climbing  3-5 steps with a railing? : Total 6 Click Score: 6    End of Session   Activity Tolerance: Patient limited by pain Patient left: in bed;with call bell/phone within reach;with bed alarm set Nurse Communication: Mobility status PT Visit Diagnosis: History of falling (Z91.81);Other abnormalities of gait and mobility (R26.89) Pain - Right/Left: Right Pain - part of body: Hip    Time: 1220-1236 PT Time Calculation (min) (ACUTE ONLY): 16 min   Charges:   PT Evaluation $PT Eval Low Complexity: 1  Low        Kati PT, DPT Physical Therapist Acute Rehabilitation Services Preferred contact method: Secure Chat Weekend Pager Only: [phone] Office: [phone]   Kati L Payson 02/25/2022, 3:08 PM

## 2022-02-25 NOTE — Progress Notes (Signed)
Patient is yelling, pulled out her FC, surgical dressing, IV access. Vistaril given no result. Notified MD haldol IM x1 dose.

## 2022-02-26 DIAGNOSIS — D72825 Bandemia: Secondary | ICD-10-CM | POA: Diagnosis not present

## 2022-02-26 DIAGNOSIS — F418 Other specified anxiety disorders: Secondary | ICD-10-CM | POA: Diagnosis not present

## 2022-02-26 DIAGNOSIS — I10 Essential (primary) hypertension: Secondary | ICD-10-CM | POA: Diagnosis not present

## 2022-02-26 DIAGNOSIS — D62 Acute posthemorrhagic anemia: Secondary | ICD-10-CM

## 2022-02-26 DIAGNOSIS — Z66 Do not resuscitate: Secondary | ICD-10-CM | POA: Diagnosis not present

## 2022-02-26 DIAGNOSIS — D509 Iron deficiency anemia, unspecified: Secondary | ICD-10-CM

## 2022-02-26 LAB — GLUCOSE, CAPILLARY
Glucose-Capillary: 122 mg/dL — ABNORMAL HIGH (ref 70–99)
Glucose-Capillary: 122 mg/dL — ABNORMAL HIGH (ref 70–99)
Glucose-Capillary: 123 mg/dL — ABNORMAL HIGH (ref 70–99)
Glucose-Capillary: 158 mg/dL — ABNORMAL HIGH (ref 70–99)

## 2022-02-26 LAB — CBC
HCT: 29.9 % — ABNORMAL LOW (ref 36.0–46.0)
Hemoglobin: 9.7 g/dL — ABNORMAL LOW (ref 12.0–15.0)
MCH: 26.4 pg (ref 26.0–34.0)
MCHC: 32.4 g/dL (ref 30.0–36.0)
MCV: 81.3 fL (ref 80.0–100.0)
Platelets: 336 10*3/uL (ref 150–400)
RBC: 3.68 MIL/uL — ABNORMAL LOW (ref 3.87–5.11)
RDW: 17.2 % — ABNORMAL HIGH (ref 11.5–15.5)
WBC: 13.5 10*3/uL — ABNORMAL HIGH (ref 4.0–10.5)
nRBC: 0 % (ref 0.0–0.2)

## 2022-02-26 LAB — RENAL FUNCTION PANEL
Albumin: 2.5 g/dL — ABNORMAL LOW (ref 3.5–5.0)
Anion gap: 7 (ref 5–15)
BUN: 8 mg/dL (ref 8–23)
CO2: 25 mmol/L (ref 22–32)
Calcium: 8.6 mg/dL — ABNORMAL LOW (ref 8.9–10.3)
Chloride: 105 mmol/L (ref 98–111)
Creatinine, Ser: 0.45 mg/dL (ref 0.44–1.00)
GFR, Estimated: >60 mL/min (ref >60)
Glucose, Bld: 132 mg/dL — ABNORMAL HIGH (ref 70–99)
Phosphorus: 2.5 mg/dL (ref 2.5–4.6)
Potassium: 3.7 mmol/L (ref 3.5–5.1)
Sodium: 137 mmol/L (ref 135–145)

## 2022-02-26 LAB — MAGNESIUM: Magnesium: 1.7 mg/dL (ref 1.7–2.4)

## 2022-02-26 MED ORDER — FLEET ENEMA 7-19 GM/118ML RE ENEM
1.0000 | ENEMA | Freq: Every day | RECTAL | 0 refills | Status: DC | PRN
Start: 2022-02-26 — End: 2022-03-08

## 2022-02-26 MED ORDER — CEFADROXIL 500 MG PO CAPS: 1000.0000 mg | ORAL_CAPSULE | Freq: Two times a day (BID) | ORAL | 0 refills | Status: DC

## 2022-02-26 MED ORDER — SENNA 8.6 MG PO TABS
2.0000 | ORAL_TABLET | Freq: Two times a day (BID) | ORAL | Status: DC
  Administered 2022-02-26: 17.2 mg via ORAL
  Filled 2022-02-26: qty 2

## 2022-02-26 MED ORDER — CYANOCOBALAMIN 1000 MCG PO TABS: 1000.0000 ug | ORAL_TABLET | Freq: Every day | ORAL | Status: DC

## 2022-02-26 MED ORDER — SENNOSIDES-DOCUSATE SODIUM 8.6-50 MG PO TABS: 1.0000 | ORAL_TABLET | Freq: Two times a day (BID) | ORAL | Status: DC | PRN

## 2022-02-26 MED ORDER — MAGNESIUM SULFATE 2 GM/50ML IV SOLN
2.0000 g | Freq: Once | INTRAVENOUS | Status: AC
  Administered 2022-02-26: 2 g via INTRAVENOUS
  Filled 2022-02-26: qty 50

## 2022-02-26 MED ORDER — NALOXONE HCL 0.4 MG/ML IJ SOLN: 0.4000 mg | INTRAMUSCULAR | Status: DC | PRN

## 2022-02-26 MED ORDER — CLOPIDOGREL BISULFATE 75 MG PO TABS
75.0000 mg | ORAL_TABLET | Freq: Every day | ORAL | Status: DC
  Administered 2022-02-26: 75 mg via ORAL
  Filled 2022-02-26: qty 1

## 2022-02-26 NOTE — Progress Notes (Signed)
The patient is alert, calm, and has been seen by her physician. The orders for discharge were written. IV has been removed. AVS/discharge instructions put in AVS packet for [facility]. She is being discharged via [facility] to [facility] with all of her belongings.

## 2022-02-26 NOTE — Discharge Summary (Signed)
Physician Discharge Summary  Rebecca Barnes FAO:[id number] DOB: 06/04/1946 DOA: 02/22/2022  PCP: Caesar Bookman, NP  Admit date: 02/22/2022 Discharge date: 02/26/2022 Admitted From: SNF Disposition: SNF Recommendations for Outpatient Follow-up:  Follow up with orthopedic surgery as below Please obtain CMP and CBC in 1 week Please follow up on the following pending results: None  Discharge Condition: Stable CODE STATUS: DNR/DNI  Follow-up Information     Swinteck, Arlys John, MD. Schedule an appointment as soon as possible for a visit in 2 week(s).   Specialty: Orthopedic Surgery Why: For suture removal, For wound re-check Contact information: [address] [place] [address] [phone]                 Hospital course 76 year old F with PMH of CVA with residual aphasia, DM-2, HTN, anemia, GIB and recent right THA on 02/02/2022 returning from SNF with right hip pain after accidental fall out of her bed.  She was admitted for periprosthetic hip fracture, ABLA in the setting of right hip postsurgical hematoma and agitation.  Orthopedic surgery consulted.   Patient underwent revision femoral component of right THA by Dr. Linna Caprice on 02/24/2022.  EBL about 1200 cc.  She was transfused about 3 units.  H&H remained stable after blood transfusion.  She was started on IV ceftriaxone for some inflammatory tissue irritations around surgical site.  She is discharged on p.o. cefadroxil for 7 more days.  She will be 50% WB on RLE with a walker and posterior hip precautions.  She will be on Plavix alone for VTE prophylaxis.   Outpatient follow-up with orthopedic surgery in 2 weeks.  See individual problem list below for more.   Problems addressed during this hospitalization  Fall at nursing home Periprosthetic hip fracture Recent right total hip arthroplasty -7/5-revision femoral component of right THA by Dr. Linna Caprice -Started on IV ceftriaxone postoperatively and  discharged on p.o. cefadroxil for 7 more days -50% weightbearing on RLE with walker and posterior hip precaution -Pain control per orthopedic surgery -Plavix for DVT prophylaxis -Continue PT/OT -Outpatient follow-up with orthopedic surgery as above.   Acute postoperative blood loss anemia: CT showed large hematoma.  EBL 1200 cc.  H&H stable after 5 units.  Iron deficiency anemia/vitamin B12 deficiency Recent Labs    02/22/22 2337 02/22/22 2347 02/23/22 0445 02/23/22 1100 02/23/22 2316 02/24/22 0917 02/24/22 2031 02/24/22 2345 02/25/22 0329 02/26/22 0347  HGB 7.4* 7.8* 7.1* 6.9* 8.7* 9.3* 10.2* 10.5* 9.7*  9.8* 9.7*  -Baseline Hgb about 13. -CT showed right hip fluid collection measuring 10.5 x 5.4 cm on 6/25>>> 16 x 6 x 12 cm on 7/4. S/p evacuation. -Received IV ferric gluconate and B12 injection on 7/4. -Recheck CBC in 1 week   History of CVA with residual aphasia: On DAPT prior to hospitalization. -Discontinued aspirin due to risk of bleeding -Continue Plavix   Controlled NIDDM-2 with hyperglycemia: A1c 6.3%.  On metformin at home. -Continue home meds   Essential hypertension: Normotensive off home losartan.. -Discontinue home losartan -Continue amlodipine   Anxiety/agitation/mood disorder: Seems to have sundowning.  -Continue home Klonopin and Seroquel -Continue home Prozac.   Leukocytosis/bandemia: Likely demargination.  Improved. -Recheck CBC in 1 week.   Hypokalemia/hypomagnesemia: Resolved.   Morbid obesity with comorbidity as above Body mass index is 35.7 kg/m.   Vital signs Vitals:   02/25/22 2205 02/26/22 0149 02/26/22 0500 02/26/22 0525  BP: 137/73 (!) 105/47  (!) 132/50  Pulse: (!) 114 91  93  Temp: 98.6  F (37 C) 98.1 F (36.7 C)  98.2 F (36.8 C)  Resp: 18 18  18   Height:      Weight:   97.3 kg   SpO2: 95% 94%  91%  TempSrc: Oral Oral  Oral  BMI (Calculated):   35.7      Discharge exam  GENERAL: No apparent distress.   Nontoxic. HEENT: MMM.  Vision and hearing grossly intact.  NECK: Supple.  No apparent JVD.  RESP:  No IWOB.  Fair aeration bilaterally. CVS:  RRR. Heart sounds normal.  ABD/GI/GU: BS+. Abd soft, NTND.  MSK/EXT:  Moves extremities.  Steri-Strips and dressing over right thigh DCI.  Slight swelling in right eye. SKIN: As above. NEURO: Awake and alert. Oriented to self.  Follows command.  Expressive aphasia.  No apparent focal neuro deficit. PSYCH: Calm. Normal affect.   Discharge Instructions Discharge Instructions     Diet general   Complete by: As directed    Increase activity slowly   Complete by: As directed    No wound care   Complete by: As directed       Allergies as of 02/26/2022       Reactions   Januvia [sitagliptin] Other (See Comments)   dizziness   Statins Other (See Comments)   Hospitalized on 2 occasions after trying statins. Patient became dizzy and increased fall risk   Sulfa Antibiotics Other (See Comments)   Childhood Allergy  Unknown reaction        Medication List     STOP taking these medications    aspirin 81 MG chewable tablet   bisacodyl 5 MG EC tablet Commonly known as: DULCOLAX   losartan 100 MG tablet Commonly known as: COZAAR       TAKE these medications    acetaminophen 325 MG tablet Commonly known as: TYLENOL Take 650 mg by mouth every 6 (six) hours as needed for mild pain or headache.   amLODipine 5 MG tablet Commonly known as: NORVASC TAKE 1 TABLET(5 MG) BY MOUTH DAILY What changed: See the new instructions.   camphor-menthol lotion Commonly known as: SARNA Apply topically as needed for itching. What changed:  how much to take when to take this additional instructions   cefadroxil 500 MG capsule Commonly known as: DURICEF Take 2 capsules (1,000 mg total) by mouth 2 (two) times daily for 7 days.   clonazePAM 0.5 MG tablet Commonly known as: KLONOPIN Take 1 tablet (0.5 mg total) by mouth daily as needed for  anxiety.   clopidogrel 75 MG tablet Commonly known as: PLAVIX Take 1 tablet (75 mg total) by mouth daily.   cyanocobalamin 1000 MCG tablet Take 1 tablet (1,000 mcg total) by mouth daily. Start taking on: February 27, 2022   docusate sodium 100 MG capsule Commonly known as: COLACE Take 1 capsule (100 mg total) by mouth 2 (two) times daily.   fenofibrate 54 MG tablet TAKE 1 TABLET(54 MG) BY MOUTH DAILY What changed: See the new instructions.   FLUoxetine 40 MG capsule Commonly known as: PROZAC Take one capsule by mouth once daily. What changed:  how much to take how to take this when to take this additional instructions   hydrOXYzine 10 MG tablet Commonly known as: ATARAX TAKE 1 TABLET(10 MG) BY MOUTH EVERY 8 HOURS AS NEEDED FOR ANXIETY What changed: See the new instructions.   Melatonin 10 MG Tabs Take 10 mg by mouth at bedtime.   metFORMIN 500 MG tablet Commonly known as: GLUCOPHAGE TAKE  1 TABLET(500 MG) BY MOUTH DAILY WITH BREAKFAST What changed: See the new instructions.   naloxone 0.4 MG/ML injection Commonly known as: NARCAN Inject 1 mL (0.4 mg total) into the vein as needed.   NUTRITIONAL SUPPLEMENT PO Take 1 Dose by mouth 2 (two) times daily. Med Pass 2.0   ondansetron 4 MG tablet Commonly known as: ZOFRAN Take 1 tablet (4 mg total) by mouth every 6 (six) hours as needed for nausea.   oxyCODONE 5 MG immediate release tablet Commonly known as: Roxicodone Take 1 tablet (5 mg total) by mouth every 4 (four) hours as needed for up to 7 days for severe pain. What changed: reasons to take this   oxymetazoline 0.05 % nasal spray Commonly known as: Afrin Nasal Spray Spray 1 spray into affected nostril if bleeding is uncontrolled after 20 min of direct pressure What changed:  how much to take how to take this when to take this reasons to take this additional instructions   QUEtiapine 25 MG tablet Commonly known as: SEROQUEL Take 1 tablet (25 mg total) by  mouth at bedtime as needed (agitation/sundowning).   senna-docusate 8.6-50 MG tablet Commonly known as: Senokot-S Take 1 tablet by mouth 2 (two) times daily as needed for moderate constipation.   sodium chloride 1 g tablet TAKE 1 TABLET(1 GRAM) BY MOUTH THREE TIMES DAILY What changed: See the new instructions.   sodium phosphate 7-19 GM/118ML Enem Place 133 mLs (1 enema total) rectally daily as needed for severe constipation.        Consultations: Orthopedic surgery  Procedures/Studies: 7/5-revision femoral component of right THA by Dr. Annett Gula Hip Port Unilat With Pelvis 1V Right  Result Date: 02/25/2022 CLINICAL DATA:  Status post ORIF of right femoral fracture with prosthesis extension EXAM: DG HIP (WITH OR WITHOUT PELVIS) 1V PORT RIGHT COMPARISON:  Intraoperative films from earlier in the same day. FINDINGS: New right hip prosthesis is noted extending distally into the femur. Multiple cerclage wires are noted with reduction of the previously seen fracture. IMPRESSION: Status post ORIF of right femoral fracture. Electronically Signed   By: Alcide Clever M.D.   On: 02/25/2022 00:06   DG FEMUR, MIN 2 VIEWS RIGHT  Result Date: 02/24/2022 CLINICAL DATA:  Periprosthetic right femoral fracture EXAM: RIGHT FEMUR 2 VIEWS COMPARISON:  Films from the previous day. FLUOROSCOPY TIME:  Radiation Exposure Index (as provided by the fluoroscopic device): 4.76 mGy If the device does not provide the exposure index: Fluoroscopy Time:  21 seconds Number of Acquired Images:  6 FINDINGS: Initial images demonstrate multiple cerclage wires surrounding the femur. The prosthesis is again identified. Cerclage wires within tightened to allow approximation of the fracture fragments. The prosthesis was then extended deeper into the femur. IMPRESSION: ORIF of right periprosthetic femoral fracture. Electronically Signed   By: Alcide Clever M.D.   On: 02/24/2022 22:43   DG C-Arm 1-60 Min-No Report  Result  Date: 02/24/2022 Fluoroscopy was utilized by the requesting physician.  No radiographic interpretation.   DG C-Arm 1-60 Min-No Report  Result Date: 02/24/2022 Fluoroscopy was utilized by the requesting physician.  No radiographic interpretation.   DG C-Arm 1-60 Min-No Report  Result Date: 02/24/2022 Fluoroscopy was utilized by the requesting physician.  No radiographic interpretation.   CT FEMUR RIGHT W CONTRAST  Result Date: 02/23/2022 CLINICAL DATA:  Soft tissue infection suspected, knee, xray done EXAM: CT OF THE LOWER RIGHT EXTREMITY WITH CONTRAST TECHNIQUE: Multidetector CT imaging of the lower right extremity was  performed according to the standard protocol following intravenous contrast administration. RADIATION DOSE REDUCTION: This exam was performed according to the departmental dose-optimization program which includes automated exposure control, adjustment of the mA and/or kV according to patient size and/or use of iterative reconstruction technique. CONTRAST:  OMNIPAQUE IOHEXOL 300 MG/ML  SOLN COMPARISON:  X-ray 02/23/2022 FINDINGS: Bones/Joint/Cartilage Postsurgical changes from recent right total hip arthroplasty. Comminuted periprosthetic fracture of the proximal to mid femoral diaphysis with external rotation and valgus angulation at the fracture site. There is an approximately 17 cm butterfly fragment along the lateral fracture margin, mildly displaced. Femoral component remains appropriately position within the acetabular cup. No dislocation. Advanced tricompartmental osteoarthritis of the knee. Ligaments Suboptimally assessed by CT. Muscles and Tendons Generalized muscle atrophy. Soft tissues Large superficial fluid collection the lateral aspect of the hip and proximal thigh underlying the surgical incision site measuring approximately 16 x 6 x 12 cm (volume = 600 cm^3). Overlying surgical staples. No gas within the collection or adjacent soft tissues. There is skin thickening and  mild induration within the subcutaneous fat at the lateral aspect of the hip, nonspecific. IMPRESSION: 1. Postsurgical changes from recent right total hip arthroplasty with a comminuted periprosthetic fracture of the proximal to mid femoral diaphysis. 2. Large superficial fluid collection underlying the surgical incision site measuring 16 x 6 x 12 cm, which may represent a postoperative seroma, hematoma, or less likely abscess. 3. Skin thickening and mild induration within the subcutaneous fat at the lateral aspect of the hip, nonspecific. Correlate for signs of cellulitis. Electronically Signed   By: Duanne Guess D.O.   On: 02/23/2022 12:55   DG Femur Portable Min 2 Views Right  Result Date: 02/23/2022 CLINICAL DATA:  Recent fall with known femoral fracture EXAM: RIGHT FEMUR PORTABLE 2 VIEW COMPARISON:  Films from the previous day. FINDINGS: Comminuted periprosthetic femoral fracture is noted. Angulation at the fracture site is seen. No other focal abnormality is noted. IMPRESSION: Prosthetic right femoral fracture. Electronically Signed   By: Alcide Clever M.D.   On: 02/23/2022 01:20   CT HEAD WO CONTRAST  Result Date: 02/23/2022 CLINICAL DATA:  Polytrauma, blunt.  Unwitnessed fall. EXAM: CT HEAD WITHOUT CONTRAST CT CERVICAL SPINE WITHOUT CONTRAST TECHNIQUE: Multidetector CT imaging of the head and cervical spine was performed following the standard protocol without intravenous contrast. Multiplanar CT image reconstructions of the cervical spine were also generated. RADIATION DOSE REDUCTION: This exam was performed according to the departmental dose-optimization program which includes automated exposure control, adjustment of the mA and/or kV according to patient size and/or use of iterative reconstruction technique. COMPARISON:  02/01/2022. FINDINGS: CT HEAD FINDINGS Brain: No acute intracranial hemorrhage, midline shift or mass effect. No extra-axial fluid collection. Mild generalized atrophy is noted.  Subcortical and periventricular white matter hypodensities are present bilaterally. There is stable encephalomalacia in the temporal lobe and parieto-occipital region on the left, compatible with old infarct. Senescent calcifications are present in the basal ganglia. Vascular: Atherosclerotic calcification of the carotid siphons. No hyperdense vessel. Skull: Normal. Negative for fracture or focal lesion. Sinuses/Orbits: No acute finding. Other: None. CT CERVICAL SPINE FINDINGS Alignment: Normal. Skull base and vertebrae: No acute fracture. No primary bone lesion or focal pathologic process. Soft tissues and spinal canal: No prevertebral fluid or swelling. No visible canal hematoma. Disc levels: Multilevel intervertebral disc space narrowing, osteophyte formation, and facet arthropathy. Upper chest: Negative. Other: Carotid artery calcifications. IMPRESSION: 1. No acute intracranial process. 2. Atrophy with extensive chronic microvascular ischemic  changes and old left MCA territory infarct. 3. Multilevel degenerative changes in the cervical spine without evidence of acute fracture. Electronically Signed   By: Thornell Sartorius M.D.   On: 02/23/2022 00:26   CT CERVICAL SPINE WO CONTRAST  Result Date: 02/23/2022 CLINICAL DATA:  Polytrauma, blunt.  Unwitnessed fall. EXAM: CT HEAD WITHOUT CONTRAST CT CERVICAL SPINE WITHOUT CONTRAST TECHNIQUE: Multidetector CT imaging of the head and cervical spine was performed following the standard protocol without intravenous contrast. Multiplanar CT image reconstructions of the cervical spine were also generated. RADIATION DOSE REDUCTION: This exam was performed according to the departmental dose-optimization program which includes automated exposure control, adjustment of the mA and/or kV according to patient size and/or use of iterative reconstruction technique. COMPARISON:  02/01/2022. FINDINGS: CT HEAD FINDINGS Brain: No acute intracranial hemorrhage, midline shift or mass effect.  No extra-axial fluid collection. Mild generalized atrophy is noted. Subcortical and periventricular white matter hypodensities are present bilaterally. There is stable encephalomalacia in the temporal lobe and parieto-occipital region on the left, compatible with old infarct. Senescent calcifications are present in the basal ganglia. Vascular: Atherosclerotic calcification of the carotid siphons. No hyperdense vessel. Skull: Normal. Negative for fracture or focal lesion. Sinuses/Orbits: No acute finding. Other: None. CT CERVICAL SPINE FINDINGS Alignment: Normal. Skull base and vertebrae: No acute fracture. No primary bone lesion or focal pathologic process. Soft tissues and spinal canal: No prevertebral fluid or swelling. No visible canal hematoma. Disc levels: Multilevel intervertebral disc space narrowing, osteophyte formation, and facet arthropathy. Upper chest: Negative. Other: Carotid artery calcifications. IMPRESSION: 1. No acute intracranial process. 2. Atrophy with extensive chronic microvascular ischemic changes and old left MCA territory infarct. 3. Multilevel degenerative changes in the cervical spine without evidence of acute fracture. Electronically Signed   By: Thornell Sartorius M.D.   On: 02/23/2022 00:26   DG Hip Port Unilat W or Wo Pelvis 1 View Right  Result Date: 02/23/2022 CLINICAL DATA:  Recent fall with hip pain, initial encounter EXAM: DG HIP (WITH OR WITHOUT PELVIS) 1V PORT RIGHT COMPARISON:  02/02/2022 FINDINGS: Right hip replacement is noted. Cerclage wire is again seen. There is an oblique periprosthetic fracture of the proximal to mid femur identified although incompletely evaluated on this exam. Ring is intact. IMPRESSION: Oblique periprosthetic proximal right femoral fracture. Femur films are recommended for further evaluation. Electronically Signed   By: Alcide Clever M.D.   On: 02/23/2022 00:01   DG Chest Port 1 View  Result Date: 02/23/2022 CLINICAL DATA:  Recent fall with known  femoral fracture, initial encounter EXAM: PORTABLE CHEST 1 VIEW COMPARISON:  02/03/2022 FINDINGS: Cardiac shadow is stable. The lungs are well aerated bilaterally. No focal infiltrate or effusion is seen. No acute bony abnormality is noted. IMPRESSION: No active disease. Electronically Signed   By: Alcide Clever M.D.   On: 02/23/2022 00:00   DG Knee Right Port  Result Date: 02/22/2022 CLINICAL DATA:  And fall with knee pain, initial encounter EXAM: PORTABLE RIGHT KNEE - 2 VIEW COMPARISON:  None Available. FINDINGS: Prior proximal fibular fractures are seen with healing. Degenerative changes of the knee joint are noted. No joint effusion is seen. Fracture in the midshaft of the femur is noted incompletely evaluated on this exam. IMPRESSION: Midshaft right femoral fracture incompletely evaluated on this exam. Degenerative changes of the right knee joint. Electronically Signed   By: Alcide Clever M.D.   On: 02/22/2022 23:59   CT Angio Abd/Pel W and/or Wo Contrast  Result Date: 02/14/2022 CLINICAL  DATA:  GI bleed, hematochezia EXAM: CTA ABDOMEN AND PELVIS WITHOUT AND WITH CONTRAST TECHNIQUE: Multidetector CT imaging of the abdomen and pelvis was performed using the standard protocol during bolus administration of intravenous contrast. Multiplanar reconstructed images and MIPs were obtained and reviewed to evaluate the vascular anatomy. RADIATION DOSE REDUCTION: This exam was performed according to the departmental dose-optimization program which includes automated exposure control, adjustment of the mA and/or kV according to patient size and/or use of iterative reconstruction technique. CONTRAST:  OMNIPAQUE IOHEXOL 350 MG/ML SOLN COMPARISON:  02/14/2022 9:15 a.m. FINDINGS: VASCULAR Normal contour and caliber of the abdominal aorta. No evidence of aneurysm, dissection, or other acute aortic pathology. Duplicated bilateral renal arteries, with otherwise standard branching pattern of the abdominal aorta. Mild  mixed calcific atherosclerosis. Review of the MIP images confirms the above findings. NON-VASCULAR Lower chest: No acute abnormality.  Small hiatal hernia. Hepatobiliary: No focal liver abnormality is seen. Status post cholecystectomy. No biliary dilatation. Pancreas: Unremarkable. No pancreatic ductal dilatation or surrounding inflammatory changes. Spleen: Normal in size without significant abnormality. Adrenals/Urinary Tract: Adrenal glands are unremarkable. Small, benign bilateral renal cortical cysts, including an exophytic 0.8 cm lesion of the inferior pole of the left kidney consistent with a nonenhancing hemorrhagic or proteinaceous cyst. Kidneys are otherwise normal, without renal calculi, solid lesion, or hydronephrosis. Excreted contrast in the renal collecting systems and urinary bladder secondary to previous administration. Bladder is unremarkable. Stomach/Bowel: Stomach is within normal limits. Appendix appears normal. No evidence of bowel wall thickening, distention, or inflammatory changes. Descending and sigmoid diverticulosis without evidence of acute diverticulitis. No intraluminal contrast extravasation on multiphasic imaging. Vascular/Lymphatic: No significant vascular findings are present. No enlarged abdominal or pelvic lymph nodes. Reproductive: Uterine fibroids. Other: No abdominal wall hernia or abnormality. No ascites. Musculoskeletal: Status post right hip arthroplasty. Unchanged, large postoperative fluid collection overlying the right hip anteriorly, measuring 10.5 x 5.4 cm and containing small locules of air (series 7, image 86). IMPRESSION: 1. No intraluminal contrast extravasation within the bowel on multiphasic imaging to localize reported GI bleed. 2. Diverticulosis without acute diverticulitis. 3. Unchanged, large postoperative fluid collection overlying the right hip anteriorly following right hip arthroplasty, measuring 10.5 x 5.4 cm and containing small locules of air. This may  reflect bland seroma, however the presence or absence of infection is not established by CT. 4. Normal contour and caliber of the abdominal aorta. No evidence of aneurysm, dissection, or other acute aortic pathology. Mild mixed calcific atherosclerosis. 5. Small, benign bilateral renal cortical cysts, including a previously queried exophytic 0.8 cm lesion of the inferior pole of the left kidney consistent with a nonenhancing hemorrhagic or proteinaceous cyst. Further follow-up or characterization is required for these benign cysts. Aortic Atherosclerosis (ICD10-I70.0). Electronically Signed   By: Jearld Lesch M.D.   On: 02/14/2022 14:07   CT ABDOMEN PELVIS W CONTRAST  Result Date: 02/14/2022 CLINICAL DATA:  Rectal bleeding.  Abdominal pain. EXAM: CT ABDOMEN AND PELVIS WITH CONTRAST TECHNIQUE: Multidetector CT imaging of the abdomen and pelvis was performed using the standard protocol following bolus administration of intravenous contrast. RADIATION DOSE REDUCTION: This exam was performed according to the departmental dose-optimization program which includes automated exposure control, adjustment of the mA and/or kV according to patient size and/or use of iterative reconstruction technique. CONTRAST:  OMNIPAQUE IOHEXOL 300 MG/ML  SOLN COMPARISON:  None Available. FINDINGS: Lower chest: Subsegmental atelectasis noted in the lung bases. Hepatobiliary: No suspicious focal abnormality within the liver parenchyma. Gallbladder is surgically  absent. No intrahepatic or extrahepatic biliary dilation. Pancreas: No focal mass lesion. No dilatation of the main duct. No intraparenchymal cyst. No peripancreatic edema. Spleen: No splenomegaly. No focal mass lesion. Adrenals/Urinary Tract: No adrenal nodule or mass. Small cyst noted in both kidneys. 8 mm exophytic lesion lower pole left kidney (coronal 58/6) has attenuation too high to be a simple cyst. No evidence for hydroureter. Bladder is nondistended although bladder  wall appears thick and irregular. Stomach/Bowel: Stomach is unremarkable. No gastric wall thickening. No evidence of outlet obstruction. Duodenum is normally positioned as is the ligament of Treitz. No small bowel wall thickening. No small bowel dilatation. The terminal ileum is normal. The appendix is normal. No gross colonic mass. No colonic wall thickening. Diverticular changes are noted in the left colon without evidence of diverticulitis. Mild perirectal and presacral edema evident. Vascular/Lymphatic: There is mild atherosclerotic calcification of the abdominal aorta without aneurysm. There is no gastrohepatic or hepatoduodenal ligament lymphadenopathy. No retroperitoneal or mesenteric lymphadenopathy. No pelvic sidewall lymphadenopathy. Reproductive: Calcified fibroid seen in the uterus. There is no adnexal mass. Other: No intraperitoneal free fluid. Musculoskeletal: 10.2 x 10.6 x 4.7 cm heterogeneous collection of fluid is identified in the subcutaneous tissues anterolateral to the right hip, just deep to the staple line. Multiple gas bubbles are seen within the fluid. There is some fluid and trace gas in the hip musculature. Status post right hip replacement. No worrisome lytic or sclerotic osseous abnormality. IMPRESSION: 1. 10.2 x 10.6 x 4.7 cm heterogeneous collection of fluid in the subcutaneous tissues anterolateral to the right hip, just deep to the staple line. Multiple gas bubbles are seen within the fluid. Patient is status post hip replacement on 02/02/2022 and soft tissue gas is not expected beyond 7-10 days from surgery. Correlation for superinfection of a postoperative seroma or hematoma recommended. 2. No definite findings to account for the patient's rectal bleeding. There is some diverticular disease in the left colon and mild perirectal edema/inflammation is evident. 3. 8 mm exophytic lesion lower pole left kidney has attenuation too high to be a simple cyst. This may be a cyst complicated  by proteinaceous debris or hemorrhage but neoplasm could have this appearance. Consider follow-up MRI abdomen in 3 months to reassess. 4. Aortic Atherosclerosis (ICD10-I70.0). Electronically Signed   By: Kennith Center M.D.   On: 02/14/2022 09:15   DG Chest Port 1 View  Result Date: 02/03/2022 CLINICAL DATA:  Fever, shortness of breath, recent fall EXAM: PORTABLE CHEST 1 VIEW COMPARISON:  02/01/2022 FINDINGS: Transverse diameter of heart is slightly increased. Thoracic aorta is tortuous. There are no signs of pulmonary edema or focal consolidation. There is slight increase in interstitial markings in the lower lung fields. There is no pleural effusion or pneumothorax. IMPRESSION: Cardiomegaly. There is subtle interval increase in markings in the lower lung fields. This may suggest crowding of markings due to poor inspiration or subsegmental atelectasis or contusion. Electronically Signed   By: Ernie Avena M.D.   On: 02/03/2022 16:45   Pelvis Portable  Result Date: 02/02/2022 CLINICAL DATA:  Post right hip replacement EXAM: PORTABLE PELVIS 1-2 VIEWS COMPARISON:  None Available. FINDINGS: Post right hip total arthroplasty with single cerclage wire. Postoperative changes in the soft tissues. No evidence of complication. 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 Electronically Signed   By: Guadlupe Spanish M.D.   On: 02/02/2022 16:15   DG HIP UNILAT WITH PELVIS 2-3 VIEWS RIGHT  Result Date: 02/02/2022 CLINICAL DATA:  Right hip arthroplasty EXAM: DG HIP (WITH OR WITHOUT PELVIS) 2-3V RIGHT COMPARISON:  02/01/2022 FINDINGS: Three fluoroscopic images are obtained during the performance of the procedure and are provided for interpretation only. Images demonstrate right total hip arthroplasty, in near anatomic alignment. No perihardware lucency or fracture. Fluoroscopy time: 19 seconds 2.28 mGy IMPRESSION: Right total hip arthroplasty. Electronically Signed   By: Wiliam Ke  M.D.   On: 02/02/2022 15:15   DG C-Arm 1-60 Min-No Report  Result Date: 02/02/2022 Fluoroscopy was utilized by the requesting physician.  No radiographic interpretation.   DG C-Arm 1-60 Min-No Report  Result Date: 02/02/2022 Fluoroscopy was utilized by the requesting physician.  No radiographic interpretation.   CT HIP RIGHT WO CONTRAST  Result Date: 02/01/2022 CLINICAL DATA:  Hip surgical planning EXAM: CT OF THE RIGHT HIP WITHOUT CONTRAST TECHNIQUE: Multidetector CT imaging of the right hip was performed according to the standard protocol. Multiplanar CT image reconstructions were also generated. RADIATION DOSE REDUCTION: This exam was performed according to the departmental dose-optimization program which includes automated exposure control, adjustment of the mA and/or kV according to patient size and/or use of iterative reconstruction technique. COMPARISON:  Pelvis radiograph 02/01/2022 FINDINGS: Bones/Joint/Cartilage There is a minimally displaced subcapital right femoral neck fracture with longitudinal component extending towards the base of the femoral neck. Small right hip joint effusion. 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 Ligaments Suboptimally assessed by CT. Muscles and Tendons No acute myotendinous abnormality by CT. Soft tissues No focal fluid collection. Pelvic structures Sigmoid diverticulosis.  Normal appendix. IMPRESSION: Minimally displaced subcapital right femoral neck fracture with longitudinal component extending towards the base of the femoral neck. Small joint effusion. 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 Electronically Signed   By: Caprice Renshaw M.D.   On: 02/01/2022 11:31   CT Head Wo Contrast  Result Date: 02/01/2022 CLINICAL DATA:  76 year old female with history of minor head trauma. EXAM: CT HEAD WITHOUT CONTRAST CT CERVICAL SPINE WITHOUT CONTRAST TECHNIQUE: Multidetector CT imaging of the head and cervical spine was performed following the standard protocol  without intravenous contrast. Multiplanar CT image reconstructions of the cervical spine were also generated. RADIATION DOSE REDUCTION: This exam was performed according to the departmental dose-optimization program which includes automated exposure control, adjustment of the mA and/or kV according to patient size and/or use of iterative reconstruction technique. COMPARISON:  Head CT 12/02/2021. Brain MRI 12/03/2021. CT angiography of the neck 12/04/2021. 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 Mild cerebral atrophy. Physiologic calcifications in the basal ganglia incidentally noted. Patchy and confluent areas of decreased attenuation are noted throughout the deep and periventricular white matter of the cerebral hemispheres bilaterally, compatible with chronic microvascular ischemic disease. No evidence of acute infarction, hemorrhage, hydrocephalus, extra-axial collection or mass lesion/mass effect. Vascular: Hyperdense left MCA. Atherosclerotic calcifications in the cerebral vasculature. Skull: Normal. Negative for fracture or focal lesion. Sinuses/Orbits: No acute finding. Other: None. CT CERVICAL SPINE FINDINGS Alignment: Normal. Skull base and vertebrae: No acute fracture. No primary bone lesion or focal pathologic process. Soft tissues and spinal canal: No prevertebral fluid or swelling. No visible canal hematoma. Disc levels: Multilevel degenerative disc disease, most pronounced at C4-C5 and C6-C7. Moderate multilevel facet arthropathy. Upper chest: Unremarkable. Other: None.  IMPRESSION: 1. No definite acute intracranial abnormalities. 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 2. Extensive chronic microvascular ischemic changes in the cerebral white matter, as above. 3. No evidence of significant acute traumatic injury to the cervical spine. 4. Multilevel degenerative disc disease and cervical spondylosis, as above. Electronically Signed   By: Trudie Reed M.D.   On: 02/01/2022 08:07   CT Cervical Spine Wo Contrast  Result Date: 02/01/2022 CLINICAL DATA:  76 year old female with history of minor head trauma. EXAM: CT HEAD WITHOUT CONTRAST CT CERVICAL SPINE WITHOUT CONTRAST TECHNIQUE: Multidetector CT imaging of the head and cervical spine was performed following the standard protocol without intravenous contrast. Multiplanar CT image reconstructions of the cervical spine were also generated. RADIATION DOSE REDUCTION: This exam was performed according to the departmental dose-optimization program which includes automated exposure control, adjustment of the mA and/or kV according to patient size and/or use of iterative reconstruction technique. COMPARISON:  Head CT 12/02/2021. Brain MRI 12/03/2021. CT angiography of the neck 12/04/2021. 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 Mild cerebral atrophy. Physiologic calcifications in the basal ganglia incidentally noted. Patchy and confluent areas of decreased attenuation are noted throughout the deep and periventricular white matter of the cerebral hemispheres bilaterally, compatible with chronic microvascular ischemic disease. No evidence of acute infarction, hemorrhage, hydrocephalus, extra-axial collection or mass lesion/mass effect. Vascular: Hyperdense  left MCA. Atherosclerotic calcifications in the cerebral vasculature. Skull: Normal. Negative for fracture or focal lesion. Sinuses/Orbits: No acute finding. Other: None. CT CERVICAL SPINE FINDINGS Alignment: Normal. Skull base and vertebrae: No acute fracture. No primary bone lesion or focal pathologic process. Soft tissues and spinal canal: No prevertebral fluid or swelling. No visible canal hematoma. Disc levels: Multilevel degenerative disc disease, most pronounced at C4-C5 and C6-C7. Moderate multilevel facet arthropathy. Upper chest: Unremarkable. Other: None. IMPRESSION: 1. No definite acute intracranial abnormalities. 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 2. Extensive chronic microvascular ischemic changes in the cerebral white matter, as above. 3. No evidence of significant acute traumatic injury to the cervical spine. 4. Multilevel degenerative disc disease and cervical spondylosis, as above. Electronically Signed   By: Trudie Reed M.D.   On: 02/01/2022 08:07   DG FEMUR, MIN 2 VIEWS RIGHT  Result Date: 02/01/2022 CLINICAL DATA:  Fall with right hip pain. EXAM: RIGHT FEMUR 2 VIEWS; CHEST  1 VIEW; PELVIS - 1-2 VIEW COMPARISON:  Portable chest 12/02/2021. No prior pelvic or femur films. FINDINGS: Chest: There is mild cardiomegaly. Aorta is mildly ectatic with atherosclerosis of the transverse segment. Stable mediastinum. No vascular congestion is seen. The lungs hypoexpanded but generally clear with limited view bases. There are multiple overlying monitor wires. Osteopenia.  Chronic healed fracture mid shaft left clavicle. There are moderate degenerative changes of the shoulders. Thoracic spondylosis. AP pelvis, single view: Osteopenia. There is no evidence of pelvic fractures or diastasis. Mild symmetric degenerative arthrosis is noted of the hips SI joints and pubic symphysis. Advanced degenerative change visualized lower lumbar spine. Right femoral series: Osteopenia.  There is an acute right femoral neck fracture but it is difficult to adequately characterize due to positioning. As best as can be seen, this appears to  be an oblique mid to distal femoral neck fracture with mild degree of impaction and otherwise nondisplaced. There is degenerative arthrosis at the hip and moderately the femorotibial joint. There is no erosive arthropathy. There are patchy calcifications in the femoral artery. There is artifact from overlying clothing. IMPRESSION: 1. No evidence of acute chest process. Hypoinflated with mild cardiomegaly. 2. Acute right femoral neck fracture, not well seen but appears to be an oblique mid to distal femoral neck fracture with mild impaction. 3. Osteopenia and degenerative change. 4. Calcific plaques in the femoral artery.  Aortic atherosclerosis. Electronically Signed   By: Almira Bar M.D.   On: 02/01/2022 07:17   DG Chest 1 View  Result Date: 02/01/2022 CLINICAL DATA:  Fall with right hip pain. EXAM: RIGHT FEMUR 2 VIEWS; CHEST  1 VIEW; PELVIS - 1-2 VIEW COMPARISON:  Portable chest 12/02/2021. No prior pelvic or femur films. FINDINGS: Chest: There is mild cardiomegaly. Aorta is mildly ectatic with atherosclerosis of the transverse segment. Stable mediastinum. No vascular congestion is seen. The lungs hypoexpanded but generally clear with limited view bases. There are multiple overlying monitor wires. Osteopenia.  Chronic healed fracture mid shaft left clavicle. There are moderate degenerative changes of the shoulders. Thoracic spondylosis. AP pelvis, single view: Osteopenia. There is no evidence of pelvic fractures or diastasis. Mild symmetric degenerative arthrosis is noted of the hips SI joints and pubic symphysis. Advanced degenerative change visualized lower lumbar spine. Right femoral series: Osteopenia. There is an acute right femoral neck fracture but it is difficult to adequately characterize due to positioning. As best as can be seen, this appears to  be an oblique mid to distal femoral neck fracture with mild degree of impaction and otherwise nondisplaced. There is degenerative arthrosis at the hip and moderately the femorotibial joint. There is no erosive arthropathy. There are patchy calcifications in the femoral artery. There is artifact from overlying clothing. IMPRESSION: 1. No evidence of acute chest process. Hypoinflated with mild cardiomegaly. 2. Acute right femoral neck fracture, not well seen but appears to be an oblique mid to distal femoral neck fracture with mild impaction. 3. Osteopenia and degenerative change. 4. Calcific plaques in the femoral artery.  Aortic atherosclerosis. Electronically Signed   By: Almira Bar M.D.   On: 02/01/2022 07:17   DG Pelvis 1-2 Views  Result Date: 02/01/2022 CLINICAL DATA:  Fall with right hip pain. EXAM: RIGHT FEMUR 2 VIEWS; CHEST  1 VIEW; PELVIS - 1-2 VIEW COMPARISON:  Portable chest 12/02/2021. No prior pelvic or femur films. FINDINGS: Chest: There is mild cardiomegaly. Aorta is mildly ectatic with atherosclerosis of the transverse segment. Stable mediastinum. No vascular congestion is seen. The lungs hypoexpanded but generally clear with limited view bases. There are multiple overlying monitor wires. Osteopenia.  Chronic healed fracture mid shaft left clavicle. There are moderate degenerative changes of the shoulders. Thoracic spondylosis. AP pelvis, single view: Osteopenia. There is no evidence of pelvic fractures or diastasis. Mild symmetric degenerative arthrosis is noted of the hips SI joints and pubic symphysis. Advanced degenerative change visualized lower lumbar spine. Right femoral series: Osteopenia. There is an acute right femoral neck fracture but it is difficult to adequately characterize due to positioning. As best as can be seen, this appears to be an oblique mid to distal femoral neck fracture with mild degree of impaction and otherwise nondisplaced. There is degenerative arthrosis at the  hip and moderately the femorotibial joint. There is no erosive arthropathy. There are patchy calcifications in the  femoral artery. There is artifact from overlying clothing. IMPRESSION: 1. No evidence of acute chest process. Hypoinflated with mild cardiomegaly. 2. Acute right femoral neck fracture, not well seen but appears to be an oblique mid to distal femoral neck fracture with mild impaction. 3. Osteopenia and degenerative change. 4. Calcific plaques in the femoral artery.  Aortic atherosclerosis. Electronically Signed   By: Almira Bar M.D.   On: 02/01/2022 07:17       The results of significant diagnostics from this hospitalization (including imaging, microbiology, ancillary and laboratory) are listed below for reference.     Microbiology: Recent Results (from the past 240 hour(s))  Resp Panel by RT-PCR (Flu A&B, Covid) Anterior Nasal Swab     Status: None   Collection Time: 02/23/22  1:39 AM   Specimen: Anterior Nasal Swab  Result Value Ref Range Status   SARS Coronavirus 2 by RT PCR NEGATIVE NEGATIVE Final    Comment: (NOTE) SARS-CoV-2 target nucleic acids are NOT DETECTED.  The SARS-CoV-2 RNA is generally detectable in upper respiratory specimens during the acute phase of infection. The lowest concentration of SARS-CoV-2 viral copies this assay can detect is 138 copies/mL. A negative result does not preclude SARS-Cov-2 infection and should not be used as the sole basis for treatment or other patient management decisions. A negative result may occur with  improper specimen collection/handling, submission of specimen other than nasopharyngeal swab, presence of viral mutation(s) within the areas targeted by this assay, and inadequate number of viral copies(<138 copies/mL). A negative result must be combined with clinical observations, patient history, and epidemiological information. The expected result is Negative.  Fact Sheet for Patients:   BloggerCourse.com  Fact Sheet for Healthcare Providers:  SeriousBroker.it  This test is no t yet approved or cleared by the [place] FDA and  has been authorized for detection and/or diagnosis of SARS-CoV-2 by FDA under an Emergency Use Authorization (EUA). This EUA will remain  in effect (meaning this test can be used) for the duration of the COVID-19 declaration under Section 564(b)(1) of the Act, 21 U.S.C.section 360bbb-3(b)(1), unless the authorization is terminated  or revoked sooner.       Influenza A by PCR NEGATIVE NEGATIVE Final   Influenza B by PCR NEGATIVE NEGATIVE Final    Comment: (NOTE) The Xpert Xpress SARS-CoV-2/FLU/RSV plus assay is intended as an aid in the diagnosis of influenza from Nasopharyngeal swab specimens and should not be used as a sole basis for treatment. Nasal washings and aspirates are unacceptable for Xpert Xpress SARS-CoV-2/FLU/RSV testing.  Fact Sheet for Patients: BloggerCourse.com  Fact Sheet for Healthcare Providers: SeriousBroker.it  This test is not yet approved or cleared by the [place] FDA and has been authorized for detection and/or diagnosis of SARS-CoV-2 by FDA under an Emergency Use Authorization (EUA). This EUA will remain in effect (meaning this test can be used) for the duration of the COVID-19 declaration under Section 564(b)(1) of the Act, 21 U.S.C. section 360bbb-3(b)(1), unless the authorization is terminated or revoked.  Performed at [facility], [address] [place], [place] [address]   Culture, blood (Routine X 2) w Reflex to ID Panel     Status: None (Preliminary result)   Collection Time: 02/23/22 11:01 AM   Specimen: BLOOD RIGHT HAND  Result Value Ref Range Status   Specimen Description BLOOD RIGHT HAND  Final   Special Requests   Final    BOTTLES DRAWN AEROBIC AND ANAEROBIC Blood Culture  results may not  be optimal due to an inadequate volume of blood received in culture bottles   Culture   Final    NO GROWTH 3 DAYS Performed at [facility], [address], [place], [place] [address]    Report Status PENDING  Incomplete  Culture, blood (Routine X 2) w Reflex to ID Panel     Status: None (Preliminary result)   Collection Time: 02/23/22  4:19 PM   Specimen: BLOOD  Result Value Ref Range Status   Specimen Description   Final    BLOOD RIGHT ANTECUBITAL Performed at [facility], [address], [place], [place] [address]    Special Requests   Final    BOTTLES DRAWN AEROBIC ONLY Blood Culture adequate volume Performed at [facility], [address], [place], [place] [address]    Culture   Final    NO GROWTH 3 DAYS Performed at [facility], [address], [place], [place] [address]    Report Status PENDING  Incomplete  MRSA Next Gen by PCR, Nasal     Status: None   Collection Time: 02/23/22  5:29 PM   Specimen: Nasal Mucosa; Nasal Swab  Result Value Ref Range Status   MRSA by PCR Next Gen NOT DETECTED NOT DETECTED Final    Comment: (NOTE) The GeneXpert MRSA Assay (FDA approved for NASAL specimens only), is one component of a comprehensive MRSA colonization surveillance program. It is not intended to diagnose MRSA infection nor to guide or monitor treatment for MRSA infections. Test performance is not FDA approved in patients less than 74 years old. Performed at [facility], [address], [place], [place] [address]      Labs:  CBC: Recent Labs  Lab 02/23/22 0445 02/23/22 1100 02/23/22 2316 02/24/22 0917 02/24/22 2031 02/24/22 2345 02/25/22 0329 02/26/22 0347  WBC 9.3 7.9  --  10.4  --  17.9* 15.0*  14.9* 13.5*  NEUTROABS 6.5  --   --   --   --   --   --   --   HGB 7.1* 6.9*   < > 9.3* 10.2* 10.5* 9.7*  9.8* 9.7*  HCT 23.5* 22.3*   < > 29.7* 30.0* 32.3* 30.2*  29.8* 29.9*   MCV 82.2 81.4  --  82.7  --  82.0 81.4  81.6 81.3  PLT 591* 574*  --  544*  --  413* 395  401* 336   < > = values in this interval not displayed.   BMP &GFR Recent Labs  Lab 02/22/22 2337 02/22/22 2347 02/23/22 0445 02/24/22 0917 02/24/22 2031 02/25/22 0329 02/26/22 0347  NA 138   < > 141 138 136 136 137  K 3.4*   < > 3.8 4.2 3.5 3.7 3.7  CL 107   < > 104 107 100 104 105  CO2 22  --  25 24  --  24 25  GLUCOSE 164*   < > 150* 147* 136* 180* 132*  BUN 13   < > 12 7* 4* 8 8  CREATININE 0.64   < > 0.66 0.53 0.30* 0.42*  0.46 0.45  CALCIUM 8.8*  --  9.2 8.8*  --  8.0* 8.6*  MG  --   --  1.7 1.7  --  1.5* 1.7  PHOS  --   --   --  2.8  --  3.6 2.5   < > = values in this interval not displayed.  Estimated Creatinine Clearance: 70.1 mL/min (by C-G formula based on SCr of 0.45 mg/dL). Liver & Pancreas: Recent Labs  Lab 02/22/22 2337 02/23/22 0445 02/24/22 0917 02/25/22 0329 02/26/22 0347  AST 18 17  --   --   --   ALT 11 11  --   --   --   ALKPHOS 89 91  --   --   --   BILITOT 0.4 0.5  --   --   --   PROT 5.9* 6.2*  --   --   --   ALBUMIN 2.7* 2.7* 3.0* 2.6* 2.5*   No results for input(s): "LIPASE", "AMYLASE" in the last 168 hours. No results for input(s): "AMMONIA" in the last 168 hours. Diabetic: No results for input(s): "HGBA1C" in the last 72 hours. Recent Labs  Lab 02/25/22 1559 02/25/22 1954 02/26/22 0028 02/26/22 0351 02/26/22 0744  GLUCAP 181* 167* 158* 123* 122*   Cardiac Enzymes: Recent Labs  Lab 02/23/22 1100  CKTOTAL 127   No results for input(s): "PROBNP" in the last 8760 hours. Coagulation Profile: Recent Labs  Lab 02/22/22 2337  INR 1.2   Thyroid Function Tests: No results for input(s): "TSH", "T4TOTAL", "FREET4", "T3FREE", "THYROIDAB" in the last 72 hours. Lipid Profile: No results for input(s): "CHOL", "HDL", "LDLCALC", "TRIG", "CHOLHDL", "LDLDIRECT" in the last 72 hours. Anemia Panel: No results for input(s): "VITAMINB12",  "FOLATE", "FERRITIN", "TIBC", "IRON", "RETICCTPCT" in the last 72 hours. Urine analysis:    Component Value Date/Time   COLORURINE AMBER (A) 02/25/2022 0112   APPEARANCEUR CLOUDY (A) 02/25/2022 0112   LABSPEC 1.029 02/25/2022 0112   PHURINE 5.0 02/25/2022 0112   GLUCOSEU 50 (A) 02/25/2022 0112   HGBUR SMALL (A) 02/25/2022 0112   BILIRUBINUR NEGATIVE 02/25/2022 0112   KETONESUR NEGATIVE 02/25/2022 0112   PROTEINUR 30 (A) 02/25/2022 0112   NITRITE NEGATIVE 02/25/2022 0112   LEUKOCYTESUR LARGE (A) 02/25/2022 0112   Sepsis Labs: Invalid input(s): "PROCALCITONIN", "LACTICIDVEN"   SIGNED:  Almon Hercules, MD  [facility] 02/26/2022, 11:26 AM

## 2022-02-26 NOTE — Progress Notes (Signed)
Just attempted to call report to [facility]. No response. Left a voicemail with a callback number.

## 2022-02-26 NOTE — [place] FL2 (Signed)
[place] MEDICAID FL2 LEVEL OF CARE SCREENING TOOL     IDENTIFICATION  Patient Name: Rebecca Barnes Birthdate: Oct 11, 1945 Sex: female Admission Date (Current Location): 02/22/2022  [facility] and [place] Number:  Producer, television/film/video and Address:  [facility],  [address] [place] [address]      Provider Number: [id number]  Attending Physician Name and Address:  Almon Hercules, MD  Relative Name and Phone Number:       Current Level of Care: Hospital Recommended Level of Care: Skilled Nursing Facility Prior Approval Number:    Date Approved/Denied:   PASRR Number: [id number]  Discharge Plan: SNF    Current Diagnoses: Patient Active Problem List   Diagnosis Date Noted   Bandemia 02/25/2022   Hypomagnesemia 02/25/2022   Morbid obesity (HCC) 02/25/2022   Periprosthetic hip fracture 02/23/2022   Fall at nursing home 02/23/2022   GIB (gastrointestinal bleeding) 02/14/2022   Hematoma of right hip 02/14/2022   Normocytic anemia 02/14/2022   Agitation 02/06/2022   Hip fracture (HCC) 02/01/2022   DNR (do not resuscitate) 02/01/2022   Acute cerebral infarction (HCC) 12/06/2021   Aphasia    HLD (hyperlipidemia)    Expressive aphasia 12/03/2021   Obesity (BMI 30-39.9) 12/03/2021   History of marijuana use 12/03/2021   Stroke-like symptoms 12/02/2021   Candidiasis of female genitalia 03/20/2021   History of CVA (cerebrovascular accident) 03/20/2021   Hyponatremia 03/14/2021   Hypokalemia 03/14/2021   Thrombocytosis 03/14/2021   Polycythemia 03/14/2021   Late effect of CVA (RLE weakness) 03/14/2021   Essential hypertension 09/18/2019   Anxiety with depression 06/07/2019   Type 2 diabetes mellitus without complication, without long-term current use of insulin (HCC) 06/07/2019   Disturbance of skin sensation 06/07/2019   Leukocytosis 06/07/2019   Secondary DM with peripheral vascular disease (HCC) 06/07/2019   Generalized weakness 06/07/2019   Anxiety  03/29/2007    Orientation RESPIRATION BLADDER Height & Weight     Self  Normal Incontinent Weight: 214 lb 8.1 oz (97.3 kg) Height:  5\' 5"  (165.1 cm)  BEHAVIORAL SYMPTOMS/MOOD NEUROLOGICAL BOWEL NUTRITION STATUS      Incontinent    AMBULATORY STATUS COMMUNICATION OF NEEDS Skin   Total Care Verbally Other (Comment) (surgical incision only)                       Personal Care Assistance Level of Assistance  Bathing, Feeding, Dressing Bathing Assistance: Maximum assistance Feeding assistance: Limited assistance Dressing Assistance: Maximum assistance     Functional Limitations Info             SPECIAL CARE FACTORS FREQUENCY  PT (By licensed PT), OT (By licensed OT)     PT Frequency: 5x/wk OT Frequency: 5x/wk            Contractures Contractures Info: Not present    Additional Factors Info  Code Status, Allergies Code Status Info: DNR Allergies Info: Januvia (Sitagliptin), Statins, Sulfa Antibiotics           Current Medications (02/26/2022):  This is the current hospital active medication list Current Facility-Administered Medications  Medication Dose Route Frequency Provider Last Rate Last Admin   0.9 %  sodium chloride infusion   Intravenous Continuous 04/29/2022, MD 75 mL/hr at 02/25/22 2335 New Bag at 02/25/22 2335   acetaminophen (TYLENOL) tablet 325-650 mg  325-650 mg Oral Q6H PRN 2336, MD   650 mg at 02/26/22 0954   alum & mag hydroxide-simeth (MAALOX/MYLANTA) 200-200-20  MG/5ML suspension 30 mL  30 mL Oral Q4H PRN Swinteck, Arlys John, MD       cefTRIAXone (ROCEPHIN) 2 g in sodium chloride 0.9 % 100 mL IVPB  2 g Intravenous Q24H Swinteck, Arlys John, MD 200 mL/hr at 02/26/22 0037 2 g at 02/26/22 0037   clonazePAM (KLONOPIN) tablet 0.5 mg  0.5 mg Oral BID Samson Frederic, MD   0.5 mg at 02/26/22 0388   clopidogrel (PLAVIX) tablet 75 mg  75 mg Oral Daily Candelaria Stagers T, MD   75 mg at 02/26/22 0954   diphenhydrAMINE (BENADRYL) 12.5 MG/5ML elixir  12.5-25 mg  12.5-25 mg Oral Q4H PRN Swinteck, Arlys John, MD       docusate sodium (COLACE) capsule 100 mg  100 mg Oral BID Samson Frederic, MD   100 mg at 02/26/22 0955   fenofibrate tablet 54 mg  54 mg Oral q morning Samson Frederic, MD   54 mg at 02/26/22 0954   FLUoxetine (PROZAC) capsule 40 mg  40 mg Oral Daily Samson Frederic, MD   40 mg at 02/26/22 0954   haloperidol lactate (HALDOL) injection 2 mg  2 mg Intravenous Q6H PRN Samson Frederic, MD   2 mg at 02/24/22 1426   HYDROcodone-acetaminophen (NORCO) 7.5-325 MG per tablet 1-2 tablet  1-2 tablet Oral Q4H PRN Samson Frederic, MD   2 tablet at 02/25/22 1630   HYDROcodone-acetaminophen (NORCO/VICODIN) 5-325 MG per tablet 1-2 tablet  1-2 tablet Oral Q4H PRN Samson Frederic, MD   1 tablet at 02/25/22 8280   hydrOXYzine (ATARAX) tablet 25 mg  25 mg Oral TID PRN Samson Frederic, MD   25 mg at 02/25/22 1512   insulin aspart (novoLOG) injection 0-6 Units  0-6 Units Subcutaneous Q4H Samson Frederic, MD   1 Units at 02/26/22 0034   magnesium sulfate IVPB 2 g 50 mL  2 g Intravenous Once Candelaria Stagers T, MD 50 mL/hr at 02/26/22 1003 2 g at 02/26/22 1003   melatonin tablet 10 mg  10 mg Oral QHS PRN Swinteck, Arlys John, MD       menthol-cetylpyridinium (CEPACOL) lozenge 3 mg  1 lozenge Oral PRN Samson Frederic, MD       Or   phenol (CHLORASEPTIC) mouth spray 1 spray  1 spray Mouth/Throat PRN Swinteck, Arlys John, MD       methocarbamol (ROBAXIN) tablet 500 mg  500 mg Oral Q6H PRN Samson Frederic, MD   500 mg at 02/25/22 1630   Or   methocarbamol (ROBAXIN) 500 mg in dextrose 5 % 50 mL IVPB  500 mg Intravenous Q6H PRN Swinteck, Arlys John, MD       metoCLOPramide (REGLAN) tablet 5-10 mg  5-10 mg Oral Q8H PRN Swinteck, Arlys John, MD       Or   metoCLOPramide (REGLAN) injection 5-10 mg  5-10 mg Intravenous Q8H PRN Swinteck, Arlys John, MD       morphine (PF) 2 MG/ML injection 0.5-1 mg  0.5-1 mg Intravenous Q2H PRN Swinteck, Arlys John, MD   1 mg at 02/25/22 0655   naloxone (NARCAN)  injection 0.4 mg  0.4 mg Intravenous PRN Samson Frederic, MD       ondansetron [facility]) tablet 4 mg  4 mg Oral Q6H PRN Swinteck, Arlys John, MD       Or   ondansetron (ZOFRAN) injection 4 mg  4 mg Intravenous Q6H PRN Swinteck, Arlys John, MD       polyethylene glycol (MIRALAX / GLYCOLAX) packet 17 g  17 g Oral Daily PRN Samson Frederic, MD   [phone]  g at 02/26/22 0959   QUEtiapine (SEROQUEL) tablet 25 mg  25 mg Oral QHS Candelaria Stagers T, MD   25 mg at 02/25/22 2054   senna (SENOKOT) tablet 17.2 mg  2 tablet Oral BID Candelaria Stagers T, MD   17.2 mg at 02/26/22 0955   senna-docusate (Senokot-S) tablet 1 tablet  1 tablet Oral BID PRN Almon Hercules, MD       vitamin B-12 (CYANOCOBALAMIN) tablet 1,000 mcg  1,000 mcg Oral Daily Samson Frederic, MD   1,000 mcg at 02/26/22 1660     Discharge Medications: Please see discharge summary for a list of discharge medications.  Relevant Imaging Results:  Relevant Lab Results:   Additional Information SS# [id number]  Amada Jupiter, LCSW

## 2022-02-26 NOTE — Progress Notes (Signed)
  Subjective:  Patient is a poor historian due to aphasia limiting subjective and physical exam. Patient is able to say yes and no, unsure of accuracy or baseline. She is grabbing at her leg some today as if pain. She does not appear to be in distress and was sleeping upon entering the room this morning. Overnight it was reported that she was pulling out her IV's and at her bandages. She required haldol overnight. She seems okay this morning and calm.     Objective:   VITALS:   Vitals:   02/25/22 2205 02/26/22 0149 02/26/22 0500 02/26/22 0525  BP: 137/73 (!) 105/47  (!) 132/50  Pulse: (!) 114 91  93  Resp: 18 18  18  Temp: 98.6 F (37 C) 98.1 F (36.7 C)  98.2 F (36.8 C)  TempSrc: Oral Oral  Oral  SpO2: 95% 94%  91%  Weight:   97.3 kg   Height:        Patient sleeping upon entering room this morning. Patient was woken up for exam. NAD currently. Patient is lying in bed with a pillow under her operative side. She does not appear to be in distress. See is fatigued on exam today. She can follow commands during examination. Exam limited due to aphasia.  Abd soft.  Neurovascular intact Unable to fully assess sensory function due to aphasia.  Intact pulses distally.  Compartment soft.  She has bruising over right hip and buttock area. She has mild to moderate swelling in right LE.   She is able to plantar flex and dorsflex her foot slightly.    Steri strips over anterior incision from 02/02/22.  Aquacel dressing over posterior incision. C/D/I.   Lab Results  Component Value Date   WBC 13.5 (H) 02/26/2022   HGB 9.7 (L) 02/26/2022   HCT 29.9 (L) 02/26/2022   MCV 81.3 02/26/2022   PLT 336 02/26/2022   BMET    Component Value Date/Time   NA 137 02/26/2022 0347   K 3.7 02/26/2022 0347   CL 105 02/26/2022 0347   CO2 25 02/26/2022 0347   GLUCOSE 132 (H) 02/26/2022 0347   BUN 8 02/26/2022 0347   CREATININE 0.45 02/26/2022 0347   CREATININE 0.52 (L) 12/02/2021 1103    CALCIUM 8.6 (L) 02/26/2022 0347   EGFR 97 12/02/2021 1103   GFRNONAA >60 02/26/2022 0347     Assessment/Plan: 2 Days Post-Op   Principal Problem:   Periprosthetic hip fracture Active Problems:   Anxiety   Anxiety with depression   Type 2 diabetes mellitus without complication, without long-term current use of insulin (HCC)   Leukocytosis   Essential hypertension   Hypokalemia   History of CVA (cerebrovascular accident)   Expressive aphasia   HLD (hyperlipidemia)   DNR (do not resuscitate)   Normocytic anemia   Fall at nursing home   Bandemia   Hypomagnesemia   Morbid obesity (HCC)   50% weight bearing with walker DVT ppx: Patient is on plavix at baseline. Hold other DVT ppx due to recent GI bleed. , SCDs, TEDS PO pain control PT/OT Dispo: Disposition per medical team. Likely d/c to SNF per notes. Hold chemical DVT ppx due to recent GI bleed. Continue plavix. Continue IV ceftriaxone during hospital stay due to irritation over previous incision. Pain medication printed in chart.     S , PA-C 02/26/2022, 8:10 AM   [facility]  [place] [address], [place], [place] [address] Phone: [phone] [email or web address] Facebook    Instagram  LinkedIn  Twitter      

## 2022-02-26 NOTE — TOC Transition Note (Signed)
Transition of Care [facility]) - CM/SW Discharge Note   Patient Details  Name: Rebecca Barnes MRN: [id number] Date of Birth: [birth year]  Transition of Care [facility]) CM/SW Contact:  Amada Jupiter, LCSW Phone Number: 02/26/2022, 12:10 PM   Clinical Narrative:    Pt is medically cleared for dc today and [name] has bed ready for readmission today.  Daughter, [name], aware and agreeable.  PTAR called at 12:05pm.  RN to call report to [phone].  No further TOC needs.   Final next level of care: Skilled Nursing Facility Barriers to Discharge: Barriers Resolved   Patient Goals and CMS Choice Patient states their goals for this hospitalization and ongoing recovery are:: rehab      Discharge Placement   Existing PASRR number confirmed : 02/26/22          Patient chooses bed at: [name] Patient to be transferred to facility by: PTAR Name of family member notified: daughter, [name] Patient and family notified of of transfer: 02/26/22  Discharge Plan and Services                DME Arranged: N/A DME Agency: NA                  Social Determinants of Health (SDOH) Interventions     Readmission Risk Interventions    02/26/2022   12:08 PM  Readmission Risk Prevention Plan  Transportation Screening Complete  Medication Review (RN Care Manager) Complete  PCP or Specialist appointment within 3-5 days of discharge Complete  HRI or Home Care Consult Complete  SW Recovery Care/Counseling Consult Complete  Palliative Care Screening Not Applicable  Skilled Nursing Facility Complete

## 2022-02-26 NOTE — Evaluation (Signed)
Occupational Therapy Evaluation Patient Details Name: Rebecca Barnes MRN: [id number] DOB: Dec 11, 1945 Today's Date: 02/26/2022   History of Present Illness 76 y.o. female admitted after unwitnessed fall at facility resulting periprosthetic hip fracture and s/p Revision femoral component right total hip arthroplasty on 02/24/22.   Pt with medical history significant of anxiety; hypertension, diabetes mellitus, CVA.  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  Pt then sustained a fall resulting in right femoral neck fracture, underwent right total hip arthroplasty on 02/02/2022. Pt with recent admission on 02/14/22 from [facility] with rectal bleeding.   Clinical Impression   Patient admitted for above and presents with problem list below, including impaired cognition, pain, decreased activity tolerance, generalized weakness, and impaired vision.  Pt oriented to self only, follows simple commands inconsistently with increased time. She currently requires total assist for limited bed mobility, min-total assist for ADLs. Pt will benefit from continued OT service acutely and after dc at SNF level to decrease burden of care for ADLs and mobility.      Recommendations for follow up therapy are one component of a multi-disciplinary discharge planning process, led by the attending physician.  Recommendations may be updated based on patient status, additional functional criteria and insurance authorization.   Follow Up Recommendations  Skilled nursing-short term rehab (<3 hours/day)    Assistance Recommended at Discharge Frequent or constant Supervision/Assistance  Patient can return home with the following Direct supervision/assist for medications management;Direct supervision/assist for financial management;Help with stairs or  ramp for entrance;Assist for transportation;Two people to help with walking and/or transfers;Two people to help with bathing/dressing/bathroom;Assistance with feeding;Assistance with cooking/housework    Functional Status Assessment  Patient has had a recent decline in their functional status and demonstrates the ability to make significant improvements in function in a reasonable and predictable amount of time.  Equipment Recommendations  Other (comment) (defer)    Recommendations for Other Services Other (comment) (palliative care)     Precautions / Restrictions Precautions Precautions: Fall;Posterior Hip Precaution Booklet Issued: No Precaution Comments: R visual deficits Restrictions Weight Bearing Restrictions: Yes RLE Weight Bearing: Partial weight bearing RLE Partial Weight Bearing Percentage or Pounds: 50      Mobility Bed Mobility Overal bed mobility: Needs Assistance             General bed mobility comments: initally moving L LE towards EOB and assist required with R LE; limited by pain but able to get L LE off EOB.  She requires total assist for long sitting but poor tolerance.    Transfers                          Balance                                           ADL either performed or assessed with clinical judgement   ADL Overall ADL's : Needs assistance/impaired     Grooming: Set up;Wash/dry hands;Wash/dry face;Bed level           Upper Body Dressing : Maximal assistance;Bed level   Lower Body Dressing: Total assistance;+2 for physical assistance;+2 for safety/equipment;Bed level Lower Body Dressing Details (indicate cue  type and reason): would require +2 assist for LB ADls due to pain   Toilet Transfer Details (indicate cue type and reason): defered         Functional mobility during ADLs: Total assistance General ADL Comments: attempted transition to EOB with total assist, pt unable to proceed due to pain.   Returned to supine and attempted chair position, pt only able to tolerate 30* briefly before requesting to decreased HOB (comfortable at 25*)     Vision Ability to See in Adequate Light: 1 Impaired Patient Visual Report: Other (comment) (hx R visual field deficit from CVA) Additional Comments: difficult to assess, pt voicing "I can't see".  But able to locate therapist on L side but not R. Anticipate baseline.     Perception     Praxis      Pertinent Vitals/Pain Pain Assessment Pain Assessment: Faces Faces Pain Scale: Hurts whole lot Pain Location: right hip with movement Pain Descriptors / Indicators: Grimacing, Guarding, Moaning Pain Intervention(s): Limited activity within patient's tolerance, Monitored during session, Repositioned     Hand Dominance Right   Extremity/Trunk Assessment Upper Extremity Assessment Upper Extremity Assessment: Generalized weakness;LUE deficits/detail;Difficult to assess due to impaired cognition LUE Deficits / Details: pt reports L hand pain and "feeling different".  Grossly 3-/5 MMT   Lower Extremity Assessment Lower Extremity Assessment: Defer to PT evaluation       Communication Communication Communication: Expressive difficulties   Cognition Arousal/Alertness: Awake/alert Behavior During Therapy: Anxious (labile) Overall Cognitive Status: Difficult to assess Area of Impairment: Problem solving, Following commands, Awareness                       Following Commands: Follows one step commands inconsistently, Follows one step commands with increased time   Awareness: Intellectual Problem Solving: Slow processing, Decreased initiation, Difficulty sequencing, Requires verbal cues, Requires tactile cues General Comments: hx of expressive aphasia, answers yes/no but question accuracy.  intermittent moments of clear speech throughout session.     General Comments  VSS    Exercises     Shoulder Instructions      Home Living  Family/patient expects to be discharged to:: Skilled nursing facility                                        Prior Functioning/Environment Prior Level of Function : Needs assist             Mobility Comments: Prior to initial hip fx: Supervision for transfers due to right visual field deficit, was able to ambulate with RW at DC from CIR ADLs Comments: Prior to inital hip fx: Pt typically Mod I with ADLs. Noted per chart, pt min guard for showering tasks d/t anxiety at CIR DC, completing other ADLs with Supervision at most        OT Problem List: Decreased strength;Decreased activity tolerance;Impaired balance (sitting and/or standing);Decreased cognition;Decreased safety awareness;Decreased knowledge of use of DME or AE;Pain;Impaired vision/perception;Decreased knowledge of precautions      OT Treatment/Interventions: Self-care/ADL training;Therapeutic exercise;Energy conservation;Therapeutic activities;Patient/family education;Balance training    OT Goals(Current goals can be found in the care plan section) Acute Rehab OT Goals Patient Stated Goal: none stated OT Goal Formulation: Patient unable to participate in goal setting Time For Goal Achievement:  Potential to Achieve Goals: Fair  OT Frequency: Min 2X/week    Co-evaluation  AM-PAC OT "6 Clicks" Daily Activity     Outcome Measure Help from another person eating meals?: A Lot Help from another person taking care of personal grooming?: A Lot Help from another person toileting, which includes using toliet, bedpan, or urinal?: Total Help from another person bathing (including washing, rinsing, drying)?: A Lot Help from another person to put on and taking off regular upper body clothing?: A Lot Help from another person to put on and taking off regular lower body clothing?: Total 6 Click Score: 10   End of Session Nurse Communication: Mobility status;Precautions  Activity Tolerance:  Patient limited by pain Patient left: in bed;with call bell/phone within reach;with bed alarm set  OT Visit Diagnosis: Other abnormalities of gait and mobility (R26.89);Muscle weakness (generalized) (M62.81);Pain;Other symptoms and signs involving cognitive function;Cognitive communication deficit (R41.841);Low vision, both eyes (H54.2) Pain - Right/Left: Right Pain - part of body: Hip;Leg                Time: 3354-5625 OT Time Calculation (min): 21 min Charges:  OT General Charges $OT Visit: 1 Visit OT Evaluation $OT Eval Moderate Complexity: 1 Mod  Barry Brunner, OT Acute Rehabilitation Services Office [phone]   Chancy Milroy 02/26/2022, 10:36 AM

## 2022-02-27 ENCOUNTER — Encounter (HOSPITAL_COMMUNITY): Payer: Self-pay | Admitting: Orthopedic Surgery

## 2022-02-28 LAB — CULTURE, BLOOD (ROUTINE X 2)
Culture: NO GROWTH
Culture: NO GROWTH
Special Requests: ADEQUATE

## 2022-03-01 ENCOUNTER — Telehealth: Payer: Self-pay

## 2022-03-01 NOTE — Telephone Encounter (Signed)
Was unable to reach patient left detailed VM to call office and schedule

## 2022-03-03 ENCOUNTER — Emergency Department (HOSPITAL_COMMUNITY): Payer: Medicare Other

## 2022-03-03 ENCOUNTER — Other Ambulatory Visit: Payer: Self-pay

## 2022-03-03 ENCOUNTER — Encounter (HOSPITAL_COMMUNITY): Payer: Self-pay

## 2022-03-03 ENCOUNTER — Inpatient Hospital Stay (HOSPITAL_COMMUNITY)
Admission: EM | Admit: 2022-03-03 | Discharge: 2022-03-08 | DRG: 920 | Disposition: A | Discharge status: Hospice | Payer: Medicare Other | Attending: Internal Medicine | Admitting: Internal Medicine

## 2022-03-03 DIAGNOSIS — Z882 Allergy status to sulfonamides status: Secondary | ICD-10-CM

## 2022-03-03 DIAGNOSIS — Z20822 Contact with and (suspected) exposure to covid-19: Secondary | ICD-10-CM | POA: Diagnosis present

## 2022-03-03 DIAGNOSIS — Z96641 Presence of right artificial hip joint: Secondary | ICD-10-CM | POA: Diagnosis present

## 2022-03-03 DIAGNOSIS — R52 Pain, unspecified: Secondary | ICD-10-CM | POA: Diagnosis present

## 2022-03-03 DIAGNOSIS — W19XXXD Unspecified fall, subsequent encounter: Secondary | ICD-10-CM | POA: Diagnosis present

## 2022-03-03 DIAGNOSIS — T8149XA Infection following a procedure, other surgical site, initial encounter: Secondary | ICD-10-CM | POA: Diagnosis not present

## 2022-03-03 DIAGNOSIS — L0390 Cellulitis, unspecified: Secondary | ICD-10-CM | POA: Diagnosis present

## 2022-03-03 DIAGNOSIS — F0390 Unspecified dementia without behavioral disturbance: Secondary | ICD-10-CM | POA: Diagnosis present

## 2022-03-03 DIAGNOSIS — R4701 Aphasia: Secondary | ICD-10-CM | POA: Diagnosis present

## 2022-03-03 DIAGNOSIS — M96843 Postprocedural seroma of a musculoskeletal structure following other procedure: Secondary | ICD-10-CM

## 2022-03-03 DIAGNOSIS — Z515 Encounter for palliative care: Secondary | ICD-10-CM

## 2022-03-03 DIAGNOSIS — L7634 Postprocedural seroma of skin and subcutaneous tissue following other procedure: Principal | ICD-10-CM | POA: Diagnosis present

## 2022-03-03 DIAGNOSIS — I10 Essential (primary) hypertension: Secondary | ICD-10-CM | POA: Diagnosis present

## 2022-03-03 DIAGNOSIS — Z66 Do not resuscitate: Secondary | ICD-10-CM | POA: Diagnosis present

## 2022-03-03 DIAGNOSIS — M25559 Pain in unspecified hip: Secondary | ICD-10-CM | POA: Diagnosis present

## 2022-03-03 DIAGNOSIS — Z888 Allergy status to other drugs, medicaments and biological substances status: Secondary | ICD-10-CM

## 2022-03-03 DIAGNOSIS — I69320 Aphasia following cerebral infarction: Secondary | ICD-10-CM

## 2022-03-03 DIAGNOSIS — Z6837 Body mass index (BMI) 37.0-37.9, adult: Secondary | ICD-10-CM

## 2022-03-03 DIAGNOSIS — Z7984 Long term (current) use of oral hypoglycemic drugs: Secondary | ICD-10-CM

## 2022-03-03 DIAGNOSIS — E876 Hypokalemia: Secondary | ICD-10-CM | POA: Diagnosis present

## 2022-03-03 DIAGNOSIS — Z8673 Personal history of transient ischemic attack (TIA), and cerebral infarction without residual deficits: Secondary | ICD-10-CM

## 2022-03-03 DIAGNOSIS — Y838 Other surgical procedures as the cause of abnormal reaction of the patient, or of later complication, without mention of misadventure at the time of the procedure: Secondary | ICD-10-CM | POA: Diagnosis present

## 2022-03-03 DIAGNOSIS — D649 Anemia, unspecified: Secondary | ICD-10-CM | POA: Diagnosis present

## 2022-03-03 DIAGNOSIS — M25551 Pain in right hip: Secondary | ICD-10-CM | POA: Diagnosis present

## 2022-03-03 DIAGNOSIS — Z79899 Other long term (current) drug therapy: Secondary | ICD-10-CM

## 2022-03-03 DIAGNOSIS — L0291 Cutaneous abscess, unspecified: Secondary | ICD-10-CM | POA: Diagnosis present

## 2022-03-03 DIAGNOSIS — G8918 Other acute postprocedural pain: Principal | ICD-10-CM

## 2022-03-03 DIAGNOSIS — E119 Type 2 diabetes mellitus without complications: Secondary | ICD-10-CM

## 2022-03-03 DIAGNOSIS — F419 Anxiety disorder, unspecified: Secondary | ICD-10-CM | POA: Diagnosis present

## 2022-03-03 DIAGNOSIS — J45909 Unspecified asthma, uncomplicated: Secondary | ICD-10-CM | POA: Diagnosis present

## 2022-03-03 DIAGNOSIS — M9701XD Periprosthetic fracture around internal prosthetic right hip joint, subsequent encounter: Secondary | ICD-10-CM

## 2022-03-03 LAB — CBC WITH DIFFERENTIAL/PLATELET
Abs Immature Granulocytes: 0.04 10*3/uL (ref 0.00–0.07)
Basophils Absolute: 0 10*3/uL (ref 0.0–0.1)
Basophils Relative: 0 %
Eosinophils Absolute: 0.4 10*3/uL (ref 0.0–0.5)
Eosinophils Relative: 4 %
HCT: 26.7 % — ABNORMAL LOW (ref 36.0–46.0)
Hemoglobin: 8.5 g/dL — ABNORMAL LOW (ref 12.0–15.0)
Immature Granulocytes: 0 %
Lymphocytes Relative: 16 %
Lymphs Abs: 1.5 10*3/uL (ref 0.7–4.0)
MCH: 26.2 pg (ref 26.0–34.0)
MCHC: 31.8 g/dL (ref 30.0–36.0)
MCV: 82.4 fL (ref 80.0–100.0)
Monocytes Absolute: 1.1 10*3/uL — ABNORMAL HIGH (ref 0.1–1.0)
Monocytes Relative: 11 %
Neutro Abs: 6.6 10*3/uL (ref 1.7–7.7)
Neutrophils Relative %: 69 %
Platelets: 502 10*3/uL — ABNORMAL HIGH (ref 150–400)
RBC: 3.24 MIL/uL — ABNORMAL LOW (ref 3.87–5.11)
RDW: 17.3 % — ABNORMAL HIGH (ref 11.5–15.5)
WBC: 9.7 10*3/uL (ref 4.0–10.5)
nRBC: 0 % (ref 0.0–0.2)

## 2022-03-03 LAB — URINALYSIS, ROUTINE W REFLEX MICROSCOPIC
Bilirubin Urine: NEGATIVE
Glucose, UA: NEGATIVE mg/dL
Hgb urine dipstick: NEGATIVE
Ketones, ur: NEGATIVE mg/dL
Nitrite: NEGATIVE
Protein, ur: NEGATIVE mg/dL
Specific Gravity, Urine: 1.024 (ref 1.005–1.030)
pH: 6 (ref 5.0–8.0)

## 2022-03-03 LAB — COMPREHENSIVE METABOLIC PANEL
ALT: 12 U/L (ref 0–44)
AST: 16 U/L (ref 15–41)
Albumin: 2.2 g/dL — ABNORMAL LOW (ref 3.5–5.0)
Alkaline Phosphatase: 73 U/L (ref 38–126)
Anion gap: 9 (ref 5–15)
BUN: 11 mg/dL (ref 8–23)
CO2: 20 mmol/L — ABNORMAL LOW (ref 22–32)
Calcium: 8 mg/dL — ABNORMAL LOW (ref 8.9–10.3)
Chloride: 106 mmol/L (ref 98–111)
Creatinine, Ser: 0.46 mg/dL (ref 0.44–1.00)
GFR, Estimated: >60 mL/min (ref >60)
Glucose, Bld: 189 mg/dL — ABNORMAL HIGH (ref 70–99)
Potassium: 2.8 mmol/L — ABNORMAL LOW (ref 3.5–5.1)
Sodium: 135 mmol/L (ref 135–145)
Total Bilirubin: 0.4 mg/dL (ref 0.3–1.2)
Total Protein: 5.5 g/dL — ABNORMAL LOW (ref 6.5–8.1)

## 2022-03-03 LAB — LACTIC ACID, PLASMA
Lactic Acid, Venous: 3 mmol/L (ref 0.5–1.9)
Lactic Acid, Venous: 1.6 mmol/L (ref 0.5–1.9)

## 2022-03-03 LAB — I-STAT CHEM 8, ED
BUN: 9 mg/dL (ref 8–23)
Calcium, Ion: 1.13 mmol/L — ABNORMAL LOW (ref 1.15–1.40)
Chloride: 101 mmol/L (ref 98–111)
Creatinine, Ser: 0.3 mg/dL — ABNORMAL LOW (ref 0.44–1.00)
Glucose, Bld: 204 mg/dL — ABNORMAL HIGH (ref 70–99)
HCT: 24 % — ABNORMAL LOW (ref 36.0–46.0)
Hemoglobin: 8.2 g/dL — ABNORMAL LOW (ref 12.0–15.0)
Potassium: 3.1 mmol/L — ABNORMAL LOW (ref 3.5–5.1)
Sodium: 136 mmol/L (ref 135–145)
TCO2: 20 mmol/L — ABNORMAL LOW (ref 22–32)

## 2022-03-03 LAB — CBG MONITORING, ED: Glucose-Capillary: 159 mg/dL — ABNORMAL HIGH (ref 70–99)

## 2022-03-03 MED ORDER — OXYCODONE HCL 5 MG PO TABS
5.0000 mg | ORAL_TABLET | ORAL | Status: DC | PRN
  Administered 2022-03-04 – 2022-03-05 (×3): 5 mg via ORAL
  Filled 2022-03-03 (×3): qty 1

## 2022-03-03 MED ORDER — ONDANSETRON HCL 4 MG/2ML IJ SOLN
4.0000 mg | Freq: Once | INTRAMUSCULAR | Status: AC
  Administered 2022-03-03: 4 mg via INTRAVENOUS
  Filled 2022-03-03: qty 2

## 2022-03-03 MED ORDER — QUETIAPINE FUMARATE 25 MG PO TABS: 25.0000 mg | ORAL_TABLET | Freq: Every evening | ORAL | Status: DC | PRN

## 2022-03-03 MED ORDER — SODIUM CHLORIDE 0.9 % IV SOLN
2.0000 g | Freq: Once | INTRAVENOUS | Status: AC
  Administered 2022-03-03: 2 g via INTRAVENOUS
  Filled 2022-03-03: qty 12.5

## 2022-03-03 MED ORDER — MELATONIN 5 MG PO TABS
10.0000 mg | ORAL_TABLET | Freq: Every day | ORAL | Status: DC
Start: 2022-03-03 — End: 2022-03-08
  Administered 2022-03-03 – 2022-03-07 (×5): 10 mg via ORAL
  Filled 2022-03-03 (×5): qty 2

## 2022-03-03 MED ORDER — SODIUM CHLORIDE 1 G PO TABS
1.0000 g | ORAL_TABLET | Freq: Three times a day (TID) | ORAL | Status: DC
  Administered 2022-03-04 – 2022-03-05 (×5): 1 g via ORAL
  Filled 2022-03-03 (×6): qty 1

## 2022-03-03 MED ORDER — VANCOMYCIN HCL IN DEXTROSE 1-5 GM/200ML-% IV SOLN
1000.0000 mg | Freq: Once | INTRAVENOUS | Status: DC
  Filled 2022-03-03: qty 200

## 2022-03-03 MED ORDER — VITAMIN B-12 1000 MCG PO TABS
1000.0000 ug | ORAL_TABLET | Freq: Every day | ORAL | Status: DC
Start: 2022-03-04 — End: 2022-03-05
  Administered 2022-03-04 – 2022-03-05 (×2): 1000 ug via ORAL
  Filled 2022-03-03 (×2): qty 1

## 2022-03-03 MED ORDER — LIDOCAINE 5 % EX PTCH
1.0000 | MEDICATED_PATCH | CUTANEOUS | Status: DC
  Administered 2022-03-03 – 2022-03-07 (×5): 1 via TRANSDERMAL
  Filled 2022-03-03 (×5): qty 1

## 2022-03-03 MED ORDER — DOCUSATE SODIUM 100 MG PO CAPS
100.0000 mg | ORAL_CAPSULE | Freq: Two times a day (BID) | ORAL | Status: DC
  Administered 2022-03-04 – 2022-03-05 (×2): 100 mg via ORAL
  Filled 2022-03-03 (×2): qty 1

## 2022-03-03 MED ORDER — FLUOXETINE HCL 20 MG PO CAPS
40.0000 mg | ORAL_CAPSULE | Freq: Every day | ORAL | Status: DC
  Administered 2022-03-04 – 2022-03-08 (×5): 40 mg via ORAL
  Filled 2022-03-03 (×5): qty 2

## 2022-03-03 MED ORDER — VANCOMYCIN HCL IN DEXTROSE 1-5 GM/200ML-% IV SOLN
1000.0000 mg | Freq: Once | INTRAVENOUS | Status: DC
Start: 2022-03-03 — End: 2022-03-03
  Filled 2022-03-03: qty 200

## 2022-03-03 MED ORDER — INSULIN ASPART 100 UNIT/ML IJ SOLN
0.0000 [IU] | Freq: Three times a day (TID) | INTRAMUSCULAR | Status: DC
  Administered 2022-03-03: 1 [IU] via SUBCUTANEOUS
  Filled 2022-03-03: qty 0.06

## 2022-03-03 MED ORDER — SODIUM CHLORIDE 0.9 % IV SOLN
2.0000 g | Freq: Once | INTRAVENOUS | Status: DC
  Filled 2022-03-03: qty 12.5

## 2022-03-03 MED ORDER — VANCOMYCIN HCL 2000 MG/400ML IV SOLN
2000.0000 mg | Freq: Once | INTRAVENOUS | Status: AC
  Administered 2022-03-03: 2000 mg via INTRAVENOUS
  Filled 2022-03-03: qty 400

## 2022-03-03 MED ORDER — SODIUM CHLORIDE 0.9 % IV SOLN
2.0000 g | Freq: Three times a day (TID) | INTRAVENOUS | Status: DC
  Administered 2022-03-04 (×2): 2 g via INTRAVENOUS
  Filled 2022-03-03 (×2): qty 12.5

## 2022-03-03 MED ORDER — CLONAZEPAM 0.5 MG PO TABS
0.5000 mg | ORAL_TABLET | Freq: Two times a day (BID) | ORAL | Status: DC | PRN
Start: 2022-03-03 — End: 2022-03-04
  Administered 2022-03-04: 0.5 mg via ORAL
  Filled 2022-03-03: qty 1

## 2022-03-03 MED ORDER — LORAZEPAM 2 MG/ML IJ SOLN
1.0000 mg | Freq: Once | INTRAMUSCULAR | Status: AC
  Administered 2022-03-03: 1 mg via INTRAVENOUS
  Filled 2022-03-03: qty 1

## 2022-03-03 MED ORDER — FENOFIBRATE 54 MG PO TABS
54.0000 mg | ORAL_TABLET | Freq: Every day | ORAL | Status: DC
  Administered 2022-03-04 – 2022-03-08 (×5): 54 mg via ORAL
  Filled 2022-03-03 (×5): qty 1

## 2022-03-03 MED ORDER — VANCOMYCIN HCL 1250 MG/250ML IV SOLN: 1250.0000 mg | INTRAVENOUS | Status: DC

## 2022-03-03 MED ORDER — AMLODIPINE BESYLATE 5 MG PO TABS
5.0000 mg | ORAL_TABLET | Freq: Every day | ORAL | Status: DC
Start: 2022-03-04 — End: 2022-03-05
  Administered 2022-03-04 – 2022-03-05 (×2): 5 mg via ORAL
  Filled 2022-03-03 (×2): qty 1

## 2022-03-03 MED ORDER — ACETAMINOPHEN 325 MG PO TABS
650.0000 mg | ORAL_TABLET | Freq: Four times a day (QID) | ORAL | Status: DC | PRN
  Administered 2022-03-05 (×2): 650 mg via ORAL
  Filled 2022-03-03 (×2): qty 2

## 2022-03-03 MED ORDER — IOHEXOL 300 MG/ML  SOLN
100.0000 mL | Freq: Once | INTRAMUSCULAR | Status: AC | PRN
  Administered 2022-03-03: 100 mL via INTRAVENOUS

## 2022-03-03 MED ORDER — POTASSIUM CHLORIDE 10 MEQ/100ML IV SOLN
10.0000 meq | INTRAVENOUS | Status: AC
  Administered 2022-03-03 (×2): 10 meq via INTRAVENOUS
  Filled 2022-03-03 (×2): qty 100

## 2022-03-03 MED ORDER — SODIUM CHLORIDE 0.9 % IV BOLUS
1000.0000 mL | Freq: Once | INTRAVENOUS | Status: AC
  Administered 2022-03-03: 1000 mL via INTRAVENOUS

## 2022-03-03 MED ORDER — HYDROMORPHONE HCL 1 MG/ML IJ SOLN
1.0000 mg | Freq: Once | INTRAMUSCULAR | Status: AC
  Administered 2022-03-03: 1 mg via INTRAVENOUS
  Filled 2022-03-03: qty 1

## 2022-03-03 NOTE — Progress Notes (Signed)
Pharmacy Antibiotic Note  Rebecca Barnes is a 76 y.o. female admitted on 03/03/2022 with history of hypertension, diabetes, recent right periprosthetic fracture status post revision surgery done by Dr. Linna Caprice on 7/5, here presenting with persistent right hip pain.  Pharmacy has been consulted for cefepime and vancomycin dosing for wound infection  Plan: Vancomycin 2gm IV x 1 then 1250mg  IV q24h (AUC 515.1, used Scr 0.8) Cefepime 2gm IV q8h Follow renal function, cultures and clinical course  Height: 5\' 5"  (165.1 cm) Weight: 97.5 kg (214 lb 15.2 oz) IBW/kg (Calculated) : 57  Temp (24hrs), Avg:97.9 F (36.6 C), Min:97.9 F (36.6 C), Max:97.9 F (36.6 C)  Recent Labs  Lab 02/24/22 2345 02/25/22 0329 02/26/22 0347 03/03/22 1544 03/03/22 1546 03/03/22 1607 03/03/22 1840  WBC 17.9* 15.0*  14.9* 13.5*  --  9.7  --   --   CREATININE  --  0.42*  0.46 0.45  --  0.46 0.30*  --   LATICACIDVEN  --   --   --  3.0*  --   --  1.6    Estimated Creatinine Clearance: 70.2 mL/min (A) (by C-G formula based on SCr of 0.3 mg/dL (L)).    Allergies  Allergen Reactions   Januvia [Sitagliptin] Other (See Comments)    dizziness   Statins Other (See Comments)    Hospitalized on 2 occasions after trying statins. Patient became dizzy and increased fall risk   Sulfa Antibiotics Other (See Comments)    Childhood Allergy  Unknown reaction    Antimicrobials this admission: 7/12 vanc >> 7/12 cefepime >>  Dose adjustments this admission:   Microbiology results: 7/12 BCx:    Thank you for allowing pharmacy to be a part of this patient's care.  9/12 RPh 03/03/2022, 10:26 PM

## 2022-03-03 NOTE — ED Triage Notes (Signed)
Pt BIB EMS from [facility] on [address]. Pt broke her right hip last week. Pt confused at baseline, pt yells and screams at baseline per EMS. Pt on antibiotics for a UTI. Pt has hx of stroke with aphasia at baseline.

## 2022-03-03 NOTE — ED Notes (Signed)
Gave report to the floor.

## 2022-03-03 NOTE — H&P (Incomplete)
History and Physical    PatientKensie Barnes WUJ:[id number] DOB: 05-Oct-1945 DOA: 03/03/2022 DOS: the patient was seen and examined on 03/04/2022 PCP: Ngetich, Donalee Citrin, NP  Patient coming from: SNF  Chief Complaint: No chief complaint on file.  HPI: Rebecca Barnes is a 76 y.o. female with medical history significant of dementia, CVA with residual aphasia, type 2 diabetes, hypertension, anemia who presents with worsening pain of right hip.  Patient was just admitted from 02/22/2022-02/26/2022 for right hip periprosthetic hip fracture s/p fall.  She underwent revision of right THA by Dr. Linna Caprice on 02/24/2022.  EBL about 1200cc and was transfused 3 units PRBC with stable hemoglobin afterwards and was started on Plavix alone for VTE prophylaxis.  She was started on IV ceftriaxone for inflammatory tissue irritation around the surgical site and was discharged on a week of p.o. cefadroxil.  Repeat CT femur today showed extensive skin thickening and subcutaneous soft tissue swelling of the thigh likely cellulitis.  There is also large superficial fluid collection along the anterior proximal thigh that could be abscess or postoperative seroma/hematoma. There is also coalescent fluid along the posterior lateral incision and the majority of the IT band with a similar differential as above.    ED physician discussed with orthopedic Dr Odis Hollingshead and given the lack of fever or leukocytosis doubt infection but agree with continuing IV antibiotics and they will see in consultation in the morning.  No definitive plans for intervention at this time. Hemoglobin is stable at 8.5 with previous hemoglobin of 9.7.  Review of Systems: unable to review all systems due to the inability of the patient to answer questions. Past Medical History:  Diagnosis Date   Anxiety    Asthma    Diabetes mellitus without complication (HCC)    Hypertension    Stroke [facility])    Past Surgical History:  Procedure Laterality Date    arm fracture     arm surgery   CHOLECYSTECTOMY     TOTAL HIP ARTHROPLASTY Right 02/02/2022   Procedure: TOTAL HIP ARTHROPLASTY ANTERIOR APPROACH;  Surgeon: Samson Frederic, MD;  Location: [facility] OR;  Service: Orthopedics;  Laterality: Right;   TOTAL HIP REVISION Right 02/24/2022   Procedure: FEMUR COMPONENT REVISION;  Surgeon: Samson Frederic, MD;  Location: [facility] ORS;  Service: Orthopedics;  Laterality: Right;   Social History:  reports that she has never smoked. She has never used smokeless tobacco. She reports that she does not currently use drugs after having used the following drugs: Marijuana. She reports that she does not drink alcohol.  Allergies  Allergen Reactions   Januvia [Sitagliptin] Other (See Comments)    dizziness   Statins Other (See Comments)    Hospitalized on 2 occasions after trying statins. Patient became dizzy and increased fall risk   Sulfa Antibiotics Other (See Comments)    Childhood Allergy  Unknown reaction    Family History  Problem Relation Age of Onset   Cancer Mother     Prior to Admission medications   Medication Sig Start Date End Date Taking? Authorizing Provider  acetaminophen (TYLENOL) 325 MG tablet Take 650 mg by mouth every 6 (six) hours as needed for mild pain or headache. 06/13/20  Yes [provider]  amLODipine (NORVASC) 5 MG tablet TAKE 1 TABLET(5 MG) BY MOUTH DAILY Patient taking differently: Take 5 mg by mouth daily. 08/28/21  Yes Ngetich, Dinah C, NP  camphor-menthol (SARNA) lotion Apply topically as needed for itching. Patient taking differently: Apply 1 application  topically  daily as needed for itching. Camphor - Menthol External Lotion 0.5-0.5% 12/11/21  Yes Setzer, Lynnell Jude, PA-C  cefadroxil (DURICEF) 500 MG capsule Take 2 capsules (1,000 mg total) by mouth 2 (two) times daily for 7 days. 02/26/22 03/05/22 Yes Almon Hercules, MD  clonazePAM (KLONOPIN) 0.5 MG tablet Take 1 tablet (0.5 mg total) by mouth daily as needed for  anxiety. Patient taking differently: Take 0.5 mg by mouth every 12 (twelve) hours as needed for anxiety. 02/15/22 02/15/23 Yes Pahwani, Daleen Bo, MD  clopidogrel (PLAVIX) 75 MG tablet Take 1 tablet (75 mg total) by mouth daily. 02/19/22  Yes Pahwani, Daleen Bo, MD  docusate sodium (COLACE) 100 MG capsule Take 1 capsule (100 mg total) by mouth 2 (two) times daily. 02/09/22  Yes Pokhrel, Laxman, MD  fenofibrate 54 MG tablet TAKE 1 TABLET(54 MG) BY MOUTH DAILY Patient taking differently: Take 54 mg by mouth daily. 01/04/22  Yes Ngetich, Dinah C, NP  FLUoxetine (PROZAC) 40 MG capsule Take one capsule by mouth once daily. Patient taking differently: Take 40 mg by mouth daily. 01/04/22  Yes Ngetich, Dinah C, NP  lidocaine 4 % Place 1 patch onto the skin daily.   Yes [provider]  melatonin 10 MG TABS Take 10 mg by mouth at bedtime. 12/11/21  Yes Setzer, Lynnell Jude, PA-C  metFORMIN (GLUCOPHAGE) 500 MG tablet TAKE 1 TABLET(500 MG) BY MOUTH DAILY WITH BREAKFAST Patient taking differently: Take 500 mg by mouth daily with breakfast. 01/13/22  Yes Ngetich, Dinah C, NP  naloxone (NARCAN) 0.4 MG/ML injection Inject 1 mL (0.4 mg total) into the vein as needed. 02/26/22  Yes Gonfa, Boyce Medici, MD  NUEDEXTA 20-10 MG capsule Take 1 capsule by mouth 2 (two) times daily. X 7 days 03/01/22  Yes [provider]  Nutritional Supplements (NUTRITIONAL SUPPLEMENT PO) Take 1 Dose by mouth 2 (two) times daily. Med Pass 2.0   Yes [provider]  ondansetron (ZOFRAN) 4 MG tablet Take 1 tablet (4 mg total) by mouth every 6 (six) hours as needed for nausea. 02/09/22  Yes Pokhrel, Laxman, MD  oxyCODONE (ROXICODONE) 5 MG immediate release tablet Take 1 tablet (5 mg total) by mouth every 4 (four) hours as needed for up to 7 days for severe pain. 02/25/22 03/04/22 Yes Hill, Alain Honey, PA-C  oxymetazoline (AFRIN NASAL SPRAY) 0.05 % nasal spray Spray 1 spray into affected nostril if bleeding is uncontrolled after 20 min of direct  pressure Patient taking differently: Place 1 spray into both nostrils daily as needed (nose bleed uncontrolled after 20 minutes, apply direct pressure.). 12/18/21  Yes Raynald Blend R, PA-C  senna-docusate (SENOKOT-S) 8.6-50 MG tablet Take 1 tablet by mouth 2 (two) times daily as needed for moderate constipation. 02/26/22  Yes Gonfa, Boyce Medici, MD  sodium chloride 1 g tablet TAKE 1 TABLET(1 GRAM) BY MOUTH THREE TIMES DAILY Patient taking differently: Take 1 g by mouth 3 (three) times daily. 11/30/21  Yes Ngetich, Dinah C, NP  sodium phosphate (FLEET) 7-19 GM/118ML ENEM Place 133 mLs (1 enema total) rectally daily as needed for severe constipation. 02/26/22  Yes Almon Hercules, MD  vitamin B-12 1000 MCG tablet Take 1 tablet (1,000 mcg total) by mouth daily. 02/27/22  Yes Almon Hercules, MD  hydrOXYzine (ATARAX) 10 MG tablet TAKE 1 TABLET(10 MG) BY MOUTH EVERY 8 HOURS AS NEEDED FOR ANXIETY Patient not taking: Reported on 03/03/2022 01/19/22   Ngetich, Dinah C, NP  QUEtiapine (SEROQUEL) 25 MG tablet Take 1  tablet (25 mg total) by mouth at bedtime as needed (agitation/sundowning). Patient not taking: Reported on 02/15/2022 02/09/22   Joycelyn Das, MD    Physical Exam: Vitals:   03/03/22 2300 03/03/22 2330 03/04/22 0046 03/04/22 0047  BP: (!) 158/74 (!) 157/59 135/62   Pulse: 80 78 84   Resp: 20 17 20    Temp:  98.1 F (36.7 C) 98.2 F (36.8 C)   TempSrc:   Oral   SpO2: 96% 99% 97%   Weight:    100.9 kg  Height:    5\' 5"  (1.651 m)   Constitutional: NAD, calm, comfortable, chronically ill-appearing elderly female laying flat in bed asleep but awoke easily to voice Eyes:  lids and conjunctivae normal ENMT: Mucous membranes are moist.  Neck: normal, supple Respiratory: clear to auscultation bilaterally, no wheezing, no crackles. Normal respiratory effort. No accessory muscle use.  Cardiovascular: Regular rate and rhythm, no murmurs / rubs / gallops. No extremity edema.   Abdomen: no tenderness,  Bowel  sounds positive.  Musculoskeletal: no clubbing / cyanosis.  Skin tightening and firmness noted to right anterior and lateral thigh without any overlying erythema or increased warmth.  Healing incision wound to right proximal lateral hip with Steri-Strips.  Healing lateral wound with staples along the right IT band.  Patient groaning with palpation of the right hip. Skin: See wound description as above Neurologic: Awoke easily to voice with expressive aphasia.  Able to follow commands and move all extremities. Psychiatric: Alert but has expressive aphasia data Reviewed:  See HPI  Assessment and Plan: * Postoperative abscess -Seroma versus abscess S/p underwent revision of right THA by Dr. on 02/24/2022 following a mechanical fall. -Presents with worsening pain of right hip and repeat CT femur today showed extensive skin thickening and subcutaneous soft tissue swelling of the thigh likely cellulitis.  There is also large superficial fluid collection along the anterior proximal thigh that could be abscess or postoperative seroma/hematoma. There is also coalescent fluid along the posterior lateral incision and the majority of the IT band with a similar differential as above.   -ED physician discussed with orthopedic Dr Linna Caprice and given the lack of fever or leukocytosis doubt infection but agree with continuing IV antibiotics and they will see in consultation in the morning.  No definitive plans for intervention at this time but will hold Plavix overnight pending final recommendation. -Continue IV vancomycin and cefepime.  Morbid obesity (HCC) BMI of 37  History of CVA (cerebrovascular accident) With expressive aphasia.  Reportedly from previous documentation to be nonverbal at baseline.  Hypokalemia Repleted with IV potassium 10 mEq x 2  Essential hypertension Continue amlodipine  Type 2 diabetes mellitus without complication, without long-term current use of insulin (HCC) With  hyperglycemia.  Placed on sensitive sliding scale ACHS      Advance Care Planning:   Code Status: DNR confirmed with paperwork at bedside  Consults: Orthopedic surgeon  Family Communication: No family at bedside  Severity of Illness: The appropriate patient status for this patient is OBSERVATION. Observation status is judged to be reasonable and necessary in order to provide the required intensity of service to ensure the patient's safety. The patient's presenting symptoms, physical exam findings, and initial radiographic and laboratory data in the context of their medical condition is felt to place them at decreased risk for further clinical deterioration. Furthermore, it is anticipated that the patient will be medically stable for discharge from the hospital within 2 midnights of admission.  Author: Anselm Jungling, DO 03/04/2022 1:53 AM  For on call review [email or web address].

## 2022-03-03 NOTE — Progress Notes (Signed)
A consult was received from an ED physician for cefepime per pharmacy dosing.  The patient's profile has been reviewed for ht/wt/allergies/indication/available labs.    A one time order has been placed for cefepime 2 g IV.    Further antibiotics/pharmacy consults should be ordered by admitting physician if indicated.                       Thank you, Laurina Bustle, BCPS 03/03/2022  6:14 PM

## 2022-03-03 NOTE — ED Provider Notes (Signed)
[place] DEPT Provider Note   CSN: [id number] Arrival date & time: 03/03/22  1504     History  No chief complaint on file.   Rebecca Barnes is a 76 y.o. female history of hypertension, diabetes, recent right periprosthetic fracture status post revision surgery done by Dr. Lyla Glassing on 7/5, here presenting with persistent right hip pain.  Patient is from a nursing home currently.  Per the nursing home, patient appears to be in pain around the surgical site.  They were concerned about the redness around the surgical site as well.  No additional falls.  Patient cannot answer any questions.  Patient has history of dementia.  Patient is on [place] [facility] currently.  The history is provided by the EMS personnel.  Level V caveat- dementia      Home Medications Prior to Admission medications   Medication Sig Start Date End Date Taking? Authorizing Provider  acetaminophen (TYLENOL) 325 MG tablet Take 650 mg by mouth every 6 (six) hours as needed for mild pain or headache. 06/13/20  Yes [provider]  amLODipine (NORVASC) 5 MG tablet TAKE 1 TABLET(5 MG) BY MOUTH DAILY Patient taking differently: Take 5 mg by mouth daily. 08/28/21  Yes Ngetich, Dinah C, NP  camphor-menthol (SARNA) lotion Apply topically as needed for itching. Patient taking differently: Apply 1 application  topically daily as needed for itching. Camphor - Menthol External Lotion 0.5-0.5% 12/11/21  Yes Setzer, Edman Circle, PA-C  cefadroxil (DURICEF) 500 MG capsule Take 2 capsules (1,000 mg total) by mouth 2 (two) times daily for 7 days. 02/26/22 03/05/22 Yes Mercy Riding, MD  clonazePAM (KLONOPIN) 0.5 MG tablet Take 1 tablet (0.5 mg total) by mouth daily as needed for anxiety. Patient taking differently: Take 0.5 mg by mouth every 12 (twelve) hours as needed for anxiety. 02/15/22 02/15/23 Yes Pahwani, Einar Grad, MD  clopidogrel (PLAVIX) 75 MG tablet Take 1 tablet (75 mg total) by mouth daily. 02/19/22  Yes  Pahwani, Einar Grad, MD  docusate sodium (COLACE) 100 MG capsule Take 1 capsule (100 mg total) by mouth 2 (two) times daily. 02/09/22  Yes Pokhrel, Laxman, MD  fenofibrate 54 MG tablet TAKE 1 TABLET(54 MG) BY MOUTH DAILY Patient taking differently: Take 54 mg by mouth daily. 01/04/22  Yes Ngetich, Dinah C, NP  FLUoxetine (PROZAC) 40 MG capsule Take one capsule by mouth once daily. Patient taking differently: Take 40 mg by mouth daily. 01/04/22  Yes Ngetich, Dinah C, NP  lidocaine 4 % Place 1 patch onto the skin daily.   Yes [provider]  melatonin 10 MG TABS Take 10 mg by mouth at bedtime. 12/11/21  Yes Setzer, Edman Circle, PA-C  metFORMIN (GLUCOPHAGE) 500 MG tablet TAKE 1 TABLET(500 MG) BY MOUTH DAILY WITH BREAKFAST Patient taking differently: Take 500 mg by mouth daily with breakfast. 01/13/22  Yes Ngetich, Dinah C, NP  naloxone (NARCAN) 0.4 MG/ML injection Inject 1 mL (0.4 mg total) into the vein as needed. 02/26/22  Yes Gonfa, Charlesetta Ivory, MD  NUEDEXTA 20-10 MG capsule Take 1 capsule by mouth 2 (two) times daily. X 7 days 03/01/22  Yes [provider]  Nutritional Supplements (NUTRITIONAL SUPPLEMENT PO) Take 1 Dose by mouth 2 (two) times daily. Med Pass 2.0   Yes [provider]  ondansetron (ZOFRAN) 4 MG tablet Take 1 tablet (4 mg total) by mouth every 6 (six) hours as needed for nausea. 02/09/22  Yes Pokhrel, Laxman, MD  oxyCODONE (ROXICODONE) 5 MG immediate release tablet Take  1 tablet (5 mg total) by mouth every 4 (four) hours as needed for up to 7 days for severe pain. 02/25/22 03/04/22 Yes Hill, Marciano Sequin, PA-C  oxymetazoline (AFRIN NASAL SPRAY) 0.05 % nasal spray Spray 1 spray into affected nostril if bleeding is uncontrolled after 20 min of direct pressure Patient taking differently: Place 1 spray into both nostrils daily as needed (nose bleed uncontrolled after 20 minutes, apply direct pressure.). 12/18/21  Yes Kathe Becton R, PA-C  senna-docusate (SENOKOT-S) 8.6-50 MG tablet Take 1  tablet by mouth 2 (two) times daily as needed for moderate constipation. 02/26/22  Yes Gonfa, Charlesetta Ivory, MD  sodium chloride 1 g tablet TAKE 1 TABLET(1 GRAM) BY MOUTH THREE TIMES DAILY Patient taking differently: Take 1 g by mouth 3 (three) times daily. 11/30/21  Yes Ngetich, Dinah C, NP  sodium phosphate (FLEET) 7-19 GM/118ML ENEM Place 133 mLs (1 enema total) rectally daily as needed for severe constipation. 02/26/22  Yes Mercy Riding, MD  vitamin B-12 1000 MCG tablet Take 1 tablet (1,000 mcg total) by mouth daily. 02/27/22  Yes Mercy Riding, MD  hydrOXYzine (ATARAX) 10 MG tablet TAKE 1 TABLET(10 MG) BY MOUTH EVERY 8 HOURS AS NEEDED FOR ANXIETY Patient not taking: Reported on 03/03/2022 01/19/22   Ngetich, Dinah C, NP  QUEtiapine (SEROQUEL) 25 MG tablet Take 1 tablet (25 mg total) by mouth at bedtime as needed (agitation/sundowning). Patient not taking: Reported on 02/15/2022 02/09/22   Flora Lipps, MD      Allergies    Januvia [sitagliptin], Statins, and Sulfa antibiotics    Review of Systems   Review of Systems  Musculoskeletal:        Right hip pain  Psychiatric/Behavioral:  Positive for agitation.   All other systems reviewed and are negative.   Physical Exam Updated Vital Signs BP (!) 165/74   Pulse 84   Resp 13   Ht 5\' 5"  (1.651 m)   Wt 97.5 kg   SpO2 92%   BMI 35.77 kg/m  Physical Exam Vitals and nursing note reviewed.  Constitutional:      Comments: Agitated and confused  HENT:     Head: Normocephalic.     Nose: Nose normal.     Mouth/Throat:     Mouth: Mucous membranes are moist.  Eyes:     Extraocular Movements: Extraocular movements intact.     Pupils: Pupils are equal, round, and reactive to light.  Cardiovascular:     Rate and Rhythm: Normal rate and regular rhythm.     Pulses: Normal pulses.     Heart sounds: Normal heart sounds.  Pulmonary:     Effort: Pulmonary effort is normal.     Breath sounds: Normal breath sounds.  Abdominal:     General: Abdomen is  flat.     Palpations: Abdomen is soft.  Musculoskeletal:     Cervical back: Normal range of motion and neck supple.     Comments: Patient had staples from the right knee all the way to the right hip area.  There is some redness along the staple line.  There is no obvious purulent discharge.  Patient has another wound on the anterior aspect of the hip with some Steri-Strips and no obvious erythema around it. Please see picture  Skin:    General: Skin is warm.     Capillary Refill: Capillary refill takes less than 2 seconds.  Neurological:     General: No focal deficit present.  Psychiatric:  Mood and Affect: Mood normal.        Behavior: Behavior normal.      ED Results / Procedures / Treatments   Labs (all labs ordered are listed, but only abnormal results are displayed) Labs Reviewed  CBC WITH DIFFERENTIAL/PLATELET - Abnormal; Notable for the following components:      Result Value   RBC 3.24 (*)    Hemoglobin 8.5 (*)    HCT 26.7 (*)    RDW 17.3 (*)    Platelets 502 (*)    Monocytes Absolute 1.1 (*)    All other components within normal limits  COMPREHENSIVE METABOLIC PANEL - Abnormal; Notable for the following components:   Potassium 2.8 (*)    CO2 20 (*)    Glucose, Bld 189 (*)    Calcium 8.0 (*)    Total Protein 5.5 (*)    Albumin 2.2 (*)    All other components within normal limits  LACTIC ACID, PLASMA - Abnormal; Notable for the following components:   Lactic Acid, Venous 3.0 (*)    All other components within normal limits  I-STAT CHEM 8, ED - Abnormal; Notable for the following components:   Potassium 3.1 (*)    Creatinine, Ser 0.30 (*)    Glucose, Bld 204 (*)    Calcium, Ion 1.13 (*)    TCO2 20 (*)    Hemoglobin 8.2 (*)    HCT 24.0 (*)    All other components within normal limits  CULTURE, BLOOD (ROUTINE X 2)  CULTURE, BLOOD (ROUTINE X 2)  CULTURE, BLOOD (ROUTINE X 2)  CULTURE, BLOOD (ROUTINE X 2)  LACTIC ACID, PLASMA  URINALYSIS, ROUTINE W REFLEX  MICROSCOPIC    EKG None  Radiology CT HEAD WO CONTRAST ( )  Result Date: 03/03/2022 CLINICAL DATA:  Mental status change, unknown cause EXAM: CT HEAD WITHOUT CONTRAST TECHNIQUE: Contiguous axial images were obtained from the base of the skull through the vertex without intravenous contrast. RADIATION DOSE REDUCTION: This exam was performed according to the departmental dose-optimization program which includes automated exposure control, adjustment of the mA and/or kV according to patient size and/or use of iterative reconstruction technique. COMPARISON:  CT head 02/23/2022. FINDINGS: Brain: Redemonstrated left posterior MCA territory infarct with encephalomalacia predominantly in the left parietal and temporal lobes. Additional patchy white matter hypoattenuation, nonspecific but compatible with chronic microvascular ischemic disease. No evidence of acute large vascular territory infarct, acute hemorrhage, mass lesion midline shift, or hydrocephalus. Cerebral atrophy. Vascular: No hyperdense vessel identified. Calcific atherosclerosis. Skull: No acute fracture. Sinuses/Orbits: Clear sinuses.  No acute orbital findings. Other: No mastoid effusions. IMPRESSION: 1. No evidence of acute intracranial abnormality. 2. Remote left MCA territory infarct and chronic microvascular ischemic disease. Electronically Signed   By: Feliberto Harts M.D.   On: 03/03/2022 16:59   DG Chest Port 1 View  Result Date: 03/03/2022 CLINICAL DATA:  Altered mental status. EXAM: PORTABLE CHEST 1 VIEW COMPARISON:  02/22/2022 FINDINGS: Numerous leads and wires project over the chest. Midline trachea. Borderline cardiomegaly. The Chin overlies the apices minimally. No pleural effusion or pneumothorax. Clear lungs. IMPRESSION: No acute cardiopulmonary disease. Electronically Signed   By: Jeronimo Greaves M.D.   On: 03/03/2022 16:15    Procedures Procedures    CRITICAL CARE Performed by: Richardean Canal   Total critical care time:  30 minutes  Critical care time was exclusive of separately billable procedures and treating other patients.  Critical care was necessary to treat or prevent imminent or life-threatening  deterioration.  Critical care was time spent personally by me on the following activities: development of treatment plan with patient and/or surrogate as well as nursing, discussions with consultants, evaluation of patient's response to treatment, examination of patient, obtaining history from patient or surrogate, ordering and performing treatments and interventions, ordering and review of laboratory studies, ordering and review of radiographic studies, pulse oximetry and re-evaluation of patient's condition.   Medications Ordered in ED Medications  potassium chloride 10 mEq in 100 mL IVPB (10 mEq Intravenous New Bag/Given 03/03/22 1708)  HYDROmorphone (DILAUDID) injection 1 mg (1 mg Intravenous Given 03/03/22 1540)  ondansetron (ZOFRAN) injection 4 mg (4 mg Intravenous Given 03/03/22 1541)  sodium chloride 0.9 % bolus 1,000 mL (1,000 mLs Intravenous New Bag/Given 03/03/22 1601)  iohexol (OMNIPAQUE) 300 MG/ML solution 100 mL (100 mLs Intravenous Contrast Given 03/03/22 1635)  LORazepam (ATIVAN) injection 1 mg (1 mg Intravenous Given 03/03/22 1709)    ED Course/ Medical Decision Making/ A&P                           Medical Decision Making Sonnet Rizor is a 76 y.o. female here presenting with persistent right hip pain.  Patient has extensive redness around the surgical site. Concern for possible postop infection including cellulitis versus abscess.  Plan to do sepsis work-up with CBC and CMP and lactate and cultures and CT of the femur with contrast.  She is already on antibiotics and if she has a postop infection, likely will need to consult Ortho before ordering antibiotics.  6:00 PM I reviewed patient's labs and independently interpreted imaging studies.  Patient's potassium is 2.8 is getting supplemented.   Patient's lactate is elevated at 3 but white blood cell count is normal.  Patient CT showed extensive seroma versus postop abscess.  I discussed case with Dr. Odis Hollingshead from [facility].  He states that this is likely postop seroma.  He does not think it is a postop abscess as patient is not febrile or appear septic.  He states that Ortho will see patient tomorrow as a consult.  At this point, I ordered blood cultures and I will broaden her coverage to Vanco and cefepime.  Ortho does not have any preference regarding antibiotic coverage and will see as a consult tomorrow.  Medicine service to admit  Problems Addressed: Hypokalemia: acute illness or injury Postoperative pain: acute illness or injury Postprocedural seroma of a musculoskeletal structure following other procedure: acute illness or injury  Amount and/or Complexity of Data Reviewed Labs: ordered. Decision-making details documented in ED Course. Radiology: ordered and independent interpretation performed. Decision-making details documented in ED Course.  Risk Prescription drug management. Decision regarding hospitalization.    Final Clinical Impression(s) / ED Diagnoses Final diagnoses:  None    Rx / DC Orders ED Discharge Orders     None         Charlynne Pander, MD 03/03/22 [phone]

## 2022-03-04 DIAGNOSIS — F0390 Unspecified dementia without behavioral disturbance: Secondary | ICD-10-CM | POA: Diagnosis present

## 2022-03-04 DIAGNOSIS — Z515 Encounter for palliative care: Secondary | ICD-10-CM

## 2022-03-04 DIAGNOSIS — F03911 Unspecified dementia, unspecified severity, with agitation: Secondary | ICD-10-CM

## 2022-03-04 DIAGNOSIS — D649 Anemia, unspecified: Secondary | ICD-10-CM | POA: Diagnosis present

## 2022-03-04 DIAGNOSIS — M25551 Pain in right hip: Secondary | ICD-10-CM | POA: Diagnosis present

## 2022-03-04 DIAGNOSIS — M25559 Pain in unspecified hip: Secondary | ICD-10-CM | POA: Diagnosis present

## 2022-03-04 DIAGNOSIS — L0291 Cutaneous abscess, unspecified: Secondary | ICD-10-CM | POA: Diagnosis present

## 2022-03-04 DIAGNOSIS — Z882 Allergy status to sulfonamides status: Secondary | ICD-10-CM | POA: Diagnosis not present

## 2022-03-04 DIAGNOSIS — Z7189 Other specified counseling: Secondary | ICD-10-CM | POA: Diagnosis not present

## 2022-03-04 DIAGNOSIS — F419 Anxiety disorder, unspecified: Secondary | ICD-10-CM | POA: Diagnosis present

## 2022-03-04 DIAGNOSIS — T8149XA Infection following a procedure, other surgical site, initial encounter: Secondary | ICD-10-CM | POA: Diagnosis not present

## 2022-03-04 DIAGNOSIS — R52 Pain, unspecified: Secondary | ICD-10-CM | POA: Diagnosis not present

## 2022-03-04 DIAGNOSIS — L0390 Cellulitis, unspecified: Secondary | ICD-10-CM | POA: Diagnosis present

## 2022-03-04 DIAGNOSIS — J45909 Unspecified asthma, uncomplicated: Secondary | ICD-10-CM | POA: Diagnosis present

## 2022-03-04 DIAGNOSIS — Z7984 Long term (current) use of oral hypoglycemic drugs: Secondary | ICD-10-CM | POA: Diagnosis not present

## 2022-03-04 DIAGNOSIS — Z888 Allergy status to other drugs, medicaments and biological substances status: Secondary | ICD-10-CM | POA: Diagnosis not present

## 2022-03-04 DIAGNOSIS — G8918 Other acute postprocedural pain: Secondary | ICD-10-CM | POA: Diagnosis not present

## 2022-03-04 DIAGNOSIS — I10 Essential (primary) hypertension: Secondary | ICD-10-CM | POA: Diagnosis present

## 2022-03-04 DIAGNOSIS — M9701XD Periprosthetic fracture around internal prosthetic right hip joint, subsequent encounter: Secondary | ICD-10-CM | POA: Diagnosis not present

## 2022-03-04 DIAGNOSIS — W19XXXD Unspecified fall, subsequent encounter: Secondary | ICD-10-CM | POA: Diagnosis present

## 2022-03-04 DIAGNOSIS — E876 Hypokalemia: Secondary | ICD-10-CM | POA: Diagnosis present

## 2022-03-04 DIAGNOSIS — R4701 Aphasia: Secondary | ICD-10-CM | POA: Diagnosis not present

## 2022-03-04 DIAGNOSIS — Y838 Other surgical procedures as the cause of abnormal reaction of the patient, or of later complication, without mention of misadventure at the time of the procedure: Secondary | ICD-10-CM | POA: Diagnosis present

## 2022-03-04 DIAGNOSIS — E119 Type 2 diabetes mellitus without complications: Secondary | ICD-10-CM | POA: Diagnosis present

## 2022-03-04 DIAGNOSIS — Z96641 Presence of right artificial hip joint: Secondary | ICD-10-CM | POA: Diagnosis present

## 2022-03-04 DIAGNOSIS — L7634 Postprocedural seroma of skin and subcutaneous tissue following other procedure: Secondary | ICD-10-CM | POA: Diagnosis present

## 2022-03-04 DIAGNOSIS — I69320 Aphasia following cerebral infarction: Secondary | ICD-10-CM | POA: Diagnosis not present

## 2022-03-04 DIAGNOSIS — Z6837 Body mass index (BMI) 37.0-37.9, adult: Secondary | ICD-10-CM | POA: Diagnosis not present

## 2022-03-04 DIAGNOSIS — Z66 Do not resuscitate: Secondary | ICD-10-CM | POA: Diagnosis present

## 2022-03-04 DIAGNOSIS — Z20822 Contact with and (suspected) exposure to covid-19: Secondary | ICD-10-CM | POA: Diagnosis present

## 2022-03-04 DIAGNOSIS — Z79899 Other long term (current) drug therapy: Secondary | ICD-10-CM | POA: Diagnosis not present

## 2022-03-04 DIAGNOSIS — Z8673 Personal history of transient ischemic attack (TIA), and cerebral infarction without residual deficits: Secondary | ICD-10-CM | POA: Diagnosis not present

## 2022-03-04 LAB — CBC
HCT: 26.6 % — ABNORMAL LOW (ref 36.0–46.0)
Hemoglobin: 8.3 g/dL — ABNORMAL LOW (ref 12.0–15.0)
MCH: 26.6 pg (ref 26.0–34.0)
MCHC: 31.2 g/dL (ref 30.0–36.0)
MCV: 85.3 fL (ref 80.0–100.0)
Platelets: 431 10*3/uL — ABNORMAL HIGH (ref 150–400)
RBC: 3.12 MIL/uL — ABNORMAL LOW (ref 3.87–5.11)
RDW: 17.5 % — ABNORMAL HIGH (ref 11.5–15.5)
WBC: 9.2 10*3/uL (ref 4.0–10.5)
nRBC: 0 % (ref 0.0–0.2)

## 2022-03-04 LAB — GLUCOSE, CAPILLARY
Glucose-Capillary: 128 mg/dL — ABNORMAL HIGH (ref 70–99)
Glucose-Capillary: 130 mg/dL — ABNORMAL HIGH (ref 70–99)
Glucose-Capillary: 128 mg/dL — ABNORMAL HIGH (ref 70–99)
Glucose-Capillary: 127 mg/dL — ABNORMAL HIGH (ref 70–99)

## 2022-03-04 LAB — BASIC METABOLIC PANEL
Anion gap: 8 (ref 5–15)
BUN: 8 mg/dL (ref 8–23)
CO2: 24 mmol/L (ref 22–32)
Calcium: 8.3 mg/dL — ABNORMAL LOW (ref 8.9–10.3)
Chloride: 105 mmol/L (ref 98–111)
Creatinine, Ser: 0.42 mg/dL — ABNORMAL LOW (ref 0.44–1.00)
GFR, Estimated: >60 mL/min (ref >60)
Glucose, Bld: 115 mg/dL — ABNORMAL HIGH (ref 70–99)
Potassium: 3.6 mmol/L (ref 3.5–5.1)
Sodium: 137 mmol/L (ref 135–145)

## 2022-03-04 MED ORDER — DEXTROMETHORPHAN-QUINIDINE 20-10 MG PO CAPS: 1.0000 | ORAL_CAPSULE | Freq: Two times a day (BID) | ORAL | Status: DC

## 2022-03-04 MED ORDER — CLOPIDOGREL BISULFATE 75 MG PO TABS
75.0000 mg | ORAL_TABLET | Freq: Every day | ORAL | Status: DC
  Administered 2022-03-04 – 2022-03-05 (×2): 75 mg via ORAL
  Filled 2022-03-04 (×2): qty 1

## 2022-03-04 MED ORDER — CLONAZEPAM 0.5 MG PO TABS
0.5000 mg | ORAL_TABLET | Freq: Three times a day (TID) | ORAL | Status: DC | PRN
  Administered 2022-03-04 – 2022-03-07 (×5): 0.5 mg via ORAL
  Filled 2022-03-04 (×5): qty 1

## 2022-03-04 MED ORDER — LORAZEPAM 2 MG/ML IJ SOLN
1.0000 mg | Freq: Once | INTRAMUSCULAR | Status: AC
  Administered 2022-03-04: 1 mg via INTRAVENOUS
  Filled 2022-03-04: qty 1

## 2022-03-04 MED ORDER — MORPHINE SULFATE (PF) 2 MG/ML IV SOLN
1.0000 mg | INTRAVENOUS | Status: DC | PRN
  Administered 2022-03-04: 1 mg via INTRAVENOUS
  Filled 2022-03-04: qty 1

## 2022-03-04 MED ORDER — BISACODYL 10 MG RE SUPP: 10.0000 mg | Freq: Every day | RECTAL | Status: DC | PRN

## 2022-03-04 MED ORDER — OLANZAPINE 5 MG PO TBDP
2.5000 mg | ORAL_TABLET | Freq: Two times a day (BID) | ORAL | Status: DC
  Administered 2022-03-04 – 2022-03-08 (×8): 2.5 mg via ORAL
  Filled 2022-03-04 (×9): qty 0.5

## 2022-03-04 NOTE — Progress Notes (Addendum)
Patient seen and examined. WBC WNL. Afebrile VSS. Anterior hip incis c/di without erythema or fluid collection. Posterior incis has Aquacel - intact without drainage. Mild expected postop swelling to thigh. NVI.  -50% WB RLE with walker -posterior hip precautions -chemical DVT ppx held due to GIB -PO pain control -ok for d/c from ortho persective. F/u in the office 7/24 or 7/25. Call [phone] to schedule.

## 2022-03-04 NOTE — Assessment & Plan Note (Signed)
With expressive aphasia.  Reportedly from previous documentation to be nonverbal at baseline.

## 2022-03-04 NOTE — Progress Notes (Signed)
Patient removed peripheral iv. IV intact

## 2022-03-04 NOTE — Progress Notes (Signed)
PROGRESS NOTE    Rebecca Barnes  NLZ:[id number] DOB: 1946-06-30 DOA: 03/03/2022 PCP: Caesar Bookman, NP    Brief Narrative:  Rebecca Barnes is a 76 y.o. female with medical history significant of dementia, CVA with residual aphasia, type 2 diabetes, hypertension, anemia who presents with worsening pain of right hip.   Patient was just admitted from 02/22/2022-02/26/2022 for right hip periprosthetic hip fracture s/p fall.  She underwent revision of right THA by Dr. Linna Caprice on 02/24/2022.  EBL about 1200cc and was transfused 3 units PRBC with stable hemoglobin afterwards and was started on Plavix alone for VTE prophylaxis.  She was started on IV ceftriaxone for inflammatory tissue irritation around the surgical site and was discharged on a week of p.o. cefadroxil.  Came back to the ER with worsening hip pain per nursing home-- patient has dementia and is aphasic.   Assessment and Plan: * Postoperative seroma vs abscess -suspected seroma- no sign of infection S/p underwent revision of right THA by Dr. Linna Caprice on 02/24/2022 following a mechanical fall. -allegedly Presents with worsening pain of right hip and repeat CT femur today showed extensive skin thickening and subcutaneous soft tissue swelling of the thigh.  There is also large superficial fluid collection along the anterior proximal thigh that could be abscess or postoperative seroma/hematoma. There is also coalescent fluid along the posterior lateral incision and the majority of the IT band with a similar differential as above.   -ED physician discussed with orthopedic Dr Odis Hollingshead and given the lack of fever or leukocytosis doubt infection but agree with continuing IV antibiotics and they will see in consultation in the morning.  No definitive plans for intervention at this time but will hold Plavix overnight pending final recommendation. -Continue IV vancomycin and cefepime for now  Morbid obesity (HCC) .Estimated body mass index is 37.02  kg/m as calculated from the following:   Height as of this encounter: 5\' 5"  (1.651 m).   Weight as of this encounter: 100.9 kg.   History of CVA (cerebrovascular accident) With expressive aphasia.  Reportedly from previous documentation to be nonverbal at baseline.  Hypokalemia Replete  Essential hypertension Continue amlodipine  Type 2 diabetes mellitus without complication, without long-term current use of insulin (HCC) With hyperglycemia.   -SSI        DVT prophylaxis: SCDs Start: 03/03/22 2144    Code Status: DNR  Disposition Plan:  Level of care: Med-Surg Status is: Observation The patient will require care spanning > 2 midnights and should be moved to inpatient because: await ortho eval    Consultants:  ortho   Subjective: In bed, appears comfortable  Objective: Vitals:   03/04/22 0046 03/04/22 0047 03/04/22 0442 03/04/22 1119  BP: 135/62  (!) 143/76 (!) 174/77  Pulse: 84  90 83  Resp: 20  14   Temp: 98.2 F (36.8 C)  98.1 F (36.7 C) 99.7 F (37.6 C)  TempSrc: Oral  Oral Oral  SpO2: 97%  95% 96%  Weight:  100.9 kg    Height:  5\' 5"  (1.651 m)      Intake/Output Summary (Last 24 hours) at 03/04/2022 1143 Last data filed at 03/04/2022 0737 Gross per 24 hour  Intake 0 ml  Output 1000 ml  Net -1000 ml   Filed Weights   03/03/22 1547 03/04/22 0047  Weight: 97.5 kg 100.9 kg    Examination:   General: Appearance:    Obese female in no acute distress     Lungs:  respirations unlabored  Heart:    Normal heart rate.    MS:   All extremities are intact.    Neurologic:   Will awaken, does not speak       Data Reviewed: I have personally reviewed following labs and imaging studies  CBC: Recent Labs  Lab 02/26/22 0347 03/03/22 1546 03/03/22 1607 03/04/22 0342  WBC 13.5* 9.7  --  9.2  NEUTROABS  --  6.6  --   --   HGB 9.7* 8.5* 8.2* 8.3*  HCT 29.9* 26.7* 24.0* 26.6*  MCV 81.3 82.4  --  85.3  PLT 336 502*  --  431*   Basic  Metabolic Panel: Recent Labs  Lab 02/26/22 0347 03/03/22 1546 03/03/22 1607 03/04/22 0342  NA 137 135 136 137  K 3.7 2.8* 3.1* 3.6  CL 105 106 101 105  CO2 25 20*  --  24  GLUCOSE 132* 189* 204* 115*  BUN 8 11 9 8   CREATININE 0.45 0.46 0.30* 0.42*  CALCIUM 8.6* 8.0*  --  8.3*  MG 1.7  --   --   --   PHOS 2.5  --   --   --    GFR: Estimated Creatinine Clearance: 71.6 mL/min (A) (by C-G formula based on SCr of 0.42 mg/dL (L)). Liver Function Tests: Recent Labs  Lab 02/26/22 0347 03/03/22 1546  AST  --  16  ALT  --  12  ALKPHOS  --  73  BILITOT  --  0.4  PROT  --  5.5*  ALBUMIN 2.5* 2.2*   No results for input(s): "LIPASE", "AMYLASE" in the last 168 hours. No results for input(s): "AMMONIA" in the last 168 hours. Coagulation Profile: No results for input(s): "INR", "PROTIME" in the last 168 hours. Cardiac Enzymes: No results for input(s): "CKTOTAL", "CKMB", "CKMBINDEX", "TROPONINI" in the last 168 hours. BNP (last 3 results) No results for input(s): "PROBNP" in the last 8760 hours. HbA1C: No results for input(s): "HGBA1C" in the last 72 hours. CBG: Recent Labs  Lab 02/26/22 0744 02/26/22 1138 03/03/22 2217 03/04/22 0736 03/04/22 1125  GLUCAP 122* 122* 159* 128* 130*   Lipid Profile: No results for input(s): "CHOL", "HDL", "LDLCALC", "TRIG", "CHOLHDL", "LDLDIRECT" in the last 72 hours. Thyroid Function Tests: No results for input(s): "TSH", "T4TOTAL", "FREET4", "T3FREE", "THYROIDAB" in the last 72 hours. Anemia Panel: No results for input(s): "VITAMINB12", "FOLATE", "FERRITIN", "TIBC", "IRON", "RETICCTPCT" in the last 72 hours. Sepsis Labs: Recent Labs  Lab 03/03/22 1544 03/03/22 1840  LATICACIDVEN 3.0* 1.6    Recent Results (from the past 240 hour(s))  Resp Panel by RT-PCR (Flu A&B, Covid) Anterior Nasal Swab     Status: None   Collection Time: 02/23/22  1:39 AM   Specimen: Anterior Nasal Swab  Result Value Ref Range Status   SARS Coronavirus 2 by  RT PCR NEGATIVE NEGATIVE Final    Comment: (NOTE) SARS-CoV-2 target nucleic acids are NOT DETECTED.  The SARS-CoV-2 RNA is generally detectable in upper respiratory specimens during the acute phase of infection. The lowest concentration of SARS-CoV-2 viral copies this assay can detect is 138 copies/mL. A negative result does not preclude SARS-Cov-2 infection and should not be used as the sole basis for treatment or other patient management decisions. A negative result may occur with  improper specimen collection/handling, submission of specimen other than nasopharyngeal swab, presence of viral mutation(s) within the areas targeted by this assay, and inadequate number of viral copies(<138 copies/mL). A negative result must be combined  with clinical observations, patient history, and epidemiological information. The expected result is Negative.  Fact Sheet for Patients:  BloggerCourse.com  Fact Sheet for Healthcare Providers:  SeriousBroker.it  This test is no t yet approved or cleared by the [place] FDA and  has been authorized for detection and/or diagnosis of SARS-CoV-2 by FDA under an Emergency Use Authorization (EUA). This EUA will remain  in effect (meaning this test can be used) for the duration of the COVID-19 declaration under Section 564(b)(1) of the Act, 21 U.S.C.section 360bbb-3(b)(1), unless the authorization is terminated  or revoked sooner.       Influenza A by PCR NEGATIVE NEGATIVE Final   Influenza B by PCR NEGATIVE NEGATIVE Final    Comment: (NOTE) The Xpert Xpress SARS-CoV-2/FLU/RSV plus assay is intended as an aid in the diagnosis of influenza from Nasopharyngeal swab specimens and should not be used as a sole basis for treatment. Nasal washings and aspirates are unacceptable for Xpert Xpress SARS-CoV-2/FLU/RSV testing.  Fact Sheet for Patients: BloggerCourse.com  Fact Sheet  for Healthcare Providers: SeriousBroker.it  This test is not yet approved or cleared by the [place] FDA and has been authorized for detection and/or diagnosis of SARS-CoV-2 by FDA under an Emergency Use Authorization (EUA). This EUA will remain in effect (meaning this test can be used) for the duration of the COVID-19 declaration under Section 564(b)(1) of the Act, 21 U.S.C. section 360bbb-3(b)(1), unless the authorization is terminated or revoked.  Performed at [facility], [address], [place], [place] [address]   Culture, blood (Routine X 2) w Reflex to ID Panel     Status: None   Collection Time: 02/23/22 11:01 AM   Specimen: BLOOD RIGHT HAND  Result Value Ref Range Status   Specimen Description BLOOD RIGHT HAND  Final   Special Requests   Final    BOTTLES DRAWN AEROBIC AND ANAEROBIC Blood Culture results may not be optimal due to an inadequate volume of blood received in culture bottles   Culture   Final    NO GROWTH 5 DAYS Performed at [facility], [address], [place], [place] [address]    Report Status 02/28/2022 FINAL  Final  Culture, blood (Routine X 2) w Reflex to ID Panel     Status: None   Collection Time: 02/23/22  4:19 PM   Specimen: BLOOD  Result Value Ref Range Status   Specimen Description   Final    BLOOD RIGHT ANTECUBITAL Performed at [facility], [address], [place], [place] [address]    Special Requests   Final    BOTTLES DRAWN AEROBIC ONLY Blood Culture adequate volume Performed at [facility], [address], [place], [place] [address]    Culture   Final    NO GROWTH 5 DAYS Performed at [facility], [address], [place], [place] [address]    Report Status 02/28/2022 FINAL  Final  MRSA Next Gen by PCR, Nasal     Status: None   Collection Time: 02/23/22  5:29 PM   Specimen: Nasal Mucosa; Nasal Swab  Result Value Ref Range Status   MRSA by PCR  Next Gen NOT DETECTED NOT DETECTED Final    Comment: (NOTE) The GeneXpert MRSA Assay (FDA approved for NASAL specimens only), is one component of a comprehensive MRSA colonization surveillance program. It is not intended to diagnose MRSA infection nor to guide or monitor treatment for MRSA infections. Test performance is  not FDA approved in patients less than 28 years old. Performed at [facility], [address], [place], [place] [address]   Blood culture (routine x 2)     Status: None (Preliminary result)   Collection Time: 03/03/22  3:46 PM   Specimen: BLOOD  Result Value Ref Range Status   Specimen Description   Final    BLOOD BLOOD LEFT HAND Performed at [facility], [address], [place], [place] [address]    Special Requests   Final    BOTTLES DRAWN AEROBIC AND ANAEROBIC Blood Culture adequate volume Performed at [facility], [address] [place], [place] [address]    Culture   Final    NO GROWTH < 24 HOURS Performed at [facility], [address] [place], [place] [address]    Report Status PENDING  Incomplete  Blood culture (routine x 2)     Status: None (Preliminary result)   Collection Time: 03/03/22  4:01 PM   Specimen: BLOOD  Result Value Ref Range Status   Specimen Description   Final    BLOOD SITE NOT SPECIFIED Performed at [facility], [address] [place], [place] [address]    Special Requests   Final    BOTTLES DRAWN AEROBIC ONLY Blood Culture results may not be optimal due to an excessive volume of blood received in culture bottles Performed at [facility], [address], [place], [place] [address]    Culture   Final    NO GROWTH < 24 HOURS Performed at [facility], [address], [place], [place] [address]    Report Status PENDING  Incomplete         Radiology Studies: CT FEMUR RIGHT W CONTRAST  Result Date:  03/03/2022 CLINICAL DATA:  Soft tissue infection suspected, thigh, xray done R thigh swelling. recent hip revision surgery r/o osteo vs fracture vs soft swelling infection EXAM: CT OF THE LOWER RIGHT EXTREMITY WITH CONTRAST TECHNIQUE: Multidetector CT imaging of the lower right extremity was performed according to the standard protocol following intravenous contrast administration. RADIATION DOSE REDUCTION: This exam was performed according to the departmental dose-optimization program which includes automated exposure control, adjustment of the mA and/or kV according to patient size and/or use of iterative reconstruction technique. CONTRAST:  OMNIPAQUE IOHEXOL 300 MG/ML  SOLN COMPARISON:  None Available. FINDINGS: Bones/Joint/Cartilage Postsurgical changes of recent arthroplasty revision with new long stem right hip arthroplasty and cerclage wire fixation for a periprosthetic fracture. Near anatomic fracture alignment. Intact hardware without evidence of loosening. There is no bony erosion or frank bony destruction. Probable trace joint effusion. There is tricompartment osteoarthritis of the knee with severe medial compartment joint space narrowing. Small right knee joint effusion. Ligaments Suboptimally assessed by CT. Muscles and Tendons There is a fluid collection on the anterior compartment of the thigh, tracking towards the hip joint. This communicates with a superficial rim enhancing collection along the anterior proximal thigh which measures up to 8.6 x 4.0 x 16.3 cm (series 7, image 59, series 12, image 61). Soft tissues Posterolateral incision with underlying coalescent fluid along the superficial surface of the IT band, extending from the hip to the level of the distal femur, measuring 4.3 x 2.3 cm. This demonstrates no clear rim enhancement. There is extensive skin thickening and subcutaneous soft tissue swelling of the right thigh. IMPRESSION: Postsurgical changes of recent right hip revision with  long stem arthroplasty  and cerclage wire fixation for a periprosthetic fracture. Near anatomic fracture alignment. Intact hardware without evidence of loosening. Extensive skin thickening and subcutaneous soft tissue swelling of the thigh, as can be seen in cellulitis. Large superficial fluid collection with rim enhancement along the anterior proximal thigh measuring 8.6 x 4.0 cm in the axial plane and up to 6.3 cm and proximal-distal extent, with probable communicating fluid coursing deep towards the right hip joint, with punctate foci of gas. This could represent abscess or postoperative seroma/hematoma. Posterolateral incision with additional underlying coalescent fluid along the majority of the IT band, measuring 4.3 x 2.3 cm in the axial plane, without clear rim enhancement. Differential is the same as above but felt less likely to be infectious given no definitive rim enhancement. 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 Small right knee joint effusion. Electronically Signed   By: Caprice RenshawJacob  Kahn M.D.   On: 03/03/2022 17:15   CT HEAD WO CONTRAST (5MM)  Result Date: 03/03/2022 CLINICAL DATA:  Mental status change, unknown cause EXAM: CT HEAD WITHOUT CONTRAST TECHNIQUE: Contiguous axial images were obtained from the base of the skull through the vertex without intravenous contrast. RADIATION DOSE REDUCTION: This exam was performed according to the departmental dose-optimization program which includes automated exposure control, adjustment of the mA and/or kV according to patient size and/or use of iterative reconstruction technique. COMPARISON:  CT head 02/23/2022. FINDINGS: Brain: Redemonstrated left posterior MCA territory infarct with encephalomalacia predominantly in the left parietal and temporal lobes. Additional patchy white matter hypoattenuation, nonspecific but compatible with chronic microvascular ischemic disease. No evidence of acute large vascular territory  infarct, acute hemorrhage, mass lesion midline shift, or hydrocephalus. Cerebral atrophy. Vascular: No hyperdense vessel identified. Calcific atherosclerosis. Skull: No acute fracture. Sinuses/Orbits: Clear sinuses.  No acute orbital findings. Other: No mastoid effusions. IMPRESSION: 1. No evidence of acute intracranial abnormality. 2. Remote left MCA territory infarct and chronic microvascular ischemic disease. Electronically Signed   By: Feliberto HartsFrederick S Jones M.D.   On: 03/03/2022 16:59   DG Chest Port 1 View  Result Date: 03/03/2022 CLINICAL DATA:  Altered mental status. EXAM: PORTABLE CHEST 1 VIEW COMPARISON:  02/22/2022 FINDINGS: Numerous leads and wires project over the chest. Midline trachea. Borderline cardiomegaly. The Chin overlies the apices minimally. No pleural effusion or pneumothorax. Clear lungs. IMPRESSION: No acute cardiopulmonary disease. Electronically Signed   By: Jeronimo GreavesKyle  Talbot M.D.   On: 03/03/2022 16:15        Scheduled Meds:  amLODipine  5 mg Oral Daily   docusate sodium  100 mg Oral BID   fenofibrate  54 mg Oral Daily   FLUoxetine  40 mg Oral Daily   insulin aspart  0-6 Units Subcutaneous TID PC & HS   lidocaine  1 patch Transdermal Q24H   melatonin  10 mg Oral QHS   sodium chloride  1 g Oral TID   cyanocobalamin  1,000 mcg Oral Daily   Continuous Infusions:  ceFEPime (MAXIPIME) IV 2 g (03/04/22 0506)   vancomycin       LOS: 0 days    Time spent: 45 minutes spent on chart review, discussion with nursing staff, consultants, updating family and interview/physical exam; more than 50% of that time was spent in counseling and/or coordination of care.    Joseph ArtJessica U Granville Whitefield, DO [facility] Available via Epic secure chat 7am-7pm After these hours, please refer to coverage provider listed on [email or web address] 03/04/2022, 11:43 AM

## 2022-03-04 NOTE — Consult Note (Addendum)
ORTHOPAEDIC CONSULTATION  REQUESTING PHYSICIAN: Joseph Art, DO  PCP:  Caesar Bookman, NP  Chief Complaint: Right hip pain  HPI: Rebecca Barnes is a 76 y.o. female  with medical history significant of dementia, CVA with residual aphasia, type 2 diabetes, hypertension, anemia who presents with alleged worsening pain of right hip per nursing home.   Patient was just admitted from 02/22/2022-02/26/2022 for right hip periprosthetic hip fracture s/p fall.  She underwent revision of right THA by Dr. Linna Caprice on 02/24/2022.  EBL about 1200cc and was transfused 3 units PRBC with stable hemoglobin afterwards and was started on Plavix alone for VTE prophylaxis.  She was started on IV ceftriaxone for inflammatory tissue irritation around the surgical site and was discharged on a week of p.o. cefadroxil.  Came back to the ER with worsening hip pain per nursing home-- patient has dementia and is aphasic.   Past Medical History:  Diagnosis Date   Anxiety    Asthma    Diabetes mellitus without complication (HCC)    Hypertension    Stroke [facility])    Past Surgical History:  Procedure Laterality Date   arm fracture     arm surgery   CHOLECYSTECTOMY     TOTAL HIP ARTHROPLASTY Right 02/02/2022   Procedure: TOTAL HIP ARTHROPLASTY ANTERIOR APPROACH;  Surgeon: Samson Frederic, MD;  Location: [facility] OR;  Service: Orthopedics;  Laterality: Right;   TOTAL HIP REVISION Right 02/24/2022   Procedure: FEMUR COMPONENT REVISION;  Surgeon: Samson Frederic, MD;  Location: [facility] ORS;  Service: Orthopedics;  Laterality: Right;   Social History   Socioeconomic History   Marital status: Widowed    Spouse name: Not on file   Number of children: Not on file   Years of education: Not on file   Highest education level: Not on file  Occupational History   Occupation: Retired  Tobacco Use   Smoking status: Never   Smokeless tobacco: Never  Vaping Use   Vaping Use: Never used  Substance and Sexual Activity   Alcohol  use: Never   Drug use: Not Currently    Types: Marijuana    Comment: ocassionally   Sexual activity: Not Currently  Other Topics Concern   Not on file  Social History Narrative   Not on file   Social Determinants of Health   Financial Resource Strain: Low Risk  (02/13/2020)   Overall Financial Resource Strain (CARDIA)    Difficulty of Paying Living Expenses: Not hard at all  Food Insecurity: No Food Insecurity (02/13/2020)   Hunger Vital Sign    Worried About Running Out of Food in the Last Year: Never true    Ran Out of Food in the Last Year: Never true  Transportation Needs: No Transportation Needs (02/13/2020)   PRAPARE - Administrator, Civil Service (Medical): No    Lack of Transportation (Non-Medical): No  Physical Activity: Inactive (02/13/2020)   Exercise Vital Sign    Days of Exercise per Week: 0 days    Minutes of Exercise per Session: 0 min  Stress: No Stress Concern Present (02/13/2020)   [employer] of Occupational Health - Occupational Stress Questionnaire    Feeling of Stress : Not at all  Social Connections: Socially Isolated (02/13/2020)   Social Connection and Isolation Panel [NHANES]    Frequency of Communication with Friends and Family: More than three times a week    Frequency of Social Gatherings with Friends and Family: Once a week  Attends Religious Services: Never    Active Member of Clubs or Organizations: No    Attends Banker Meetings: Never    Marital Status: Widowed   Family History  Problem Relation Age of Onset   Cancer Mother    Allergies  Allergen Reactions   Januvia [Sitagliptin] Other (See Comments)    dizziness   Statins Other (See Comments)    Hospitalized on 2 occasions after trying statins. Patient became dizzy and increased fall risk   Sulfa Antibiotics Other (See Comments)    Childhood Allergy  Unknown reaction   Prior to Admission medications   Medication Sig Start Date End Date Taking?  Authorizing Provider  acetaminophen (TYLENOL) 325 MG tablet Take 650 mg by mouth every 6 (six) hours as needed for mild pain or headache. 06/13/20  Yes [provider]  amLODipine (NORVASC) 5 MG tablet TAKE 1 TABLET(5 MG) BY MOUTH DAILY Patient taking differently: Take 5 mg by mouth daily. 08/28/21  Yes Ngetich, Dinah C, NP  camphor-menthol (SARNA) lotion Apply topically as needed for itching. Patient taking differently: Apply 1 application  topically daily as needed for itching. Camphor - Menthol External Lotion 0.5-0.5% 12/11/21  Yes Setzer, Lynnell Jude, PA-C  cefadroxil (DURICEF) 500 MG capsule Take 2 capsules (1,000 mg total) by mouth 2 (two) times daily for 7 days. 02/26/22 03/05/22 Yes Almon Hercules, MD  clonazePAM (KLONOPIN) 0.5 MG tablet Take 1 tablet (0.5 mg total) by mouth daily as needed for anxiety. Patient taking differently: Take 0.5 mg by mouth every 12 (twelve) hours as needed for anxiety. 02/15/22 02/15/23 Yes Pahwani, Daleen Bo, MD  clopidogrel (PLAVIX) 75 MG tablet Take 1 tablet (75 mg total) by mouth daily. 02/19/22  Yes Pahwani, Daleen Bo, MD  docusate sodium (COLACE) 100 MG capsule Take 1 capsule (100 mg total) by mouth 2 (two) times daily. 02/09/22  Yes Pokhrel, Laxman, MD  fenofibrate 54 MG tablet TAKE 1 TABLET(54 MG) BY MOUTH DAILY Patient taking differently: Take 54 mg by mouth daily. 01/04/22  Yes Ngetich, Dinah C, NP  FLUoxetine (PROZAC) 40 MG capsule Take one capsule by mouth once daily. Patient taking differently: Take 40 mg by mouth daily. 01/04/22  Yes Ngetich, Dinah C, NP  lidocaine 4 % Place 1 patch onto the skin daily.   Yes [provider]  melatonin 10 MG TABS Take 10 mg by mouth at bedtime. 12/11/21  Yes Setzer, Lynnell Jude, PA-C  metFORMIN (GLUCOPHAGE) 500 MG tablet TAKE 1 TABLET(500 MG) BY MOUTH DAILY WITH BREAKFAST Patient taking differently: Take 500 mg by mouth daily with breakfast. 01/13/22  Yes Ngetich, Dinah C, NP  naloxone (NARCAN) 0.4 MG/ML injection Inject 1 mL  (0.4 mg total) into the vein as needed. 02/26/22  Yes Gonfa, Boyce Medici, MD  NUEDEXTA 20-10 MG capsule Take 1 capsule by mouth 2 (two) times daily. X 7 days 03/01/22  Yes [provider]  Nutritional Supplements (NUTRITIONAL SUPPLEMENT PO) Take 1 Dose by mouth 2 (two) times daily. Med Pass 2.0   Yes [provider]  ondansetron (ZOFRAN) 4 MG tablet Take 1 tablet (4 mg total) by mouth every 6 (six) hours as needed for nausea. 02/09/22  Yes Pokhrel, Laxman, MD  oxyCODONE (ROXICODONE) 5 MG immediate release tablet Take 1 tablet (5 mg total) by mouth every 4 (four) hours as needed for up to 7 days for severe pain. 02/25/22 03/04/22 Yes Hill, Alain Honey, PA-C  oxymetazoline (AFRIN NASAL SPRAY) 0.05 % nasal spray Spray 1 spray  into affected nostril if bleeding is uncontrolled after 20 min of direct pressure Patient taking differently: Place 1 spray into both nostrils daily as needed (nose bleed uncontrolled after 20 minutes, apply direct pressure.). 12/18/21  Yes Raynald Blendonklin, Erica R, PA-C  senna-docusate (SENOKOT-S) 8.6-50 MG tablet Take 1 tablet by mouth 2 (two) times daily as needed for moderate constipation. 02/26/22  Yes Gonfa, Boyce Mediciaye T, MD  sodium chloride 1 g tablet TAKE 1 TABLET(1 GRAM) BY MOUTH THREE TIMES DAILY Patient taking differently: Take 1 g by mouth 3 (three) times daily. 11/30/21  Yes Ngetich, Dinah C, NP  sodium phosphate (FLEET) 7-19 GM/118ML ENEM Place 133 mLs (1 enema total) rectally daily as needed for severe constipation. 02/26/22  Yes Almon HerculesGonfa, Taye T, MD  vitamin B-12 1000 MCG tablet Take 1 tablet (1,000 mcg total) by mouth daily. 02/27/22  Yes Almon HerculesGonfa, Taye T, MD  hydrOXYzine (ATARAX) 10 MG tablet TAKE 1 TABLET(10 MG) BY MOUTH EVERY 8 HOURS AS NEEDED FOR ANXIETY Patient not taking: Reported on 03/03/2022 01/19/22   Ngetich, Dinah C, NP  QUEtiapine (SEROQUEL) 25 MG tablet Take 1 tablet (25 mg total) by mouth at bedtime as needed (agitation/sundowning). Patient not taking: Reported on 02/15/2022  02/09/22   Joycelyn DasPokhrel, Laxman, MD   CT FEMUR RIGHT W CONTRAST  Result Date: 03/03/2022 CLINICAL DATA:  Soft tissue infection suspected, thigh, xray done R thigh swelling. recent hip revision surgery r/o osteo vs fracture vs soft swelling infection EXAM: CT OF THE LOWER RIGHT EXTREMITY WITH CONTRAST TECHNIQUE: Multidetector CT imaging of the lower right extremity was performed according to the standard protocol following intravenous contrast administration. RADIATION DOSE REDUCTION: This exam was performed according to the departmental dose-optimization program which includes automated exposure control, adjustment of the mA and/or kV according to patient size and/or use of iterative reconstruction technique. CONTRAST:  100mL OMNIPAQUE IOHEXOL 300 MG/ML  SOLN COMPARISON:  None Available. FINDINGS: Bones/Joint/Cartilage Postsurgical changes of recent arthroplasty revision with new long stem right hip arthroplasty and cerclage wire fixation for a periprosthetic fracture. Near anatomic fracture alignment. Intact hardware without evidence of loosening. There is no bony erosion or frank bony destruction. Probable trace joint effusion. There is tricompartment osteoarthritis of the knee with severe medial compartment joint space narrowing. Small right knee joint effusion. Ligaments Suboptimally assessed by CT. Muscles and Tendons There is a fluid collection on the anterior compartment of the thigh, tracking towards the hip joint. This communicates with a superficial rim enhancing collection along the anterior proximal thigh which measures up to 8.6 x 4.0 x 16.3 cm (series 7, image 59, series 12, image 61). Soft tissues Posterolateral incision with underlying coalescent fluid along the superficial surface of the IT band, extending from the hip to the level of the distal femur, measuring 4.3 x 2.3 cm. This demonstrates no clear rim enhancement. There is extensive skin thickening and subcutaneous soft tissue swelling of the  right thigh. IMPRESSION: Postsurgical changes of recent right hip revision with long stem arthroplasty and cerclage wire fixation for a periprosthetic fracture. Near anatomic fracture alignment. Intact hardware without evidence of loosening. Extensive skin thickening and subcutaneous soft tissue swelling of the thigh, as can be seen in cellulitis. Large superficial fluid collection with rim enhancement along the anterior proximal thigh measuring 8.6 x 4.0 cm in the axial plane and up to 6.3 cm and proximal-distal extent, with probable communicating fluid coursing deep towards the right hip joint, with punctate foci of gas. This could represent abscess or postoperative seroma/hematoma.  Posterolateral incision with additional underlying coalescent fluid along the majority of the IT band, measuring 4.3 x 2.3 cm in the axial plane, without clear rim enhancement. Differential is the same as above but felt less likely to be infectious given no definitive rim enhancement. 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 Small right knee joint effusion. Electronically Signed   By: Caprice Renshaw M.D.   On: 03/03/2022 17:15   CT HEAD WO CONTRAST ( )  Result Date: 03/03/2022 CLINICAL DATA:  Mental status change, unknown cause EXAM: CT HEAD WITHOUT CONTRAST TECHNIQUE: Contiguous axial images were obtained from the base of the skull through the vertex without intravenous contrast. RADIATION DOSE REDUCTION: This exam was performed according to the departmental dose-optimization program which includes automated exposure control, adjustment of the mA and/or kV according to patient size and/or use of iterative reconstruction technique. COMPARISON:  CT head 02/23/2022. FINDINGS: Brain: Redemonstrated left posterior MCA territory infarct with encephalomalacia predominantly in the left parietal and temporal lobes. Additional patchy white matter hypoattenuation, nonspecific but compatible with chronic  microvascular ischemic disease. No evidence of acute large vascular territory infarct, acute hemorrhage, mass lesion midline shift, or hydrocephalus. Cerebral atrophy. Vascular: No hyperdense vessel identified. Calcific atherosclerosis. Skull: No acute fracture. Sinuses/Orbits: Clear sinuses.  No acute orbital findings. Other: No mastoid effusions. IMPRESSION: 1. No evidence of acute intracranial abnormality. 2. Remote left MCA territory infarct and chronic microvascular ischemic disease. Electronically Signed   By: Feliberto Harts M.D.   On: 03/03/2022 16:59   DG Chest Port 1 View  Result Date: 03/03/2022 CLINICAL DATA:  Altered mental status. EXAM: PORTABLE CHEST 1 VIEW COMPARISON:  02/22/2022 FINDINGS: Numerous leads and wires project over the chest. Midline trachea. Borderline cardiomegaly. The Chin overlies the apices minimally. No pleural effusion or pneumothorax. Clear lungs. IMPRESSION: No acute cardiopulmonary disease. Electronically Signed   By: Jeronimo Greaves M.D.   On: 03/03/2022 16:15    ROS and physical exam limited due to aphasia.   Physical Exam: General: Woke up patient for exam this morning. Alert, no acute distress Respiratory: No cyanosis, no use of accessory musculature GI: No organomegaly, abdomen is soft and non-tender Psychiatric: Aphasia.  Lymphatic: No axillary or cervical lymphadenopathy  MUSCULOSKELETAL: Examination of the right anterior hip incision reveals a well healing incision. Steri-strips removed. She has improved erythema from when she was last admitted and continued healing of anterior incision.   Examination of the right posterior incision reveals staples and a well healing incision. No wound dehiscence or drainage noted. Aquacel dressings reapplied.   Mild swelling in right leg from thigh to foot. She currently has ice over right hip.   Sensation difficult to assess due to aphasia. She does move her toes and foot on her own during examination. No  significant calf tenderness. Palpable pedal pulses.   Assessment: Status post revision of femoral component right total hip arthroplasty from right hip periprosthetic femur fracture.   Plan: Her incisions look great. Her anterior incision is healing well and has improved erythema since last admission. Her posterior incision looks great. She has mild swelling in right thigh consistent with postoperative swelling. No evidence of VTE or infection concerns. CT shows expected postoperative findings. No plans for intervention at this time. She can continue her Plavix. She is to remain 50% weightbearing in RLE with walker. Posterior hip precautions. Work on pain control. Continue regular ice and elevation of right lower extremity for pain and swelling.    Alain Honey  [place], PA-C   03/04/2022 12:19 PM

## 2022-03-04 NOTE — Assessment & Plan Note (Signed)
-   Continue amlodipine ?

## 2022-03-04 NOTE — Assessment & Plan Note (Addendum)
With hyperglycemia.  Placed on sensitive sliding scale ACHS

## 2022-03-04 NOTE — Assessment & Plan Note (Signed)
BMI of 37 

## 2022-03-04 NOTE — Assessment & Plan Note (Signed)
Repleted with IV potassium 10 mEq x 2

## 2022-03-04 NOTE — Discharge Instructions (Addendum)
-  50% WB RLE with walker -posterior hip precautions -chemical DVT ppx held due to GIB -PO pain control -F/u in the office w/ Dr. Linna Caprice 7/24 or 7/25. Call [phone] to schedule.

## 2022-03-04 NOTE — Plan of Care (Signed)
  Problem: Clinical Measurements: Goal: Cardiovascular complication will be avoided Outcome: Progressing   Problem: Activity: Goal: Risk for activity intolerance will decrease Outcome: Progressing   Problem: Elimination: Goal: Will not experience complications related to bowel motility Outcome: Progressing   Problem: Safety: Goal: Ability to remain free from injury will improve Outcome: Progressing   Problem: Skin Integrity: Goal: Risk for impaired skin integrity will decrease Outcome: Progressing   Problem: Pain Managment: Goal: General experience of comfort will improve Outcome: Progressing

## 2022-03-04 NOTE — Assessment & Plan Note (Addendum)
-  Seroma versus abscess S/p underwent revision of right THA by Dr. Linna Caprice on 02/24/2022 following a mechanical fall. -Presents with worsening pain of right hip and repeat CT femur today showed extensive skin thickening and subcutaneous soft tissue swelling of the thigh likely cellulitis.  There is also large superficial fluid collection along the anterior proximal thigh that could be abscess or postoperative seroma/hematoma. There is also coalescent fluid along the posterior lateral incision and the majority of the IT band with a similar differential as above.   -ED physician discussed with orthopedic Dr Odis Hollingshead and given the lack of fever or leukocytosis doubt infection but agree with continuing IV antibiotics and they will see in consultation in the morning.  No definitive plans for intervention at this time but will hold Plavix overnight pending final recommendation. -Continue IV vancomycin and cefepime.

## 2022-03-05 DIAGNOSIS — G8918 Other acute postprocedural pain: Secondary | ICD-10-CM

## 2022-03-05 DIAGNOSIS — Z8673 Personal history of transient ischemic attack (TIA), and cerebral infarction without residual deficits: Secondary | ICD-10-CM | POA: Diagnosis not present

## 2022-03-05 DIAGNOSIS — F03911 Unspecified dementia, unspecified severity, with agitation: Secondary | ICD-10-CM | POA: Diagnosis not present

## 2022-03-05 DIAGNOSIS — T8149XA Infection following a procedure, other surgical site, initial encounter: Secondary | ICD-10-CM | POA: Diagnosis not present

## 2022-03-05 DIAGNOSIS — Z66 Do not resuscitate: Secondary | ICD-10-CM

## 2022-03-05 DIAGNOSIS — R4701 Aphasia: Secondary | ICD-10-CM | POA: Diagnosis not present

## 2022-03-05 LAB — CBC
HCT: 24.9 % — ABNORMAL LOW (ref 36.0–46.0)
Hemoglobin: 7.9 g/dL — ABNORMAL LOW (ref 12.0–15.0)
MCH: 26.1 pg (ref 26.0–34.0)
MCHC: 31.7 g/dL (ref 30.0–36.0)
MCV: 82.2 fL (ref 80.0–100.0)
Platelets: 476 10*3/uL — ABNORMAL HIGH (ref 150–400)
RBC: 3.03 MIL/uL — ABNORMAL LOW (ref 3.87–5.11)
RDW: 17.5 % — ABNORMAL HIGH (ref 11.5–15.5)
WBC: 9.4 10*3/uL (ref 4.0–10.5)
nRBC: 0 % (ref 0.0–0.2)

## 2022-03-05 LAB — BASIC METABOLIC PANEL
Anion gap: 8 (ref 5–15)
BUN: 6 mg/dL — ABNORMAL LOW (ref 8–23)
CO2: 26 mmol/L (ref 22–32)
Calcium: 8.5 mg/dL — ABNORMAL LOW (ref 8.9–10.3)
Chloride: 106 mmol/L (ref 98–111)
Creatinine, Ser: 0.38 mg/dL — ABNORMAL LOW (ref 0.44–1.00)
GFR, Estimated: >60 mL/min (ref >60)
Glucose, Bld: 112 mg/dL — ABNORMAL HIGH (ref 70–99)
Potassium: 3 mmol/L — ABNORMAL LOW (ref 3.5–5.1)
Sodium: 140 mmol/L (ref 135–145)

## 2022-03-05 LAB — GLUCOSE, CAPILLARY
Glucose-Capillary: 124 mg/dL — ABNORMAL HIGH (ref 70–99)
Glucose-Capillary: 130 mg/dL — ABNORMAL HIGH (ref 70–99)
Glucose-Capillary: 136 mg/dL — ABNORMAL HIGH (ref 70–99)
Glucose-Capillary: 152 mg/dL — ABNORMAL HIGH (ref 70–99)

## 2022-03-05 LAB — MAGNESIUM: Magnesium: 1.6 mg/dL — ABNORMAL LOW (ref 1.7–2.4)

## 2022-03-05 MED ORDER — ONDANSETRON 4 MG PO TBDP: 4.0000 mg | ORAL_TABLET | Freq: Four times a day (QID) | ORAL | Status: DC | PRN

## 2022-03-05 MED ORDER — LORAZEPAM 2 MG/ML IJ SOLN
1.0000 mg | INTRAMUSCULAR | Status: DC
  Administered 2022-03-05 – 2022-03-06 (×4): 2 mg via INTRAVENOUS
  Filled 2022-03-05 (×5): qty 1

## 2022-03-05 MED ORDER — MORPHINE SULFATE (CONCENTRATE) 10 MG/0.5ML PO SOLN
5.0000 mg | ORAL | Status: DC | PRN
  Administered 2022-03-06 (×3): 5 mg via ORAL
  Filled 2022-03-05 (×3): qty 0.5

## 2022-03-05 MED ORDER — BIOTENE DRY MOUTH MT LIQD: 15.0000 mL | OROMUCOSAL | Status: DC | PRN

## 2022-03-05 MED ORDER — HALOPERIDOL LACTATE 2 MG/ML PO CONC: 0.5000 mg | ORAL | Status: DC | PRN

## 2022-03-05 MED ORDER — POTASSIUM CHLORIDE 10 MEQ/100ML IV SOLN: 10.0000 meq | INTRAVENOUS | Status: DC

## 2022-03-05 MED ORDER — GLYCOPYRROLATE 0.2 MG/ML IJ SOLN: 0.2000 mg | INTRAMUSCULAR | Status: DC | PRN

## 2022-03-05 MED ORDER — HALOPERIDOL LACTATE 5 MG/ML IJ SOLN: 0.5000 mg | INTRAMUSCULAR | Status: DC | PRN

## 2022-03-05 MED ORDER — POTASSIUM CHLORIDE CRYS ER 20 MEQ PO TBCR
40.0000 meq | EXTENDED_RELEASE_TABLET | ORAL | Status: AC
  Administered 2022-03-05 (×2): 40 meq via ORAL
  Filled 2022-03-05 (×2): qty 2

## 2022-03-05 MED ORDER — HALOPERIDOL 0.5 MG PO TABS
0.5000 mg | ORAL_TABLET | ORAL | Status: DC | PRN
  Administered 2022-03-06 (×2): 0.5 mg via ORAL
  Filled 2022-03-05 (×3): qty 1

## 2022-03-05 MED ORDER — MAGNESIUM SULFATE 2 GM/50ML IV SOLN
2.0000 g | Freq: Once | INTRAVENOUS | Status: AC
Start: 2022-03-05 — End: 2022-03-05
  Administered 2022-03-05: 2 g via INTRAVENOUS
  Filled 2022-03-05: qty 50

## 2022-03-05 MED ORDER — ONDANSETRON HCL 4 MG/2ML IJ SOLN: 4.0000 mg | Freq: Four times a day (QID) | INTRAMUSCULAR | Status: DC | PRN

## 2022-03-05 MED ORDER — ACETAMINOPHEN 325 MG PO TABS
650.0000 mg | ORAL_TABLET | Freq: Four times a day (QID) | ORAL | Status: DC | PRN
  Administered 2022-03-07 (×2): 650 mg via ORAL
  Filled 2022-03-05 (×2): qty 2

## 2022-03-05 MED ORDER — GLYCOPYRROLATE 1 MG PO TABS: 1.0000 mg | ORAL_TABLET | ORAL | Status: DC | PRN

## 2022-03-05 MED ORDER — POLYVINYL ALCOHOL 1.4 % OP SOLN: 1.0000 [drp] | Freq: Four times a day (QID) | OPHTHALMIC | Status: DC | PRN

## 2022-03-05 MED ORDER — MORPHINE SULFATE (PF) 2 MG/ML IV SOLN: 1.0000 mg | INTRAVENOUS | Status: DC | PRN

## 2022-03-05 MED ORDER — DOCUSATE SODIUM 100 MG PO CAPS
100.0000 mg | ORAL_CAPSULE | Freq: Two times a day (BID) | ORAL | Status: DC | PRN
Start: 2022-03-05 — End: 2022-03-08

## 2022-03-05 MED ORDER — ACETAMINOPHEN 650 MG RE SUPP: 650.0000 mg | Freq: Four times a day (QID) | RECTAL | Status: DC | PRN

## 2022-03-05 NOTE — Evaluation (Signed)
Occupational Therapy Evaluation Patient Details Name: Rebecca Barnes MRN: [id number] DOB: Feb 27, 1946 Today's Date: 03/05/2022   History of Present Illness 76 y.o. female  just admitted from 02/22/2022-02/26/2022 for right hip periprosthetic hip fracture s/p fall. She underwent revision of right THA by Dr. Linna Caprice on 02/24/2022.worsening pain of right hip and repeat CT femur  showed extensive skin thickening and subcutaneous soft tissue swelling of the thigh. There is also large superficial fluid collection along the anterior proximal thigh that could be abscess or postoperative seroma/hematoma and  coalescent fluid along the posterior lateral incision and the majority of the IT band with a similar differential as above.    no intervention per ortho.   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   Clinical Impression   [name] is a 76 year old woman who has had multiple hospitalizations in the last two months as well as a CVA in April. Her daughter is present and reports after CVA patient has returned home and was mostly Modified independent with supervision but with lingering cognitive deficits. She has been back and forth from rehab facility due to hip fracture and complications. On evaluation she was initially asleep and hard to arouse without a lot of tactile stimulation. She appeared confused. She is very hard to hear when she attempts to speak so unsure of how much her speech is intelligible. She is able to follow some simple commands with visual or tactile cues. She is hard to communicate with. She becomes emotional and begins to cry but can be redirected. She was mod x 2 to perform bed transfers and needs significant assistance for ADLs. Deferred standing due to safety due to patient not following commands consistently and her difficulty with emotional regulation. Patient's daughter reports  she was making small progress at rehab but then she would end up in hospital resulting in a backtrack. She reports patient's cognitive has declined with each hospitalization. Recommend patient return to facility to continue therapy and maximize patient's functional abilities to reduce caregiver burden.      Recommendations for follow up therapy are one component of a multi-disciplinary discharge planning process, led by the attending physician.  Recommendations may be updated based on patient status, additional functional criteria and insurance authorization.   Follow Up Recommendations  Skilled nursing-short term rehab (<3 hours/day)    Assistance Recommended at Discharge Frequent or constant Supervision/Assistance  Patient can return home with the following Direct supervision/assist for medications management;Direct supervision/assist for financial management;Help with stairs or ramp for entrance;Assist for transportation;Two people to help with walking and/or transfers;Assistance with feeding;Assistance with cooking/housework;A lot of help with bathing/dressing/bathroom    Functional Status Assessment  Patient has had a recent decline in their functional status and/or demonstrates limited ability to make significant improvements in function in a reasonable and predictable amount of time  Equipment Recommendations  Other (comment) (defer)    Recommendations for Other Services       Precautions / Restrictions Precautions Precautions: Fall;Posterior Hip Precaution Comments: R visual deficits, aphasia, cognitive deficits Restrictions Weight Bearing Restrictions: Yes RLE Weight Bearing: Partial weight bearing RLE Partial Weight Bearing Percentage or Pounds: 50      Mobility Bed Mobility   Bed Mobility: Supine to Sit, Sit to Supine Rolling: Mod assist, +2 for physical assistance   Supine to sit: Mod assist, +2 for physical assistance, +2 for safety/equipment     General bed  mobility comments: Able to transfer to edge of bed today with mod x 2 physical assistance. patient able to assist more with upper body and reaching for rails.    Transfers                          Balance Overall balance assessment: Needs assistance Sitting-balance support: No upper extremity supported, Feet supported Sitting balance-Leahy Scale: Fair Sitting balance - Comments: at times leans back posteriorly. Requires very close guard for safety Postural control: Posterior lean                                 ADL either performed or assessed with clinical judgement   ADL Overall ADL's : Needs assistance/impaired Eating/Feeding: Set up;Sitting Eating/Feeding Details (indicate cue type and reason): drink from cup. Grooming: Set up;Wash/dry face;Sitting   Upper Body Bathing: Maximal assistance;Bed level   Lower Body Bathing: Total assistance;Bed level   Upper Body Dressing : Maximal assistance;Bed level   Lower Body Dressing: Total assistance;Bed level     Toilet Transfer Details (indicate cue type and reason): deferred Toileting- Clothing Manipulation and Hygiene: Total assistance;Bed level       Functional mobility during ADLs: Moderate assistance;+2 for physical assistance General ADL Comments: Mod x 2 to transfer to edge of bed. Due to difficulty with following commands and patient's fear and anxiety - deferred standing for now.     Vision Ability to See in Adequate Light: 1 Impaired Patient Visual Report:  (Right visual field deficit from CVA)       Perception     Praxis      Pertinent Vitals/Pain Pain Assessment Pain Assessment: PAINAD Breathing: normal Negative Vocalization: occasional moan/groan, low speech, negative/disapproving quality Facial Expression: sad, frightened, frown Body Language: relaxed Consolability: distracted or reassured by voice/touch PAINAD Score: 3     Hand Dominance Right   Extremity/Trunk Assessment  Upper Extremity Assessment Upper Extremity Assessment: RUE deficits/detail;LUE deficits/detail RUE Deficits / Details: WFL ROM, unable to check strength LUE Deficits / Details: WFL ROM, unable to check strength   Lower Extremity Assessment Lower Extremity Assessment: Defer to PT evaluation RLE Deficits / Details: pt allows partial hip and knee AAROM. strength 2/5, limited by pain RLE: Unable to fully assess due to pain LLE Deficits / Details: AROM grossly WFL, strength ~ 2+ to 3/5 grossly   Cervical / Trunk Assessment Cervical / Trunk Assessment: Normal   Communication Communication Communication: Expressive difficulties   Cognition Arousal/Alertness: Awake/alert Behavior During Therapy: Anxious Overall Cognitive Status: Difficult to assess Area of Impairment: Orientation, Attention, Memory, Following commands, Safety/judgement, Awareness, Problem solving                 Orientation Level:  (unsure) Current Attention Level: Focused Memory:  (unsure) Following Commands: Follows one step commands inconsistently Safety/Judgement: Decreased awareness of deficits, Decreased awareness of safety Awareness: Intellectual Problem Solving: Slow processing, Difficulty sequencing, Requires verbal cues, Requires tactile cues General Comments: Able to answer a couple of anwers appropriately like nod her head yes when she wanted something to drink. Able to follow a couple of commands - better with visual and tactile cue     General Comments       Exercises     Shoulder Instructions      Home Living Family/patient expects to be discharged to:: [place]: Children;Other (Comment) Available Help at Discharge:  Family;Personal care attendant;Available 24 hours/day Type of Home: House Home Access: Ramped entrance     Home Layout: Two level;Full bath on main level;Able to live on main level with bedroom/bathroom     Bathroom Shower/Tub: Curator Toilet: Handicapped height     Home Equipment: Agricultural consultant (2 wheels);Shower seat;Grab bars - toilet;Grab bars - tub/shower   Additional Comments: From Prior Admission.      Prior Functioning/Environment Prior Level of Function : Needs assist       Physical Assist : Mobility (physical);ADLs (physical) Mobility (physical): Bed mobility;Transfers;Gait ADLs (physical): Bathing;Toileting;Dressing;Grooming;Feeding;IADLs Mobility Comments: Patient needing assistance since initial surgery. Mobility limited due to multiple hospital admissions. ADLs Comments: Needs assistance with all ADLs. Prior to initial fall patient Mid I.        OT Problem List: Decreased strength;Decreased activity tolerance;Impaired balance (sitting and/or standing);Decreased cognition;Decreased safety awareness;Decreased knowledge of use of DME or AE;Pain;Impaired vision/perception;Decreased knowledge of precautions;Obesity;Increased edema      OT Treatment/Interventions: Self-care/ADL training;Therapeutic exercise;Energy conservation;Therapeutic activities;Patient/family education;Balance training;DME and/or AE instruction;Neuromuscular education;Cognitive remediation/compensation    OT Goals(Current goals can be found in the care plan section) Acute Rehab OT Goals OT Goal Formulation: Patient unable to participate in goal setting Time For Goal Achievement: 03/19/22 Potential to Achieve Goals: Fair  OT Frequency: Min 2X/week    Co-evaluation              AM-PAC OT "6 Clicks" Daily Activity     Outcome Measure Help from another person eating meals?: A Little Help from another person taking care of personal grooming?: A Little Help from another person toileting, which includes using toliet, bedpan, or urinal?: Total Help from another person bathing (including washing, rinsing, drying)?: A Lot Help from another person to put on and taking off regular upper body clothing?: A Lot Help  from another person to put on and taking off regular lower body clothing?: Total 6 Click Score: 12   End of Session Nurse Communication: Mobility status  Activity Tolerance: Other (comment) (fear/emotional) Patient left: in bed;with call bell/phone within reach;with bed alarm set;with family/visitor present  OT Visit Diagnosis: Other abnormalities of gait and mobility (R26.89);Muscle weakness (generalized) (M62.81);Pain;Other symptoms and signs involving cognitive function;Cognitive communication deficit (R41.841);Low vision, both eyes (H54.2)                Time: 2951-8841 OT Time Calculation (min): 29 min Charges:  OT General Charges $OT Visit: 1 Visit OT Evaluation $OT Eval Low Complexity: 1 Low OT Treatments $Self Care/Home Management : 8-22 mins  Mendy Lapinsky, OTR/L Acute Care Rehab Services  Office [phone] Pager: [phone]   Kelli Churn 03/05/2022, 4:47 PM

## 2022-03-05 NOTE — Plan of Care (Signed)
?  Problem: Clinical Measurements: ?Goal: Will remain free from infection ?Outcome: Progressing ?  ?

## 2022-03-05 NOTE — Progress Notes (Signed)
Palliative Medicine Progress Note   Patient Name: Rebecca Barnes       Date: 03/05/2022 DOB: [birth year]  Age: 76 y.o. MRN#: [id number] Attending Physician: Geradine Girt, DO Primary Care Physician: Sandrea Hughs, NP Admit Date: 03/03/2022   HPI/Patient Profile:  76 y.o. female  with past medical history of dementia, CVA with residual aphasia, type 2 diabetes, hypertension, and anemia who presented to [facility] ED on 03/03/2022 with worsening pain of right hip.    Patient was recently admitted 02/22/2022 through 02/26/2022 for right hip periprosthetic hip fracture status post fall.  She underwent revision of right THA by Dr. Lyla Glassing on 02/24/2022.  CT femur done in the ED showed extensive skin thickening and subcutaneous soft tissue swelling of the thigh suspicious for cellulitis.  There is also a large superficial fluid collection along the anterior proximal thigh that could be abscess or postoperative seroma/hematoma.   Patient seen by ortho, who felt that patient had mild expected postop swelling to thigh.   Subjective: I went to see patient at bedside.  She seems to be much calmer with her family present but still appears quite anxious.  I met with her daughters [name] and Lattie Haw at length and the family waiting area.  We reviewed Mrs. Witzke's course over the past few months.  They report that she was having frequent falls for quite some time.  We reviewed her decline since the stroke in April.  We reviewed the recent fracture that occurred due to following, and the need for rehospitalization due to complications.  We discussed that patients do not usually return to previous baseline after a major hospitalization.  Reviewed that hip fractures can be devastating events for patients with advanced age  and cognitive impairment.  Reviewed concerns about quality of life. The difference between full scope medical intervention and comfort care was considered.  I introduced the concept of a comfort path to family, emphasizing that this path involves de-escalating and stopping full scope medical interventions, allowing a natural course to occur. Discussed that the goal is comfort and dignity rather than cure/prolonging life. Encouraged family to consider at what point they would want to stop full scope medical interventions, keeping in mind the concept of quality of life. Introduced hospice philosophy and provided information on home versus residential hospice services - answered  all questions.   Daughters are understandably emotional during this discussion. [name] is struggling with feeling that this happened "so fast" and that she was "fine" a few months ago.  We discussed that patient's sometimes decline in the setting of devastating events such as a major stroke followed by a hip fracture.  After further discussion and consideration, daughters state that their mother "would not want to live like this".  A are clear that they are ready to transition to full comfort care at this time. Discussed the specifics of comfort care--keeping her clean and dry, no labs, no artificial hydration or feeding, no antibiotics, minimizing of medications, comfort feeds, and medications for symptom management.  Daughters are clear that they want more aggressive management of symptoms such as pain and anxiety.  Discussed the option to transfer to a residential hospice facility for a more peaceful setting at end-of-life.  Daughter states her preference for [facility].  Provided education and counseling on natural trajectory at end-of-life.  Emotional support provided.    Objective:  Physical Exam Vitals reviewed.  Constitutional:      General: She is not in acute distress.    Appearance: She is ill-appearing.  Pulmonary:      Effort: Pulmonary effort is normal.  Neurological:     Mental Status: She is alert. She is confused.     Motor: Weakness present.  Psychiatric:        Mood and Affect: Mood is anxious.             Vital Signs: BP (!) 122/53 (BP Location: Left Arm)   Pulse 79   Temp 98.1 F (36.7 C)   Resp 18   Ht _0  (1.651 m)   Wt 100.9 kg   SpO2 96%   BMI 37.02 kg/m  SpO2: SpO2: 96 % O2 Device: O2 Device: Room Air O2 Flow Rate:      LBM: Last BM Date : 03/04/22     Palliative Assessment/Data: PPS 20%     Palliative Medicine Assessment & Plan   Assessment: Principal Problem:   Postoperative abscess Active Problems:   Type 2 diabetes mellitus without complication, without long-term current use of insulin (HCC)   Essential hypertension   Hypokalemia   History of CVA (cerebrovascular accident)   Expressive aphasia   Morbid obesity ([place])   Pain   Hip pain    Recommendations/Plan: Full comfort measures initiated DNR/DNI as previously documented Transfer to [facility] when bed becomes available - TOC consult placed Added orders for symptom management at EOL as well as discontinued orders that were not focused on comfort Start scheduled Ativan 1 to 2 mg IV every 4 hours PMT will continue to follow   Symptom Management:  Morphine prn for pain or dyspnea Lorazepam (ATIVAN) prn for anxiety Haloperidol (HALDOL) prn for agitation  Glycopyrrolate (ROBINUL) for excessive secretions Ondansetron (ZOFRAN) prn for nausea Polyvinyl alcohol (LIQUIFILM TEARS) prn for dry eyes Antiseptic oral rinse (BIOTENE) prn for dry mouth  Prognosis:  < 2 weeks   Care plan was discussed with Dr. Eliseo Squires via secure chat  Thank you for allowing the Palliative Medicine Team to assist in the care of this patient.   Greater than 50%  of this time was spent counseling and coordinating care related to the above assessment and plan.  Total time: 63 minutes   Lavena Bullion, NP Palliative  Medicine   Please contact Palliative Medicine Team phone at [phone] for questions and concerns.  For  individual provider, see AMION.

## 2022-03-05 NOTE — Consult Note (Signed)
Palliative Care Consult Note                                  Date: 03/05/2022   Rebecca Barnes Name: Rebecca Barnes  DOB: [birth year]  MRN: [id number]  Age / Sex: 76 y.o., female  PCP: Ngetich, Donalee Citrin, NP Referring Physician: Joseph Art, DO  Reason for Consultation: Establishing goals of care  HPI/Rebecca Barnes Profile: 76 y.o. female  with past medical history of dementia, CVA with residual aphasia, type 2 diabetes, hypertension, and anemia who presented to [facility] ED on 03/03/2022 with worsening pain of right hip.   Rebecca Barnes was recently admitted 02/22/2022 through 02/26/2022 for right hip periprosthetic hip fracture status post fall.  She underwent revision of right THA by Dr. Linna Caprice on 02/24/2022.  CT femur done in the ED showed extensive skin thickening and subcutaneous soft tissue swelling of the thigh suspicious for cellulitis.  There is also a large superficial fluid collection along the anterior proximal thigh that could be abscess or postoperative seroma/hematoma.  Rebecca Barnes seen by ortho, who felt that Rebecca Barnes had mild expected postop swelling to thigh.  Past Medical History:  Diagnosis Date   Anxiety    Asthma    Diabetes mellitus without complication (HCC)    Hypertension    Stroke [facility])     Subjective:   I have reviewed medical records including EPIC notes, labs and imaging, and assessed the Rebecca Barnes at bedside.  She is yelling out loudly; it can be heard down the hall.  She continues to yell out while I am in the room.  I asked if she was in pain, but she is not able to really verbalize or answer any questions.  I spoke with her daughter [name] by phone to discuss diagnosis, prognosis, GOC, EOL wishes, disposition, and options.  I introduced Palliative Medicine as specialized medical care for people living with serious illness. It focuses on providing relief from the symptoms and stress of a serious illness.   Created space and  opportunity for Rebecca Barnes and family to explore thoughts and feelings regarding current medical situation. Values and goals of care important to Rebecca Barnes and family were attempted to be elicited.  Life Review: [name] describes Rebecca Barnes as a "hippie", a generous person, and a wonderful mother.  She has 2 daughters, [name] and Misty Stanley.  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  Her husband passed away from heart failure about 6 years ago.  Functional Status: Rebecca Barnes's functional status has significantly declined since her stroke in April.  Prior to that, Rebecca Barnes was living with [name] and "doing well" according to her daughter.  She was ambulatory, able to bathe herself, and even able to cook once a week.    Rebecca Barnes Values: Her family and her independence  Goals: To be determined  Discussion: We discussed Rebecca Barnes's current illness and what it means in the larger context of her ongoing co-morbidities. Current clinical status was reviewed. Natural disease trajectory of chronic illness was discussed.  We also reviewed that Rebecca Barnes also likely has dementia, which is a noncurable and progressive illness underlying her acute medical conditions.  The difference between full scope medical intervention and comfort care was considered.  I introduced the concept of a comfort path, emphasizing that this path involves de-escalating and stopping full scope medical interventions, allowing a natural course to occur.  Discussed that the goal is comfort and dignity rather than cure/prolonging life. Encouraged [name] to consider at what point they would want to stop full scope medical interventions, keeping in mind the concept of quality of life.   [name] states that she does not want to make any major decisions without her sister Misty Stanley, who is traveling to [place] tomorrow from her home in [place].  We make plans to meet around 2 PM.   Review of Systems  Unable to perform  ROS: Dementia    Objective:   Primary Diagnoses: Present on Admission:  Pain  Essential hypertension  Expressive aphasia  Hypokalemia  Morbid obesity (HCC)  Hip pain   Physical Exam Vitals reviewed.  Constitutional:      General: She is in acute distress.     Appearance: She is ill-appearing.  Pulmonary:     Effort: Pulmonary effort is normal.  Neurological:     Mental Status: She is alert. She is confused.     Motor: Weakness present.  Psychiatric:        Behavior: Behavior is agitated.        Cognition and Memory: Cognition is impaired.     Vital Signs:  BP (!) 138/115 (BP Location: Right Arm)   Pulse 97   Temp 97.8 F (36.6 C) (Oral)   Resp 14   Ht 5\' 5"  (1.651 m)   Wt 100.9 kg   SpO2 99%   BMI 37.02 kg/m   Palliative Assessment/Data: PPS 20-30%     Assessment & Plan:   SUMMARY OF RECOMMENDATIONS   DNR/DNI as previously documented Continue current interventions Follow-up meeting scheduled for 2 PM tomorrow  Primary Decision Maker: daughters  Symptom Management:  Agree with oxycodone IR or morphine IV as needed for pain Agree with clonazepam 3 times daily as needed for anxiety  Prognosis:  Poor overall  Discharge Planning:  To Be Determined     Thank you for allowing to participate in the care of [place]  MDM - High   Signed by: [name], NP Palliative Medicine Team  Team Phone # [phone]  For individual providers, please see AMION

## 2022-03-05 NOTE — TOC Initial Note (Signed)
Transition of Care [facility]) - Initial/Assessment Note    Patient Details  Name: Rebecca Barnes MRN: [id number] Date of Birth: 20-Jul-1946  Transition of Care [facility]) CM/SW Contact:    Amada Jupiter, LCSW Phone Number: 03/05/2022, 3:06 PM  Clinical Narrative:                 Have left a VM for pt's daughter after speaking with admissions liaison at [facility].  Liaison confirms that pt will return there when medically ready for dc.  Have left VM for daughter to please contact TOC if there are any concerns about this plan.  Appears we are awaiting PMT consult at this point.  TOC will follow along.  Expected Discharge Plan: Skilled Nursing Facility Barriers to Discharge: Continued Medical Work up   Patient Goals and CMS Choice        Expected Discharge Plan and Services Expected Discharge Plan: Skilled Nursing Facility In-house Referral: Clinical Social Work   Post Acute Care Choice: Skilled Nursing Facility Living arrangements for the past 2 months: Skilled Nursing Facility                 DME Arranged: N/A DME Agency: NA                  Prior Living Arrangements/Services Living arrangements for the past 2 months: Skilled Nursing Facility Lives with:: Facility Resident Patient language and need for interpreter reviewed:: Yes        Need for Family Participation in Patient Care: No (Comment) Care giver support system in place?: Yes (comment)   Criminal Activity/Legal Involvement Pertinent to Current Situation/Hospitalization: No - Comment as needed  Activities of Daily Living Home Assistive Devices/Equipment: Other (Comment) (pt lives in snf and unable to verbalize what medical equipment she uses) ADL Screening (condition at time of admission) Patient's cognitive ability adequate to safely complete daily activities?: No Is the patient deaf or have difficulty hearing?: No Does the patient have difficulty seeing, even when wearing glasses/contacts?: No Does the patient  have difficulty concentrating, remembering, or making decisions?: Yes Patient able to express need for assistance with ADLs?: No Does the patient have difficulty dressing or bathing?: Yes Independently performs ADLs?: No Communication: Needs assistance Is this a change from baseline?: Pre-admission baseline Dressing (OT): Needs assistance Is this a change from baseline?: Pre-admission baseline Grooming: Needs assistance Is this a change from baseline?: Pre-admission baseline Feeding: Needs assistance Is this a change from baseline?: Pre-admission baseline Bathing: Needs assistance Is this a change from baseline?: Pre-admission baseline Toileting: Needs assistance Is this a change from baseline?: Pre-admission baseline In/Out Bed: Needs assistance Is this a change from baseline?: Pre-admission baseline Walks in Home: Needs assistance Is this a change from baseline?: Pre-admission baseline Does the patient have difficulty walking or climbing stairs?: Yes Weakness of Legs: Both Weakness of Arms/Hands: Both  Permission Sought/Granted Permission sought to share information with : Facility Medical sales representative                Emotional Assessment Appearance:: Appears stated age   Affect (typically observed): Unable to Assess Orientation: : Oriented to Self Alcohol / Substance Use: Not Applicable Psych Involvement: No (comment)  Admission diagnosis:  Hypokalemia [E87.6] Pain [R52] Postoperative pain [G89.18] Postprocedural seroma of a musculoskeletal structure following other procedure [V03.500] Hip pain [M25.559] Patient Active Problem List   Diagnosis Date Noted   Hip pain 03/04/2022   Postoperative abscess 03/03/2022   Pain 03/03/2022   Bandemia 02/25/2022   Hypomagnesemia  02/25/2022   Morbid obesity (HCC) 02/25/2022   Periprosthetic hip fracture 02/23/2022   Fall at nursing home 02/23/2022   GIB (gastrointestinal bleeding) 02/14/2022   Hematoma of right hip  02/14/2022   Normocytic anemia 02/14/2022   Agitation 02/06/2022   Hip fracture (HCC) 02/01/2022   DNR (do not resuscitate) 02/01/2022   Acute cerebral infarction (HCC) 12/06/2021   Aphasia    HLD (hyperlipidemia)    Expressive aphasia 12/03/2021   Obesity (BMI 30-39.9) 12/03/2021   History of marijuana use 12/03/2021   Stroke-like symptoms 12/02/2021   Candidiasis of female genitalia 03/20/2021   History of CVA (cerebrovascular accident) 03/20/2021   Hyponatremia 03/14/2021   Hypokalemia 03/14/2021   Thrombocytosis 03/14/2021   Polycythemia 03/14/2021   Late effect of CVA (RLE weakness) 03/14/2021   Essential hypertension 09/18/2019   Anxiety with depression 06/07/2019   Type 2 diabetes mellitus without complication, without long-term current use of insulin (HCC) 06/07/2019   Disturbance of skin sensation 06/07/2019   Leukocytosis 06/07/2019   Secondary DM with peripheral vascular disease (HCC) 06/07/2019   Generalized weakness 06/07/2019   Anxiety 03/29/2007   PCP:  Caesar Bookman, NP Pharmacy:   [facility] [address] [place] [address] Phone: [phone] Fax: [phone]     Social Determinants of Health (SDOH) Interventions    Readmission Risk Interventions    02/26/2022   12:08 PM  Readmission Risk Prevention Plan  Transportation Screening Complete  Medication Review (RN Care Manager) Complete  PCP or Specialist appointment within 3-5 days of discharge Complete  HRI or Home Care Consult Complete  SW Recovery Care/Counseling Consult Complete  Palliative Care Screening Not Applicable  Skilled Nursing Facility Complete

## 2022-03-05 NOTE — Progress Notes (Signed)
PROGRESS NOTE    Loana Salvaggio  FHQ:[id number] DOB: 1945/11/13 DOA: 03/03/2022 PCP: Caesar Bookman, NP    Brief Narrative:  Rebecca Barnes is a 76 y.o. female with medical history significant of dementia, CVA with residual aphasia, type 2 diabetes, hypertension, anemia who presents with worsening pain of right hip.   Patient was just admitted from 02/22/2022-02/26/2022 for right hip periprosthetic hip fracture s/p fall.  She underwent revision of right THA by Dr. Linna Caprice on 02/24/2022.  EBL about 1200cc and was transfused 3 units PRBC with stable hemoglobin afterwards and was started on Plavix alone for VTE prophylaxis.  She was started on IV ceftriaxone for inflammatory tissue irritation around the surgical site and was discharged on a week of p.o. cefadroxil.  Came back to the ER with worsening hip pain per nursing home-- patient has dementia and is aphasic.  Discussed with daughter and will get palliative care consult    Assessment and Plan: * Postoperative seroma vs abscess -suspected seroma- no sign of infection S/p underwent revision of right THA by Dr. Linna Caprice on 02/24/2022 following a mechanical fall. -allegedly Presents with worsening pain of right hip and repeat CT femur today showed extensive skin thickening and subcutaneous soft tissue swelling of the thigh.  There is also large superficial fluid collection along the anterior proximal thigh that could be abscess or postoperative seroma/hematoma. There is also coalescent fluid along the posterior lateral incision and the majority of the IT band with a similar differential as above.   -ortho consult appreciated- no plan for intervention -d/c'd abx\ -pain contolr  Morbid obesity (HCC) .Estimated body mass index is 37.02 kg/m as calculated from the following:   Height as of this encounter: 5\' 5"  (1.651 m).   Weight as of this encounter: 100.9 kg.   History of CVA (cerebrovascular accident) With expressive aphasia.  Reportedly  from previous documentation to be nonverbal at baseline.  Hypokalemia Replete  Essential hypertension Continue amlodipine  Type 2 diabetes mellitus without complication, without long-term current use of insulin (HCC) With hyperglycemia.   -SSI  Delirium -started low dose zyprexa with improvement  Anemia -monitor daily -transfuse for Hgb <7   Palliative care consult for GOC-- 4 hospitalizations in 1 month    DVT prophylaxis: SCDs Start: 03/03/22 2144    Code Status: DNR  Disposition Plan:  Level of care: Med-Surg Status is: inpt    Consultants:  ortho   Subjective: Not yelling out today, appears more comfortable  Objective: Vitals:   03/04/22 1119 03/04/22 1331 03/04/22 1741 03/05/22 0615  BP: (!) 174/77 (!) 145/64 (!) 176/153 (!) 138/115  Pulse: 83 82 95 97  Resp:  18 18 14   Temp: 99.7 F (37.6 C) 97.9 F (36.6 C) 97.8 F (36.6 C) 97.8 F (36.6 C)  TempSrc: Oral Oral Oral Oral  SpO2: 96% 95% 99%   Weight:      Height:        Intake/Output Summary (Last 24 hours) at 03/05/2022 1236 Last data filed at 03/05/2022 1100 Gross per 24 hour  Intake 820 ml  Output 2320 ml  Net -1500 ml   Filed Weights   03/03/22 1547 03/04/22 0047  Weight: 97.5 kg 100.9 kg    Examination:   General: Appearance:    Obese female in no acute distress     Lungs:     respirations unlabored  Heart:    Normal heart rate.    MS:   All extremities are intact.  Neurologic:   Will awaken, does not speak       Data Reviewed: I have personally reviewed following labs and imaging studies  CBC: Recent Labs  Lab 03/03/22 1546 03/03/22 1607 03/04/22 0342 03/05/22 0345  WBC 9.7  --  9.2 9.4  NEUTROABS 6.6  --   --   --   HGB 8.5* 8.2* 8.3* 7.9*  HCT 26.7* 24.0* 26.6* 24.9*  MCV 82.4  --  85.3 82.2  PLT 502*  --  431* 476*   Basic Metabolic Panel: Recent Labs  Lab 03/03/22 1546 03/03/22 1607 03/04/22 0342 03/05/22 0345  NA 135 136 137 140  K 2.8* 3.1*  3.6 3.0*  CL 106 101 105 106  CO2 20*  --  24 26  GLUCOSE 189* 204* 115* 112*  BUN 11 9 8  6*  CREATININE 0.46 0.30* 0.42* 0.38*  CALCIUM 8.0*  --  8.3* 8.5*  MG  --   --   --  1.6*   GFR: Estimated Creatinine Clearance: 71.6 mL/min (A) (by C-G formula based on SCr of 0.38 mg/dL (L)). Liver Function Tests: Recent Labs  Lab 03/03/22 1546  AST 16  ALT 12  ALKPHOS 73  BILITOT 0.4  PROT 5.5*  ALBUMIN 2.2*   No results for input(s): "LIPASE", "AMYLASE" in the last 168 hours. No results for input(s): "AMMONIA" in the last 168 hours. Coagulation Profile: No results for input(s): "INR", "PROTIME" in the last 168 hours. Cardiac Enzymes: No results for input(s): "CKTOTAL", "CKMB", "CKMBINDEX", "TROPONINI" in the last 168 hours. BNP (last 3 results) No results for input(s): "PROBNP" in the last 8760 hours. HbA1C: No results for input(s): "HGBA1C" in the last 72 hours. CBG: Recent Labs  Lab 03/04/22 1125 03/04/22 1733 03/04/22 2108 03/05/22 0732 03/05/22 1113  GLUCAP 130* 128* 127* 124* 130*   Lipid Profile: No results for input(s): "CHOL", "HDL", "LDLCALC", "TRIG", "CHOLHDL", "LDLDIRECT" in the last 72 hours. Thyroid Function Tests: No results for input(s): "TSH", "T4TOTAL", "FREET4", "T3FREE", "THYROIDAB" in the last 72 hours. Anemia Panel: No results for input(s): "VITAMINB12", "FOLATE", "FERRITIN", "TIBC", "IRON", "RETICCTPCT" in the last 72 hours. Sepsis Labs: Recent Labs  Lab 03/03/22 1544 03/03/22 1840  LATICACIDVEN 3.0* 1.6    Recent Results (from the past 240 hour(s))  Culture, blood (Routine X 2) w Reflex to ID Panel     Status: None   Collection Time: 02/23/22  4:19 PM   Specimen: BLOOD  Result Value Ref Range Status   Specimen Description   Final    BLOOD RIGHT ANTECUBITAL Performed at [facility], [address], [place], [place]    Special Requests   Final    BOTTLES DRAWN AEROBIC ONLY Blood Culture adequate  volume Performed at [facility], [address], [place], [place]    Culture   Final    NO GROWTH 5 DAYS Performed at [facility], [address], [place], [place]    Report Status 02/28/2022 FINAL  Final  MRSA Next Gen by PCR, Nasal     Status: None   Collection Time: 02/23/22  5:29 PM   Specimen: Nasal Mucosa; Nasal Swab  Result Value Ref Range Status   MRSA by PCR Next Gen NOT DETECTED NOT DETECTED Final    Comment: (NOTE) The GeneXpert MRSA Assay (FDA approved for NASAL specimens only), is one component of a comprehensive MRSA colonization surveillance program. It is not intended to diagnose MRSA infection nor  to guide or monitor treatment for MRSA infections. Test performance is not FDA approved in patients less than 43 years old. Performed at [facility], [address], [place], [place] [address]   Blood culture (routine x 2)     Status: None (Preliminary result)   Collection Time: 03/03/22  3:46 PM   Specimen: BLOOD  Result Value Ref Range Status   Specimen Description   Final    BLOOD BLOOD LEFT HAND Performed at [facility], [address], [place], [place] [address]    Special Requests   Final    BOTTLES DRAWN AEROBIC AND ANAEROBIC Blood Culture adequate volume Performed at [facility], [address], [place], [place] [address]    Culture   Final    NO GROWTH 2 DAYS Performed at [facility], [address] [place], [place] [address]    Report Status PENDING  Incomplete  Blood culture (routine x 2)     Status: None (Preliminary result)   Collection Time: 03/03/22  4:01 PM   Specimen: BLOOD  Result Value Ref Range Status   Specimen Description   Final    BLOOD SITE NOT SPECIFIED Performed at [facility], [address] [place], [place] [address]    Special Requests   Final    BOTTLES DRAWN AEROBIC ONLY Blood Culture results  may not be optimal due to an excessive volume of blood received in culture bottles Performed at [facility], [address], [place], [place] [address]    Culture   Final    NO GROWTH 2 DAYS Performed at [facility], [address] [place], [place] [address]    Report Status PENDING  Incomplete         Radiology Studies: CT FEMUR RIGHT W CONTRAST  Result Date: 03/03/2022 CLINICAL DATA:  Soft tissue infection suspected, thigh, xray done R thigh swelling. recent hip revision surgery r/o osteo vs fracture vs soft swelling infection EXAM: CT OF THE LOWER RIGHT EXTREMITY WITH CONTRAST TECHNIQUE: Multidetector CT imaging of the lower right extremity was performed according to the standard protocol following intravenous contrast administration. RADIATION DOSE REDUCTION: This exam was performed according to the departmental dose-optimization program which includes automated exposure control, adjustment of the mA and/or kV according to patient size and/or use of iterative reconstruction technique. CONTRAST:  OMNIPAQUE IOHEXOL 300 MG/ML  SOLN COMPARISON:  None Available. FINDINGS: Bones/Joint/Cartilage Postsurgical changes of recent arthroplasty revision with new long stem right hip arthroplasty and cerclage wire fixation for a periprosthetic fracture. Near anatomic fracture alignment. Intact hardware without evidence of loosening. There is no bony erosion or frank bony destruction. Probable trace joint effusion. There is tricompartment osteoarthritis of the knee with severe medial compartment joint space narrowing. Small right knee joint effusion. Ligaments Suboptimally assessed by CT. Muscles and Tendons There is a fluid collection on the anterior compartment of the thigh, tracking towards the hip joint. This communicates with a superficial rim enhancing collection along the anterior proximal thigh which measures up to 8.6 x 4.0 x 16.3 cm (series 7, image 59, series 12,  image 61). Soft tissues Posterolateral incision with underlying coalescent fluid along the superficial surface of the IT band, extending from the hip to the level of the distal femur, measuring 4.3 x 2.3 cm. This demonstrates no clear rim enhancement. There is extensive skin thickening and subcutaneous soft tissue swelling of the right thigh. IMPRESSION: Postsurgical changes  of recent right hip revision with long stem arthroplasty and cerclage wire fixation for a periprosthetic fracture. Near anatomic fracture alignment. Intact hardware without evidence of loosening. Extensive skin thickening and subcutaneous soft tissue swelling of the thigh, as can be seen in cellulitis. Large superficial fluid collection with rim enhancement along the anterior proximal thigh measuring 8.6 x 4.0 cm in the axial plane and up to 6.3 cm and proximal-distal extent, with probable communicating fluid coursing deep towards the right hip joint, with punctate foci of gas. This could represent abscess or postoperative seroma/hematoma. Posterolateral incision with additional underlying coalescent fluid along the majority of the IT band, measuring 4.3 x 2.3 cm in the axial plane, without clear rim enhancement. Differential is the same as above but felt less likely to be infectious given no definitive rim enhancement. 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 Small right knee joint effusion. Electronically Signed   By: Caprice Renshaw M.D.   On: 03/03/2022 17:15   CT HEAD WO CONTRAST ( )  Result Date: 03/03/2022 CLINICAL DATA:  Mental status change, unknown cause EXAM: CT HEAD WITHOUT CONTRAST TECHNIQUE: Contiguous axial images were obtained from the base of the skull through the vertex without intravenous contrast. RADIATION DOSE REDUCTION: This exam was performed according to the departmental dose-optimization program which includes automated exposure control, adjustment of the mA and/or kV according to  patient size and/or use of iterative reconstruction technique. COMPARISON:  CT head 02/23/2022. FINDINGS: Brain: Redemonstrated left posterior MCA territory infarct with encephalomalacia predominantly in the left parietal and temporal lobes. Additional patchy white matter hypoattenuation, nonspecific but compatible with chronic microvascular ischemic disease. No evidence of acute large vascular territory infarct, acute hemorrhage, mass lesion midline shift, or hydrocephalus. Cerebral atrophy. Vascular: No hyperdense vessel identified. Calcific atherosclerosis. Skull: No acute fracture. Sinuses/Orbits: Clear sinuses.  No acute orbital findings. Other: No mastoid effusions. IMPRESSION: 1. No evidence of acute intracranial abnormality. 2. Remote left MCA territory infarct and chronic microvascular ischemic disease. Electronically Signed   By: Feliberto Harts M.D.   On: 03/03/2022 16:59   DG Chest Port 1 View  Result Date: 03/03/2022 CLINICAL DATA:  Altered mental status. EXAM: PORTABLE CHEST 1 VIEW COMPARISON:  02/22/2022 FINDINGS: Numerous leads and wires project over the chest. Midline trachea. Borderline cardiomegaly. The Chin overlies the apices minimally. No pleural effusion or pneumothorax. Clear lungs. IMPRESSION: No acute cardiopulmonary disease. Electronically Signed   By: Jeronimo Greaves M.D.   On: 03/03/2022 16:15        Scheduled Meds:  amLODipine  5 mg Oral Daily   clopidogrel  75 mg Oral Daily   docusate sodium  100 mg Oral BID   fenofibrate  54 mg Oral Daily   FLUoxetine  40 mg Oral Daily   insulin aspart  0-6 Units Subcutaneous TID PC & HS   lidocaine  1 patch Transdermal Q24H   melatonin  10 mg Oral QHS   OLANZapine zydis  2.5 mg Oral BID   potassium chloride  40 mEq Oral Q4H   sodium chloride  1 g Oral TID   cyanocobalamin  1,000 mcg Oral Daily   Continuous Infusions:     LOS: 1 day    Time spent: 45 minutes spent on chart review, discussion with nursing staff,  consultants, updating family and interview/physical exam; more than 50% of that time was spent in counseling and/or coordination of care.    Joseph Art, DO [facility] Available via Epic secure chat  7am-7pm After these hours, please refer to coverage provider listed on [email or web address] 03/05/2022, 12:36 PM

## 2022-03-05 NOTE — [place] FL2 (Signed)
[place] MEDICAID FL2 LEVEL OF CARE SCREENING TOOL     IDENTIFICATION  Patient Name: Rebecca Barnes Birthdate: Sep 23, 1945 Sex: female Admission Date (Current Location): 03/03/2022  [facility] and [place] Number:  Producer, television/film/video and Address:  [facility],  [address] [place], [place] [address]      Provider Number: [id number]  Attending Physician Name and Address:  Joseph Art, DO  Relative Name and Phone Number:  daughter, [name] @ [phone]    Current Level of Care: Hospital Recommended Level of Care: Skilled Nursing Facility Prior Approval Number:    Date Approved/Denied:   PASRR Number: pending  Discharge Plan:      Current Diagnoses: Patient Active Problem List   Diagnosis Date Noted   Hip pain 03/04/2022   Postoperative abscess 03/03/2022   Pain 03/03/2022   Bandemia 02/25/2022   Hypomagnesemia 02/25/2022   Morbid obesity (HCC) 02/25/2022   Periprosthetic hip fracture 02/23/2022   Fall at nursing home 02/23/2022   GIB (gastrointestinal bleeding) 02/14/2022   Hematoma of right hip 02/14/2022   Normocytic anemia 02/14/2022   Agitation 02/06/2022   Hip fracture (HCC) 02/01/2022   DNR (do not resuscitate) 02/01/2022   Acute cerebral infarction (HCC) 12/06/2021   Aphasia    HLD (hyperlipidemia)    Expressive aphasia 12/03/2021   Obesity (BMI 30-39.9) 12/03/2021   History of marijuana use 12/03/2021   Stroke-like symptoms 12/02/2021   Candidiasis of female genitalia 03/20/2021   History of CVA (cerebrovascular accident) 03/20/2021   Hyponatremia 03/14/2021   Hypokalemia 03/14/2021   Thrombocytosis 03/14/2021   Polycythemia 03/14/2021   Late effect of CVA (RLE weakness) 03/14/2021   Essential hypertension 09/18/2019   Anxiety with depression 06/07/2019   Type 2 diabetes mellitus without complication, without long-term current use of insulin (HCC) 06/07/2019   Disturbance of skin sensation 06/07/2019   Leukocytosis 06/07/2019    Secondary DM with peripheral vascular disease (HCC) 06/07/2019   Generalized weakness 06/07/2019   Anxiety 03/29/2007    Orientation RESPIRATION BLADDER Height & Weight     Self  Normal Incontinent Weight: 222 lb 7.1 oz (100.9 kg) Height:  5\' 5"  (165.1 cm)  BEHAVIORAL SYMPTOMS/MOOD NEUROLOGICAL BOWEL NUTRITION STATUS           AMBULATORY STATUS COMMUNICATION OF NEEDS Skin   Extensive Assist   Other (Comment), Normal                       Personal Care Assistance Level of Assistance  Feeding, Dressing, Bathing Bathing Assistance: Maximum assistance Feeding assistance: Maximum assistance Dressing Assistance: Maximum assistance     Functional Limitations Info             SPECIAL CARE FACTORS FREQUENCY  PT (By licensed PT), OT (By licensed OT)     PT Frequency: 5x/wk OT Frequency: 5x/wk            Contractures Contractures Info: Not present    Additional Factors Info  Code Status, Allergies Code Status Info: DNR Allergies Info: Januvia (Sitagliptin), Statins, Sulfa Antibiotics           Current Medications (03/05/2022):  This is the current hospital active medication list Current Facility-Administered Medications  Medication Dose Route Frequency Provider Last Rate Last Admin   acetaminophen (TYLENOL) tablet 650 mg  650 mg Oral Q6H PRN Tu, Ching T, DO   650 mg at 03/05/22 1035   amLODipine (NORVASC) tablet 5 mg  5 mg Oral Daily Tu, Ching T,  DO   5 mg at 03/05/22 1035   bisacodyl (DULCOLAX) suppository 10 mg  10 mg Rectal Daily PRN Joseph Art, DO       clonazePAM Scarlette Calico) tablet 0.5 mg  0.5 mg Oral TID PRN Marlin Canary U, DO   0.5 mg at 03/04/22 2125   clopidogrel (PLAVIX) tablet 75 mg  75 mg Oral Daily Marlin Canary U, DO   75 mg at 03/05/22 1035   docusate sodium (COLACE) capsule 100 mg  100 mg Oral BID Tu, Ching T, DO   100 mg at 03/05/22 1035   fenofibrate tablet 54 mg  54 mg Oral Daily Tu, Ching T, DO   54 mg at 03/05/22 1035   FLUoxetine  (PROZAC) capsule 40 mg  40 mg Oral Daily Tu, Ching T, DO   40 mg at 03/05/22 1035   insulin aspart (novoLOG) injection 0-6 Units  0-6 Units Subcutaneous TID PC & HS Tu, Ching T, DO   1 Units at 03/03/22 2218   lidocaine (LIDODERM) 5 % 1 patch  1 patch Transdermal Q24H Marlin Canary U, DO   1 patch at 03/04/22 2124   melatonin tablet 10 mg  10 mg Oral QHS Tu, Ching T, DO   10 mg at 03/04/22 2125   morphine (PF) 2 MG/ML injection 1 mg  1 mg Intravenous Q4H PRN Marlin Canary U, DO   1 mg at 03/04/22 1715   OLANZapine zydis (ZYPREXA) disintegrating tablet 2.5 mg  2.5 mg Oral BID Vann, Jessica U, DO   2.5 mg at 03/05/22 1034   oxyCODONE (Oxy IR/ROXICODONE) immediate release tablet 5 mg  5 mg Oral Q4H PRN Tu, Ching T, DO   5 mg at 03/05/22 0254   sodium chloride tablet 1 g  1 g Oral TID Tu, Ching T, DO   1 g at 03/05/22 1034   vitamin B-12 (CYANOCOBALAMIN) tablet 1,000 mcg  1,000 mcg Oral Daily Tu, Ching T, DO   1,000 mcg at 03/05/22 1035     Discharge Medications: Please see discharge summary for a list of discharge medications.  Relevant Imaging Results:  Relevant Lab Results:   Additional Information SS# [id number]  Amada Jupiter, LCSW

## 2022-03-05 NOTE — Evaluation (Signed)
Physical Therapy Evaluation Patient Details Name: Rebecca Barnes MRN: [id number] DOB: 02/25/1946 Today's Date: 03/05/2022  History of Present Illness  76 y.o. female  just admitted from 02/22/2022-02/26/2022 for right hip periprosthetic hip fracture s/p fall. She underwent revision of right THA by Dr. Linna Caprice on 02/24/2022.worsening pain of right hip and repeat CT femur  showed extensive skin thickening and subcutaneous soft tissue swelling of the thigh. There is also large superficial fluid collection along the anterior proximal thigh that could be abscess or postoperative seroma/hematoma and  coalescent fluid along the posterior lateral incision and the majority of the IT band with a similar differential as above.    no intervention per ortho.   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  Clinical Impression  Pt admitted with above diagnosis.  Pt cooperative and able to follow some one step commands today. Participates rolling R and L with max assist of 2. Recommend pt return to SNF. Will follow in acute setting.   Pt currently with functional limitations due to the deficits listed below (see PT Problem List). Pt will benefit from skilled PT to increase their independence and safety with mobility to allow discharge to the venue listed below.          Recommendations for follow up therapy are one component of a multi-disciplinary discharge planning process, led by the attending physician.  Recommendations may be updated based on patient status, additional functional criteria and insurance authorization.  Follow Up Recommendations Skilled nursing-short term rehab (<3 hours/day) Can patient physically be transported by private vehicle: No    Assistance Recommended at Discharge Frequent or constant Supervision/Assistance  Patient can return home with the following  Assistance with  cooking/housework;Assist for transportation;Help with stairs or ramp for entrance;Two people to help with walking and/or transfers;Two people to help with bathing/dressing/bathroom;Assistance with feeding;Direct supervision/assist for medications management;Direct supervision/assist for financial management    Equipment Recommendations None recommended by PT  Recommendations for Other Services       Functional Status Assessment Patient has had a recent decline in their functional status and demonstrates the ability to make significant improvements in function in a reasonable and predictable amount of time.     Precautions / Restrictions Precautions Precautions: Fall;Posterior Hip Precaution Comments: R visual deficits, aphasia Restrictions Weight Bearing Restrictions: Yes RLE Weight Bearing: Partial weight bearing RLE Partial Weight Bearing Percentage or Pounds: 50      Mobility  Bed Mobility Overal bed mobility: Needs Assistance Bed Mobility: Rolling Rolling: Max assist, +2 for physical assistance, +2 for safety/equipment         General bed mobility comments: pt follows commands to self assist with UEs, rolls R and L with max assist of 2; attempted to have pt hold self in s/l position for pressure relief    Transfers                   General transfer comment: NT    Ambulation/Gait                  Stairs            Wheelchair Mobility    Modified Rankin (Stroke Patients Only)       Balance  Pertinent Vitals/Pain Pain Assessment Pain Assessment: Faces Faces Pain Scale: Hurts even more Pain Descriptors / Indicators: Grimacing, Guarding, Moaning Pain Intervention(s): Limited activity within patient's tolerance, Monitored during session, Premedicated before session, Repositioned, Utilized relaxation techniques    Home Living Family/patient expects to be discharged to:: Skilled  nursing facility                        Prior Function                       Hand Dominance        Extremity/Trunk Assessment   Upper Extremity Assessment Upper Extremity Assessment: Generalized weakness;Difficult to assess due to impaired cognition    Lower Extremity Assessment Lower Extremity Assessment: Generalized weakness;RLE deficits/detail;LLE deficits/detail RLE Deficits / Details: pt allows partial hip and knee AAROM. strength 2/5, limited by pain RLE: Unable to fully assess due to pain LLE Deficits / Details: AROM grossly WFL, strength ~ 2+ to 3/5 grossly       Communication      Cognition Arousal/Alertness: Awake/alert Behavior During Therapy: Anxious Overall Cognitive Status: Difficult to assess Area of Impairment: Attention, Following commands                       Following Commands: Follows one step commands consistently, Follows multi-step commands inconsistently Safety/Judgement: Decreased awareness of deficits   Problem Solving: Slow processing, Difficulty sequencing, Requires verbal cues, Requires tactile cues          General Comments      Exercises General Exercises - Lower Extremity Heel Slides: 5 reps, AROM, Both, AAROM   Assessment/Plan    PT Assessment Patient needs continued PT services  PT Problem List Decreased activity tolerance;Decreased balance;Decreased mobility;Decreased knowledge of use of DME;Decreased safety awareness;Decreased knowledge of precautions;Pain;Decreased strength       PT Treatment Interventions Functional mobility training;Therapeutic activities;Therapeutic exercise;Balance training    PT Goals (Current goals can be found in the Care Plan section)  Acute Rehab PT Goals Patient Stated Goal: Pt unable to state PT Goal Formulation: Patient unable to participate in goal setting Time For Goal Achievement: 03/19/22 Potential to Achieve Goals: Fair    Frequency Min 2X/week      Co-evaluation               AM-PAC PT "6 Clicks" Mobility  Outcome Measure Help needed turning from your back to your side while in a flat bed without using bedrails?: Total Help needed moving from lying on your back to sitting on the side of a flat bed without using bedrails?: Total Help needed moving to and from a bed to a chair (including a wheelchair)?: Total Help needed standing up from a chair using your arms (e.g., wheelchair or bedside chair)?: Total Help needed to walk in hospital room?: Total Help needed climbing 3-5 steps with a railing? : Total 6 Click Score: 6    End of Session   Activity Tolerance: Patient limited by pain Patient left: in bed;with call bell/phone within reach;with bed alarm set   PT Visit Diagnosis: History of falling (Z91.81);Other abnormalities of gait and mobility (R26.89);Muscle weakness (generalized) (M62.81) Pain - Right/Left: Right Pain - part of body: Hip    Time: 7867-6720 PT Time Calculation (min) (ACUTE ONLY): 11 min   Charges:   PT Evaluation $PT Eval Low Complexity: 1 Low          Yolando Gillum, PT  Acute Rehab Dept [facility]) [phone]  [facility] Weekend Pager [facility] only)  [phone]  03/05/2022   [facility] 03/05/2022, 1:27 PM

## 2022-03-06 ENCOUNTER — Encounter (HOSPITAL_COMMUNITY): Payer: Self-pay | Admitting: Internal Medicine

## 2022-03-06 DIAGNOSIS — T8149XA Infection following a procedure, other surgical site, initial encounter: Secondary | ICD-10-CM | POA: Diagnosis not present

## 2022-03-06 DIAGNOSIS — Z515 Encounter for palliative care: Secondary | ICD-10-CM | POA: Diagnosis not present

## 2022-03-06 DIAGNOSIS — Z7189 Other specified counseling: Secondary | ICD-10-CM | POA: Diagnosis not present

## 2022-03-06 DIAGNOSIS — R52 Pain, unspecified: Secondary | ICD-10-CM | POA: Diagnosis not present

## 2022-03-06 MED ORDER — LORAZEPAM 1 MG PO TABS: 2.0000 mg | ORAL_TABLET | ORAL | Status: DC | PRN

## 2022-03-06 MED ORDER — LORAZEPAM 1 MG PO TABS
2.0000 mg | ORAL_TABLET | ORAL | Status: DC | PRN
  Administered 2022-03-06 – 2022-03-07 (×3): 2 mg via ORAL
  Filled 2022-03-06 (×3): qty 2

## 2022-03-06 NOTE — Plan of Care (Signed)
  Problem: Clinical Measurements: Goal: Cardiovascular complication will be avoided Outcome: Progressing   

## 2022-03-06 NOTE — TOC Progression Note (Signed)
Transition of Care [facility]) - Progression Note    Patient Details  Name: Rebecca Barnes MRN: [id number] Date of Birth: 01-25-1946  Transition of Care [facility]) CM/SW Contact  Darleene Cleaver, [place] Phone Number: 03/06/2022, 5:51 PM  Clinical Narrative:     CSW informed that patient is now waiting on a bed at [facility] and they do not have a bed available today.  CSW to continue to follow patient's progress throughout discharge planning.  Expected Discharge Plan: Skilled Nursing Facility Barriers to Discharge: Continued Medical Work up  Expected Discharge Plan and Services Expected Discharge Plan: Skilled Nursing Facility In-house Referral: Clinical Social Work   Post Acute Care Choice: Skilled Nursing Facility Living arrangements for the past 2 months: Skilled Nursing Facility                 DME Arranged: N/A DME Agency: NA                   Social Determinants of Health (SDOH) Interventions    Readmission Risk Interventions    02/26/2022   12:08 PM  Readmission Risk Prevention Plan  Transportation Screening Complete  Medication Review [employer]) Complete  PCP or Specialist appointment within 3-5 days of discharge Complete  HRI or Home Care Consult Complete  SW Recovery Care/Counseling Consult Complete  Palliative Care Screening Not Applicable  Skilled Nursing Facility Complete

## 2022-03-06 NOTE — Progress Notes (Signed)
24 hour chart audit completed 

## 2022-03-06 NOTE — Progress Notes (Signed)
Palliative Medicine Progress Note   Patient Name: Rebecca Barnes       Date: 03/06/2022 DOB: 1945/09/22  Age: 76 y.o. MRN#: [id number] Attending Physician: Lurene Shadow, MD Primary Care Physician: Caesar Bookman, NP Admit Date: 03/03/2022   HPI/Patient Profile:  76 y.o. female  with past medical history of dementia, CVA with residual aphasia, type 2 diabetes, hypertension, and anemia who presented to [facility] ED on 03/03/2022 with worsening pain of right hip.    Patient was recently admitted 02/22/2022 through 02/26/2022 for right hip periprosthetic hip fracture status post fall.  She underwent revision of right THA by Dr. Linna Caprice on 02/24/2022.  CT femur done in the ED showed extensive skin thickening and subcutaneous soft tissue swelling of the thigh suspicious for cellulitis.  There is also a large superficial fluid collection along the anterior proximal thigh that could be abscess or postoperative seroma/hematoma.   Patient seen by ortho, who felt that patient had mild expected postop swelling to thigh.   Subjective: Patient is resting in bed, discussed with bedside RN, no family present at bedside, appears in no acute distress currently      Objective:  Physical Exam Vitals reviewed.  Constitutional:      General: She is not in acute distress.    Appearance: She is ill-appearing.  Pulmonary:     Effort: Pulmonary effort is normal.  Neurological:     Mental Status: She is alert. She is confused.     Motor: Weakness present.  Psychiatric:        Mood and Affect: Mood is anxious.             Vital Signs: BP (!) 154/67 (BP Location: Left Arm)   Pulse 82   Temp 97.8 F (36.6 C)   Resp 17   Ht 5\' 5"  (1.651 m)   Wt 100.9 kg   SpO2 90%   BMI 37.02 kg/m  SpO2: SpO2: 90 % O2  Device: O2 Device: Room Air O2 Flow Rate:      LBM: Last BM Date : 03/04/22     Palliative Assessment/Data: PPS 20%     Palliative Medicine Assessment & Plan   Assessment: Principal Problem:   Postoperative abscess Active Problems:   Type 2 diabetes mellitus without complication, without long-term current use of insulin (HCC)  Essential hypertension   Hypokalemia   History of CVA (cerebrovascular accident)   Expressive aphasia   Morbid obesity (HCC)   Pain   Hip pain    Recommendations/Plan: Full comfort measures initiated DNR/DNI as previously documented Transfer to [facility] when bed becomes available - TOC consult placed Continue symptom management at EOL as well as discontinued orders that were not focused on comfort Start scheduled Ativan 1 to 2 mg IV every 4 hours PMT will continue to follow  Okay to discontinue finger sticks We will try to contact TOC and hospice liaisons  Symptom Management:  Morphine prn for pain or dyspnea Lorazepam (ATIVAN) prn for anxiety Haloperidol (HALDOL) prn for agitation  Glycopyrrolate (ROBINUL) for excessive secretions Ondansetron (ZOFRAN) prn for nausea Polyvinyl alcohol (LIQUIFILM TEARS) prn for dry eyes Antiseptic oral rinse (BIOTENE) prn for dry mouth  Prognosis:  < 2 weeks   Care plan was discussed with the dyspnea team  Thank you for allowing the Palliative Medicine Team to assist in the care of this patient.   Greater than 50%  of this time was spent counseling and coordinating care related to the above assessment and plan.  Mod MDM  Palliative Medicine   Please contact Palliative Medicine Team phone at [phone] for questions and concerns.  For individual provider, see AMION.

## 2022-03-06 NOTE — Plan of Care (Signed)
  Problem: Pain Managment: Goal: General experience of comfort will improve Outcome: Progressing   Problem: Safety: Goal: Ability to remain free from injury will improve Outcome: Progressing   

## 2022-03-06 NOTE — Progress Notes (Signed)
A consult was placed to the hospital's IV Nurse for new iv access; both arms assessed with ultrasound, with assistance of the tech at the bedside, as pt is crying out and restless; no suitable , compressible veins noted; no attempts made; will notify the RN;

## 2022-03-06 NOTE — Progress Notes (Signed)
[facility] [address] [employer], contracting [facility]) Hospice hospital liaison note  Received request from [facility] for family interest in [facility]. Chart review and IPU eligibility is pending at this time.   Talked with daughter's to acknowledge referral and explain services. Unfortunately [facility] does not have a bed to offer today.   TOC is aware hospital liaison will follow up tomorrow or sooner if room becomes available.   Please do not hesitate to call with any hospice related questions or concerns.   Thank you for the opportunity to participate in this patient's care.  Thea Gist, Charity fundraiser, [facility] Liaison  [phone]

## 2022-03-06 NOTE — Progress Notes (Signed)
Progress Note    Rebecca Barnes  PZW:[id number] DOB: Dec 27, 1945  DOA: 03/03/2022 PCP: Caesar Bookman, NP      Brief Narrative:    Medical records reviewed and are as summarized below:  Rebecca Barnes is a 76 y.o. female  with medical history significant for dementia, CVA with residual aphasia, type 2 diabetes, hypertension, anemia who presents with worsening pain of right hip.   Patient was just admitted from 02/22/2022-02/26/2022 for right hip periprosthetic hip fracture s/p fall.  She underwent revision of right THA by Dr. Linna Caprice on 02/24/2022.  EBL about 1200cc and was transfused 3 units PRBC with stable hemoglobin afterwards and was started on Plavix alone for VTE prophylaxis.  She was started on IV ceftriaxone for inflammatory tissue irritation around the surgical site and was discharged on a week of p.o. cefadroxil.  She came back to the ER with worsening hip pain per nursing home-- patient has dementia and is aphasic.        Assessment/Plan:   Principal Problem:   Postoperative abscess Active Problems:   Type 2 diabetes mellitus without complication, without long-term current use of insulin (HCC)   Essential hypertension   Hypokalemia   History of CVA (cerebrovascular accident)   Expressive aphasia   Morbid obesity (HCC)   Pain   Hip pain   Body mass index is 37.02 kg/m.  (Obesity)   Postoperative seroma s/p revision of right THA by Dr. Linna Caprice on 02/24/2022 following a mechanical fall.  Hypokalemia Hypomagnesemia Chronic anemia Hypertension Type II DM Delirium History of stroke Dementia   PLAN  Continue comfort measures Continue morphine as needed for pain, lorazepam as needed for anxiety and psychotropics as needed for agitation. Follow-up with palliative care team and hospice team  Follow-up with social worker to assist with disposition to hospice house    Diet Order             Diet regular Room service appropriate? Yes; Fluid  consistency: Thin  Diet effective now                            Consultants: Palliative care team  Procedures: None    Medications:    fenofibrate  54 mg Oral Daily   FLUoxetine  40 mg Oral Daily   lidocaine  1 patch Transdermal Q24H   LORazepam  1-2 mg Intravenous Q4H   melatonin  10 mg Oral QHS   OLANZapine zydis  2.5 mg Oral BID   Continuous Infusions:   Anti-infectives (From admission, onward)    Start     Dose/Rate Route Frequency Ordered Stop   03/04/22 2200  vancomycin (VANCOREADY) IVPB 1250 mg/250 mL  Status:  Discontinued        1,250 mg 166.7 mL/hr over 90 Minutes Intravenous Every 24 hours 03/03/22 2225 03/04/22 1526   03/04/22 0400  ceFEPIme (MAXIPIME) 2 g in sodium chloride 0.9 % 100 mL IVPB  Status:  Discontinued        2 g 200 mL/hr over 30 Minutes Intravenous Every 8 hours 03/03/22 2225 03/04/22 1526   03/03/22 2230  vancomycin (VANCOREADY) IVPB 2000 mg/400 mL        2,000 mg 200 mL/hr over 120 Minutes Intravenous  Once 03/03/22 2225 03/04/22 0033   03/03/22 1945  ceFEPIme (MAXIPIME) 2 g in sodium chloride 0.9 % 100 mL IVPB        2 g 200 mL/hr over 30  Minutes Intravenous  Once 03/03/22 1941 03/03/22 2357   03/03/22 1945  vancomycin (VANCOCIN) IVPB 1000 mg/200 mL premix  Status:  Discontinued        1,000 mg 200 mL/hr over 60 Minutes Intravenous  Once 03/03/22 1941 03/03/22 2225   03/03/22 1815  vancomycin (VANCOCIN) IVPB 1000 mg/200 mL premix  Status:  Discontinued        1,000 mg 200 mL/hr over 60 Minutes Intravenous  Once 03/03/22 1800 03/03/22 1951   03/03/22 1815  ceFEPIme (MAXIPIME) 2 g in sodium chloride 0.9 % 100 mL IVPB  Status:  Discontinued        2 g 200 mL/hr over 30 Minutes Intravenous  Once 03/03/22 1813 03/03/22 1951              Family Communication/Anticipated D/C date and plan/Code Status   DVT prophylaxis: SCDs Start: 03/03/22 2144     Code Status: DNR  Family Communication: None Disposition Plan:  Plan to discharge to hospice house   Status is: Inpatient Remains inpatient appropriate because: Awaiting placement to hospice house       Subjective:   Interval events noted.  She is unable to provide any history.  Objective:    Vitals:   03/05/22 1432 03/05/22 2113 03/05/22 2123 03/06/22 0625  BP: (!) 122/53 (!) 127/53 90/61 (!) 154/67  Pulse: 79 84 63 82  Resp: 18 19 18 17   Temp: 98.1 F (36.7 C) 98.6 F (37 C) 97.7 F (36.5 C) 97.8 F (36.6 C)  TempSrc:      SpO2: 96% 96% 96% 90%  Weight:      Height:       No data found.   Intake/Output Summary (Last 24 hours) at 03/06/2022 1147 Last data filed at 03/06/2022 0945 Gross per 24 hour  Intake 462.84 ml  Output 2350 ml  Net -1887.16 ml   Filed Weights   03/03/22 1547 03/04/22 0047  Weight: 97.5 kg 100.9 kg    Exam:  GEN: NAD SKIN: Warm and dry EYES: Anicteric ENT: MMM CV: RRR PULM: CTA B ABD: soft, ND, NT, +BS CNS: Sleepy, non verbal EXT: No edema or tenderness        Data Reviewed:   I have personally reviewed following labs and imaging studies:  Labs: Labs show the following:   Basic Metabolic Panel: Recent Labs  Lab 03/03/22 1546 03/03/22 1607 03/04/22 0342 03/05/22 0345  NA 135 136 137 140  K 2.8* 3.1* 3.6 3.0*  CL 106 101 105 106  CO2 20*  --  24 26  GLUCOSE 189* 204* 115* 112*  BUN 11 9 8  6*  CREATININE 0.46 0.30* 0.42* 0.38*  CALCIUM 8.0*  --  8.3* 8.5*  MG  --   --   --  1.6*   GFR Estimated Creatinine Clearance: 71.6 mL/min (A) (by C-G formula based on SCr of 0.38 mg/dL (L)). Liver Function Tests: Recent Labs  Lab 03/03/22 1546  AST 16  ALT 12  ALKPHOS 73  BILITOT 0.4  PROT 5.5*  ALBUMIN 2.2*   No results for input(s): "LIPASE", "AMYLASE" in the last 168 hours. No results for input(s): "AMMONIA" in the last 168 hours. Coagulation profile No results for input(s): "INR", "PROTIME" in the last 168 hours.  CBC: Recent Labs  Lab 03/03/22 1546  03/03/22 1607 03/04/22 0342 03/05/22 0345  WBC 9.7  --  9.2 9.4  NEUTROABS 6.6  --   --   --   HGB 8.5* 8.2*  8.3* 7.9*  HCT 26.7* 24.0* 26.6* 24.9*  MCV 82.4  --  85.3 82.2  PLT 502*  --  431* 476*   Cardiac Enzymes: No results for input(s): "CKTOTAL", "CKMB", "CKMBINDEX", "TROPONINI" in the last 168 hours. BNP (last 3 results) No results for input(s): "PROBNP" in the last 8760 hours. CBG: Recent Labs  Lab 03/04/22 2108 03/05/22 0732 03/05/22 1113 03/05/22 1643 03/05/22 2113  GLUCAP 127* 124* 130* 136* 152*   D-Dimer: No results for input(s): "DDIMER" in the last 72 hours. Hgb A1c: No results for input(s): "HGBA1C" in the last 72 hours. Lipid Profile: No results for input(s): "CHOL", "HDL", "LDLCALC", "TRIG", "CHOLHDL", "LDLDIRECT" in the last 72 hours. Thyroid function studies: No results for input(s): "TSH", "T4TOTAL", "T3FREE", "THYROIDAB" in the last 72 hours.  Invalid input(s): "FREET3" Anemia work up: No results for input(s): "VITAMINB12", "FOLATE", "FERRITIN", "TIBC", "IRON", "RETICCTPCT" in the last 72 hours. Sepsis Labs: Recent Labs  Lab 03/03/22 1544 03/03/22 1546 03/03/22 1840 03/04/22 0342 03/05/22 0345  WBC  --  9.7  --  9.2 9.4  LATICACIDVEN 3.0*  --  1.6  --   --     Microbiology Recent Results (from the past 240 hour(s))  Blood culture (routine x 2)     Status: None (Preliminary result)   Collection Time: 03/03/22  3:46 PM   Specimen: BLOOD  Result Value Ref Range Status   Specimen Description   Final    BLOOD BLOOD LEFT HAND Performed at [facility], [address] [place], [place] [address]    Special Requests   Final    BOTTLES DRAWN AEROBIC AND ANAEROBIC Blood Culture adequate volume Performed at [facility], [address], [place], [place] [address]    Culture   Final    NO GROWTH 3 DAYS Performed at [facility], [address] [place], [place] [address]    Report Status PENDING   Incomplete  Blood culture (routine x 2)     Status: None (Preliminary result)   Collection Time: 03/03/22  4:01 PM   Specimen: BLOOD  Result Value Ref Range Status   Specimen Description   Final    BLOOD SITE NOT SPECIFIED Performed at [facility], [address] [place] [address], [place], [place] [address]    Special Requests   Final    BOTTLES DRAWN AEROBIC ONLY Blood Culture results may not be optimal due to an excessive volume of blood received in culture bottles Performed at [facility], [address] [place], [place] [address]    Culture   Final    NO GROWTH 3 DAYS Performed at [facility], [address] [place], [place] [address]    Report Status PENDING  Incomplete    Procedures and diagnostic studies:  No results found.             LOS: 2 days   Rogene Meth  [facility]   Pager on [email or web address]. If 7PM-7AM, please contact night-coverage at [email or web address]     03/06/2022, 11:47 AM

## 2022-03-07 DIAGNOSIS — T8149XA Infection following a procedure, other surgical site, initial encounter: Secondary | ICD-10-CM | POA: Diagnosis not present

## 2022-03-07 DIAGNOSIS — Z8673 Personal history of transient ischemic attack (TIA), and cerebral infarction without residual deficits: Secondary | ICD-10-CM | POA: Diagnosis not present

## 2022-03-07 DIAGNOSIS — Z515 Encounter for palliative care: Secondary | ICD-10-CM | POA: Diagnosis not present

## 2022-03-07 DIAGNOSIS — Z7189 Other specified counseling: Secondary | ICD-10-CM | POA: Diagnosis not present

## 2022-03-07 DIAGNOSIS — R52 Pain, unspecified: Secondary | ICD-10-CM | POA: Diagnosis not present

## 2022-03-07 NOTE — Progress Notes (Signed)
PT Cancellation/Discharge from PT Note  Patient Details Name: Katharine Rochefort MRN: [id number] DOB: 07-Oct-1945   Cancelled Treatment:    Reason Eval/Treat Not Completed: Pt is now full comfort care with plan for transfer to [facility]. Will sign off.    Faye Ramsay, PT Acute Rehabilitation  Office: [phone] Pager: [phone]

## 2022-03-07 NOTE — Progress Notes (Signed)
Palliative Medicine Progress Note   Patient Name: Rebecca Barnes       Date: 03/07/2022 DOB: 03-20-1946  Age: 76 y.o. MRN#: [id number] Attending Physician: Lurene Shadow, MD Primary Care Physician: Caesar Bookman, NP Admit Date: 03/03/2022   HPI/Patient Profile:  76 y.o. female  with past medical history of dementia, CVA with residual aphasia, type 2 diabetes, hypertension, and anemia who presented to [facility] ED on 03/03/2022 with worsening pain of right hip.    Patient was recently admitted 02/22/2022 through 02/26/2022 for right hip periprosthetic hip fracture status post fall.  She underwent revision of right THA by Dr. Linna Caprice on 02/24/2022.  CT femur done in the ED showed extensive skin thickening and subcutaneous soft tissue swelling of the thigh suspicious for cellulitis.  There is also a large superficial fluid collection along the anterior proximal thigh that could be abscess or postoperative seroma/hematoma.   Patient seen by ortho, who felt that patient had mild expected postop swelling to thigh.   Subjective: Patient is resting in bed, appears weak, no family present at bedside, appears in no acute distress currently      Objective:  Physical Exam Vitals reviewed.  Constitutional:      General: She is not in acute distress.    Appearance: She is ill-appearing.  Pulmonary:     Effort: Pulmonary effort is normal.  Neurological:     Mental Status: She is alert. She is confused.     Motor: Weakness present.  Psychiatric:        Mood and Affect: Mood is anxious.             Vital Signs: BP (!) 153/67 (BP Location: Left Arm)   Pulse 86   Temp 97.8 F (36.6 C)   Resp 18   Ht 5\' 5"  (1.651 m)   Wt 100.9 kg   SpO2 100%   BMI 37.02 kg/m  SpO2: SpO2: 100 % O2 Device: O2  Device: Room Air O2 Flow Rate:      LBM: Last BM Date : 03/04/22     Palliative Assessment/Data: PPS 20%     Palliative Medicine Assessment & Plan   Assessment: Principal Problem:   Postoperative abscess Active Problems:   Type 2 diabetes mellitus without complication, without long-term current use of insulin (HCC)   Essential  hypertension   Hypokalemia   History of CVA (cerebrovascular accident)   Expressive aphasia   Morbid obesity (HCC)   Pain   Hip pain    Recommendations/Plan: Full comfort measures initiated DNR/DNI as previously documented Transfer to [facility] when bed becomes available - TOC consult placed Continue symptom management at EOL as well as discontinued orders that were not focused on comfort  scheduled Ativan 1 to 2 mg IV every 4 hours PMT will continue to follow  Awaiting bed at local hospice. Chart reviewed.  Symptom Management:  Morphine prn for pain or dyspnea Lorazepam (ATIVAN) prn for anxiety Haloperidol (HALDOL) prn for agitation  Glycopyrrolate (ROBINUL) for excessive secretions Ondansetron (ZOFRAN) prn for nausea Polyvinyl alcohol (LIQUIFILM TEARS) prn for dry eyes Antiseptic oral rinse (BIOTENE) prn for dry mouth  Prognosis:  < 2 weeks   Care plan was discussed with IDT  Thank you for allowing the Palliative Medicine Team to assist in the care of this patient.   Greater than 50%  of this time was spent counseling and coordinating care related to the above assessment and plan.  Mod MDM  Palliative Medicine   Please contact Palliative Medicine Team phone at [phone] for questions and concerns.  For individual provider, see AMION.

## 2022-03-07 NOTE — TOC Progression Note (Signed)
Transition of Care [facility]) - Progression Note    Patient Details  Name: Rebecca Barnes MRN: [id number] Date of Birth: [birth year]  Transition of Care [facility]) CM/SW Contact  Darleene Cleaver, [place] Phone Number: 03/07/2022, 4:47 PM  Clinical Narrative:     Patient has been approved for [facility], awaiting bed availability.  CSW updated Tresa Endo at [facility].  CSW to continue to follow patient's progress throughout discharge planning.   Expected Discharge Plan: Skilled Nursing Facility Barriers to Discharge: Continued Medical Work up  Expected Discharge Plan and Services Expected Discharge Plan: Skilled Nursing Facility In-house Referral: Clinical Social Work   Post Acute Care Choice: Skilled Nursing Facility Living arrangements for the past 2 months: Skilled Nursing Facility                 DME Arranged: N/A DME Agency: NA                   Social Determinants of Health (SDOH) Interventions    Readmission Risk Interventions    02/26/2022   12:08 PM  Readmission Risk Prevention Plan  Transportation Screening Complete  Medication Review [facility]) Complete  PCP or Specialist appointment within 3-5 days of discharge Complete  HRI or Home Care Consult Complete  SW Recovery Care/Counseling Consult Complete  Palliative Care Screening Not Applicable  Skilled Nursing Facility Complete

## 2022-03-07 NOTE — Plan of Care (Signed)

## 2022-03-07 NOTE — Progress Notes (Signed)
24 hour chart audit completed 

## 2022-03-07 NOTE — Progress Notes (Addendum)
Progress Note    Rebecca Barnes  [id number] DOB: 1945/12/07  DOA: 03/03/2022 PCP: Sandrea Hughs, NP      Brief Narrative:    Medical records reviewed and are as summarized below:  Rebecca Barnes is a 76 y.o. female  with medical history significant for dementia, CVA with residual aphasia, type 2 diabetes, hypertension, anemia who presents with worsening pain of right hip.   Patient was just admitted from 02/22/2022-02/26/2022 for right hip periprosthetic hip fracture s/p fall.  She underwent revision of right THA by Dr. Lyla Glassing on 02/24/2022.  EBL about 1200cc and was transfused 3 units PRBC with stable hemoglobin afterwards and was started on Plavix alone for VTE prophylaxis.  She was started on IV ceftriaxone for inflammatory tissue irritation around the surgical site and was discharged on a week of p.o. cefadroxil.  She came back to the ER with worsening hip pain per nursing home-- patient has dementia and is aphasic.        Assessment/Plan:   Principal Problem:   Postoperative abscess Active Problems:   Type 2 diabetes mellitus without complication, without long-term current use of insulin (HCC)   Essential hypertension   Hypokalemia   History of CVA (cerebrovascular accident)   Expressive aphasia   Morbid obesity (HCC)   Pain   Hip pain   Body mass index is 37.02 kg/m.  (Obesity)   Postoperative seroma s/p revision of right THA by Dr. Lyla Glassing on 02/24/2022 following a mechanical fall.  Hypokalemia Hypomagnesemia Chronic anemia Hypertension Type II DM Delirium History of stroke Dementia   PLAN  Continue comfort measures She she is not getting any IV medications because she lost her IV access and there was difficulty obtaining a new peripheral line. Awaiting placement to hospice home    Diet Order             Diet regular Room service appropriate? Yes; Fluid consistency: Thin  Diet effective now                             Consultants: Palliative care team  Procedures: None    Medications:    fenofibrate  54 mg Oral Daily   FLUoxetine  40 mg Oral Daily   lidocaine  1 patch Transdermal Q24H   melatonin  10 mg Oral QHS   OLANZapine zydis  2.5 mg Oral BID   Continuous Infusions:   Anti-infectives (From admission, onward)    Start     Dose/Rate Route Frequency Ordered Stop   03/04/22 2200  vancomycin (VANCOREADY) IVPB 1250 mg/250 mL  Status:  Discontinued        1,250 mg 166.7 mL/hr over 90 Minutes Intravenous Every 24 hours 03/03/22 2225 03/04/22 1526   03/04/22 0400  ceFEPIme (MAXIPIME) 2 g in sodium chloride 0.9 % 100 mL IVPB  Status:  Discontinued        2 g 200 mL/hr over 30 Minutes Intravenous Every 8 hours 03/03/22 2225 03/04/22 1526   03/03/22 2230  vancomycin (VANCOREADY) IVPB 2000 mg/400 mL        2,000 mg 200 mL/hr over 120 Minutes Intravenous  Once 03/03/22 2225 03/04/22 0033   03/03/22 1945  ceFEPIme (MAXIPIME) 2 g in sodium chloride 0.9 % 100 mL IVPB        2 g 200 mL/hr over 30 Minutes Intravenous  Once 03/03/22 1941 03/03/22 2357   03/03/22 1945  vancomycin (VANCOCIN) IVPB 1000  mg/200 mL premix  Status:  Discontinued        1,000 mg 200 mL/hr over 60 Minutes Intravenous  Once 03/03/22 1941 03/03/22 2225   03/03/22 1815  vancomycin (VANCOCIN) IVPB 1000 mg/200 mL premix  Status:  Discontinued        1,000 mg 200 mL/hr over 60 Minutes Intravenous  Once 03/03/22 1800 03/03/22 1951   03/03/22 1815  ceFEPIme (MAXIPIME) 2 g in sodium chloride 0.9 % 100 mL IVPB  Status:  Discontinued        2 g 200 mL/hr over 30 Minutes Intravenous  Once 03/03/22 1813 03/03/22 1951              Family Communication/Anticipated D/C date and plan/Code Status   DVT prophylaxis: SCDs Start: 03/03/22 2144     Code Status: DNR  Family Communication: None Disposition Plan: Plan to discharge to hospice house   Status is: Inpatient Remains inpatient appropriate  because: Awaiting placement to hospice house       Subjective:   Interval events noted.  She is resting comfortably.  Objective:    Vitals:   03/05/22 2113 03/05/22 2123 03/06/22 0625 03/07/22 0635  BP: (!) 127/53 90/61 (!) 154/67 (!) 153/67  Pulse: 84 63 82 86  Resp: 19 18 17 18   Temp: 98.6 F (37 C) 97.7 F (36.5 C) 97.8 F (36.6 C)   TempSrc:      SpO2: 96% 96% 90% 100%  Weight:      Height:       No data found.   Intake/Output Summary (Last 24 hours) at 03/07/2022 1228 Last data filed at 03/07/2022 1136 Gross per 24 hour  Intake 1580 ml  Output 1800 ml  Net -220 ml   Filed Weights   03/03/22 1547 03/04/22 0047  Weight: 97.5 kg 100.9 kg    Exam:  GEN: NAD SKIN: Warm and dry EYES: Anicteric ENT: MMM CV: RRR PULM: CTA B ABD: soft, obese, NT, +BS CNS: Drowsy, not easily aroused EXT: No edema          Data Reviewed:   I have personally reviewed following labs and imaging studies:  Labs: Labs show the following:   Basic Metabolic Panel: Recent Labs  Lab 03/03/22 1546 03/03/22 1607 03/04/22 0342 03/05/22 0345  NA 135 136 137 140  K 2.8* 3.1* 3.6 3.0*  CL 106 101 105 106  CO2 20*  --  24 26  GLUCOSE 189* 204* 115* 112*  BUN 11 9 8  6*  CREATININE 0.46 0.30* 0.42* 0.38*  CALCIUM 8.0*  --  8.3* 8.5*  MG  --   --   --  1.6*   GFR Estimated Creatinine Clearance: 71.6 mL/min (A) (by C-G formula based on SCr of 0.38 mg/dL (L)). Liver Function Tests: Recent Labs  Lab 03/03/22 1546  AST 16  ALT 12  ALKPHOS 73  BILITOT 0.4  PROT 5.5*  ALBUMIN 2.2*   No results for input(s): "LIPASE", "AMYLASE" in the last 168 hours. No results for input(s): "AMMONIA" in the last 168 hours. Coagulation profile No results for input(s): "INR", "PROTIME" in the last 168 hours.  CBC: Recent Labs  Lab 03/03/22 1546 03/03/22 1607 03/04/22 0342 03/05/22 0345  WBC 9.7  --  9.2 9.4  NEUTROABS 6.6  --   --   --   HGB 8.5* 8.2* 8.3* 7.9*  HCT 26.7*  24.0* 26.6* 24.9*  MCV 82.4  --  85.3 82.2  PLT 502*  --  431* 476*   Cardiac Enzymes: No results for input(s): "CKTOTAL", "CKMB", "CKMBINDEX", "TROPONINI" in the last 168 hours. BNP (last 3 results) No results for input(s): "PROBNP" in the last 8760 hours. CBG: Recent Labs  Lab 03/04/22 2108 03/05/22 0732 03/05/22 1113 03/05/22 1643 03/05/22 2113  GLUCAP 127* 124* 130* 136* 152*   D-Dimer: No results for input(s): "DDIMER" in the last 72 hours. Hgb A1c: No results for input(s): "HGBA1C" in the last 72 hours. Lipid Profile: No results for input(s): "CHOL", "HDL", "LDLCALC", "TRIG", "CHOLHDL", "LDLDIRECT" in the last 72 hours. Thyroid function studies: No results for input(s): "TSH", "T4TOTAL", "T3FREE", "THYROIDAB" in the last 72 hours.  Invalid input(s): "FREET3" Anemia work up: No results for input(s): "VITAMINB12", "FOLATE", "FERRITIN", "TIBC", "IRON", "RETICCTPCT" in the last 72 hours. Sepsis Labs: Recent Labs  Lab 03/03/22 1544 03/03/22 1546 03/03/22 1840 03/04/22 0342 03/05/22 0345  WBC  --  9.7  --  9.2 9.4  LATICACIDVEN 3.0*  --  1.6  --   --     Microbiology Recent Results (from the past 240 hour(s))  Blood culture (routine x 2)     Status: None (Preliminary result)   Collection Time: 03/03/22  3:46 PM   Specimen: BLOOD  Result Value Ref Range Status   Specimen Description   Final    BLOOD BLOOD LEFT HAND Performed at [facility], [address] [place], [place] [address]    Special Requests   Final    BOTTLES DRAWN AEROBIC AND ANAEROBIC Blood Culture adequate volume Performed at [facility], [address] [place], [place] [address]    Culture   Final    NO GROWTH 4 DAYS Performed at [facility], [address] [place], [place] [address]    Report Status PENDING  Incomplete  Blood culture (routine x 2)     Status: None (Preliminary result)   Collection Time: 03/03/22  4:01 PM   Specimen: BLOOD   Result Value Ref Range Status   Specimen Description   Final    BLOOD SITE NOT SPECIFIED Performed at [facility], [address] [place], [place] [address]    Special Requests   Final    BOTTLES DRAWN AEROBIC ONLY Blood Culture results may not be optimal due to an excessive volume of blood received in culture bottles Performed at [facility], [address], [place], [place] [address]    Culture   Final    NO GROWTH 4 DAYS Performed at [facility], [address], [place], [place] [address]    Report Status PENDING  Incomplete    Procedures and diagnostic studies:  No results found.             LOS: 3 days   Taitum Menton  [facility]   Pager on [email or web address]. If 7PM-7AM, please contact night-coverage at [email or web address]     03/07/2022, 12:28 PM

## 2022-03-07 NOTE — Progress Notes (Signed)
[facility]) Hospice hospital liaison note  Patient referred to [facility] and eligibility has been confirmed.   Unfortunately [facility] does not have a bed to offer today.   TOC is aware and hospital liaison will follow tomorrow or earlier if a bed becomes available.   Thanks for the opportunity to participate in this patient's care Thea Gist, BSN, RN Hospice hospital liaison [phone]

## 2022-03-08 DIAGNOSIS — T8149XA Infection following a procedure, other surgical site, initial encounter: Secondary | ICD-10-CM | POA: Diagnosis not present

## 2022-03-08 LAB — CULTURE, BLOOD (ROUTINE X 2)
Culture: NO GROWTH
Culture: NO GROWTH
Special Requests: ADEQUATE

## 2022-03-08 MED ORDER — GLYCOPYRROLATE 1 MG PO TABS: 1.0000 mg | ORAL_TABLET | ORAL | Status: DC | PRN

## 2022-03-08 MED ORDER — LORAZEPAM 2 MG PO TABS: 2.0000 mg | ORAL_TABLET | ORAL | 0 refills | Status: DC | PRN

## 2022-03-08 MED ORDER — MORPHINE SULFATE (CONCENTRATE) 10 MG/0.5ML PO SOLN
5.0000 mg | ORAL | 0 refills | Status: DC | PRN
Start: 2022-03-08 — End: 2022-12-03

## 2022-03-08 MED ORDER — HALOPERIDOL 0.5 MG PO TABS: 0.5000 mg | ORAL_TABLET | ORAL | Status: DC | PRN

## 2022-03-08 MED ORDER — POLYVINYL ALCOHOL 1.4 % OP SOLN: 1.0000 [drp] | Freq: Four times a day (QID) | OPHTHALMIC | 0 refills | Status: DC | PRN

## 2022-03-08 MED ORDER — OLANZAPINE 5 MG PO TBDP: 2.5000 mg | ORAL_TABLET | Freq: Two times a day (BID) | ORAL | Status: DC

## 2022-03-08 NOTE — Progress Notes (Signed)
Discharge instructions reviewed with facility nurse Dawn at [facility]. All questions concerns addressed. Aware that patient will be coming via [facility]. Patients legal guardian aware of discharge to facility.

## 2022-03-08 NOTE — Progress Notes (Signed)
[facility]) Hospice hospital liaison note  Patient is referred to [facility] for end of life care, and eligibility is confirmed.   A bed is available today and consents are in process.   Nurse can call report at any time to [phone], bed is assigned at that time.   Thank you for the opportunity to participate in this patient's care.  Thea Gist, BSN, RN Hospice hospital liaison [phone]

## 2022-03-08 NOTE — Discharge Summary (Signed)
Physician Discharge Summary  Rebecca Barnes JHE:[id number] DOB: April 30, 1946 DOA: 03/03/2022  PCP: Caesar Bookman, NP  Admit date: 03/03/2022 Discharge date: 03/08/2022  Admitted From: snf Discharge disposition: hospice   Recommendations for Outpatient Follow-Up:   To residential hospice   Discharge Diagnosis:   Principal Problem:   Postoperative abscess Active Problems:   Type 2 diabetes mellitus without complication, without long-term current use of insulin (HCC)   Essential hypertension   Hypokalemia   History of CVA (cerebrovascular accident)   Expressive aphasia   Morbid obesity (HCC)   Pain   Hip pain     Code status: DNR   History of Present Illness:   Rebecca Barnes is a 76 y.o. female with medical history significant of dementia, CVA with residual aphasia, type 2 diabetes, hypertension, anemia who presents with worsening pain of right hip.   Patient was just admitted from 02/22/2022-02/26/2022 for right hip periprosthetic hip fracture s/p fall.  She underwent revision of right THA by Dr. Linna Caprice on 02/24/2022.  EBL about 1200cc and was transfused 3 units PRBC with stable hemoglobin afterwards and was started on Plavix alone for VTE prophylaxis.  She was started on IV ceftriaxone for inflammatory tissue irritation around the surgical site and was discharged on a week of p.o. cefadroxil.   Repeat CT femur today showed extensive skin thickening and subcutaneous soft tissue swelling of the thigh likely cellulitis.  There is also large superficial fluid collection along the anterior proximal thigh that could be abscess or postoperative seroma/hematoma. There is also coalescent fluid along the posterior lateral incision and the majority of the IT band with a similar differential as above.     ED physician discussed with orthopedic Dr Odis Hollingshead and given the lack of fever or leukocytosis doubt infection but agree with continuing IV antibiotics and they will see in  consultation in the morning.  No definitive plans for intervention at this time. Hemoglobin is stable at 8.5 with previous hemoglobin of 9.7.   Hospital Course by Problem:   * Postoperative seroma vs abscess -suspected seroma- no sign of infection S/p underwent revision of right THA by Dr. Linna Caprice on 02/24/2022 following a mechanical fall. -allegedly Presents with worsening pain of right hip and repeat CT femur today showed extensive skin thickening and subcutaneous soft tissue swelling of the thigh.  There is also large superficial fluid collection along the anterior proximal thigh that could be abscess or postoperative seroma/hematoma. There is also coalescent fluid along the posterior lateral incision and the majority of the IT band with a similar differential as above.   -transitioned to comfort care   Morbid obesity (HCC) .Estimated body mass index is 37.02 kg/m as calculated from the following:   Height as of this encounter: 5\' 5"  (1.651 m).   Weight as of this encounter: 100.9 kg.    History of CVA (cerebrovascular accident) -transitioned to comfort care   Hypokalemia Essential hypertension Type 2 diabetes mellitus without complication, without long-term current use of insulin (HCC) Delirium  Anemia -transitioned to comfort care    Medical Consultants:   Palliative care ortho   Discharge Exam:   Vitals:   03/07/22 2148 03/08/22 0504  BP: (!) 103/51 (!) 141/58  Pulse: 96 73  Resp: 18   Temp: 98 F (36.7 C)   SpO2: 99% 92%   Vitals:   03/06/22 0625 03/07/22 0635 03/07/22 2148 03/08/22 0504  BP: (!) 154/67 (!) 153/67 (!) 103/51 (!) 141/58  Pulse: 82  86 96 73  Resp: 17 18 18    Temp: 97.8 F (36.6 C)  98 F (36.7 C)   TempSrc:   Oral   SpO2: 90% 100% 99% 92%  Weight:      Height:        General exam: Appears calm and comfortable.     The results of significant diagnostics from this hospitalization (including imaging, microbiology, ancillary and  laboratory) are listed below for reference.     Procedures and Diagnostic Studies:   CT FEMUR RIGHT W CONTRAST  Result Date: 03/03/2022 CLINICAL DATA:  Soft tissue infection suspected, thigh, xray done R thigh swelling. recent hip revision surgery r/o osteo vs fracture vs soft swelling infection EXAM: CT OF THE LOWER RIGHT EXTREMITY WITH CONTRAST TECHNIQUE: Multidetector CT imaging of the lower right extremity was performed according to the standard protocol following intravenous contrast administration. RADIATION DOSE REDUCTION: This exam was performed according to the departmental dose-optimization program which includes automated exposure control, adjustment of the mA and/or kV according to patient size and/or use of iterative reconstruction technique. CONTRAST:  186mL OMNIPAQUE IOHEXOL 300 MG/ML  SOLN COMPARISON:  None Available. FINDINGS: Bones/Joint/Cartilage Postsurgical changes of recent arthroplasty revision with new long stem right hip arthroplasty and cerclage wire fixation for a periprosthetic fracture. Near anatomic fracture alignment. Intact hardware without evidence of loosening. There is no bony erosion or frank bony destruction. Probable trace joint effusion. There is tricompartment osteoarthritis of the knee with severe medial compartment joint space narrowing. Small right knee joint effusion. Ligaments Suboptimally assessed by CT. Muscles and Tendons There is a fluid collection on the anterior compartment of the thigh, tracking towards the hip joint. This communicates with a superficial rim enhancing collection along the anterior proximal thigh which measures up to 8.6 x 4.0 x 16.3 cm (series 7, image 59, series 12, image 61). Soft tissues Posterolateral incision with underlying coalescent fluid along the superficial surface of the IT band, extending from the hip to the level of the distal femur, measuring 4.3 x 2.3 cm. This demonstrates no clear rim enhancement. There is extensive skin  thickening and subcutaneous soft tissue swelling of the right thigh. IMPRESSION: Postsurgical changes of recent right hip revision with long stem arthroplasty and cerclage wire fixation for a periprosthetic fracture. Near anatomic fracture alignment. Intact hardware without evidence of loosening. Extensive skin thickening and subcutaneous soft tissue swelling of the thigh, as can be seen in cellulitis. Large superficial fluid collection with rim enhancement along the anterior proximal thigh measuring 8.6 x 4.0 cm in the axial plane and up to 6.3 cm and proximal-distal extent, with probable communicating fluid coursing deep towards the right hip joint, with punctate foci of gas. This could represent abscess or postoperative seroma/hematoma. Posterolateral incision with additional underlying coalescent fluid along the majority of the IT band, measuring 4.3 x 2.3 cm in the axial plane, without clear rim enhancement. Differential is the same as above but felt less likely to be infectious given no definitive rim enhancement. 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 Small right knee joint effusion. Electronically Signed   By: Maurine Simmering M.D.   On: 03/03/2022 17:15   CT HEAD WO CONTRAST (5MM)  Result Date: 03/03/2022 CLINICAL DATA:  Mental status change, unknown cause EXAM: CT HEAD WITHOUT CONTRAST TECHNIQUE: Contiguous axial images were obtained from the base of the skull through the vertex without intravenous contrast. RADIATION DOSE REDUCTION: This exam was performed according to the departmental dose-optimization program  which includes automated exposure control, adjustment of the mA and/or kV according to patient size and/or use of iterative reconstruction technique. COMPARISON:  CT head 02/23/2022. FINDINGS: Brain: Redemonstrated left posterior MCA territory infarct with encephalomalacia predominantly in the left parietal and temporal lobes. Additional patchy white matter  hypoattenuation, nonspecific but compatible with chronic microvascular ischemic disease. No evidence of acute large vascular territory infarct, acute hemorrhage, mass lesion midline shift, or hydrocephalus. Cerebral atrophy. Vascular: No hyperdense vessel identified. Calcific atherosclerosis. Skull: No acute fracture. Sinuses/Orbits: Clear sinuses.  No acute orbital findings. Other: No mastoid effusions. IMPRESSION: 1. No evidence of acute intracranial abnormality. 2. Remote left MCA territory infarct and chronic microvascular ischemic disease. Electronically Signed   By: Margaretha Sheffield M.D.   On: 03/03/2022 16:59   DG Chest Port 1 View  Result Date: 03/03/2022 CLINICAL DATA:  Altered mental status. EXAM: PORTABLE CHEST 1 VIEW COMPARISON:  02/22/2022 FINDINGS: Numerous leads and wires project over the chest. Midline trachea. Borderline cardiomegaly. The Chin overlies the apices minimally. No pleural effusion or pneumothorax. Clear lungs. IMPRESSION: No acute cardiopulmonary disease. Electronically Signed   By: Abigail Miyamoto M.D.   On: 03/03/2022 16:15     Labs:   Basic Metabolic Panel: Recent Labs  Lab 03/03/22 1546 03/03/22 1607 03/04/22 0342 03/05/22 0345  NA 135 136 137 140  K 2.8* 3.1* 3.6 3.0*  CL 106 101 105 106  CO2 20*  --  24 26  GLUCOSE 189* 204* 115* 112*  BUN 11 9 8  6*  CREATININE 0.46 0.30* 0.42* 0.38*  CALCIUM 8.0*  --  8.3* 8.5*  MG  --   --   --  1.6*   GFR Estimated Creatinine Clearance: 71.6 mL/min (A) (by C-G formula based on SCr of 0.38 mg/dL (L)). Liver Function Tests: Recent Labs  Lab 03/03/22 1546  AST 16  ALT 12  ALKPHOS 73  BILITOT 0.4  PROT 5.5*  ALBUMIN 2.2*   No results for input(s): "LIPASE", "AMYLASE" in the last 168 hours. No results for input(s): "AMMONIA" in the last 168 hours. Coagulation profile No results for input(s): "INR", "PROTIME" in the last 168 hours.  CBC: Recent Labs  Lab 03/03/22 1546 03/03/22 1607 03/04/22 0342  03/05/22 0345  WBC 9.7  --  9.2 9.4  NEUTROABS 6.6  --   --   --   HGB 8.5* 8.2* 8.3* 7.9*  HCT 26.7* 24.0* 26.6* 24.9*  MCV 82.4  --  85.3 82.2  PLT 502*  --  431* 476*   Cardiac Enzymes: No results for input(s): "CKTOTAL", "CKMB", "CKMBINDEX", "TROPONINI" in the last 168 hours. BNP: Invalid input(s): "POCBNP" CBG: Recent Labs  Lab 03/04/22 2108 03/05/22 0732 03/05/22 1113 03/05/22 1643 03/05/22 2113  GLUCAP 127* 124* 130* 136* 152*   D-Dimer No results for input(s): "DDIMER" in the last 72 hours. Hgb A1c No results for input(s): "HGBA1C" in the last 72 hours. Lipid Profile No results for input(s): "CHOL", "HDL", "LDLCALC", "TRIG", "CHOLHDL", "LDLDIRECT" in the last 72 hours. Thyroid function studies No results for input(s): "TSH", "T4TOTAL", "T3FREE", "THYROIDAB" in the last 72 hours.  Invalid input(s): "FREET3" Anemia work up No results for input(s): "VITAMINB12", "FOLATE", "FERRITIN", "TIBC", "IRON", "RETICCTPCT" in the last 72 hours. Microbiology Recent Results (from the past 240 hour(s))  Blood culture (routine x 2)     Status: None (Preliminary result)   Collection Time: 03/03/22  3:46 PM   Specimen: BLOOD  Result Value Ref Range Status   Specimen Description  Final    BLOOD BLOOD LEFT HAND Performed at [facility], [address] [place], [place] [address]    Special Requests   Final    BOTTLES DRAWN AEROBIC AND ANAEROBIC Blood Culture adequate volume Performed at [facility], [address], [place], [place] [address]    Culture   Final    NO GROWTH 4 DAYS Performed at [facility], [address] [place], [place] [address]    Report Status PENDING  Incomplete  Blood culture (routine x 2)     Status: None (Preliminary result)   Collection Time: 03/03/22  4:01 PM   Specimen: BLOOD  Result Value Ref Range Status   Specimen Description   Final    BLOOD SITE NOT SPECIFIED Performed at [facility], [address], [place], [place] [address]    Special Requests   Final    BOTTLES DRAWN AEROBIC ONLY Blood Culture results may not be optimal due to an excessive volume of blood received in culture bottles Performed at [facility], [address] [place], [place] [address]    Culture   Final    NO GROWTH 4 DAYS Performed at [facility], [address] [place], [place] [address]    Report Status PENDING  Incomplete     Discharge Instructions:   Discharge Instructions     No wound care   Complete by: As directed       Allergies as of 03/08/2022       Reactions   Januvia [sitagliptin] Other (See Comments)   dizziness   Statins Other (See Comments)   Hospitalized on 2 occasions after trying statins. Patient became dizzy and increased fall risk   Sulfa Antibiotics Other (See Comments)   Childhood Allergy  Unknown reaction        Medication List     STOP taking these medications    amLODipine 5 MG tablet Commonly known as: NORVASC   camphor-menthol lotion Commonly known as: SARNA   cefadroxil 500 MG capsule Commonly known as: DURICEF   clonazePAM 0.5 MG tablet Commonly known as: KLONOPIN   clopidogrel 75 MG tablet Commonly known as: PLAVIX   cyanocobalamin 1000 MCG tablet   hydrOXYzine 10 MG tablet Commonly known as: ATARAX   metFORMIN 500 MG tablet Commonly known as: GLUCOPHAGE   naloxone 0.4 MG/ML injection Commonly known as: NARCAN   Nuedexta 20-10 MG capsule Generic drug: Dextromethorphan-quiNIDine   NUTRITIONAL SUPPLEMENT PO   oxyCODONE 5 MG immediate release tablet Commonly known as: Roxicodone   oxymetazoline 0.05 % nasal spray Commonly known as: Afrin Nasal Spray   QUEtiapine 25 MG tablet Commonly known as: SEROQUEL   senna-docusate 8.6-50 MG tablet Commonly known as: Senokot-S   sodium chloride 1 g tablet   sodium phosphate 7-19 GM/118ML Enem       TAKE these medications     acetaminophen 325 MG tablet Commonly known as: TYLENOL Take 650 mg by mouth every 6 (six) hours as needed for mild pain or headache.   docusate sodium 100 MG capsule Commonly known as: COLACE Take 1 capsule (100 mg total) by mouth 2 (two) times daily.   fenofibrate 54 MG tablet TAKE 1 TABLET(54 MG) BY MOUTH DAILY What changed: See the new instructions.   FLUoxetine 40 MG capsule Commonly known as: PROZAC Take one capsule by mouth once daily. What changed:  how much to take how to take this when to  take this additional instructions   glycopyrrolate 1 MG tablet Commonly known as: ROBINUL Take 1 tablet (1 mg total) by mouth every 4 (four) hours as needed (excessive secretions).   haloperidol 0.5 MG tablet Commonly known as: HALDOL Take 1 tablet (0.5 mg total) by mouth every 4 (four) hours as needed for agitation (or delirium).   lidocaine 4 % Place 1 patch onto the skin daily.   LORazepam 2 MG tablet Commonly known as: ATIVAN Take 1 tablet (2 mg total) by mouth every 4 (four) hours as needed for anxiety.   Melatonin 10 MG Tabs Take 10 mg by mouth at bedtime.   morphine CONCENTRATE 10 MG/0.5ML Soln concentrated solution Take 0.25 mLs (5 mg total) by mouth every 3 (three) hours as needed for severe pain, moderate pain or shortness of breath.   OLANZapine zydis 5 MG disintegrating tablet Commonly known as: ZYPREXA Take 0.5 tablets (2.5 mg total) by mouth 2 (two) times daily.   ondansetron 4 MG tablet Commonly known as: ZOFRAN Take 1 tablet (4 mg total) by mouth every 6 (six) hours as needed for nausea.   polyvinyl alcohol 1.4 % ophthalmic solution Commonly known as: LIQUIFILM TEARS Place 1 drop into both eyes 4 (four) times daily as needed for dry eyes.        Follow-up Information     Swinteck, Arlys John, MD. Schedule an appointment as soon as possible for a visit on 03/15/2022.   Specialty: Orthopedic Surgery Why: For wound re-check, For suture removal Contact  information: [address] [place] [address] [phone]                  Time coordinating discharge: 35 min  Signed:  Joseph Art DO  [facility] 03/08/2022, 9:38 AM

## 2022-03-08 NOTE — TOC Transition Note (Signed)
Transition of Care [facility]) - CM/SW Discharge Note   Patient Details  Name: Rebecca Barnes MRN: [id number] Date of Birth: [birth year]  [facility] of [facility]) CM/SW Contact:  Amada Jupiter, LCSW Phone Number: 03/08/2022, 11:03 AM   Clinical Narrative:    Alerted that bed now ready at [facility] today and pt/ family agreeable with transfer today.  PTAR called at 11:00am.  RN to call report to [phone].  No further TOC needs.   Final next level of care: Hospice Medical Facility Barriers to Discharge: Barriers Resolved   Patient Goals and CMS Choice        Discharge Placement                Patient to be transferred to facility by: PTAR Name of family member notified: daughter, [name] Patient and family notified of of transfer: 03/08/22  Discharge Plan and Services In-house Referral: Clinical Social Work   Post Acute Care Choice: Residential Hospice Facility          DME Arranged: N/A DME Agency: NA                  Social Determinants of Health (SDOH) Interventions     Readmission Risk Interventions    02/26/2022   12:08 PM  Readmission Risk Prevention Plan  Transportation Screening Complete  Medication Review [employer]) Complete  PCP or Specialist appointment within 3-5 days of discharge Complete  HRI or Home Care Consult Complete  SW Recovery Care/Counseling Consult Complete  Palliative Care Screening Not Applicable  Skilled Nursing Facility Complete

## 2022-03-09 ENCOUNTER — Telehealth: Payer: Self-pay

## 2022-03-09 NOTE — Telephone Encounter (Signed)
Transition Care Management Unsuccessful Follow-up Telephone Call  Date of discharge and from where:  [facility] 03/08/2022  Attempts:  1st Attempt  Reason for unsuccessful TCM follow-up call:  Unable to reach patient, patient was released into Residential Hospice Facility [facility]) on 03/08/2022. Nothing else follows at this time

## 2022-03-23 DEATH — deceased

## 2022-04-05 ENCOUNTER — Encounter
Payer: Medicare Other | Attending: Physical Medicine and Rehabilitation | Admitting: Physical Medicine and Rehabilitation

## 2022-06-09 ENCOUNTER — Ambulatory Visit: Payer: Medicare Other | Admitting: Family
